# Patient Record
Sex: Female | Born: 1940 | Race: White | Hispanic: No | Smoking: Never smoker
Health system: Southern US, Community
[De-identification: ages and names within clinical notes are randomized; demographics above are authoritative.]

## PROBLEM LIST (undated history)

## (undated) DIAGNOSIS — J45991 Cough variant asthma: Secondary | ICD-10-CM

## (undated) DIAGNOSIS — IMO0002 Reserved for concepts with insufficient information to code with codable children: Secondary | ICD-10-CM

## (undated) DIAGNOSIS — I2510 Atherosclerotic heart disease of native coronary artery without angina pectoris: Secondary | ICD-10-CM

## (undated) DIAGNOSIS — L719 Rosacea, unspecified: Secondary | ICD-10-CM

## (undated) DIAGNOSIS — I10 Essential (primary) hypertension: Secondary | ICD-10-CM

## (undated) DIAGNOSIS — R011 Cardiac murmur, unspecified: Secondary | ICD-10-CM

## (undated) DIAGNOSIS — J309 Allergic rhinitis, unspecified: Secondary | ICD-10-CM

## (undated) DIAGNOSIS — D509 Iron deficiency anemia, unspecified: Secondary | ICD-10-CM

## (undated) DIAGNOSIS — E785 Hyperlipidemia, unspecified: Secondary | ICD-10-CM

## (undated) DIAGNOSIS — M1990 Unspecified osteoarthritis, unspecified site: Secondary | ICD-10-CM

## (undated) DIAGNOSIS — R51 Headache: Secondary | ICD-10-CM

## (undated) DIAGNOSIS — Z9889 Other specified postprocedural states: Secondary | ICD-10-CM

## (undated) DIAGNOSIS — M722 Plantar fascial fibromatosis: Secondary | ICD-10-CM

## (undated) DIAGNOSIS — I639 Cerebral infarction, unspecified: Secondary | ICD-10-CM

## (undated) DIAGNOSIS — J189 Pneumonia, unspecified organism: Secondary | ICD-10-CM

## (undated) DIAGNOSIS — N189 Chronic kidney disease, unspecified: Secondary | ICD-10-CM

## (undated) DIAGNOSIS — K219 Gastro-esophageal reflux disease without esophagitis: Secondary | ICD-10-CM

## (undated) DIAGNOSIS — R112 Nausea with vomiting, unspecified: Secondary | ICD-10-CM

## (undated) DIAGNOSIS — G4730 Sleep apnea, unspecified: Secondary | ICD-10-CM

## (undated) HISTORY — DX: Allergic rhinitis, unspecified: J30.9

## (undated) HISTORY — DX: Gastro-esophageal reflux disease without esophagitis: K21.9

## (undated) HISTORY — DX: Unspecified osteoarthritis, unspecified site: M19.90

## (undated) HISTORY — DX: Cough variant asthma: J45.991

## (undated) HISTORY — PX: MULTIPLE TOOTH EXTRACTIONS: SHX2053

## (undated) HISTORY — DX: Plantar fascial fibromatosis: M72.2

## (undated) HISTORY — PX: BLADDER REPAIR: SHX76

## (undated) HISTORY — PX: TONSILLECTOMY: SUR1361

## (undated) HISTORY — PX: BLADDER SUSPENSION: SHX72

## (undated) HISTORY — DX: Rosacea, unspecified: L71.9

---

## 1989-08-22 HISTORY — PX: ABDOMINAL HYSTERECTOMY: SHX81

## 2000-04-22 ENCOUNTER — Encounter: Admission: RE | Admit: 2000-04-22 | Discharge: 2000-04-22 | Payer: Self-pay | Admitting: Internal Medicine

## 2000-04-22 ENCOUNTER — Encounter: Payer: Self-pay | Admitting: Internal Medicine

## 2003-02-03 ENCOUNTER — Encounter: Payer: Self-pay | Admitting: Internal Medicine

## 2003-02-03 ENCOUNTER — Encounter: Admission: RE | Admit: 2003-02-03 | Discharge: 2003-02-03 | Payer: Self-pay | Admitting: Internal Medicine

## 2005-02-07 ENCOUNTER — Ambulatory Visit (HOSPITAL_COMMUNITY): Admission: RE | Admit: 2005-02-07 | Discharge: 2005-02-07 | Payer: Self-pay | Admitting: Gastroenterology

## 2008-09-28 ENCOUNTER — Other Ambulatory Visit: Admission: RE | Admit: 2008-09-28 | Discharge: 2008-09-28 | Payer: Self-pay | Admitting: Obstetrics and Gynecology

## 2012-11-05 ENCOUNTER — Emergency Department (HOSPITAL_COMMUNITY)
Admission: EM | Admit: 2012-11-05 | Discharge: 2012-11-06 | Disposition: A | Discharge status: Home / Self Care | Payer: Medicare Other | Attending: Emergency Medicine | Admitting: Emergency Medicine

## 2012-11-05 ENCOUNTER — Encounter (HOSPITAL_COMMUNITY): Payer: Self-pay | Admitting: Emergency Medicine

## 2012-11-05 DIAGNOSIS — M549 Dorsalgia, unspecified: Secondary | ICD-10-CM | POA: Insufficient documentation

## 2012-11-05 DIAGNOSIS — G4730 Sleep apnea, unspecified: Secondary | ICD-10-CM | POA: Insufficient documentation

## 2012-11-05 DIAGNOSIS — Z79899 Other long term (current) drug therapy: Secondary | ICD-10-CM | POA: Insufficient documentation

## 2012-11-05 DIAGNOSIS — I10 Essential (primary) hypertension: Secondary | ICD-10-CM | POA: Insufficient documentation

## 2012-11-05 DIAGNOSIS — E785 Hyperlipidemia, unspecified: Secondary | ICD-10-CM | POA: Insufficient documentation

## 2012-11-05 DIAGNOSIS — E860 Dehydration: Secondary | ICD-10-CM | POA: Insufficient documentation

## 2012-11-05 DIAGNOSIS — N23 Unspecified renal colic: Secondary | ICD-10-CM | POA: Insufficient documentation

## 2012-11-05 DIAGNOSIS — IMO0002 Reserved for concepts with insufficient information to code with codable children: Secondary | ICD-10-CM | POA: Insufficient documentation

## 2012-11-05 DIAGNOSIS — R112 Nausea with vomiting, unspecified: Secondary | ICD-10-CM | POA: Insufficient documentation

## 2012-11-05 DIAGNOSIS — N201 Calculus of ureter: Secondary | ICD-10-CM | POA: Insufficient documentation

## 2012-11-05 HISTORY — DX: Reserved for concepts with insufficient information to code with codable children: IMO0002

## 2012-11-05 HISTORY — DX: Hyperlipidemia, unspecified: E78.5

## 2012-11-05 HISTORY — DX: Sleep apnea, unspecified: G47.30

## 2012-11-05 HISTORY — DX: Essential (primary) hypertension: I10

## 2012-11-05 LAB — URINALYSIS, MICROSCOPIC ONLY
Bilirubin Urine: NEGATIVE
Ketones, ur: NEGATIVE mg/dL
Nitrite: NEGATIVE
pH: 6.5 (ref 5.0–8.0)

## 2012-11-05 LAB — COMPREHENSIVE METABOLIC PANEL
ALT: 25 U/L (ref 0–35)
Alkaline Phosphatase: 50 U/L (ref 39–117)
BUN: 30 mg/dL — ABNORMAL HIGH (ref 6–23)
Chloride: 106 mEq/L (ref 96–112)
GFR calc Af Amer: 52 mL/min — ABNORMAL LOW (ref >90)
Glucose, Bld: 151 mg/dL — ABNORMAL HIGH (ref 70–99)
Potassium: 4.4 mEq/L (ref 3.5–5.1)
Total Bilirubin: 0.3 mg/dL (ref 0.3–1.2)

## 2012-11-05 LAB — CBC WITH DIFFERENTIAL/PLATELET
Hemoglobin: 13.8 g/dL (ref 12.0–15.0)
Lymphocytes Relative: 10 % — ABNORMAL LOW (ref 12–46)
Lymphs Abs: 1.2 10*3/uL (ref 0.7–4.0)
Monocytes Relative: 3 % (ref 3–12)
Neutro Abs: 11.2 10*3/uL — ABNORMAL HIGH (ref 1.7–7.7)
Neutrophils Relative %: 87 % — ABNORMAL HIGH (ref 43–77)
RBC: 4.55 MIL/uL (ref 3.87–5.11)
WBC: 12.9 10*3/uL — ABNORMAL HIGH (ref 4.0–10.5)

## 2012-11-05 LAB — LIPASE, BLOOD: Lipase: 33 U/L (ref 11–59)

## 2012-11-05 MED ORDER — SODIUM CHLORIDE 0.9 % IV BOLUS (SEPSIS)
1000.0000 mL | Freq: Once | INTRAVENOUS | Status: AC
  Administered 2012-11-05: 1000 mL via INTRAVENOUS

## 2012-11-05 MED ORDER — IOHEXOL 300 MG/ML  SOLN
20.0000 mL | INTRAMUSCULAR | Status: AC
  Administered 2012-11-05: 20 mL via ORAL

## 2012-11-05 MED ORDER — FENTANYL CITRATE 0.05 MG/ML IJ SOLN
50.0000 ug | Freq: Once | INTRAMUSCULAR | Status: AC
  Administered 2012-11-06: 50 ug via INTRAVENOUS
  Filled 2012-11-05: qty 2

## 2012-11-05 MED ORDER — FENTANYL CITRATE 0.05 MG/ML IJ SOLN
50.0000 ug | Freq: Once | INTRAMUSCULAR | Status: AC
  Administered 2012-11-05: 50 ug via INTRAVENOUS
  Filled 2012-11-05: qty 2

## 2012-11-05 MED ORDER — SODIUM CHLORIDE 0.9 % IV SOLN
Freq: Once | INTRAVENOUS | Status: AC
  Administered 2012-11-06: 20 mL/h via INTRAVENOUS

## 2012-11-05 MED ORDER — ONDANSETRON 4 MG PO TBDP
8.0000 mg | ORAL_TABLET | Freq: Once | ORAL | Status: AC
  Administered 2012-11-05: 8 mg via ORAL
  Filled 2012-11-05: qty 1
  Filled 2012-11-05: qty 2

## 2012-11-05 NOTE — ED Provider Notes (Signed)
History     CSN: [id number]  Arrival date & time 11/05/12  2024   First MD Initiated Contact with Patient 11/05/12 2256      Chief Complaint  Patient presents with  . Abdominal Pain     Patient is a 71 y.o. female presenting with abdominal pain. The history is provided by the patient.  Abdominal Pain The primary symptoms of the illness include abdominal pain, nausea and vomiting. The primary symptoms of the illness do not include fever, shortness of breath, diarrhea or dysuria. The current episode started 6 to 12 hours ago. The onset of the illness was gradual. The problem has been gradually worsening.  Additional symptoms associated with the illness include back pain.  PT presents for diffuse abdominal pain that started earlier today.  She has not experienced this previously.  She reports nausea/vomiting.  No active CP.  She reports back pain.  No dysuria.  No focal weakness.  She reports she passed a small bowel movement earlier today   Past Medical History  Diagnosis Date  . Degenerative disc disease   . Sleep apnea   . Hypertension   . Hyperlipemia     Past Surgical History  Procedure Date  . Abdominal hysterectomy   . Bladder repair     History reviewed. No pertinent family history.  History  Substance Use Topics  . Smoking status: Never Smoker   . Smokeless tobacco: Not on file  . Alcohol Use: No    OB History    Grav Para Term Preterm Abortions TAB SAB Ect Mult Living                  Review of Systems  Constitutional: Negative for fever.  Respiratory: Negative for shortness of breath.   Gastrointestinal: Positive for nausea, vomiting and abdominal pain. Negative for diarrhea.  Genitourinary: Negative for dysuria.  Musculoskeletal: Positive for back pain.  Neurological: Negative for weakness.  All other systems reviewed and are negative.    Allergies  Sulfa antibiotics  Home Medications   Current Outpatient Rx  Name  Route  Sig  Dispense   Refill  . CALCIUM 600 PO   Oral   Take 1 tablet by mouth daily.         Marland Kitchen CETIRIZINE HCL 10 MG PO TABS   Oral   Take 10 mg by mouth daily.         Marland Kitchen VITAMIN D3 PO   Oral   Take 800 Units by mouth daily.         . FENOFIBRATE 48 MG PO TABS   Oral   Take 48 mg by mouth daily.         . ADULT MULTIVITAMIN W/MINERALS CH   Oral   Take 1 tablet by mouth daily.         Marland Kitchen NAPROXEN SODIUM 220 MG PO TABS   Oral   Take 440 mg by mouth 2 (two) times daily with a meal.         . GNP TRIPLE OMEGA COMPLEX PO CPDR   Oral   Take 1 tablet by mouth daily.         Marland Kitchen OVER THE COUNTER MEDICATION   Oral   Take 1 tablet by mouth daily. OTC Mega D-3         . OVER THE COUNTER MEDICATION   Oral   Take 1 tablet by mouth daily. Osteo Bi-Flex         .  OVER THE COUNTER MEDICATION   Oral   Take 1 tablet by mouth daily. [employer] Joint vita pack         . SIMVASTATIN 40 MG PO TABS   Oral   Take 40 mg by mouth daily.         Marland Kitchen VITAMIN A PALMITATE 10000 UNITS PO CAPS   Oral   Take 10,000 Units by mouth daily.           BP 191/82  Pulse 88  Temp 98.3 F (36.8 C) (Oral)  Resp 22  SpO2 98% BP 153/66  Pulse 73  Temp 98.3 F (36.8 C) (Oral)  Resp 16  SpO2 100%  Physical Exam CONSTITUTIONAL: Well developed/well nourished HEAD AND FACE: Normocephalic/atraumatic EYES: EOMI/PERRL ENMT: Mucous membranes moist NECK: supple no meningeal signs SPINE:entire spine nontender CV: S1/S2 noted, no murmurs/rubs/gallops noted LUNGS: Lungs are clear to auscultation bilaterally, no apparent distress ABDOMEN: soft, distended, diffuse tenderness, decreased BS throughout, tenderness is moderate GU:no cva tenderness NEURO: Pt is awake/alert, moves all extremitiesx4 EXTREMITIES: pulses normal, full ROM SKIN: warm, color normal PSYCH: no abnormalities of mood noted  ED Course  Procedures   Labs Reviewed  CBC WITH DIFFERENTIAL - Abnormal; Notable for the following:    WBC 12.9  (*)     Neutrophils Relative 87 (*)     Neutro Abs 11.2 (*)     Lymphocytes Relative 10 (*)     All other components within normal limits  COMPREHENSIVE METABOLIC PANEL - Abnormal; Notable for the following:    Glucose, Bld 151 (*)     BUN 30 (*)     Creatinine, Ser 1.20 (*)     GFR calc non Af Amer 45 (*)     GFR calc Af Amer 52 (*)     All other components within normal limits  URINALYSIS, MICROSCOPIC ONLY - Abnormal; Notable for the following:    Hgb urine dipstick LARGE (*)     Protein, ur 30 (*)     Leukocytes, UA TRACE (*)     All other components within normal limits  LIPASE, BLOOD   11:18 PM Pt presents for diffuse abdominal pain today, will obtain CT imaging 2:19 AM Pt improved.  Ct imaging notes distal 5mm stone. She feels improved and will be discharged home.  Discussed need for followup.  Pt agreeable with plan  MDM  Nursing notes including past medical history and social history reviewed and considered in documentation Labs/vital reviewed and considered        Date: 11/05/2012  Rate: 87  Rhythm: normal sinus rhythm  QRS Axis: normal  Intervals: normal  ST/T Wave abnormalities: nonspecific ST changes  Conduction Disutrbances:none     Joya Gaskins, MD 11/06/12 [phone]

## 2012-11-05 NOTE — ED Notes (Addendum)
Lisa [name] (brother) [phone] (home) and [phone] (mobile) Lisa [name] (mother) [phone] (home) Pt sent wallet home with her mother.

## 2012-11-05 NOTE — ED Notes (Addendum)
Pt states she has abdominal pain since noon today.  Pt. Has N/V but denies diarrhea.  Pain radiates back and sides bilaterally.  Pt. Went to [facility] in clinic on [address] and received a shot of what she thinks was phenergan and referred her to come here because of increased pain. No urinary changes/ denies pain or burning with urination.  Because of urinary sling, pt. Always has urinary frequency/no changes in color/odor of urine.

## 2012-11-06 ENCOUNTER — Emergency Department (HOSPITAL_COMMUNITY): Payer: Medicare Other

## 2012-11-06 ENCOUNTER — Encounter (HOSPITAL_COMMUNITY): Payer: Self-pay | Admitting: Radiology

## 2012-11-06 IMAGING — CT CT ABD-PELV W/ CM
2 of 5 series · 14 of 32 positions shown, 19 images · IV contrast (omnipaque)
Comparison: None.

CLINICAL DATA: Abdominal pain

CT ABDOMEN AND PELVIS WITH CONTRAST
TECHNIQUE: Multidetector CT imaging of the abdomen and pelvis was
performed following the standard protocol during bolus
administration of intravenous contrast.
Contrast: 80mL OMNIPAQUE IOHEXOL 300 MG/ML  SOLN

[Series 2: routine abdomen · axial · 0.81mm/px · z∈[-420,-90]mm · 6 of 94 slices shown, 11 images]
[im 14/94  soft-tissue]
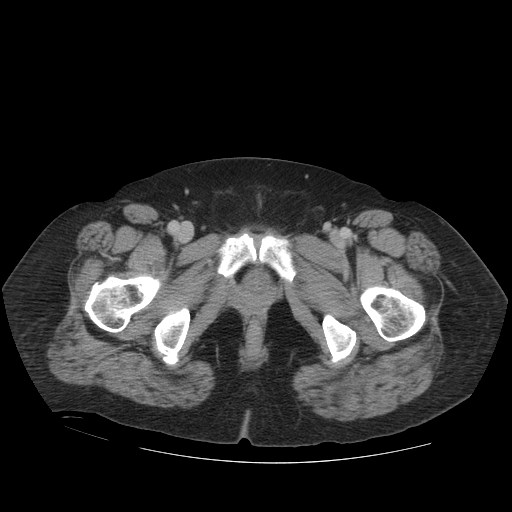
[im 14/94  bone]
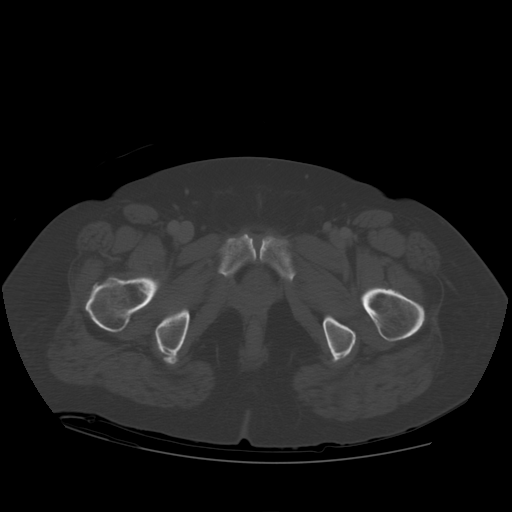
[im 27/94  soft-tissue]
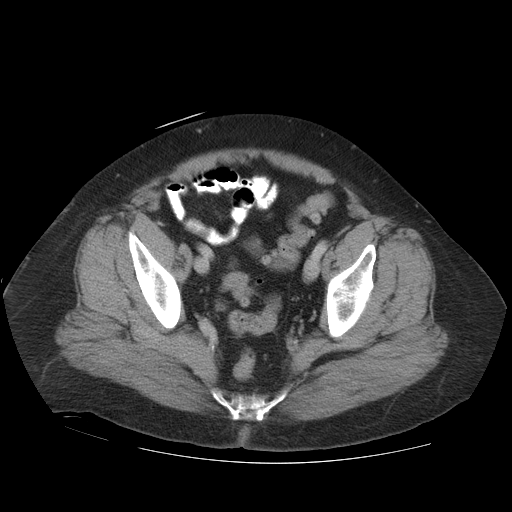
[im 40/94  soft-tissue]
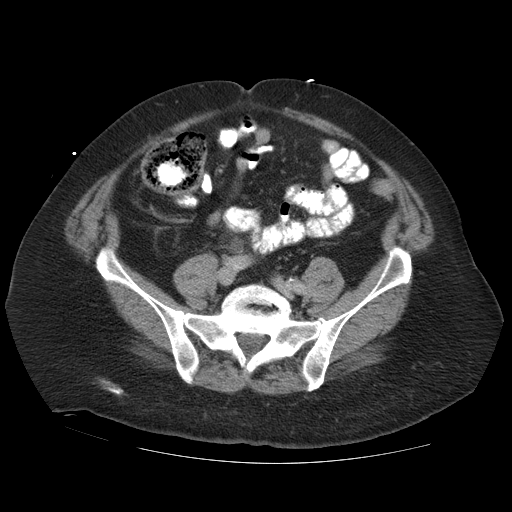
[im 40/94  lung]
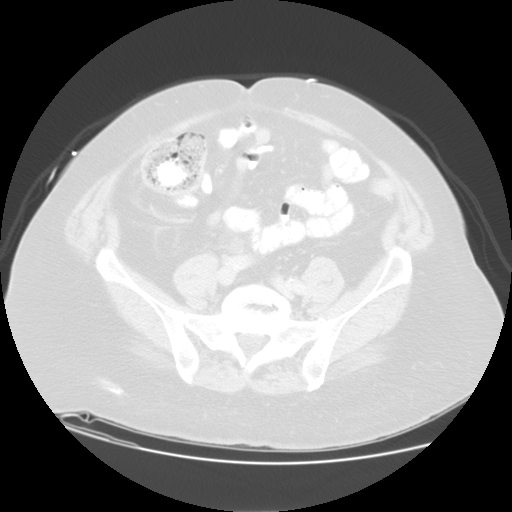
[im 54/94  soft-tissue]
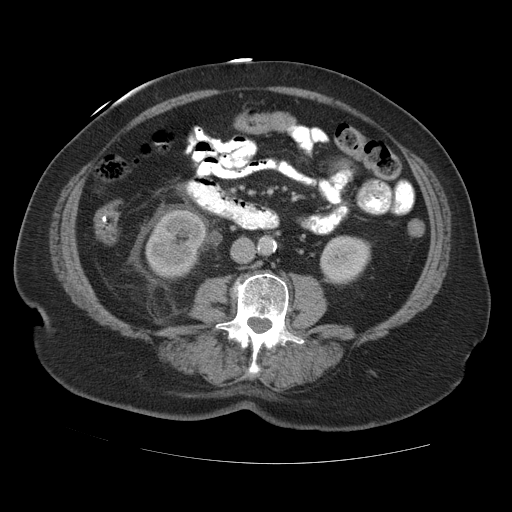
[im 54/94  lung]
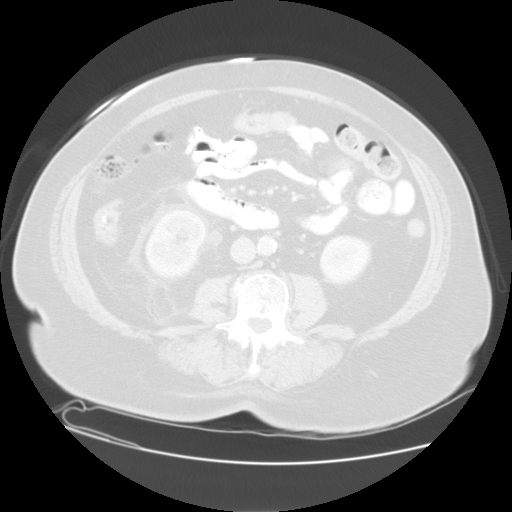
[im 67/94  soft-tissue]
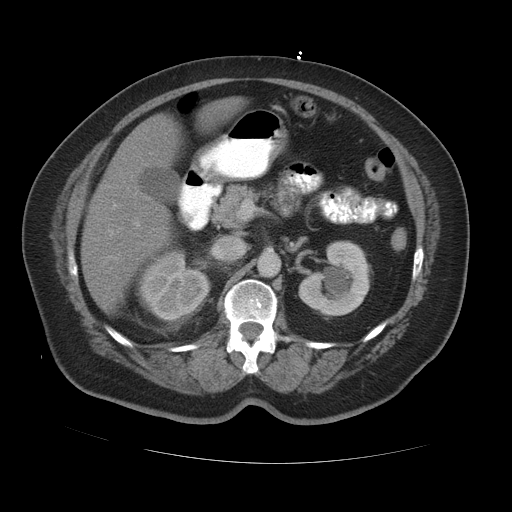
[im 67/94  lung]
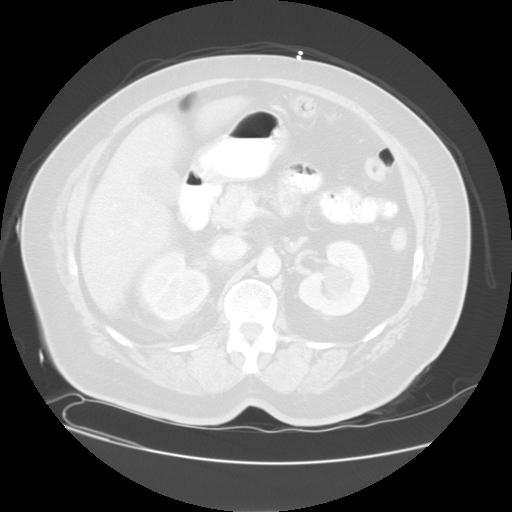
[im 80/94  soft-tissue]
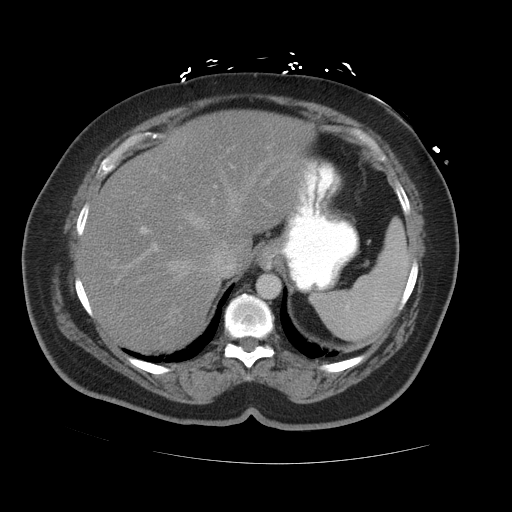
[im 80/94  lung]
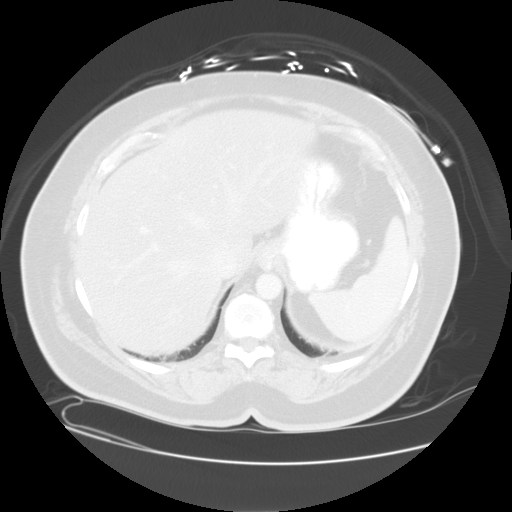

[Series 400: sag · sagittal · 0.93mm/px · 8 of 125 slices shown]
[im 14/125  soft-tissue]
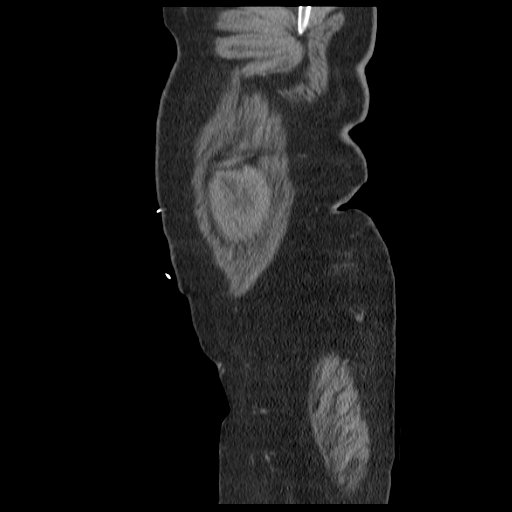
[im 28/125  soft-tissue]
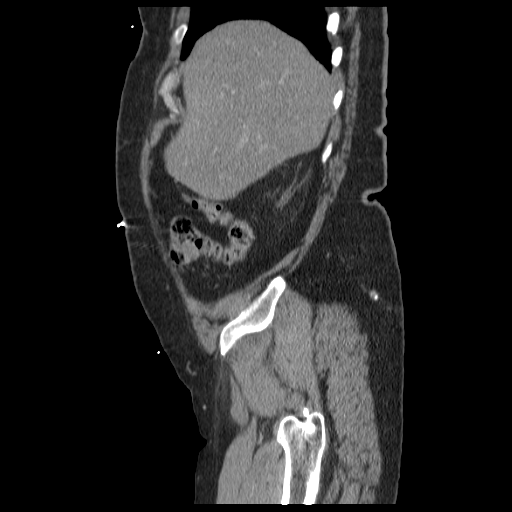
[im 42/125  soft-tissue]
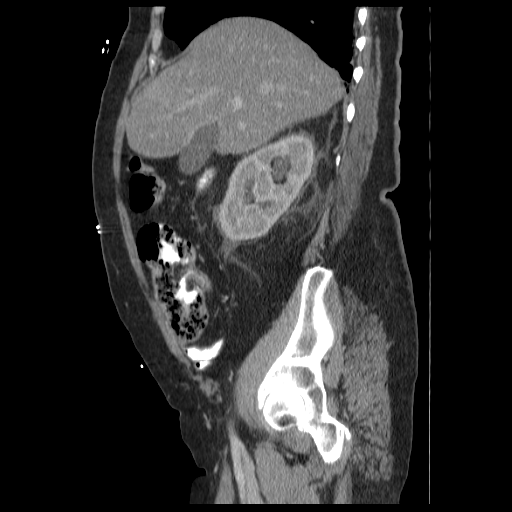
[im 56/125  soft-tissue]
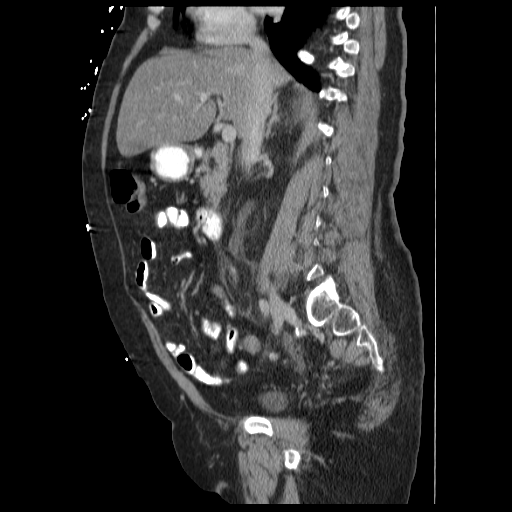
[im 69/125  soft-tissue]
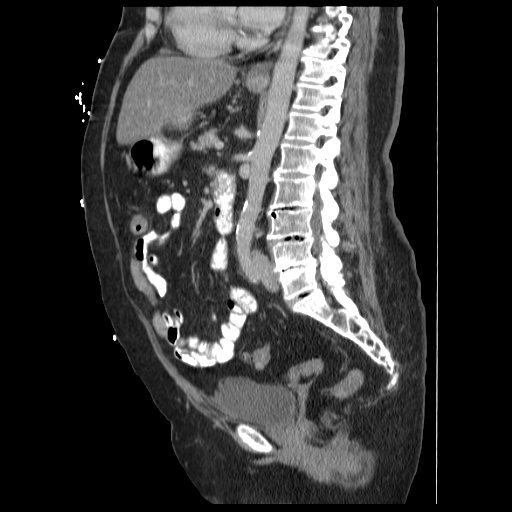
[im 83/125  soft-tissue]
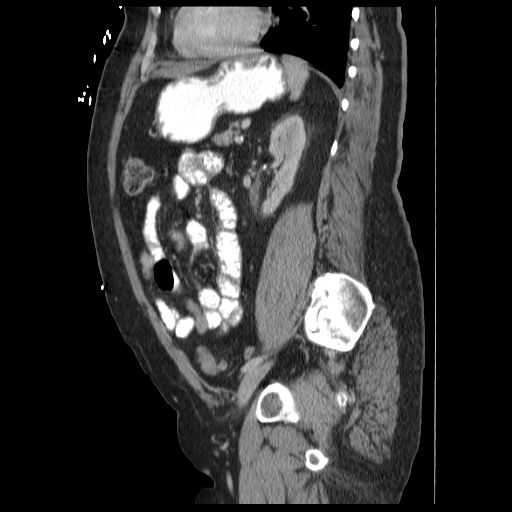
[im 97/125  soft-tissue]
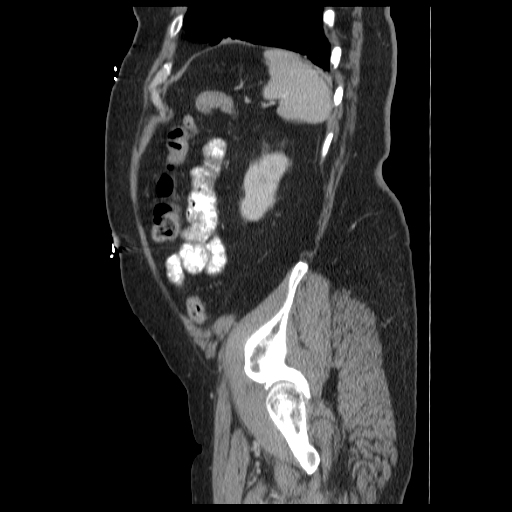
[im 111/125  soft-tissue]
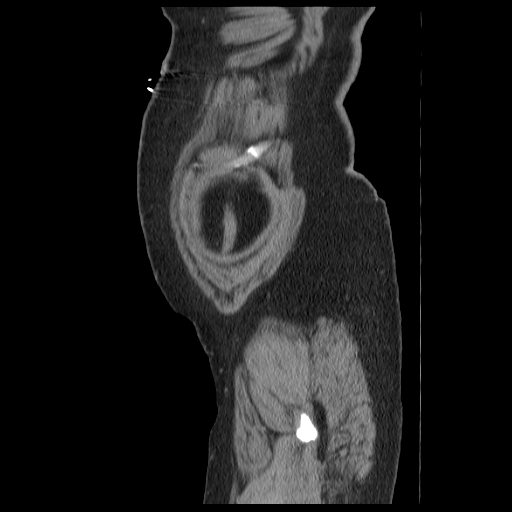

[14 of 32 positions shown; findings below may reference images not displayed]

FINDINGS: Mild bibasilar scarring versus atelectasis.  Heart size
within normal limits.  No pleural or pericardial effusion.

Diffuse low attenuation of the liver.  Unremarkable biliary system,
spleen, pancreas, adrenal glands.

There is right renal edema and perinephric/periureteral fat
stranding.  Moderate hydroureteronephrosis to the level of a 5 mm
stone at or just past the UVJ, within the bladder.

There is upper pole caliectasis on the left, to the level of a 12
mm stone.  Additional nonobstructing stone within the upper pole on
the left.

No bowel obstruction.  Colonic diverticulosis.  No CT evidence for
diverticulitis or colitis.  Appendix not identified.  No free
intraperitoneal air or fluid.

There is scattered atherosclerotic calcification of the aorta and
its branches. No aneurysmal dilatation.

Thin-walled bladder. Absent uterus.  No adnexal mass.  No pelvic
lymphadenopathy.

Multilevel degenerative changes of the imaged spine. No acute or
aggressive appearing osseous lesion.  Severe central canal
narrowing at L4-5.
IMPRESSION: Moderate right hydroureteronephrosis to the level of a 5 mm stone
at the UVJ or just past the UVJ, within the bladder.

Moderate upper pole caliectasis on the left to the level of a 12 mm
pelvic stone.

Additional nonobstructing upper pole stone on the left.

Colonic diverticulosis.

Hepatic steatosis.

## 2012-11-06 MED ORDER — ONDANSETRON 8 MG PO TBDP: 8.0000 mg | ORAL_TABLET | Freq: Three times a day (TID) | ORAL | Status: DC | PRN

## 2012-11-06 MED ORDER — OXYCODONE-ACETAMINOPHEN 5-325 MG PO TABS: 1.0000 | ORAL_TABLET | ORAL | Status: DC | PRN

## 2012-11-06 MED ORDER — IOHEXOL 300 MG/ML  SOLN
80.0000 mL | Freq: Once | INTRAMUSCULAR | Status: AC | PRN
  Administered 2012-11-06: 80 mL via INTRAVENOUS

## 2012-11-06 NOTE — ED Notes (Signed)
Called CDU to advised that patient has finished contrast

## 2012-11-06 NOTE — ED Notes (Signed)
Pt denies nausea and vomiting at this time / pt denies having diarrhea

## 2012-11-06 NOTE — ED Notes (Signed)
  Report to CDU Rn and going to Rm 5

## 2012-11-10 ENCOUNTER — Other Ambulatory Visit: Payer: Self-pay | Admitting: Urology

## 2012-11-12 ENCOUNTER — Encounter (HOSPITAL_COMMUNITY): Payer: Self-pay | Admitting: *Deleted

## 2012-11-12 NOTE — Pre-Procedure Instructions (Signed)
Asked to bring blue folder the day of the procedure,insurance card,I.D. driver's license,wear comfortable clothing and have a driver for the day. Asked not to take Advil,Motrin,Ibuprofen,Aleve or any NSAIDS, Aspirin, or Toradol for 72 hours prior to procedure,  No vitamins or herbal medications 7 days prior to procedure. Stopping Napaxen and all vitamins. Instructed to take laxative per doctor's office instructions and eat a light dinner the evening before procedure.   To arrive at 1415 for lithotripsy procedure.  Bringing cpap with mask and tubing.

## 2012-11-14 MED ORDER — CIPROFLOXACIN HCL 500 MG PO TABS
500.0000 mg | ORAL_TABLET | ORAL | Status: AC
  Administered 2012-11-15: 500 mg via ORAL
  Filled 2012-11-14: qty 1

## 2012-11-15 ENCOUNTER — Encounter (HOSPITAL_COMMUNITY)
Admission: RE | Disposition: A | Discharge status: Home / Self Care | Payer: Self-pay | Source: Ambulatory Visit | Attending: Urology

## 2012-11-15 ENCOUNTER — Ambulatory Visit (HOSPITAL_COMMUNITY)
Admission: RE | Admit: 2012-11-15 | Discharge: 2012-11-15 | Disposition: A | Discharge status: Home / Self Care | Payer: Medicare Other | Source: Ambulatory Visit | Attending: Urology | Admitting: Urology

## 2012-11-15 ENCOUNTER — Encounter (HOSPITAL_COMMUNITY): Payer: Self-pay | Admitting: *Deleted

## 2012-11-15 DIAGNOSIS — N200 Calculus of kidney: Secondary | ICD-10-CM | POA: Insufficient documentation

## 2012-11-15 DIAGNOSIS — N1330 Unspecified hydronephrosis: Secondary | ICD-10-CM | POA: Insufficient documentation

## 2012-11-15 DIAGNOSIS — Z7901 Long term (current) use of anticoagulants: Secondary | ICD-10-CM | POA: Insufficient documentation

## 2012-11-15 DIAGNOSIS — K219 Gastro-esophageal reflux disease without esophagitis: Secondary | ICD-10-CM | POA: Insufficient documentation

## 2012-11-15 DIAGNOSIS — I10 Essential (primary) hypertension: Secondary | ICD-10-CM | POA: Insufficient documentation

## 2012-11-15 DIAGNOSIS — J45909 Unspecified asthma, uncomplicated: Secondary | ICD-10-CM | POA: Insufficient documentation

## 2012-11-15 DIAGNOSIS — G4730 Sleep apnea, unspecified: Secondary | ICD-10-CM | POA: Insufficient documentation

## 2012-11-15 DIAGNOSIS — E785 Hyperlipidemia, unspecified: Secondary | ICD-10-CM | POA: Insufficient documentation

## 2012-11-15 HISTORY — DX: Other specified postprocedural states: Z98.890

## 2012-11-15 HISTORY — DX: Chronic kidney disease, unspecified: N18.9

## 2012-11-15 HISTORY — DX: Nausea with vomiting, unspecified: R11.2

## 2012-11-15 SURGERY — LITHOTRIPSY, ESWL
Anesthesia: LOCAL | Laterality: Left

## 2012-11-15 MED ORDER — DEXTROSE-NACL 5-0.45 % IV SOLN
INTRAVENOUS | Status: DC
  Administered 2012-11-15: 16:00:00 via INTRAVENOUS

## 2012-11-15 MED ORDER — DIAZEPAM 5 MG PO TABS
10.0000 mg | ORAL_TABLET | ORAL | Status: AC
  Administered 2012-11-15: 10 mg via ORAL
  Filled 2012-11-15: qty 2

## 2012-11-15 MED ORDER — ONDANSETRON HCL 4 MG/2ML IJ SOLN
4.0000 mg | Freq: Once | INTRAMUSCULAR | Status: AC
  Administered 2012-11-15: 4 mg via INTRAVENOUS
  Filled 2012-11-15: qty 2

## 2012-11-15 MED ORDER — DIPHENHYDRAMINE HCL 25 MG PO CAPS
25.0000 mg | ORAL_CAPSULE | ORAL | Status: AC
  Administered 2012-11-15: 25 mg via ORAL
  Filled 2012-11-15: qty 1

## 2012-11-15 MED ORDER — PROMETHAZINE HCL 25 MG/ML IJ SOLN
6.2500 mg | Freq: Four times a day (QID) | INTRAMUSCULAR | Status: DC | PRN
  Administered 2012-11-15: 6.25 mg via INTRAVENOUS
  Filled 2012-11-15: qty 1

## 2012-11-15 NOTE — Interval H&P Note (Signed)
History and Physical Interval Note:  11/15/2012 5:00 PM  [name]  has presented today for surgery, with the diagnosis of Left renal pelvic stone  The various methods of treatment have been discussed with the patient and family. After consideration of risks, benefits and other options for treatment, the patient has consented to  Procedure(s) (LRB) with comments: EXTRACORPOREAL SHOCK WAVE LITHOTRIPSY (ESWL) (Left) as a surgical intervention .  The patient's history has been reviewed, patient examined, no change in status, stable for surgery.  I have reviewed the patient's chart and labs.  Questions were answered to the patient's satisfaction.     Lisa [name] I

## 2012-11-15 NOTE — Progress Notes (Signed)
Patient denies using any advil, ibuprofen, toradol or any blood thinners for the last 72 hours.

## 2012-11-15 NOTE — H&P (Signed)
cc:  Lisa [name]   Reason For Visit  Kidney stone   Active Problems Problems  1. 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  Eventual R flank pain. CT scan showed a 5mm Rt UVJ stone that was possible already within the bladder.  She has not seen any stone pass in her urine.     She is now asymptomatic, except for slight nausea with fatigue. This is her 1st stone episode. She was drinking 1 soda/day, but is now drinking 1 qod. She is a retired Agricultural consultant.   Past Medical History Problems  1. History of  Adult Sleep Apnea 780.57 2. History of  Arthritis V13.4 3. History of  Asthma 493.90 4. History of  Depression 311 5. History of  Esophageal Reflux 530.81 6. History of  Hyperlipidemia 272.4 7. History of  Hypertension 401.9  Surgical History Problems  1. History of  Bladder Surgery 2. History of  Total Abdominal Hysterectomy V45.77  Current Meds 1. Calcium 600 TABS; Therapy: (Recorded:20Nov2013) to 2. Cetirizine HCl 10 MG Oral Tablet; Therapy: (Recorded:20Nov2013) to 3. Fenofibrate 48 MG Oral Tablet; Therapy: (Recorded:20Nov2013) to 4. Multi-Vitamin TABS; Therapy: (Recorded:20Nov2013) to 5. Naproxen Sodium 220 MG Oral Tablet; Therapy: (Recorded:20Nov2013) to 6. Omega-3 CAPS; Therapy: (Recorded:20Nov2013) to 7. Simvastatin 40 MG Oral Tablet; Therapy: (Recorded:20Nov2013) to 8. Vitamin A 04540 UNIT Oral Capsule; Therapy: (Recorded:20Nov2013) to 9. Vitamin D3 TABS; Therapy: (Recorded:20Nov2013) to  Allergies Medication  1. Sulfa Drugs  Family History Problems  1. Paternal history of  Acute Myocardial Infarction V17.3 2. Family history of  Family Health Status - Mother's Age 50. Family history of  Family Health Status Number Of Children 1 daughter 4. Family history of  Father Deceased At Age  3 heart attack  Social History Problems    Caffeine Use 1 per day   Marital History - Single   Never A Smoker   Occupation: Warden/ranger Denied    History of  Alcohol Use  Review of Systems Genitourinary, constitutional, skin, eye, otolaryngeal, hematologic/lymphatic, cardiovascular, pulmonary, endocrine, musculoskeletal, gastrointestinal, neurological and psychiatric system(s) were reviewed and pertinent findings if present are noted.  Genitourinary: urinary frequency, urinary urgency and nocturia.  Gastrointestinal: nausea, vomiting and abdominal pain.  Musculoskeletal: back pain.  Neurological: headache.    Vitals Vital Signs [Data Includes: Last 1 Day]  20Nov2013 08:55AM  BMI Calculated: 33.49 BSA Calculated: 1.83 Height: 5 ft 2 in Weight: 182 lb  Blood Pressure: 169 / 77 Heart Rate: 70  Physical Exam Constitutional: Well nourished and well developed . No acute distress. The patient appears well hydrated.  ENT:. The ears and nose are normal in appearance.  Neck: The appearance of the neck is normal and no neck mass is present.  Pulmonary: No respiratory distress and normal respiratory rhythm and effort.  Cardiovascular:. No peripheral edema.  Abdomen: The abdomen is obese, but not distended. The abdomen is soft and nontender. No masses are palpated. The abdomen is normal to percussion. The abdomen is not firm. Mild suprapubic tenderness is present and tenderness in the RLQ is present. Tenderness is present diffusely throughout the abdomen. mild right CVA tenderness. Bowel sounds are normal. No hepatosplenomegaly noted.  Lymphatics: The femoral and inguinal nodes are not enlarged or tender.  Skin: Normal skin turgor, no visible rash  and no visible skin lesions.  Neuro/Psych:. Mood and affect are appropriate.    Results/Data Urine [Data Includes: Last 1 Day]   20Nov2013  COLOR YELLOW   APPEARANCE CLEAR   SPECIFIC GRAVITY 1.025   pH 6.5   GLUCOSE NEG mg/dL    BILIRUBIN NEG   KETONE NEG mg/dL  BLOOD NEG   PROTEIN NEG mg/dL  UROBILINOGEN 0.2 mg/dL  NITRITE NEG   LEUKOCYTE ESTERASE NEG     1. Review of [facility] from [facility]: 6mm L u-v junction stone. Mild-mod proximal hydronephrosis. Also 12 mm L renal pelvic stone with obstruction of LUP calyx with hydronephrosis. This remains asymptomatic. Mod-severe DJD and multi-level lumbo-sacral disease.   Assessment Assessed  1. Distal Ureteral Stone On The Right 592.1 2. 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 No stone seen in R lower ureter. She has leftover Ba++ from contrasted CT.  CT stone protocol shows L upper pole calyx obstruction from 11mm stone in her L renal pelvis. Seen on KUB. Marland Kitchen She needs  scheduling of lithotripsy of L renal pelvic stone.   Plan  1. KUB today 2. Lithotripsy of L renal pelvic stone ( 12mm)   Signatures Electronically signed by : Jethro Bolus, M.D.; Nov 10 2012 12:04PM

## 2012-11-15 NOTE — Progress Notes (Addendum)
Paged Nurse Practitioner Marcell Barlow about nausea. Pt had 4 mg of zofran on mobile lithotripsy unit. Patient is very nauseated without vomiting.   1954  Orders received from Lujean Rave NP.

## 2013-01-27 IMAGING — CR DG ABDOMEN 1V
2 series · 2 of 2 positions shown · non-contrast
Comparison: CT [DATE]

CLINICAL DATA: Left kidney stones.  Lithotripsy.

ABDOMEN - 1 VIEW

[t abdomen supine (1 of 2)]
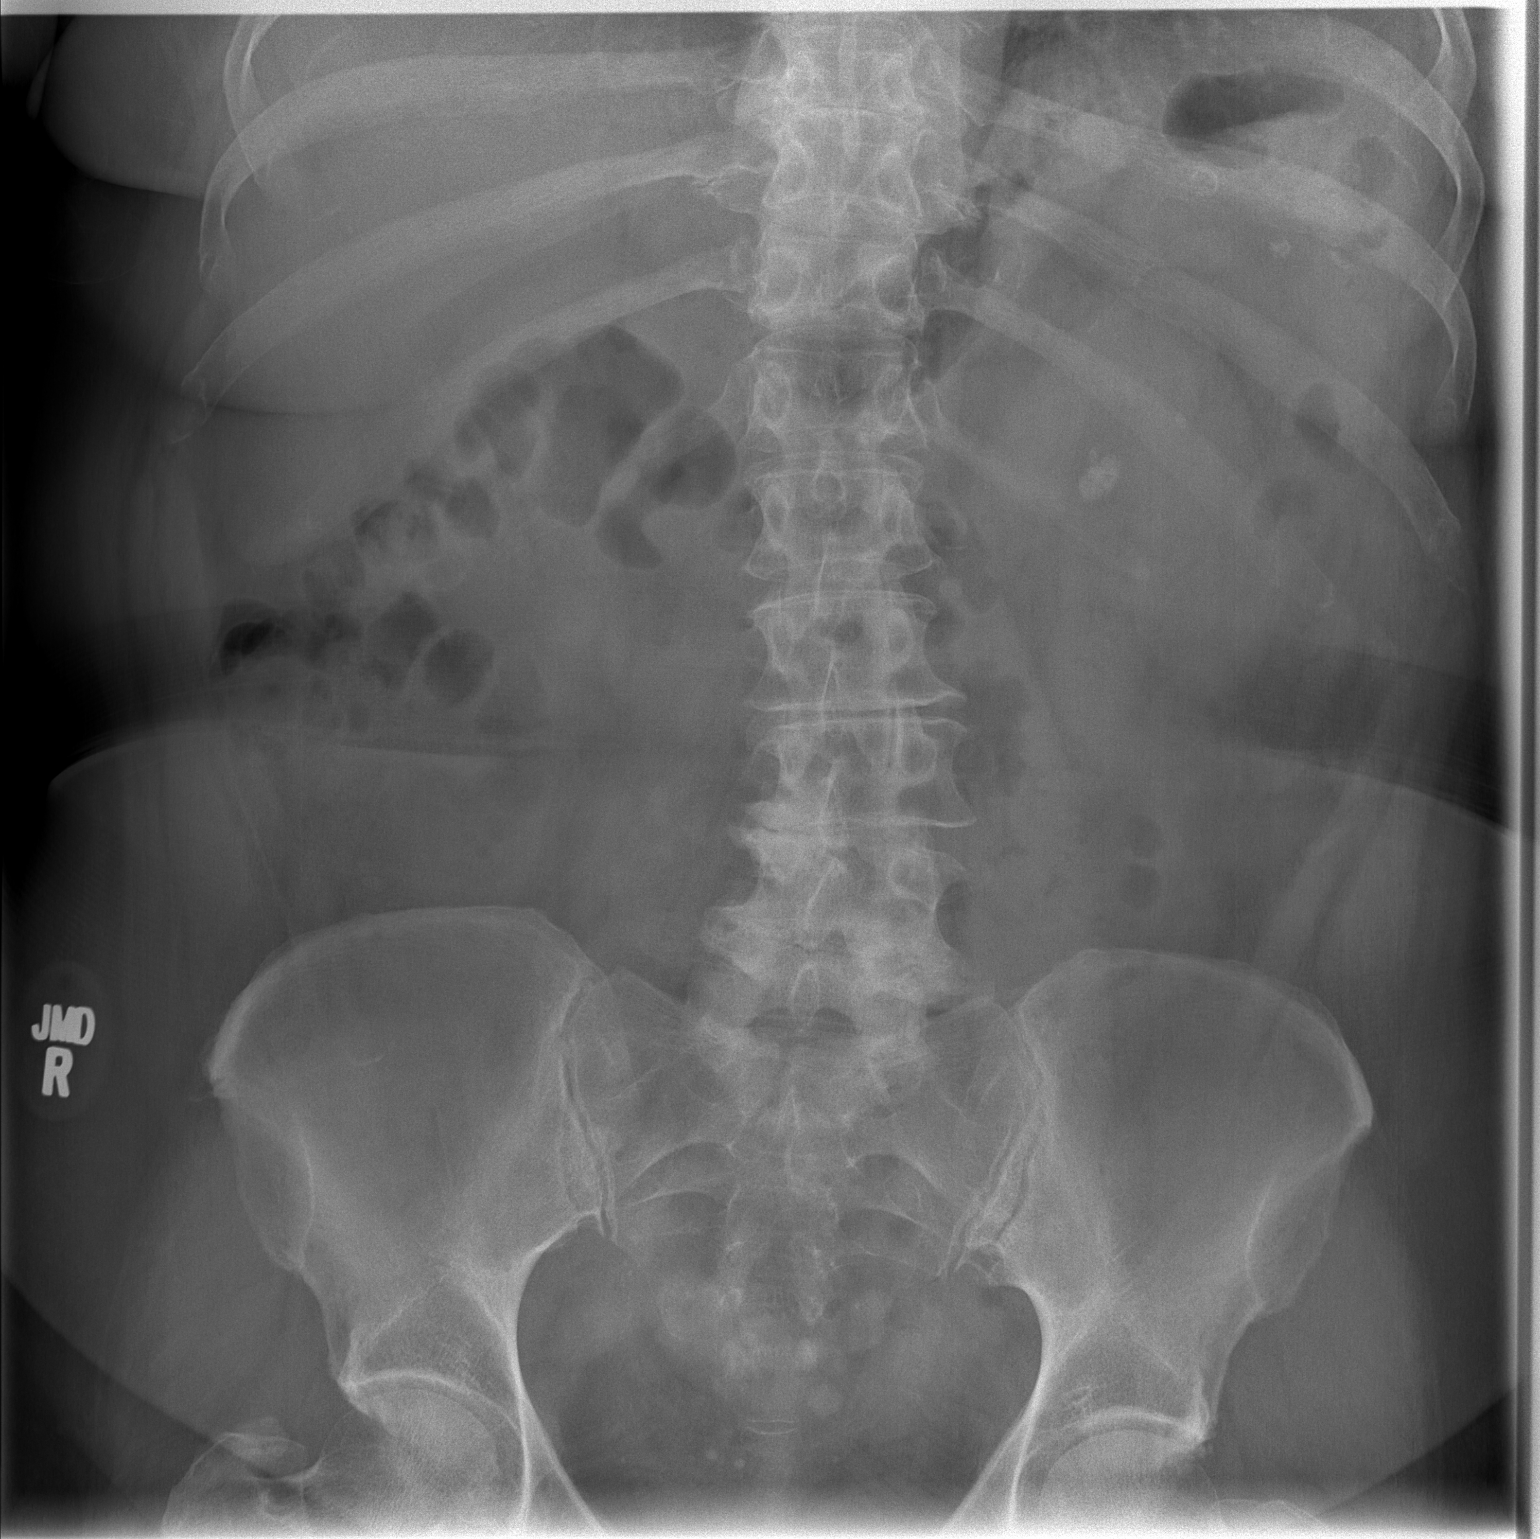

[t abdomen supine (2 of 2)]
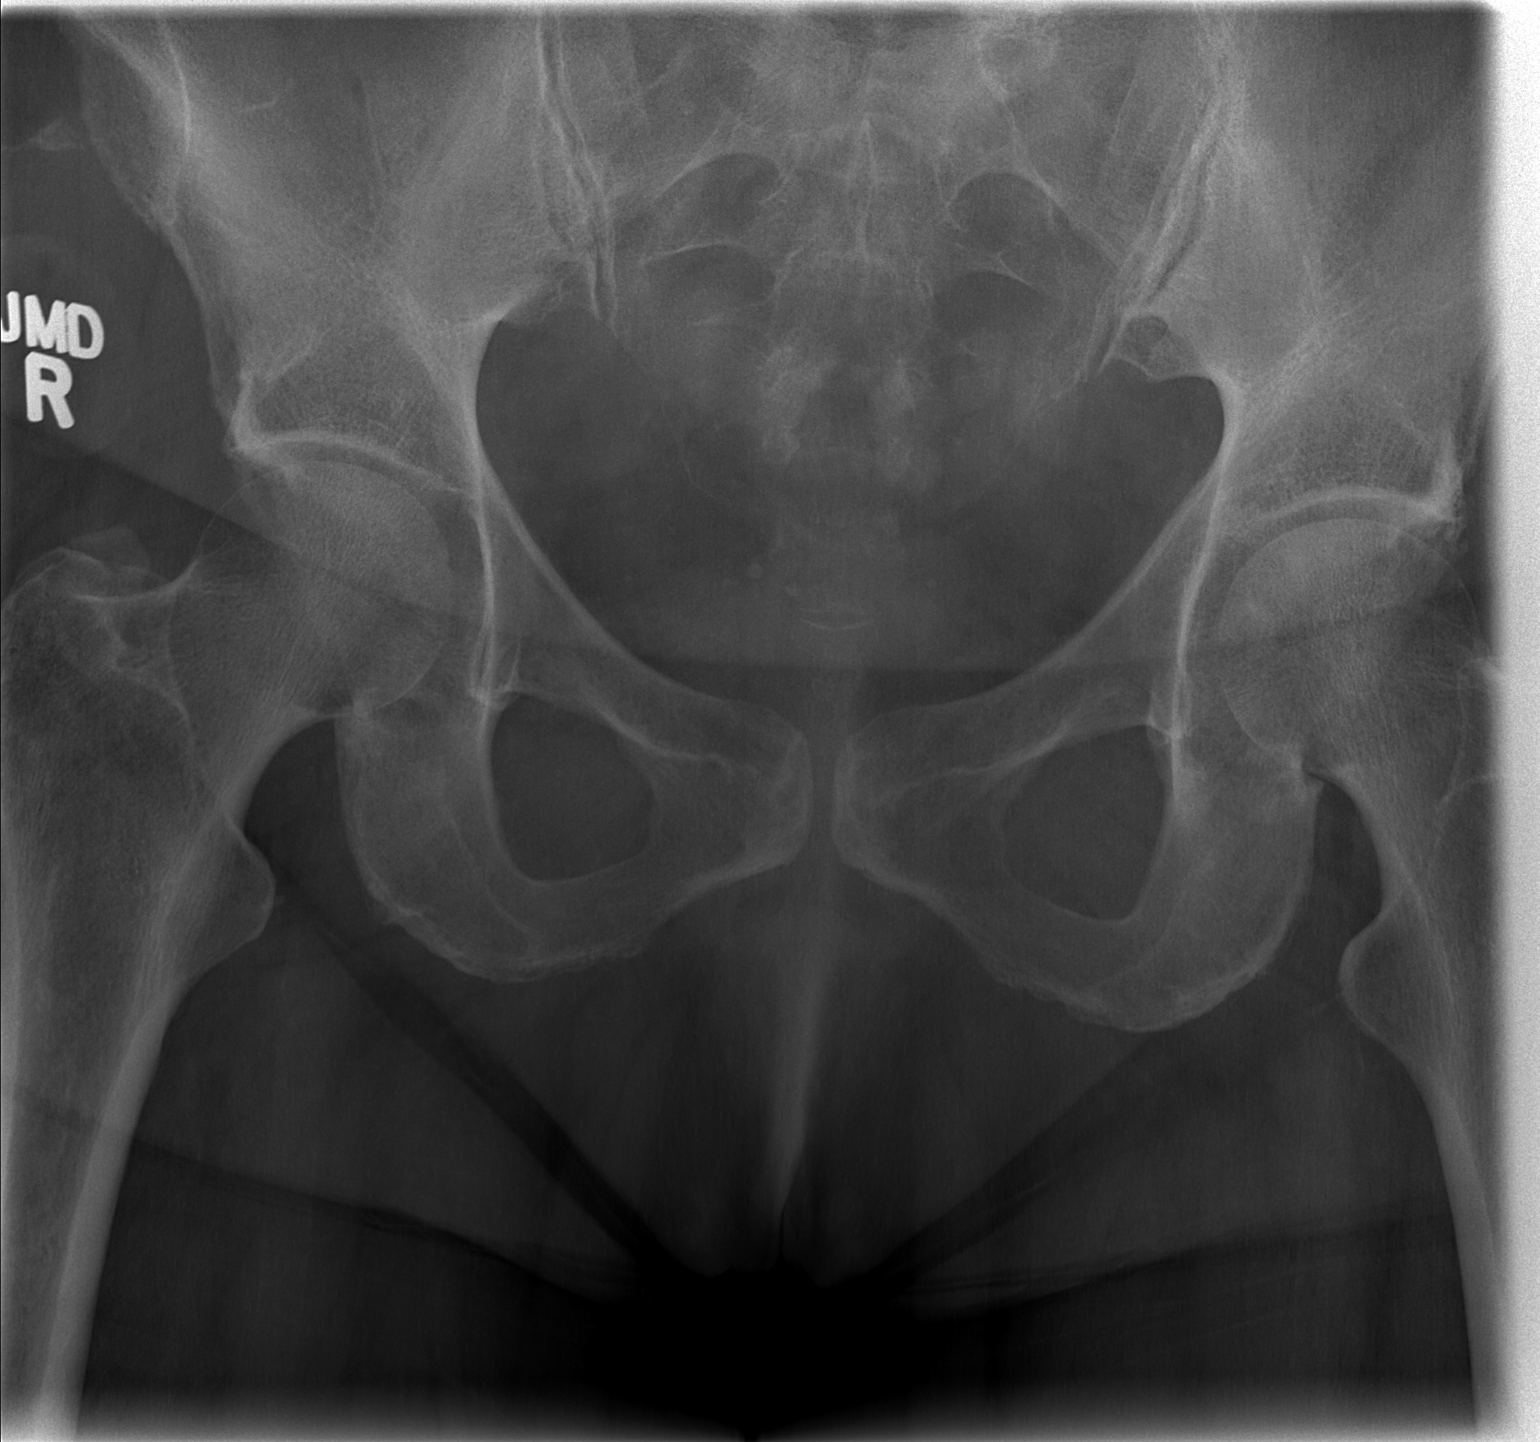

[2 of 2 positions shown; findings below may reference images not displayed]

FINDINGS: There are stones in the left kidney upper pole.  The
largest stone measures up to 1.5 cm.  There is an adjacent small
stone and a small peripheral stone.  Mild scoliosis and
degenerative changes in the lower lumbar spine.  Small phleboliths
in the pelvis.
IMPRESSION: Left kidney stones.  Largest measures up to 1.5 cm.

## 2013-05-10 ENCOUNTER — Other Ambulatory Visit: Payer: Self-pay | Admitting: Orthopedic Surgery

## 2013-06-02 ENCOUNTER — Other Ambulatory Visit (HOSPITAL_COMMUNITY): Payer: Medicare Other

## 2013-06-08 ENCOUNTER — Inpatient Hospital Stay (HOSPITAL_COMMUNITY): Admission: RE | Admit: 2013-06-08 | Payer: Medicare Other | Source: Ambulatory Visit | Admitting: Specialist

## 2013-06-08 ENCOUNTER — Encounter (HOSPITAL_COMMUNITY): Admission: RE | Payer: Self-pay | Source: Ambulatory Visit

## 2013-06-08 SURGERY — LUMBAR LAMINECTOMY/DECOMPRESSION MICRODISCECTOMY 3 LEVELS
Anesthesia: General | Site: Back

## 2013-07-18 ENCOUNTER — Other Ambulatory Visit: Payer: Self-pay | Admitting: Neurological Surgery

## 2013-07-29 ENCOUNTER — Encounter (HOSPITAL_COMMUNITY): Payer: Self-pay | Admitting: Pharmacy Technician

## 2013-08-05 ENCOUNTER — Encounter (HOSPITAL_COMMUNITY): Payer: Self-pay

## 2013-08-05 ENCOUNTER — Encounter (HOSPITAL_COMMUNITY)
Admission: RE | Admit: 2013-08-05 | Discharge: 2013-08-05 | Disposition: A | Discharge status: Home / Self Care | Payer: Medicare Other | Source: Ambulatory Visit | Attending: Neurological Surgery | Admitting: Neurological Surgery

## 2013-08-05 DIAGNOSIS — Z01818 Encounter for other preprocedural examination: Secondary | ICD-10-CM | POA: Insufficient documentation

## 2013-08-05 DIAGNOSIS — Z01812 Encounter for preprocedural laboratory examination: Secondary | ICD-10-CM | POA: Insufficient documentation

## 2013-08-05 HISTORY — DX: Headache: R51

## 2013-08-05 HISTORY — DX: Pneumonia, unspecified organism: J18.9

## 2013-08-05 LAB — CBC WITH DIFFERENTIAL/PLATELET
Basophils Absolute: 0.1 10*3/uL (ref 0.0–0.1)
Basophils Relative: 1 % (ref 0–1)
Eosinophils Relative: 3 % (ref 0–5)
HCT: 38.1 % (ref 36.0–46.0)
Lymphocytes Relative: 32 % (ref 12–46)
MCHC: 35.2 g/dL (ref 30.0–36.0)
Monocytes Absolute: 0.4 10*3/uL (ref 0.1–1.0)
Neutro Abs: 3.6 10*3/uL (ref 1.7–7.7)
Platelets: 223 10*3/uL (ref 150–400)
RDW: 12.9 % (ref 11.5–15.5)
WBC: 6.2 10*3/uL (ref 4.0–10.5)

## 2013-08-05 LAB — BASIC METABOLIC PANEL
BUN: 20 mg/dL (ref 6–23)
Calcium: 9.9 mg/dL (ref 8.4–10.5)
Chloride: 105 mEq/L (ref 96–112)
Creatinine, Ser: 0.83 mg/dL (ref 0.50–1.10)
GFR calc Af Amer: 80 mL/min — ABNORMAL LOW (ref >90)
GFR calc non Af Amer: 69 mL/min — ABNORMAL LOW (ref >90)

## 2013-08-05 LAB — PROTIME-INR: Prothrombin Time: 12.4 seconds (ref 11.6–15.2)

## 2013-08-05 LAB — SURGICAL PCR SCREEN
MRSA, PCR: POSITIVE — AB
Staphylococcus aureus: POSITIVE — AB

## 2013-08-05 LAB — TYPE AND SCREEN: ABO/RH(D): A POS

## 2013-08-05 IMAGING — CR DG CHEST 2V
2 series · 2 of 2 positions shown · non-contrast
Comparison: None.

CLINICAL DATA: Hypertension, preoperative evaluation for back
surgery

CHEST - 2 VIEW

[w chest pa]
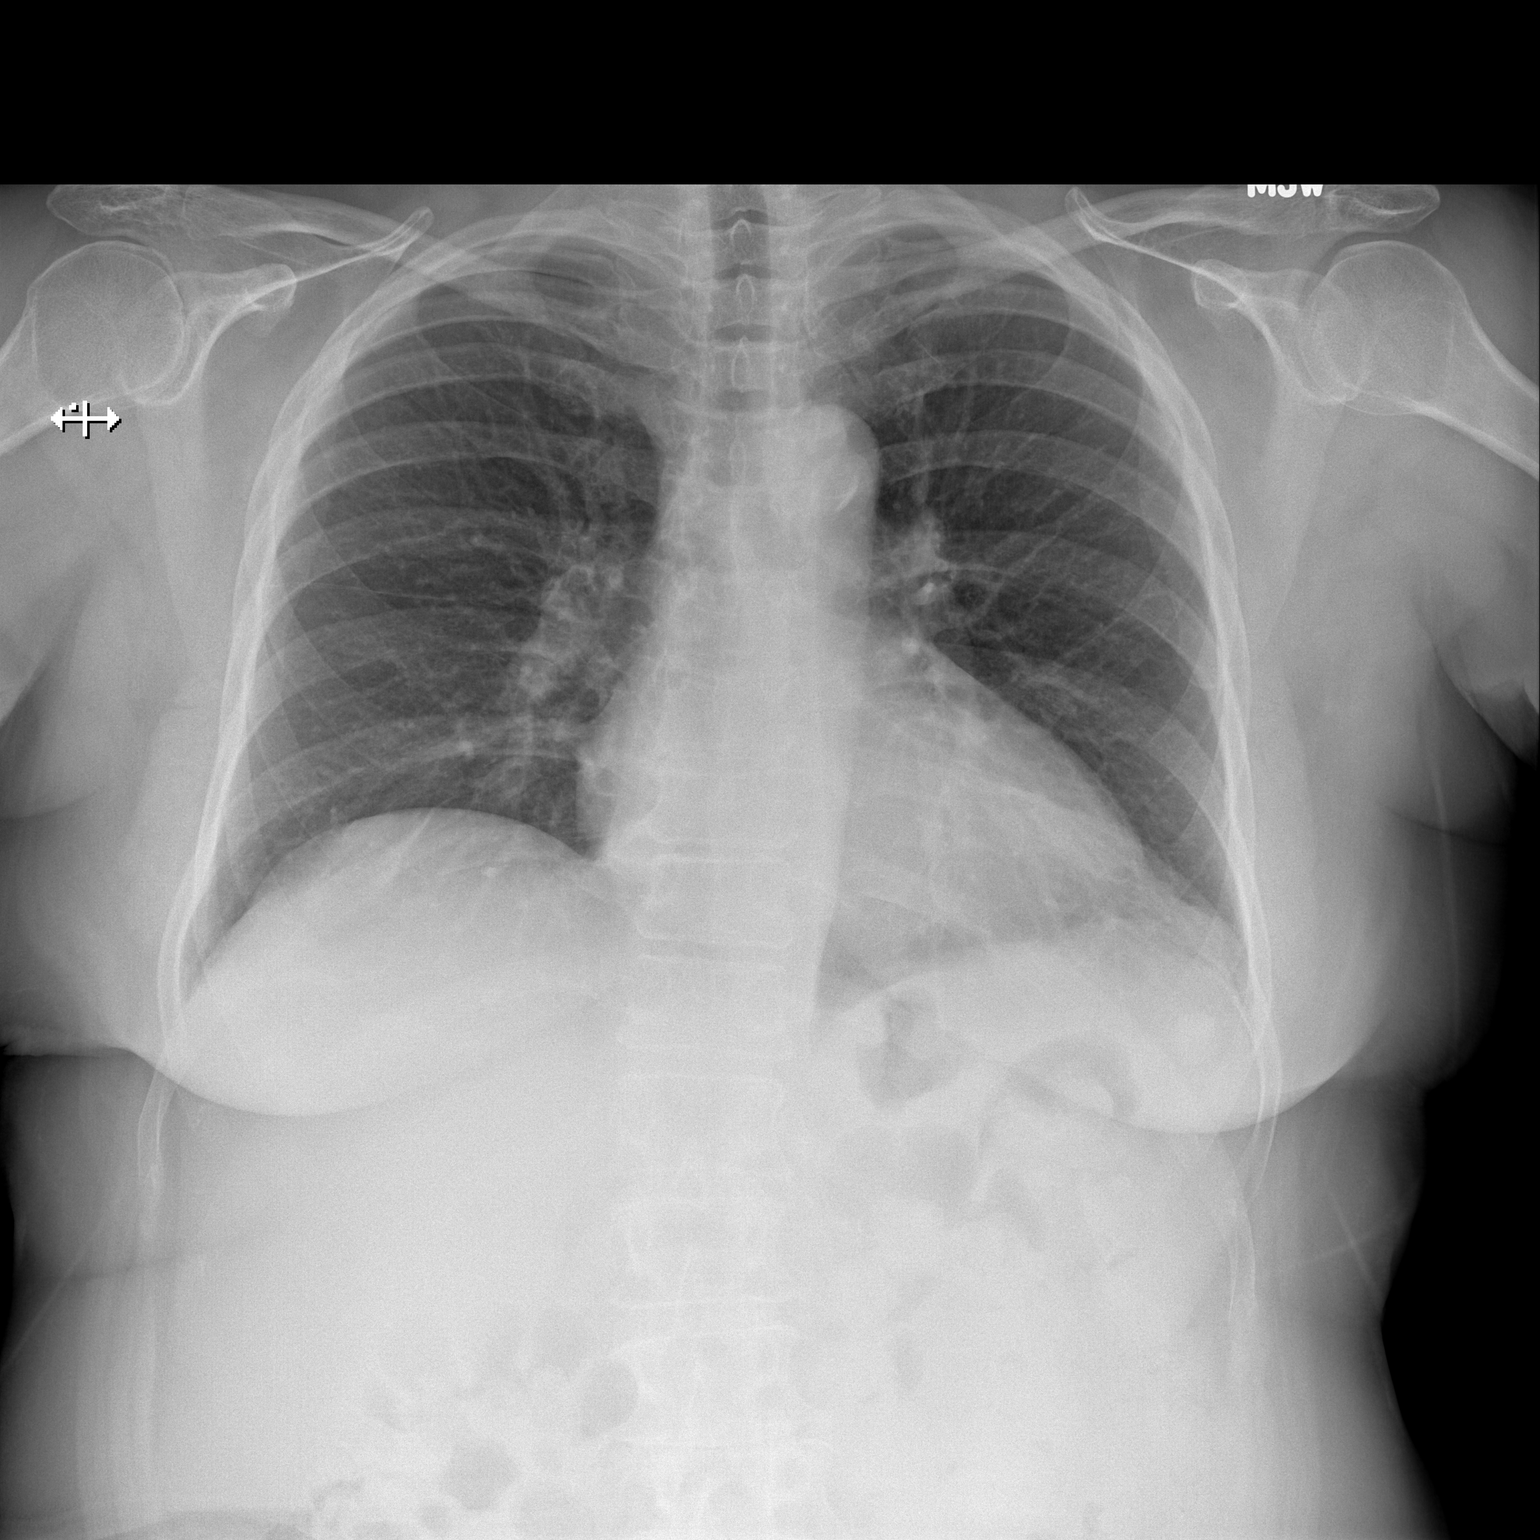

[w chest lat]
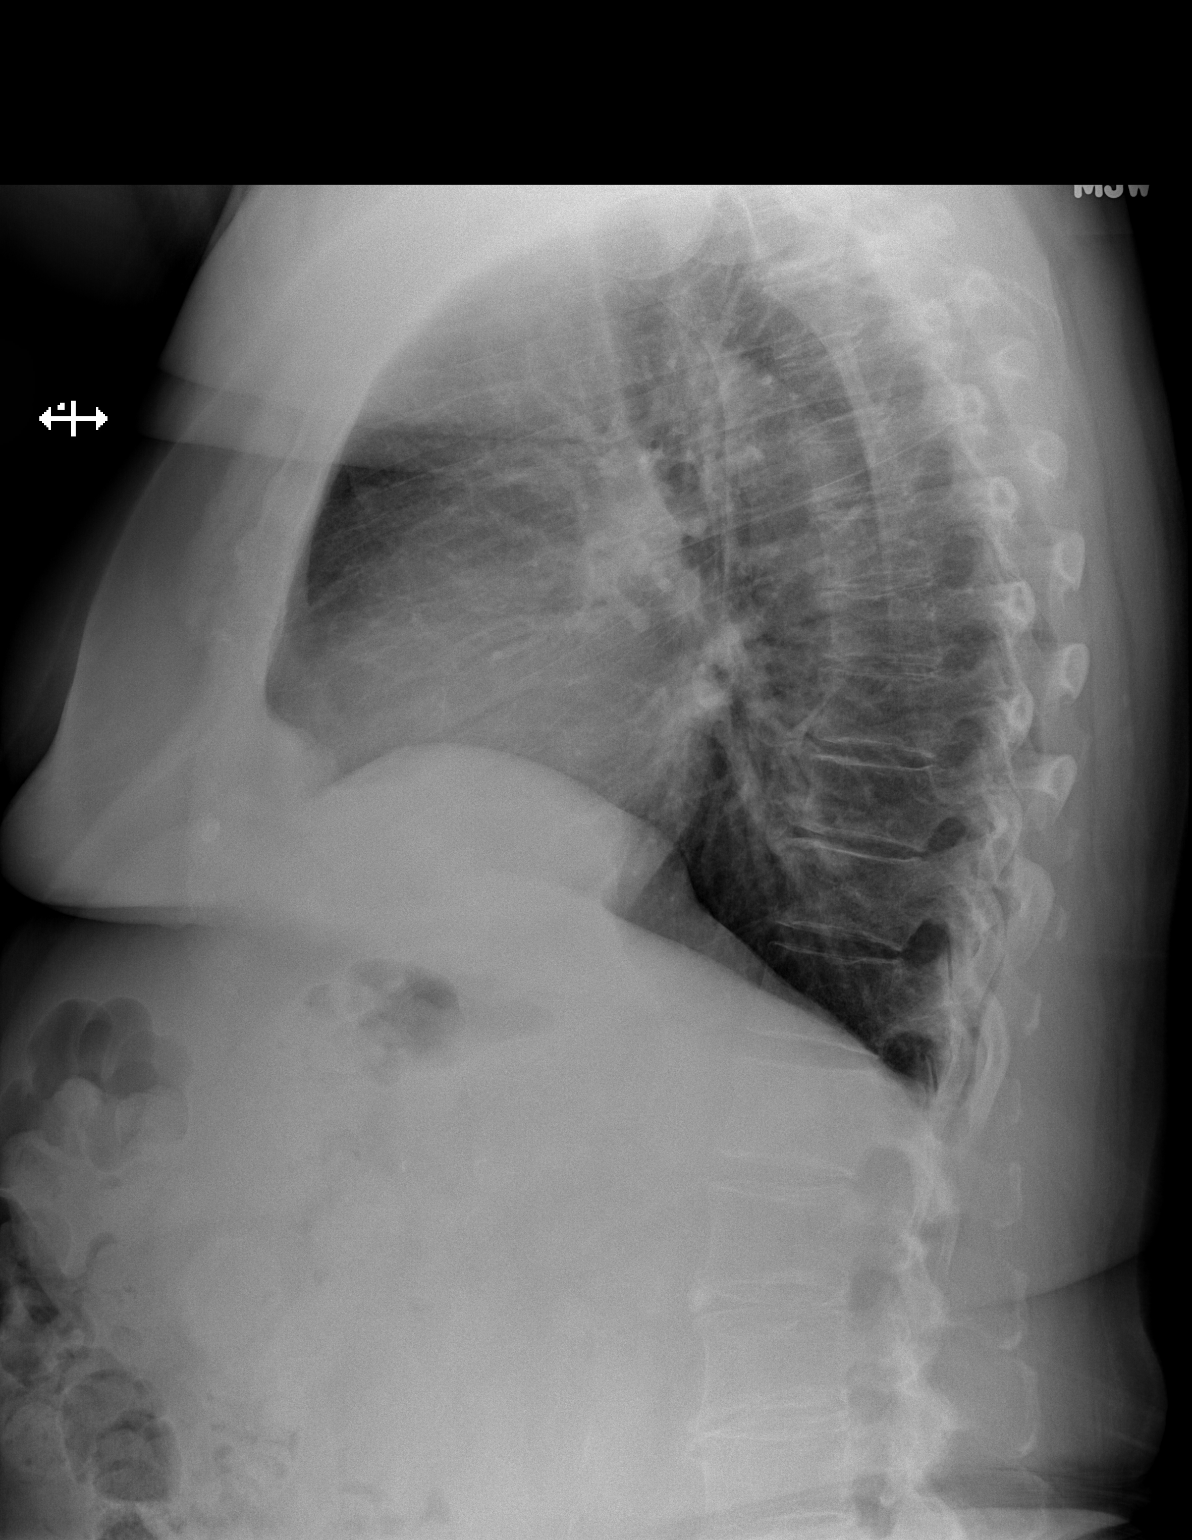

[2 of 2 positions shown; findings below may reference images not displayed]

FINDINGS: The heart and pulmonary vascularity are within normal
limits.  The lungs are clear bilaterally.  No acute bony
abnormality is seen.  A nipple shadow is noted overlying the left
lung base.
IMPRESSION: No acute abnormality noted.

## 2013-08-05 NOTE — Progress Notes (Signed)
TERRIBLE urgency post op even with foley catheter.

## 2013-08-05 NOTE — Anesthesia Preprocedure Evaluation (Addendum)
Anesthesia Evaluation  Patient identified by MRN, date of birth, ID band Patient awake    Reviewed: Allergy & Precautions, H&P , NPO status , Patient's Chart, lab work & pertinent test results  History of Anesthesia Complications (+) PONV  Airway Mallampati: II TM Distance: >3 FB Neck ROM: Full    Dental  (+) Dental Advisory Given and Teeth Intact   Pulmonary sleep apnea and Continuous Positive Airway Pressure Ventilation ,  breath sounds clear to auscultation        Cardiovascular hypertension, Rhythm:Regular Rate:Normal     Neuro/Psych  Headaches,    GI/Hepatic   Endo/Other    Renal/GU      Musculoskeletal   Abdominal   Peds  Hematology   Anesthesia Other Findings   Reproductive/Obstetrics                          Anesthesia Physical Anesthesia Plan  ASA: III  Anesthesia Plan: General   Post-op Pain Management:    Induction: Intravenous  Airway Management Planned: Oral ETT  Additional Equipment:   Intra-op Plan:   Post-operative Plan: Extubation in OR  Informed Consent: I have reviewed the patients History and Physical, chart, labs and discussed the procedure including the risks, benefits and alternatives for the proposed anesthesia with the patient or authorized representative who has indicated his/her understanding and acceptance.     Plan Discussed with: Anesthesiologist and Surgeon  Anesthesia Plan Comments: (See my anesthesia note.  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

## 2013-08-05 NOTE — Pre-Procedure Instructions (Signed)
Lisa [name]  08/05/2013   Your procedure is scheduled on:  08/12/2013  Report to [facility] at 5:30 AM.  Call this number if you have problems the morning of surgery: [phone]   Remember:   Do not eat food or drink liquids after midnight. On Thursday   Take these medicines the morning of surgery with A SIP OF WATER: Tylenol    Do not wear jewelry, make-up or nail polish.  Do not wear lotions, powders, or perfumes. You may wear deodorant.  Do not shave 48 hours prior to surgery.   Do not bring valuables to the hospital.  [facility] is not responsible                   for any belongings or valuables.  Contacts, dentures or bridgework may not be worn into surgery.  Leave suitcase in the car. After surgery it may be brought to your room.  For patients admitted to the hospital, checkout time is 11:00 AM the day of  Discharge      Patients discharged the day of surgery will not be allowed to drive  home.  Name and phone number of your driver: /w daughter   Special Instructions: Shower using CHG 2 nights before surgery and the night before surgery.  If you shower the day of surgery use CHG.  Use special wash - you have one bottle of CHG for all showers.  You should use approximately 1/3 of the bottle for each shower.   Please read over the following fact sheets that you were given: Pain Booklet, Coughing and Deep Breathing, Blood Transfusion Information, MRSA Information and Surgical Site Infection Prevention

## 2013-08-05 NOTE — Progress Notes (Signed)
Call to A. Zelenak, PAC, reviewed pt. Previous concerns for anesthesia, also reviewed that there is a order in the chart for for anesth. Consult.

## 2013-08-05 NOTE — Progress Notes (Signed)
Anesthesia PAT Evaluation:  Patient is a 72 year old female posted for a maximum access two level PLIF on 08/12/13 by Dr. Yetta Barre.  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  History includes obesity (BMI 34.5), non-smoker, OSA with CPAP use, HTN, HLD, osteoarthritis, headaches, degenerative disc disease, nephrolithiasis s/p lithotripsy 11/15/12 (MAC with Versed/Fentanyl), tonsillectomy, wisdom teeth extraction > 15 years ago, hysterectomy '90's, bladder suspension '90's (epidural anesthesia). PCP is listed as Dr. Theressa Millard.  EKG on 11/05/12 showed NSR with non-specific ST abnormality.  She denies known CAD/MI/CHF and any cardiac testing within the past three years.  CXR on 08/05/13 showed no acute abnormality.  Preoperative labs are WNL except for a low GFR and positive nasal PCR.  Anesthesia concerns:  1) She reports severe N/V even for days after anesthesia. 2) Problems with memory and processing for several months following her hysterectomy in the 90's. 3) Urinary urgency--at baseline she voids ~ Q 2 hours and surgery/foley catheter tends to exacerbate her symptoms.  Patient is a very pleasant Caucasian female.  She has had a difficult time with anesthesia in the past as listed above which has caused her more worry than the surgery itself.  I did discuss that she would be given an anti-emetic perioperatively and that I would alert her anesthesia team to be proactive in her anti-emetic therapy.  She also plans to ask Dr. Yetta Barre to avoid oxycodone which has caused her nausea in the past.  We discussed risks of post-operative cognitive disorder with age and length of surgery being her main risk factors.  Her anesthesia team will take her history into account in hopes to further minimize risk although she understands that it is not always predictable who will have a degree of post-operative cognitive dysfunction.  I encouraged her to talk further with Dr. Yetta Barre and her nursing staff  regarding post-operative urinary frequency and foley management. She will meet with her assigned anesthesiologist on the day of surgery to discuss the definitive anesthesia plan.  Velna Ochs [facility]/Anesthesiology Phone [phone] 08/05/2013 4:30 PM

## 2013-08-11 MED ORDER — CEFAZOLIN SODIUM-DEXTROSE 2-3 GM-% IV SOLR
2.0000 g | INTRAVENOUS | Status: DC
  Filled 2013-08-11: qty 50

## 2013-08-11 MED ORDER — DEXAMETHASONE SODIUM PHOSPHATE 10 MG/ML IJ SOLN
10.0000 mg | INTRAMUSCULAR | Status: AC
  Administered 2013-08-12: 10 mg via INTRAVENOUS
  Filled 2013-08-11: qty 1

## 2013-08-12 ENCOUNTER — Encounter (HOSPITAL_COMMUNITY): Payer: Self-pay | Admitting: *Deleted

## 2013-08-12 ENCOUNTER — Encounter (HOSPITAL_COMMUNITY)
Admission: RE | Disposition: A | Discharge status: Skilled Nursing Facility | Payer: Self-pay | Source: Ambulatory Visit | Attending: Neurological Surgery

## 2013-08-12 ENCOUNTER — Inpatient Hospital Stay (HOSPITAL_COMMUNITY): Payer: Medicare Other

## 2013-08-12 ENCOUNTER — Encounter (HOSPITAL_COMMUNITY): Payer: Self-pay | Admitting: Vascular Surgery

## 2013-08-12 ENCOUNTER — Inpatient Hospital Stay (HOSPITAL_COMMUNITY): Payer: Medicare Other | Admitting: Critical Care Medicine

## 2013-08-12 ENCOUNTER — Inpatient Hospital Stay (HOSPITAL_COMMUNITY)
Admission: RE | Admit: 2013-08-12 | Discharge: 2013-08-18 | DRG: 458 | Disposition: A | Discharge status: Skilled Nursing Facility | Payer: Medicare Other | Source: Ambulatory Visit | Attending: Neurological Surgery | Admitting: Neurological Surgery

## 2013-08-12 DIAGNOSIS — Z882 Allergy status to sulfonamides status: Secondary | ICD-10-CM

## 2013-08-12 DIAGNOSIS — M4180 Other forms of scoliosis, site unspecified: Principal | ICD-10-CM | POA: Diagnosis present

## 2013-08-12 DIAGNOSIS — M48062 Spinal stenosis, lumbar region with neurogenic claudication: Secondary | ICD-10-CM | POA: Diagnosis present

## 2013-08-12 DIAGNOSIS — M51379 Other intervertebral disc degeneration, lumbosacral region without mention of lumbar back pain or lower extremity pain: Secondary | ICD-10-CM | POA: Diagnosis present

## 2013-08-12 DIAGNOSIS — M5137 Other intervertebral disc degeneration, lumbosacral region: Secondary | ICD-10-CM | POA: Diagnosis present

## 2013-08-12 DIAGNOSIS — M48061 Spinal stenosis, lumbar region without neurogenic claudication: Secondary | ICD-10-CM

## 2013-08-12 DIAGNOSIS — Z79899 Other long term (current) drug therapy: Secondary | ICD-10-CM

## 2013-08-12 DIAGNOSIS — Z888 Allergy status to other drugs, medicaments and biological substances status: Secondary | ICD-10-CM

## 2013-08-12 HISTORY — PX: MAXIMUM ACCESS (MAS)POSTERIOR LUMBAR INTERBODY FUSION (PLIF) 2 LEVEL: SHX6369

## 2013-08-12 HISTORY — PX: POSTERIOR FUSION LUMBAR SPINE: SUR632

## 2013-08-12 IMAGING — RF DG C-ARM 61-120 MIN
1 series · 2 of 2 positions shown · non-contrast
Comparison: Outside MRI [DATE]

CLINICAL DATA: Spinal fusion.

DG C-ARM 61-120 MIN,LUMBAR SPINE - 2-3 VIEW
TECHNIQUE: C-arm fluoroscopic images were obtained
intraoperatively and submitted for postoperative interpretation.
Please see the performing provider's procedural report for the
fluoroscopy time utilized.

[Series 1: run · 2 of 2 slices shown]
[im 1/2]
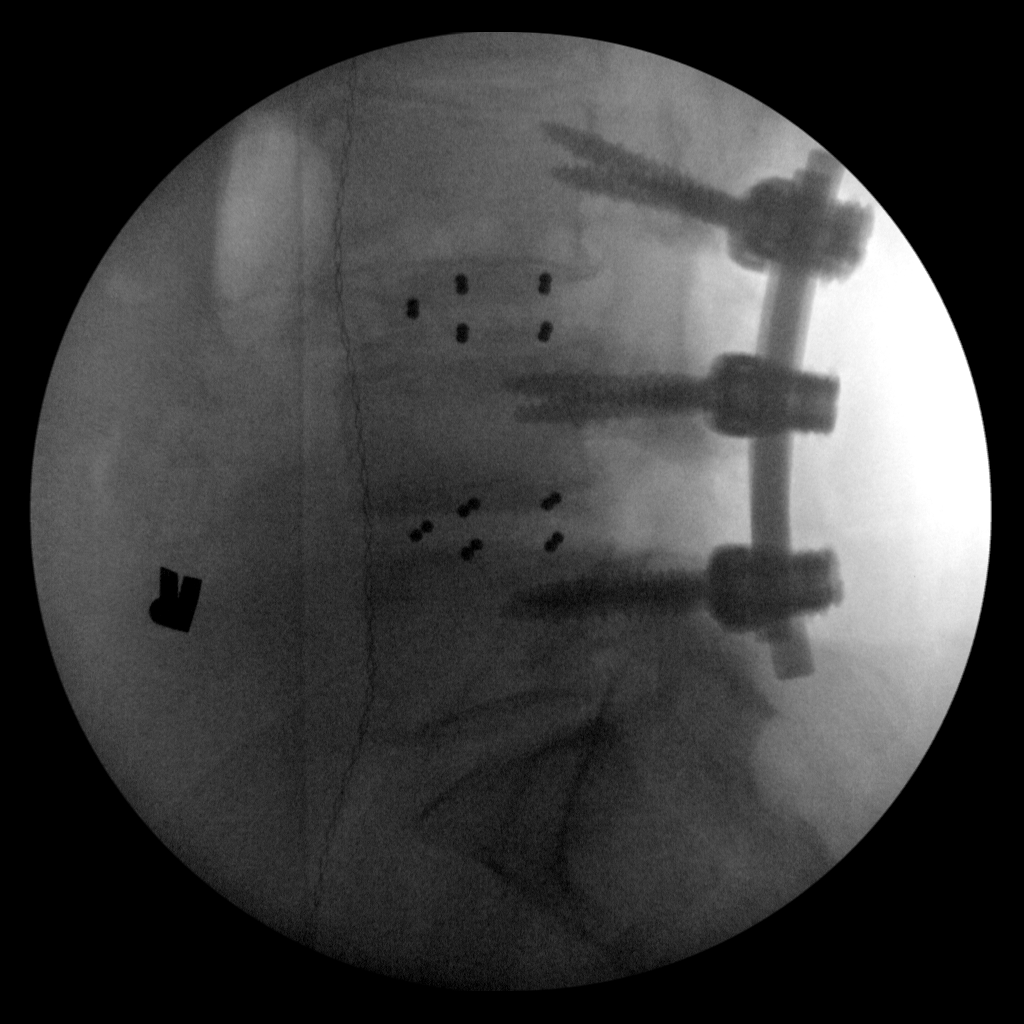
[im 2/2]
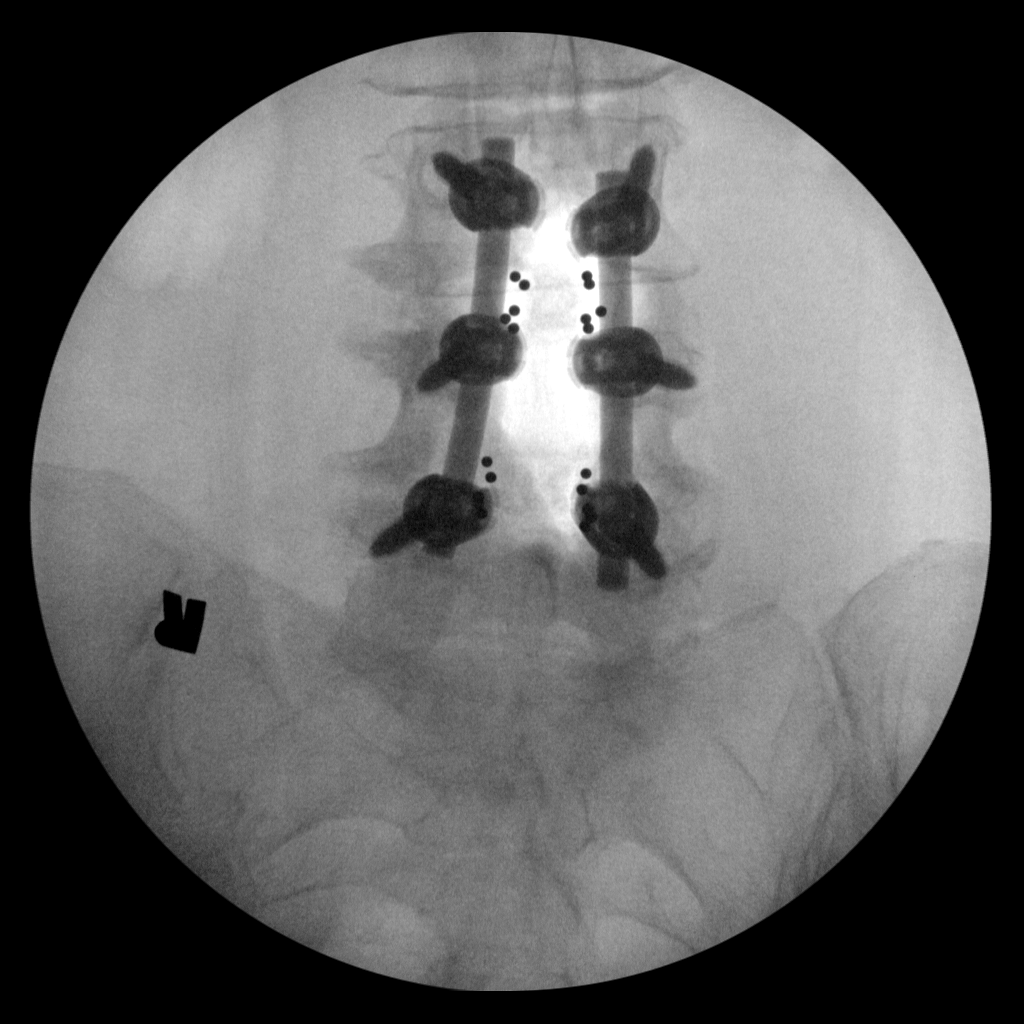

[2 of 2 positions shown; findings below may reference images not displayed]

FINDINGS: Two intraoperative images demonstrate bilateral pedicle
screw and rod fixation from L3-L5.  There are laminectomy defects
at these levels.  Interbody devices at L3-L4 and L4-L5.
IMPRESSION: Spinal fusion from L3-L5.

## 2013-08-12 IMAGING — CR DG CHEST 1V PORT
1 series · 1 of 1 positions shown · non-contrast
Comparison: [DATE]

CLINICAL DATA: Status post central line placement

PORTABLE CHEST - 1 VIEW

[AP]
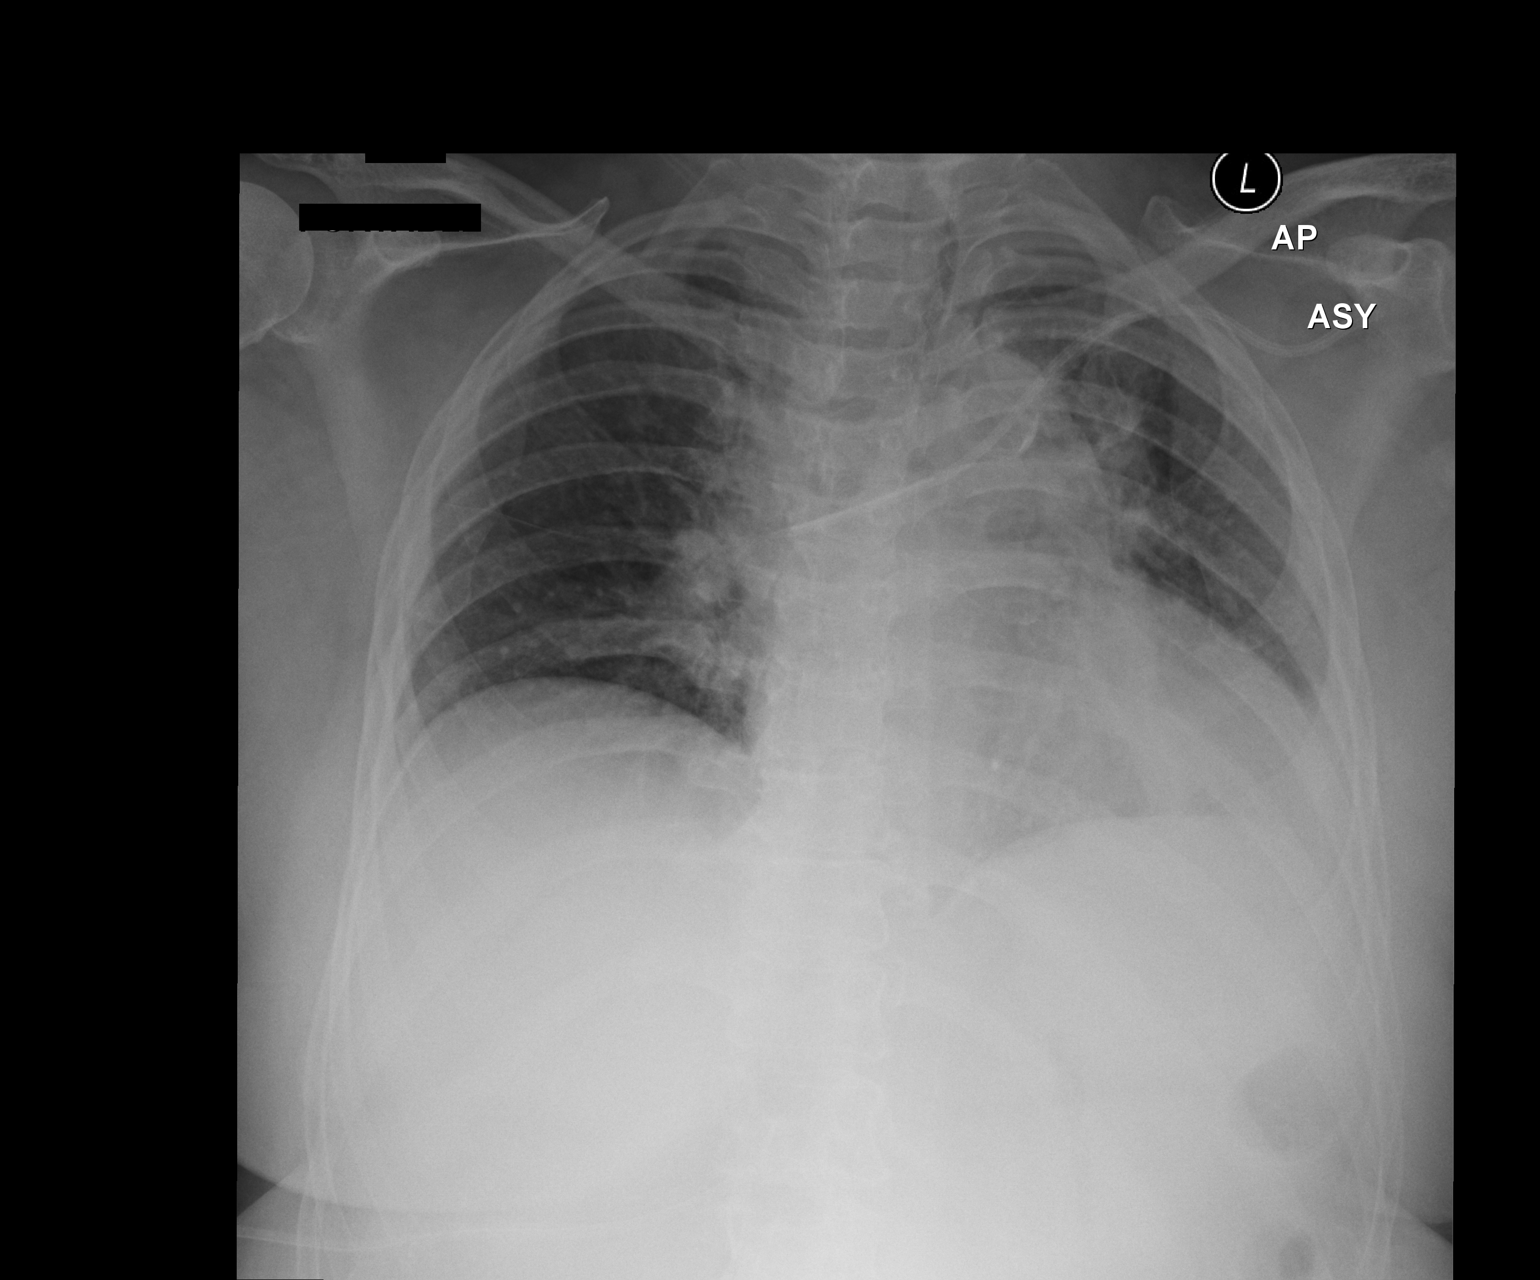

[1 of 1 positions shown; findings below may reference images not displayed]

FINDINGS: The overall inspiratory effort is poor although no focal
infiltrate is seen.  The cardiac shadow is within normal limits.  A
left-sided central venous line is seen with its tip projected over
the left innominate vein/SVC junction.  No pneumothorax is noted.
No bony abnormality is seen.
IMPRESSION: Status post central line placement without pneumothorax.  Catheter
tip is noted overlying the expected area of the proximal SVC.

These results were called by telephone on [DATE] at [6J] hours
to SHRUTHI, the patient's nurse, who verbally acknowledged these
results.

## 2013-08-12 SURGERY — FOR MAXIMUM ACCESS (MAS) POSTERIOR LUMBAR INTERBODY FUSION (PLIF) 2 LEVEL
Anesthesia: General | Site: Back

## 2013-08-12 MED ORDER — LIDOCAINE HCL (CARDIAC) 20 MG/ML IV SOLN
INTRAVENOUS | Status: DC | PRN
  Administered 2013-08-12: 20 mg via INTRAVENOUS

## 2013-08-12 MED ORDER — FENOFIBRATE 54 MG PO TABS
54.0000 mg | ORAL_TABLET | Freq: Every day | ORAL | Status: DC
  Administered 2013-08-12 – 2013-08-18 (×7): 54 mg via ORAL
  Filled 2013-08-12 (×7): qty 1

## 2013-08-12 MED ORDER — SODIUM CHLORIDE 0.9 % IR SOLN
Status: DC | PRN
  Administered 2013-08-12: 08:00:00

## 2013-08-12 MED ORDER — THROMBIN 20000 UNITS EX SOLR
CUTANEOUS | Status: DC | PRN
  Administered 2013-08-12: 08:00:00 via TOPICAL

## 2013-08-12 MED ORDER — HYDROMORPHONE HCL PF 1 MG/ML IJ SOLN
INTRAMUSCULAR | Status: AC
  Administered 2013-08-12: 0.5 mg via INTRAVENOUS
  Filled 2013-08-12: qty 1

## 2013-08-12 MED ORDER — DEXMEDETOMIDINE HCL IN NACL 200 MCG/50ML IV SOLN
INTRAVENOUS | Status: DC | PRN
  Administered 2013-08-12: 0.4 ug/kg/h via INTRAVENOUS

## 2013-08-12 MED ORDER — METOCLOPRAMIDE HCL 5 MG/ML IJ SOLN
INTRAMUSCULAR | Status: DC | PRN
  Administered 2013-08-12: 10 mg via INTRAVENOUS

## 2013-08-12 MED ORDER — SIMVASTATIN 40 MG PO TABS
40.0000 mg | ORAL_TABLET | Freq: Every day | ORAL | Status: DC
  Administered 2013-08-12 – 2013-08-17 (×6): 40 mg via ORAL
  Filled 2013-08-12 (×7): qty 1

## 2013-08-12 MED ORDER — ARTIFICIAL TEARS OP OINT
TOPICAL_OINTMENT | OPHTHALMIC | Status: DC | PRN
  Administered 2013-08-12: 1 via OPHTHALMIC

## 2013-08-12 MED ORDER — EPHEDRINE SULFATE 50 MG/ML IJ SOLN
INTRAMUSCULAR | Status: DC | PRN
  Administered 2013-08-12: 5 mg via INTRAVENOUS
  Administered 2013-08-12: 10 mg via INTRAVENOUS

## 2013-08-12 MED ORDER — ACETAMINOPHEN 325 MG PO TABS: 650.0000 mg | ORAL_TABLET | ORAL | Status: DC | PRN

## 2013-08-12 MED ORDER — ACETAMINOPHEN 650 MG RE SUPP
650.0000 mg | RECTAL | Status: DC | PRN
Start: 2013-08-12 — End: 2013-08-18

## 2013-08-12 MED ORDER — PHENOL 1.4 % MT LIQD: 1.0000 | OROMUCOSAL | Status: DC | PRN

## 2013-08-12 MED ORDER — MORPHINE SULFATE 2 MG/ML IJ SOLN
1.0000 mg | INTRAMUSCULAR | Status: DC | PRN
  Administered 2013-08-12 – 2013-08-13 (×4): 2 mg via INTRAVENOUS
  Filled 2013-08-12 (×4): qty 1

## 2013-08-12 MED ORDER — BUPIVACAINE HCL (PF) 0.25 % IJ SOLN
INTRAMUSCULAR | Status: DC | PRN
  Administered 2013-08-12: 5 mL

## 2013-08-12 MED ORDER — VANCOMYCIN HCL IN DEXTROSE 1-5 GM/200ML-% IV SOLN
1000.0000 mg | Freq: Two times a day (BID) | INTRAVENOUS | Status: AC
  Administered 2013-08-12: 1000 mg via INTRAVENOUS
  Filled 2013-08-12: qty 200

## 2013-08-12 MED ORDER — DEXAMETHASONE SODIUM PHOSPHATE 4 MG/ML IJ SOLN
4.0000 mg | Freq: Four times a day (QID) | INTRAMUSCULAR | Status: DC
  Filled 2013-08-12 (×25): qty 1

## 2013-08-12 MED ORDER — MENTHOL 3 MG MT LOZG: 1.0000 | LOZENGE | OROMUCOSAL | Status: DC | PRN

## 2013-08-12 MED ORDER — POTASSIUM CHLORIDE IN NACL 20-0.9 MEQ/L-% IV SOLN
INTRAVENOUS | Status: DC
  Administered 2013-08-12 – 2013-08-13 (×2): via INTRAVENOUS
  Filled 2013-08-12 (×13): qty 1000

## 2013-08-12 MED ORDER — LORATADINE 10 MG PO TABS
10.0000 mg | ORAL_TABLET | Freq: Every day | ORAL | Status: DC
  Administered 2013-08-13 – 2013-08-18 (×6): 10 mg via ORAL
  Filled 2013-08-12 (×6): qty 1

## 2013-08-12 MED ORDER — HYDROCODONE-ACETAMINOPHEN 5-325 MG PO TABS
1.0000 | ORAL_TABLET | ORAL | Status: DC | PRN
  Administered 2013-08-12 – 2013-08-18 (×23): 2 via ORAL
  Filled 2013-08-12 (×23): qty 2

## 2013-08-12 MED ORDER — MIDAZOLAM HCL 2 MG/2ML IJ SOLN
INTRAMUSCULAR | Status: AC
  Filled 2013-08-12: qty 2

## 2013-08-12 MED ORDER — ONDANSETRON HCL 4 MG/2ML IJ SOLN: 4.0000 mg | INTRAMUSCULAR | Status: DC | PRN

## 2013-08-12 MED ORDER — ONDANSETRON HCL 4 MG/2ML IJ SOLN
INTRAMUSCULAR | Status: DC | PRN
  Administered 2013-08-12 (×2): 4 mg via INTRAVENOUS

## 2013-08-12 MED ORDER — METHOCARBAMOL 100 MG/ML IJ SOLN
500.0000 mg | Freq: Four times a day (QID) | INTRAMUSCULAR | Status: DC | PRN
  Filled 2013-08-12: qty 5

## 2013-08-12 MED ORDER — 0.9 % SODIUM CHLORIDE (POUR BTL) OPTIME
TOPICAL | Status: DC | PRN
  Administered 2013-08-12: 1000 mL

## 2013-08-12 MED ORDER — SODIUM CHLORIDE 0.9 % IV SOLN: 250.0000 mL | INTRAVENOUS | Status: DC

## 2013-08-12 MED ORDER — FENTANYL CITRATE 0.05 MG/ML IJ SOLN
INTRAMUSCULAR | Status: DC | PRN
  Administered 2013-08-12 (×3): 50 ug via INTRAVENOUS
  Administered 2013-08-12: 100 ug via INTRAVENOUS

## 2013-08-12 MED ORDER — DEXMEDETOMIDINE HCL IN NACL 400 MCG/100ML IV SOLN
0.4000 ug/kg/h | INTRAVENOUS | Status: DC
  Filled 2013-08-12: qty 100

## 2013-08-12 MED ORDER — LACTATED RINGERS IV SOLN
INTRAVENOUS | Status: DC | PRN
  Administered 2013-08-12: 08:00:00 via INTRAVENOUS

## 2013-08-12 MED ORDER — CEFAZOLIN SODIUM 1-5 GM-% IV SOLN: 1.0000 g | Freq: Three times a day (TID) | INTRAVENOUS | Status: DC

## 2013-08-12 MED ORDER — MIDAZOLAM HCL 2 MG/2ML IJ SOLN
1.0000 mg | Freq: Once | INTRAMUSCULAR | Status: AC
  Administered 2013-08-12: 1 mg via INTRAVENOUS

## 2013-08-12 MED ORDER — ONDANSETRON HCL 4 MG/2ML IJ SOLN: 4.0000 mg | Freq: Once | INTRAMUSCULAR | Status: DC | PRN

## 2013-08-12 MED ORDER — SODIUM CHLORIDE 0.9 % IJ SOLN: 3.0000 mL | INTRAMUSCULAR | Status: DC | PRN

## 2013-08-12 MED ORDER — VANCOMYCIN HCL IN DEXTROSE 1-5 GM/200ML-% IV SOLN
INTRAVENOUS | Status: AC
  Administered 2013-08-12: 1000 mg via INTRAVENOUS
  Filled 2013-08-12: qty 200

## 2013-08-12 MED ORDER — SUCCINYLCHOLINE CHLORIDE 20 MG/ML IJ SOLN
INTRAMUSCULAR | Status: DC | PRN
  Administered 2013-08-12: 120 mg via INTRAVENOUS

## 2013-08-12 MED ORDER — SCOPOLAMINE 1 MG/3DAYS TD PT72
MEDICATED_PATCH | TRANSDERMAL | Status: AC
  Filled 2013-08-12: qty 1

## 2013-08-12 MED ORDER — PROPOFOL 10 MG/ML IV BOLUS
INTRAVENOUS | Status: DC | PRN
  Administered 2013-08-12: 50 mg via INTRAVENOUS
  Administered 2013-08-12: 150 mg via INTRAVENOUS

## 2013-08-12 MED ORDER — ACETAMINOPHEN 10 MG/ML IV SOLN
INTRAVENOUS | Status: AC
  Administered 2013-08-12: 1000 mg via INTRAVENOUS
  Filled 2013-08-12: qty 100

## 2013-08-12 MED ORDER — DEXMEDETOMIDINE HCL IN NACL 200 MCG/50ML IV SOLN
0.4000 ug/kg/h | INTRAVENOUS | Status: DC
  Filled 2013-08-12: qty 50

## 2013-08-12 MED ORDER — FENOFIBRATE 54 MG PO TABS: 54.0000 mg | ORAL_TABLET | Freq: Every day | ORAL | Status: DC

## 2013-08-12 MED ORDER — MIDAZOLAM HCL 5 MG/5ML IJ SOLN
INTRAMUSCULAR | Status: DC | PRN
  Administered 2013-08-12: 2 mg via INTRAVENOUS

## 2013-08-12 MED ORDER — THROMBIN 5000 UNITS EX SOLR
OROMUCOSAL | Status: DC | PRN
  Administered 2013-08-12: 09:00:00 via TOPICAL

## 2013-08-12 MED ORDER — SODIUM CHLORIDE 0.9 % IJ SOLN
3.0000 mL | Freq: Two times a day (BID) | INTRAMUSCULAR | Status: DC
  Administered 2013-08-13 – 2013-08-17 (×2): 3 mL via INTRAVENOUS

## 2013-08-12 MED ORDER — DEXAMETHASONE 4 MG PO TABS
4.0000 mg | ORAL_TABLET | Freq: Four times a day (QID) | ORAL | Status: DC
  Administered 2013-08-12 – 2013-08-18 (×24): 4 mg via ORAL
  Filled 2013-08-12 (×27): qty 1

## 2013-08-12 MED ORDER — METHOCARBAMOL 500 MG PO TABS
500.0000 mg | ORAL_TABLET | Freq: Four times a day (QID) | ORAL | Status: DC | PRN
  Administered 2013-08-12 – 2013-08-16 (×3): 500 mg via ORAL
  Filled 2013-08-12 (×3): qty 1

## 2013-08-12 MED ORDER — HYDROMORPHONE HCL PF 1 MG/ML IJ SOLN
0.2500 mg | INTRAMUSCULAR | Status: DC | PRN
  Administered 2013-08-12 (×2): 0.5 mg via INTRAVENOUS

## 2013-08-12 MED ORDER — LACTATED RINGERS IV SOLN
INTRAVENOUS | Status: DC | PRN
  Administered 2013-08-12 (×2): via INTRAVENOUS

## 2013-08-12 MED ORDER — DEXMEDETOMIDINE HCL IN NACL 200 MCG/50ML IV SOLN: 0.4000 ug/kg/h | INTRAVENOUS | Status: DC

## 2013-08-12 SURGICAL SUPPLY — 72 items
APL SKNCLS STERI-STRIP NONHPOA (GAUZE/BANDAGES/DRESSINGS) ×1
BAG DECANTER FOR FLEXI CONT (MISCELLANEOUS) ×2 IMPLANT
BENZOIN TINCTURE PRP APPL 2/3 (GAUZE/BANDAGES/DRESSINGS) ×2 IMPLANT
BLADE SURG ROTATE 9660 (MISCELLANEOUS) IMPLANT
BONE MATRIX OSTEOCEL PRO LRG (Bone Implant) ×1 IMPLANT
BUR MATCHSTICK NEURO 3.0 LAGG (BURR) ×2 IMPLANT
CAGE COROENT 9X9X23-4 (Cage) ×2 IMPLANT
CAGE COROENT MP 8X23 (Cage) ×2 IMPLANT
CANISTER SUCTION 2500CC (MISCELLANEOUS) ×2 IMPLANT
CLIP NEUROVISION LG (CLIP) ×1 IMPLANT
CLOTH BEACON ORANGE TIMEOUT ST (SAFETY) ×2 IMPLANT
CONT SPEC 4OZ CLIKSEAL STRL BL (MISCELLANEOUS) ×4 IMPLANT
COVER BACK TABLE 24X17X13 BIG (DRAPES) IMPLANT
COVER TABLE BACK 60X90 (DRAPES) ×2 IMPLANT
DRAPE C-ARM 42X72 X-RAY (DRAPES) ×2 IMPLANT
DRAPE C-ARMOR (DRAPES) ×2 IMPLANT
DRAPE LAPAROTOMY 100X72X124 (DRAPES) ×2 IMPLANT
DRAPE POUCH INSTRU U-SHP 10X18 (DRAPES) ×2 IMPLANT
DRAPE SURG 17X23 STRL (DRAPES) ×2 IMPLANT
DRESSING TELFA 8X3 (GAUZE/BANDAGES/DRESSINGS) ×2 IMPLANT
DRSG OPSITE 4X5.5 SM (GAUZE/BANDAGES/DRESSINGS) ×4 IMPLANT
DRSG OPSITE POSTOP 4X6 (GAUZE/BANDAGES/DRESSINGS) ×1 IMPLANT
DURAPREP 26ML APPLICATOR (WOUND CARE) ×2 IMPLANT
ELECT REM PT RETURN 9FT ADLT (ELECTROSURGICAL) ×2
ELECTRODE REM PT RTRN 9FT ADLT (ELECTROSURGICAL) ×1 IMPLANT
EVACUATOR 1/8 PVC DRAIN (DRAIN) ×2 IMPLANT
GAUZE SPONGE 4X4 16PLY XRAY LF (GAUZE/BANDAGES/DRESSINGS) IMPLANT
GLOVE BIO SURGEON STRL SZ8 (GLOVE) ×4 IMPLANT
GLOVE BIOGEL PI IND STRL 7.0 (GLOVE) IMPLANT
GLOVE BIOGEL PI IND STRL 7.5 (GLOVE) IMPLANT
GLOVE BIOGEL PI INDICATOR 7.0 (GLOVE) ×3
GLOVE BIOGEL PI INDICATOR 7.5 (GLOVE) ×1
GLOVE ECLIPSE 6.5 STRL STRAW (GLOVE) ×1 IMPLANT
GLOVE ECLIPSE 7.5 STRL STRAW (GLOVE) ×1 IMPLANT
GLOVE OPTIFIT SS 6.5 STRL BRWN (GLOVE) ×3 IMPLANT
GOWN BRE IMP SLV AUR LG STRL (GOWN DISPOSABLE) ×3 IMPLANT
GOWN BRE IMP SLV AUR XL STRL (GOWN DISPOSABLE) ×4 IMPLANT
GOWN STRL REIN 2XL LVL4 (GOWN DISPOSABLE) IMPLANT
HEMOSTAT POWDER KIT SURGIFOAM (HEMOSTASIS) IMPLANT
KIT BASIN OR (CUSTOM PROCEDURE TRAY) ×2 IMPLANT
KIT NDL NVM5 EMG ELECT (KITS) IMPLANT
KIT NEEDLE NVM5 EMG ELECT (KITS) ×1 IMPLANT
KIT NEEDLE NVM5 EMG ELECTRODE (KITS) ×1
KIT ROOM TURNOVER OR (KITS) ×2 IMPLANT
MILL MEDIUM DISP (BLADE) ×1 IMPLANT
NDL HYPO 25X1 1.5 SAFETY (NEEDLE) ×1 IMPLANT
NEEDLE HYPO 25X1 1.5 SAFETY (NEEDLE) ×2 IMPLANT
NS IRRIG 1000ML POUR BTL (IV SOLUTION) ×2 IMPLANT
PACK LAMINECTOMY NEURO (CUSTOM PROCEDURE TRAY) ×2 IMPLANT
PAD ARMBOARD 7.5X6 YLW CONV (MISCELLANEOUS) ×6 IMPLANT
ROD 70MM (Rod) ×4 IMPLANT
ROD SPNL 70XPREBNT NS MAS (Rod) IMPLANT
SCREW 5.0X30 (Screw) ×2 IMPLANT
SCREW LOCK (Screw) ×12 IMPLANT
SCREW LOCK FXNS SPNE MAS PL (Screw) IMPLANT
SCREW MAS PLIF 5.5X30 (Screw) ×2 IMPLANT
SCREW PAS PLIF 5X30 (Screw) IMPLANT
SCREW SHANK 5.0X30MM (Screw) ×2 IMPLANT
SCREW TULIP 5.5 (Screw) ×2 IMPLANT
SPONGE LAP 4X18 X RAY DECT (DISPOSABLE) IMPLANT
SPONGE SURGIFOAM ABS GEL 100 (HEMOSTASIS) ×2 IMPLANT
STRIP CLOSURE SKIN 1/2X4 (GAUZE/BANDAGES/DRESSINGS) ×4 IMPLANT
SUT VIC AB 0 CT1 18XCR BRD8 (SUTURE) ×1 IMPLANT
SUT VIC AB 0 CT1 8-18 (SUTURE) ×2
SUT VIC AB 2-0 CP2 18 (SUTURE) ×2 IMPLANT
SUT VIC AB 3-0 SH 8-18 (SUTURE) ×4 IMPLANT
SYR 20ML ECCENTRIC (SYRINGE) ×2 IMPLANT
TAPE STRIPS DRAPE STRL (GAUZE/BANDAGES/DRESSINGS) ×1 IMPLANT
TOWEL OR 17X24 6PK STRL BLUE (TOWEL DISPOSABLE) ×2 IMPLANT
TOWEL OR 17X26 10 PK STRL BLUE (TOWEL DISPOSABLE) ×2 IMPLANT
TRAY FOLEY CATH 14FRSI W/METER (CATHETERS) ×2 IMPLANT
WATER STERILE IRR 1000ML POUR (IV SOLUTION) ×2 IMPLANT

## 2013-08-12 NOTE — Anesthesia Procedure Notes (Signed)
Procedure Name: Intubation Date/Time: 08/12/2013 7:39 AM Performed by: Elon Alas Pre-anesthesia Checklist: Patient identified, Timeout performed, Emergency Drugs available, Suction available and Patient being monitored Patient Re-evaluated:Patient Re-evaluated prior to inductionOxygen Delivery Method: Circle system utilized Preoxygenation: Pre-oxygenation with 100% oxygen Intubation Type: IV induction Ventilation: Mask ventilation with difficulty Laryngoscope Size: Mac and 3 Grade View: Grade II Tube type: Oral Tube size: 7.0 mm Number of attempts: 1 Airway Equipment and Method: Stylet Placement Confirmation: positive ETCO2,  ETT inserted through vocal cords under direct vision and breath sounds checked- equal and bilateral Secured at: 21 cm Tube secured with: Tape Dental Injury: Teeth and Oropharynx as per pre-operative assessment

## 2013-08-12 NOTE — Op Note (Signed)
08/12/2013  11:27 AM  PATIENT:  Lisa [name]  72 y.o. 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 Decompressive lumbar laminectomy L3-4 L4-5 requiring more work than would be required for a simple exposure of the disk for PLIF in order to adequately decompress the neural elements and address the spinal stenosis 2. Posterior lumbar interbody fusion L3-4 L4-5 using PEEK interbody cages packed with morcellized allograft and autograft 3. Posterior fixation L3-4 L4-5 using cortical pedicle screws.    SURGEON:  Marikay Alar, MD  ASSISTANTS: Dr. Franky Macho  ANESTHESIA:  General  EBL: 300 ml  Total I/O In: 1900 [I.V.:1900] Out: 535 [Urine:235; Blood:300]  BLOOD ADMINISTERED:none  DRAINS: Hemovac   INDICATION FOR PROCEDURE: This patient presented with a long history of back and bilateral leg pain. 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 She tried medical management, physical therapy, and injection therapy without relief. Recommended a decompression and instrumented fusion. Patient understood the risks, benefits, and alternatives and potential outcomes and wished to proceed.  PROCEDURE DETAILS:  The patient was brought to the operating room. After induction of generalized endotracheal anesthesia the patient was rolled into the prone position on chest rolls and all pressure points were padded. The patient's lumbar region was cleaned and then prepped with DuraPrep and draped in the usual sterile fashion. Anesthesia was injected and then a dorsal midline incision was made and carried down to the lumbosacral fascia. The fascia was opened and the paraspinous musculature was taken down in a subperiosteal fashion to expose L3-4 and  L4-5. A self-retaining retractor was placed. Intraoperative fluoroscopy confirmed my level, and I started with placement of the L3 cortical pedicle screws. The pedicle screw entry zones were identified utilizing surface landmarks and  AP and lateral fluoroscopy. I scored the cortex with the high-speed drill and then used the hand drill and EMG monitoring to drill an upward and outward direction into the pedicle. I then tapped line to line, and the tap was also monitored. I then placed a 5-0 by 30 mm cortical pedicle screw into the pedicles of L3 bilaterally. I then turned my attention to the decompression and the spinous process was removed and complete lumbar laminectomies, hemi- facetectomies, and foraminotomies were performed at L34 L4-5. The patient had significant spinal stenosis and this required more work than would be required for a simple exposure of the disc for posterior lumbar interbody fusion. Much more generous decompression was undertaken in order to adequately decompress the neural elements and address the patient's leg pain. The yellow ligament was removed to expose the underlying dura and nerve roots, and generous foraminotomies were performed to adequately decompress the neural elements. Both the exiting and traversing nerve roots were decompressed on both sides until a coronary dilator passed easily along the nerve roots. Once the decompression was complete, I turned my attention to the posterior lower lumbar interbody fusion. The epidural venous vasculature was coagulated and cut sharply. Disc space was incised and the initial discectomy was performed with pituitary rongeurs. The disc space was distracted with sequential distractors to a height of 8 mm at L4-5 and 9 mm at L3-4. We then used a series of scrapers and shavers to prepare the endplates for fusion. The midline was prepared with Epstein curettes. Once  the complete discectomy was finished, we packed an appropriate sized peek interbody  cage with local autograft and morcellized allograft, gently retracted the nerve root, and tapped the cage into position at L3-4 and L4-5 bilaterally.  The midline between the cages was packed with morselized autograft and allograft. We then turned our attention to the placement of the lower pedicle screws. The pedicle screw entry zones were identified utilizing surface landmarks and fluoroscopy. I drilled into each pedicle utilizing the hand drill and EMG monitoring, and tapped each pedicle with the appropriate tap. We palpated with a ball probe to assure no break in the cortex. We then placed 5.5 x 30 mm pedicle screws into the pedicles bilaterally at L4 and L5. We then placed lordotic rods into the multiaxial screw heads of the pedicle screws and locked these in position with the locking caps and anti-torque device. We then checked our construct with AP and lateral fluoroscopy. Irrigated with copious amounts of bacitracin-containing saline solution. Placed a medium Hemovac drain through separate stab incision. Inspected the nerve roots once again to assure adequate decompression, lined to the dura with Gelfoam, and closed the muscle and the fascia with 0 Vicryl. Closed the subcutaneous tissues with 2-0 Vicryl and subcuticular tissues with 3-0 Vicryl. The skin was closed with benzoin and Steri-Strips. Dressing was then applied, the patient was awakened from general anesthesia and transported to the recovery room in stable condition. At the end of the procedure all sponge, needle and instrument counts were correct.   PLAN OF CARE: Admit to inpatient   PATIENT DISPOSITION:  PACU - hemodynamically stable.   Delay start of Pharmacological VTE agent (>24hrs) due to surgical blood loss or risk of bleeding:  yes

## 2013-08-12 NOTE — Evaluation (Signed)
Physical Therapy Evaluation Patient Details Name: Lisa [name] MRN: [id number] DOB: Dec 24, 1940 Today's Date: 08/12/2013 Time: 5621-3086 PT Time Calculation (min): 33 min  PT Assessment / Plan / Recommendation History of Present Illness  Patient is a 72 y.o. female admitted for PLIF L3-4, L4-5. Onset of symptoms was years  ago, gradually worsening since that time.  The pain is rated severe, and is located at the ow back and radiates to legs. The pain is described as aching and occurs all day. The symptoms have been progressive. Symptoms are exacerbated by exercise. 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  S/P L34/L45  PLIF  Clinical Impression  Patient is s/p lumbar fusion surgery resulting in functional limitations due to the deficits listed below (see PT Problem List). Initiated all back care and precautions.   Patient will benefit from skilled PT to increase their independence and safety with mobility to allow discharge to the venue listed below.       PT Assessment       Follow Up Recommendations  SNF    Does the patient have the potential to tolerate intense rehabilitation      Barriers to Discharge        Equipment Recommendations  None recommended by PT    Recommendations for Other Services     Frequency      Precautions / Restrictions Precautions Precautions: Back Required Braces or Orthoses: Spinal Brace Spinal Brace: Applied in sitting position Restrictions Weight Bearing Restrictions: Yes   Pertinent Vitals/Pain 6/10 back pain      Mobility  Bed Mobility Bed Mobility: Rolling Left;Left Sidelying to Sit;Sitting - Scoot to [place] of Bed Rolling Left: 4: Min assist;With rail Left Sidelying to Sit: 4: Min assist;HOB flat Sitting - Scoot to [place] of Bed: 4: Min guard Details for Bed Mobility Assistance: vc's for technique, min truncal assist Transfers Transfers: Sit to Stand;Stand to Sit Sit to Stand: 4: Min assist;From bed Stand to Sit: 4: Min assist;To  chair/3-in-1 Details for Transfer Assistance: vc's for hand placement Ambulation/Gait Ambulation/Gait Assistance: 4: Min guard Ambulation Distance (Feet): 100 Feet Assistive device: Rolling walker Ambulation/Gait Assistance Details: steady but guarded Gait Pattern: Step-through pattern Stairs: No    Exercises     PT Diagnosis:    PT Problem List:   PT Treatment Interventions:       PT Goals(Current goals can be found in the care plan section) Acute Rehab PT Goals Patient Stated Goal: I need to get well and able before I have to help my Mother PT Goal Formulation: With patient Time For Goal Achievement: 08/19/13 Potential to Achieve Goals: Good  Visit Information  Last PT Received On: 08/12/13 Assistance Needed: +1 History of Present Illness: Patient is a 72 y.o. female admitted for PLIF L3-4, L4-5. Onset of symptoms was years  ago, gradually worsening since that time.  The pain is rated severe, and is located at the ow back and radiates to legs. The pain is described as aching and occurs all day. The symptoms have been progressive. Symptoms are exacerbated by exercise. 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 Prior Function  Level of Independence: Independent Communication Communication: No difficulties    Cognition  Cognition Arousal/Alertness: Awake/alert Behavior During Therapy: WFL for tasks assessed/performed Overall Cognitive Status: Within Functional Limits for tasks assessed    Extremity/Trunk Assessment Lower Extremity Assessment Lower Extremity Assessment: Generalized  weakness;Overall WFL for tasks assessed   Balance    End of Session PT - End of Session Equipment Utilized During Treatment: Back brace Activity Tolerance: Patient tolerated treatment well Patient left: in chair;with call bell/phone within reach;with family/visitor present Nurse Communication: Mobility status  GP     Angelic Schnelle, Eliseo Gum 08/12/2013, 5:58 PM 08/12/2013  [place], PT [phone]  (pager)

## 2013-08-12 NOTE — H&P (Signed)
Subjective: Patient is a 72 y.o. female admitted for PLIF L3-4, L4-5. Onset of symptoms was years  ago, gradually worsening since that time.  The pain is rated severe, and is located at the ow back and radiates to legs. The pain is described as aching and occurs all day. The symptoms have been progressive. Symptoms are exacerbated by exercise. 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    Past Surgical History  Procedure Laterality Date  . Bladder repair    . Tonsillectomy    . Bladder suspension  90's  . Abdominal hysterectomy  1990's  . Multiple tooth extractions      wisdom teeth extractions     Prior to Admission medications   Medication Sig Start Date End Date Taking? Authorizing Provider  acetaminophen (TYLENOL) 500 MG tablet Take 500 mg by mouth every 6 (six) hours as needed for pain.   Yes Historical Provider, MD  Calcium Citrate (CITRACAL PO) Take 1 packet by mouth daily. Mixes with 8 ounces of liquid   Yes Historical Provider, MD  Cholecalciferol (VITAMIN D) 2000 UNITS tablet Take 2,000 Units by mouth daily.   Yes Historical Provider, MD  fenofibrate (TRICOR) 48 MG tablet Take 48 mg by mouth daily.   Yes Historical Provider, MD  fluticasone (FLONASE) 50 MCG/ACT nasal spray Place 2 sprays into the nose daily as needed. Allergic rhinitis.   Yes Historical Provider, MD  Glucosamine-Chondroitin (OSTEO BI-FLEX REGULAR STRENGTH PO) Take 1 tablet by mouth 2 (two) times daily.   Yes Historical Provider, MD  Multiple Vitamin (MULTIVITAMIN WITH MINERALS) TABS Take 1 tablet by mouth daily.   Yes Historical Provider, MD  Omega 3-6-9 Fatty Acids (GNP TRIPLE OMEGA COMPLEX) CPDR Take 1 tablet by mouth daily.   Yes Historical Provider, MD  simvastatin (ZOCOR) 40 MG tablet Take 40 mg by mouth daily.   Yes Historical Provider, MD  tretinoin (RETIN-A) 0.05 % cream Apply 1 application topically 2 (two) times daily.   Yes Historical Provider, MD  vitamin A palmitate 10000 UNIT capsule Take 10,000  Units by mouth daily.   Yes Historical Provider, MD  vitamin B-12 (CYANOCOBALAMIN) 1000 MCG tablet Take 1,000 mcg by mouth daily.   Yes Historical Provider, MD  cetirizine (ZYRTEC) 10 MG tablet Take 10 mg by mouth daily as needed for allergies.     Historical Provider, MD  naproxen sodium (ANAPROX) 220 MG tablet Take 440 mg by mouth 2 (two) times daily with a meal.    Historical Provider, MD   Allergies  Allergen Reactions  . Other     Anesthesia causes nausea and vomiting, memory difficulty and problems processing.  . Oxycodone Nausea Only  . Sulfa Antibiotics Rash    History  Substance Use Topics  . Smoking status: Never Smoker   . Smokeless tobacco: Never Used  . Alcohol Use: Yes     Comment: on occassion    History reviewed. No pertinent family history.   Review of Systems  Positive ROS: neg  All other systems have been reviewed and were otherwise negative with the exception of those mentioned in the HPI and as above.  Objective: Vital signs in last 24 hours: Temp:  [97.9 F (36.6 C)] 97.9 F (36.6 C) (08/22 0605) Pulse Rate:  [70] 70 (08/22 0605) Resp:  [16] 16 (08/22 0605) BP: (157)/(72) 157/72 mmHg (08/22 0605) SpO2:  [96 %] 96 % (08/22 0605)  General Appearance: Alert, cooperative, no distress, appears stated age Head: Normocephalic, without obvious abnormality, atraumatic  Eyes: PERRL, conjunctiva/corneas clear, EOM's intact    Neck: Supple, symmetrical, trachea midline Back: Symmetric, no curvature, ROM normal, no CVA tenderness Lungs:  respirations unlabored Heart: Regular rate and rhythm Abdomen: Soft, non-tender Extremities: Extremities normal, atraumatic, no cyanosis or edema Pulses: 2+ and symmetric all extremities Skin: Skin color, texture, turgor normal, no rashes or lesions  NEUROLOGIC:   Mental status: Alert and oriented x4,  no aphasia, good attention span, fund of knowledge, and memory Motor Exam - grossly normal Sensory Exam - grossly  normal Reflexes: 1+ Coordination - grossly normal Gait - grossly normal Balance - grossly normal Cranial Nerves: I: smell Not tested  II: visual acuity  OS: nl    OD: nl  II: visual fields Full to confrontation  II: pupils Equal, round, reactive to light  III,VII: ptosis None  III,IV,VI: extraocular muscles  Full ROM  V: mastication Normal  V: facial light touch sensation  Normal  V,VII: corneal reflex  Present  VII: facial muscle function - upper  Normal  VII: facial muscle function - lower Normal  VIII: hearing Not tested  IX: soft palate elevation  Normal  IX,X: gag reflex Present  XI: trapezius strength  5/5  XI: sternocleidomastoid strength 5/5  XI: neck flexion strength  5/5  XII: tongue strength  Normal    Data Review Lab Results  Component Value Date   WBC 6.2 08/05/2013   HGB 13.4 08/05/2013   HCT 38.1 08/05/2013   MCV 85.0 08/05/2013   PLT 223 08/05/2013   Lab Results  Component Value Date   NA 141 08/05/2013   K 3.9 08/05/2013   CL 105 08/05/2013   CO2 24 08/05/2013   BUN 20 08/05/2013   CREATININE 0.83 08/05/2013   GLUCOSE 91 08/05/2013   Lab Results  Component Value Date   INR 0.94 08/05/2013    Assessment/Plan: Patient admitted for PLIF L3-4, L4-5. Patient has failed a reasonable attempt at conservative therapy.  I explained the condition and procedure to the patient and answered any questions.  Patient wishes to proceed with procedure as planned. Understands risks/ benefits and typical outcomes of procedure.   Deuntae Kocsis S 08/12/2013 6:35 AM

## 2013-08-12 NOTE — Transfer of Care (Signed)
Immediate Anesthesia Transfer of Care Note  Patient: Erma Heritage Robarge  Procedure(s) Performed: Procedure(s) with comments: FOR MAXIMUM ACCESS (MAS) POSTERIOR LUMBAR INTERBODY FUSION (PLIF) 2 LEVEL (N/A) - FOR MAXIMUM ACCESS (MAS) POSTERIOR LUMBAR INTERBODY FUSION (PLIF) 2 LEVEL  Patient Location: PACU  Anesthesia Type:General  Level of Consciousness: awake and alert   Airway & Oxygen Therapy: Patient Spontanous Breathing and Patient connected to nasal cannula oxygen  Post-op Assessment: Report given to PACU RN and Post -op Vital signs reviewed and stable  Post vital signs: Reviewed and stable  Complications: No apparent anesthesia complications

## 2013-08-12 NOTE — Anesthesia Postprocedure Evaluation (Signed)
  Anesthesia Post-op Note  Patient: Lisa [name]  Procedure(s) Performed: Procedure(s) with comments: FOR MAXIMUM ACCESS (MAS) POSTERIOR LUMBAR INTERBODY FUSION (PLIF) 2 LEVEL (N/A) - FOR MAXIMUM ACCESS (MAS) POSTERIOR LUMBAR INTERBODY FUSION (PLIF) 2 LEVEL  Patient Location: PACU  Anesthesia Type:General  Level of Consciousness: awake, alert  and oriented  Airway and Oxygen Therapy: Patient Spontanous Breathing and Patient connected to nasal cannula oxygen  Post-op Pain: mild  Post-op Assessment: Post-op Vital signs reviewed, Patient's Cardiovascular Status Stable, Respiratory Function Stable, Patent Airway and Pain level controlled  Post-op Vital Signs: stable  Complications: No apparent anesthesia complications

## 2013-08-12 NOTE — Progress Notes (Signed)
UR COMPLETED  

## 2013-08-12 NOTE — Preoperative (Signed)
Beta Blockers   Reason not to administer Beta Blockers:Not Applicable 

## 2013-08-13 NOTE — Progress Notes (Signed)
Patient ID: Lisa [name], female   DOB: 01-27-1941, 72 y.o.   MRN: [id number] Subjective:  The patient is alert and pleasant. She looks well. She is in no apparent distress. She wants to go to [facility].  Objective: Vital signs in last 24 hours: Temp:  [97 F (36.1 C)-98.7 F (37.1 C)] 98.7 F (37.1 C) (08/23 0557) Pulse Rate:  [82-97] 82 (08/23 0557) Resp:  [10-20] 18 (08/23 0557) BP: (123-168)/(49-71) 139/71 mmHg (08/23 0557) SpO2:  [96 %-99 %] 98 % (08/23 0557) Weight:  [82.555 kg (182 lb)] 82.555 kg (182 lb) (08/22 1417)  Intake/Output from previous day: 08/22 0701 - 08/23 0700 In: 4570 [P.O.:240; I.V.:3055] Out: 1285 [Urine:985; Blood:300] Intake/Output this shift:    Physical exam patient is alert and oriented. She is moving her lower extremities well. Her dressing is clean and dry.  Lab Results: No results found for this basename: WBC, HGB, HCT, PLT,  in the last 72 hours BMET No results found for this basename: NA, K, CL, CO2, GLUCOSE, BUN, CREATININE, CALCIUM,  in the last 72 hours  Studies/Results: Dg Lumbar Spine 2-3 Views  08/12/2013   *RADIOLOGY REPORT*  Clinical Data: Spinal fusion.  DG C-ARM 61-120 MIN,LUMBAR SPINE - 2-3 VIEW  Technique:  C-arm fluoroscopic images were obtained intraoperatively and submitted for postoperative interpretation. Please see the performing provider's procedural report for the fluoroscopy time utilized.  Comparison:  Outside MRI 02/17/2013  Findings: Two intraoperative images demonstrate bilateral pedicle screw and rod fixation from L3-L5.  There are laminectomy defects at these levels.  Interbody devices at L3-L4 and L4-L5.  IMPRESSION: Spinal fusion from L3-L5.   Original Report Authenticated By: Richarda Overlie, M.D.   Dg Chest Port 1 View  08/12/2013   *RADIOLOGY REPORT*  Clinical Data: Status post central line placement  PORTABLE CHEST - 1 VIEW  Comparison: 08/05/2013  Findings: The overall inspiratory effort is poor  although no focal infiltrate is seen.  The cardiac shadow is within normal limits.  A left-sided central venous line is seen with its tip projected over the left innominate vein/SVC junction.  No pneumothorax is noted. No bony abnormality is seen.  IMPRESSION: Status post central line placement without pneumothorax.  Catheter tip is noted overlying the expected area of the proximal SVC.  These results were called by telephone on 08/12/2013 at 1204 hours to [facility], the patient's nurse, who verbally acknowledged these results.   Original Report Authenticated By: Alcide Clever, M.D.   Dg C-arm [phone] Min  08/12/2013   *RADIOLOGY REPORT*  Clinical Data: Spinal fusion.  DG C-ARM 61-120 MIN,LUMBAR SPINE - 2-3 VIEW  Technique:  C-arm fluoroscopic images were obtained intraoperatively and submitted for postoperative interpretation. Please see the performing provider's procedural report for the fluoroscopy time utilized.  Comparison:  Outside MRI 02/17/2013  Findings: Two intraoperative images demonstrate bilateral pedicle screw and rod fixation from L3-L5.  There are laminectomy defects at these levels.  Interbody devices at L3-L4 and L4-L5.  IMPRESSION: Spinal fusion from L3-L5.   Original Report Authenticated By: Richarda Overlie, M.D.    Assessment/Plan: Postop day 1: The patient is doing well. We'll will await placement. We will mobilize her.  LOS: 1 day     Lester Crickenberger D 08/13/2013, 9:45 AM

## 2013-08-13 NOTE — Progress Notes (Signed)
Placed pt. On cpap as per order. Pt. Is tolerating well at this time. 

## 2013-08-13 NOTE — Evaluation (Signed)
Occupational Therapy Evaluation Patient Details Name: Lisa [name] MRN: [id number] DOB: 1941-09-24 Today's Date: 08/13/2013 Time: 4098-1191 OT Time Calculation (min): 28 min  OT Assessment / Plan / Recommendation History of present illness Patient is a 72 y.o. female admitted for PLIF L3-4, L4-5. Onset of symptoms was years  ago, gradually worsening since that time.  The pain is rated severe, and is located at the ow back and radiates to legs. The pain is described as aching and occurs all day. The symptoms have been progressive. Symptoms are exacerbated by exercise. 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  S/P L34/L45  PLIF   Clinical Impression   Will continue to follow pt acutely in order to address below problem list.  Recommending SNF for d/c planning due to pt will need to be mod I before returning home to care for her mother.    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    Equipment Recommendations  3 in 1 bedside comode    Recommendations for Other Services    Frequency  Min 2X/week    Precautions / Restrictions Precautions Precautions: Back Required Braces or Orthoses: Spinal Brace Spinal Brace: Applied in sitting position   Pertinent Vitals/Pain See vitals    ADL       OT Diagnosis: Generalized weakness;Acute pain  OT Problem List: Decreased strength;Decreased activity tolerance;Decreased knowledge of use of DME or AE;Decreased knowledge of precautions;Pain OT Treatment Interventions: Self-care/ADL training;DME and/or AE instruction;Therapeutic activities;Patient/family education   OT Goals(Current goals can be found in the care plan section) Acute Rehab OT Goals Patient Stated Goal: I need to get well and able before I have to help my Mother OT Goal Formulation: With patient Time For Goal Achievement: 08/20/13 Potential to  Achieve Goals: Good  Visit Information  Assistance Needed: +1 History of Present Illness: Patient is a 72 y.o. female admitted for PLIF L3-4, L4-5. Onset of symptoms was years  ago, gradually worsening since that time.  The pain is rated severe, and is located at the ow back and radiates to legs. The pain is described as aching and occurs all day. The symptoms have been progressive. Symptoms are exacerbated by exercise. 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  S/P L34/L45  PLIF       Prior Functioning               Vision/Perception     Cognition  Cognition Arousal/Alertness: Awake/alert Behavior During Therapy: WFL for tasks assessed/performed Overall Cognitive Status: Within Functional Limits for tasks assessed    Extremity/Trunk Assessment       Mobility Bed Mobility Bed Mobility: Not assessed (but demo'd safe technique) Transfers Sit to Stand: 5: Supervision;From chair/3-in-1;With armrests;With upper extremity assist Stand to Sit: 5: Supervision;To chair/3-in-1;With armrests;With upper extremity assist Details for Transfer Assistance: vc's for hand placement     Exercise     Balance Balance Balance Assessed: No   End of Session OT - End of Session Equipment Utilized During Treatment: Gait belt;Rolling walker Activity Tolerance: Patient tolerated treatment well Patient left: in chair;with call bell/phone within reach Nurse Communication: Mobility status  GO    08/13/2013 Cipriano Mile OTR/L Pager [phone] Office [phone]  Cipriano Mile 08/13/2013, 12:26 PM

## 2013-08-13 NOTE — Progress Notes (Signed)
Physical Therapy Treatment Patient Details Name: Lisa [name] MRN: [id number] DOB: Apr 30, 1941 Today's Date: 08/13/2013 Time: 4098-1191 PT Time Calculation (min): 23 min  PT Assessment / Plan / Recommendation  History of Present Illness Patient is a 72 y.o. female admitted for PLIF L3-4, L4-5. Onset of symptoms was years  ago, gradually worsening since that time.  The pain is rated severe, and is located at the ow back and radiates to legs. The pain is described as aching and occurs all day. The symptoms have been progressive. Symptoms are exacerbated by exercise. 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  S/P L34/L45  PLIF   PT Comments   Improving daily,  Reinforced back care and precautions   Follow Up Recommendations  SNF     Does the patient have the potential to tolerate intense rehabilitation     Barriers to Discharge        Equipment Recommendations  None recommended by PT    Recommendations for Other Services    Frequency Min 5X/week   Progress towards PT Goals Progress towards PT goals: Progressing toward goals  Plan      Precautions / Restrictions Precautions Precautions: Back Required Braces or Orthoses: Spinal Brace Spinal Brace: Applied in sitting position   Pertinent Vitals/Pain     Mobility  Bed Mobility Bed Mobility: Not assessed (but demo'd safe technique) Transfers Transfers: Sit to Stand;Stand to Sit Sit to Stand: 5: Supervision;With upper extremity assist;From chair/3-in-1 Stand to Sit: 5: Supervision;With upper extremity assist;To chair/3-in-1 Details for Transfer Assistance: vc's for hand placement Ambulation/Gait Ambulation/Gait Assistance: 4: Min guard Ambulation Distance (Feet): 200 Feet Assistive device: Rolling walker Ambulation/Gait Assistance Details: generally steady Gait Pattern: Step-through pattern Stairs: Yes Stairs Assistance: 5: Supervision Stair Management Technique: One rail Right;Step to pattern;Alternating  pattern;Forwards Number of Stairs: 4    Exercises     PT Diagnosis:    PT Problem List:   PT Treatment Interventions:     PT Goals (current goals can now be found in the care plan section) Acute Rehab PT Goals Time For Goal Achievement: 08/19/13 Potential to Achieve Goals: Good  Visit Information  Last PT Received On: 08/13/13 Assistance Needed: +1 History of Present Illness: Patient is a 72 y.o. female admitted for PLIF L3-4, L4-5. Onset of symptoms was years  ago, gradually worsening since that time.  The pain is rated severe, and is located at the ow back and radiates to legs. The pain is described as aching and occurs all day. The symptoms have been progressive. Symptoms are exacerbated by exercise. 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  S/P L34/L45  PLIF    Subjective Data  Subjective: I know I need to go to [place]  for a short time, I can not keep my mom right now.   Cognition  Cognition Arousal/Alertness: Awake/alert Behavior During Therapy: WFL for tasks assessed/performed Overall Cognitive Status: Within Functional Limits for tasks assessed    Balance  Balance Balance Assessed: No  End of Session PT - End of Session Equipment Utilized During Treatment: Back brace Activity Tolerance: Patient tolerated treatment well Patient left: in chair;with call bell/phone within reach;with family/visitor present Nurse Communication: Mobility status   GP     Pranav Lince, Eliseo Gum 08/13/2013, 11:33 AM  08/13/2013  [place] Bing, PT [phone]  (pager)

## 2013-08-13 NOTE — Progress Notes (Signed)
Ambulated two full laps around the unit with this RN today (not including her lap with therapy). Tolerated very well

## 2013-08-13 NOTE — Progress Notes (Signed)
Occupational Therapy Treatment Patient Details Name: Lisa [name] MRN: [id number] DOB: 04-12-1941 Today's Date: 08/13/2013 Time: 4098-1191 OT Time Calculation (min): 24 min  OT Assessment / Plan / Recommendation  History of present illness     OT comments  Focus of session on AE education. Pt very appreciative and demo'd good understanding of AE use.  Follow Up Recommendations  SNF    Barriers to Discharge       Equipment Recommendations  3 in 1 bedside comode    Recommendations for Other Services    Frequency Min 2X/week   Progress towards OT Goals Progress towards OT goals: Progressing toward goals  Plan Discharge plan remains appropriate    Precautions / Restrictions Precautions Precautions: Back   Pertinent Vitals/Pain See vitals    ADL  Equipment Used: Long-handled sponge;Reacher (toilet aid) ADL Comments: Pt in bed but eager to receive AE education. Pt with questions about toileting hygiene due to concern that she is twisting when attempting back peri care. Demo'd use of toilet aid (green tongs), reacher, and long handled sponge to use in order to increased independence with self care.  Pt verbalized understanding and very appreciative of information.  Educated pt on acquisition of AE, and she states she will have her family members purchase them so she can use them while in hospital and at [facility].      OT Diagnosis:    OT Problem List:   OT Treatment Interventions:     OT Goals(current goals can now be found in the care plan section) ADL Goals Pt Will Perform Lower Body Bathing: with modified independence;sit to/from stand;with adaptive equipment Pt Will Perform Lower Body Dressing: with modified independence;with adaptive equipment;sit to/from stand Pt Will Transfer to Toilet: with modified independence;ambulating Pt Will Perform Toileting - Clothing Manipulation and hygiene: with modified independence;sit to/from stand Additional ADL Goal #1: Pt will perform bed  mobility at mod I level as precursor for EOB ADLs. Additional ADL Goal #2: Pt will independently verbalize and maintain 3/3 back precautions during all ADLs.  Visit Information  Last OT Received On: 08/13/13 Assistance Needed: +1    Subjective Data      Prior Functioning       Cognition  Cognition Arousal/Alertness: Awake/alert Behavior During Therapy: WFL for tasks assessed/performed Overall Cognitive Status: Within Functional Limits for tasks assessed    Mobility  Bed Mobility Bed Mobility: Rolling Left;Left Sidelying to Sit;Sit to Sidelying Left Rolling Left: 4: Min guard Left Sidelying to Sit: 4: Min guard Sitting - Scoot to [place] of Bed: 4: Min guard Sit to Sidelying Left: 4: Min assist Details for Bed Mobility Assistance: VCs for log roll technique.    Exercises      Balance     End of Session OT - End of Session Activity Tolerance: Patient tolerated treatment well Patient left: in bed;with call bell/phone within reach;with family/visitor present  GO    08/13/2013 Cipriano Mile OTR/L Pager [phone] Office [phone]  Cipriano Mile 08/13/2013, 4:55 PM

## 2013-08-14 MED ORDER — SODIUM CHLORIDE 0.9 % IJ SOLN
10.0000 mL | INTRAMUSCULAR | Status: DC | PRN
  Administered 2013-08-14 – 2013-08-15 (×2): 20 mL
  Administered 2013-08-16 (×2): 10 mL
  Administered 2013-08-17: 20 mL

## 2013-08-14 NOTE — Progress Notes (Addendum)
RT. Responded to assist pt. With her cpap. RN had already placed cpap on for pt. Pt tolerating well at this time.

## 2013-08-14 NOTE — Progress Notes (Signed)
Physical Therapy Treatment Patient Details Name: Lisa [name] MRN: [id number] DOB: [birth year] Today's Date: 08/14/2013 Time: 0821-0839 PT Time Calculation (min): 18 min  PT Assessment / Plan / Recommendation  History of Present Illness Patient is a 72 y.o. female admitted for PLIF L3-4, L4-5. Onset of symptoms was years  ago, gradually worsening since that time.  The pain is rated severe, and is located at the ow back and radiates to legs. The pain is described as aching and occurs all day. The symptoms have been progressive. Symptoms are exacerbated by exercise. 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  S/P L34/L45  PLIF   PT Comments   Pt making good progress.    Follow Up Recommendations  SNF     Does the patient have the potential to tolerate intense rehabilitation     Barriers to Discharge        Equipment Recommendations  None recommended by PT    Recommendations for Other Services    Frequency Min 5X/week   Progress towards PT Goals Progress towards PT goals: Progressing toward goals  Plan Current plan remains appropriate    Precautions / Restrictions Precautions Precautions: Back Required Braces or Orthoses: Spinal Brace Spinal Brace: Applied in sitting position Restrictions Weight Bearing Restrictions: No   Pertinent Vitals/Pain 6/10 low back. Did not want pain meds at this time.      Mobility  Bed Mobility Bed Mobility: Not assessed (pt able to verbally state steps,did not want to get into bed) Transfers Transfers: Sit to Stand;Stand to Sit Sit to Stand: 5: Supervision;With upper extremity assist;From chair/3-in-1 Stand to Sit: 5: Supervision;With upper extremity assist;To chair/3-in-1 Details for Transfer Assistance: movements slow Ambulation/Gait Ambulation/Gait Assistance: 5: Supervision Ambulation Distance (Feet): 200 Feet Assistive device: Rolling walker Ambulation/Gait Assistance Details: no balance losses with RW Gait Pattern: Step-through  pattern Gait velocity: slower than WNL Stairs: No    Exercises General Exercises - Lower Extremity Ankle Circles/Pumps: AROM;Both;5 reps;Other (comment) (Instructed to do throughout the day)   PT Diagnosis:    PT Problem List:   PT Treatment Interventions:     PT Goals (current goals can now be found in the care plan section) Acute Rehab PT Goals Time For Goal Achievement: 08/19/13 Potential to Achieve Goals: Good  Visit Information  Last PT Received On: 08/14/13 Assistance Needed: +1 History of Present Illness: Patient is a 72 y.o. female admitted for PLIF L3-4, L4-5. Onset of symptoms was years  ago, gradually worsening since that time.  The pain is rated severe, and is located at the ow back and radiates to legs. The pain is described as aching and occurs all day. The symptoms have been progressive. Symptoms are exacerbated by exercise. 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  S/P L34/L45  PLIF    Subjective Data  Subjective: Pt very concerned about returning home alone and then also caring for elderly mother   Cognition  Cognition Arousal/Alertness: Awake/alert Behavior During Therapy: WFL for tasks assessed/performed Overall Cognitive Status: Within Functional Limits for tasks assessed    Balance  Balance Balance Assessed: No  End of Session PT - End of Session Equipment Utilized During Treatment: Back brace;Gait belt Activity Tolerance: Patient tolerated treatment well Patient left: in chair;with call bell/phone within reach Nurse Communication: Mobility status (Can ambulate with RW and daughter)   GP     Donnella Sham 08/14/2013, 9:00 AM [place], PT  [phone] 08/14/2013

## 2013-08-15 ENCOUNTER — Encounter (HOSPITAL_COMMUNITY): Payer: Self-pay | Admitting: Neurological Surgery

## 2013-08-15 MED ORDER — SENNA 8.6 MG PO TABS
1.0000 | ORAL_TABLET | Freq: Every day | ORAL | Status: DC | PRN
  Administered 2013-08-16: 8.6 mg via ORAL
  Filled 2013-08-15: qty 1

## 2013-08-15 NOTE — Progress Notes (Signed)
Physical Therapy Treatment Patient Details Name: Lisa [name] MRN: [id number] DOB: 05/04/1941 Today's Date: 08/15/2013 Time: 4098-1191 PT Time Calculation (min): 19 min  PT Assessment / Plan / Recommendation  History of Present Illness Patient is a 72 y.o. female admitted for PLIF L3-4, L4-5. Onset of symptoms was years  ago, gradually worsening since that time.  The pain is rated severe, and is located at the ow back and radiates to legs. The pain is described as aching and occurs all day. The symptoms have been progressive. Symptoms are exacerbated by exercise. 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  S/P L34/L45  PLIF   PT Comments   Improving daily.  Reinforced all back care/precaution.  Made pt aware of how often she is breaking her precautions.   Follow Up Recommendations  SNF     Does the patient have the potential to tolerate intense rehabilitation     Barriers to Discharge        Equipment Recommendations  None recommended by PT    Recommendations for Other Services    Frequency Min 5X/week   Progress towards PT Goals Progress towards PT goals: Progressing toward goals  Plan Current plan remains appropriate    Precautions / Restrictions Precautions Precautions: Back Required Braces or Orthoses: Spinal Brace Spinal Brace: Applied in sitting position Restrictions Weight Bearing Restrictions: No   Pertinent Vitals/Pain     Mobility  Bed Mobility Bed Mobility: Not assessed Transfers Transfers: Sit to Stand;Stand to Sit Sit to Stand: 5: Supervision;With upper extremity assist;From chair/3-in-1 Stand to Sit: 5: Supervision;With upper extremity assist;To chair/3-in-1 Details for Transfer Assistance: slow, but safe Ambulation/Gait Ambulation/Gait Assistance: 5: Supervision Ambulation Distance (Feet): 400 Feet Assistive device: Rolling walker Ambulation/Gait Assistance Details: steady with RW and able to vary speed appreciably Gait Pattern: Within Functional  Limits Gait velocity: slower than WNL Stairs: No    Exercises     PT Diagnosis:    PT Problem List:   PT Treatment Interventions:     PT Goals (current goals can now be found in the care plan section) Acute Rehab PT Goals Patient Stated Goal: I need to get well and able before I have to help my Mother PT Goal Formulation: With patient Time For Goal Achievement: 08/19/13 Potential to Achieve Goals: Good  Visit Information  Last PT Received On: 08/15/13 Assistance Needed: +1 History of Present Illness: Patient is a 72 y.o. female admitted for PLIF L3-4, L4-5. Onset of symptoms was years  ago, gradually worsening since that time.  The pain is rated severe, and is located at the ow back and radiates to legs. The pain is described as aching and occurs all day. The symptoms have been progressive. Symptoms are exacerbated by exercise. 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  S/P L34/L45  PLIF    Subjective Data  Patient Stated Goal: I need to get well and able before I have to help my Mother   Cognition  Cognition Arousal/Alertness: Awake/alert Behavior During Therapy: WFL for tasks assessed/performed Overall Cognitive Status: Within Functional Limits for tasks assessed    Balance     End of Session PT - End of Session Equipment Utilized During Treatment: Back brace Activity Tolerance: Patient tolerated treatment well Patient left: in chair;with call bell/phone within reach;with family/visitor present Nurse Communication: Mobility status   GP     Nyrah Demos, Eliseo Gum 08/15/2013, 4:37 PM 08/15/2013  [place] Bing, PT [phone]  (pager)

## 2013-08-15 NOTE — Progress Notes (Signed)
Pt can place on/off CPAP herself. Pt encouraged to call RT if any assistance is needed. No distress noted.

## 2013-08-15 NOTE — Progress Notes (Signed)
Occupational Therapy Treatment Patient Details Name: Lisa [name] MRN: [id number] DOB: October 10, 1941 Today's Date: 08/15/2013 Time: 1191-4782 OT Time Calculation (min): 18 min  OT Assessment / Plan / Recommendation  History of present illness Patient is a 72 y.o. female admitted for PLIF L3-4, L4-5. Onset of symptoms was years  ago, gradually worsening since that time.  The pain is rated severe, and is located at the ow back and radiates to legs. The pain is described as aching and occurs all day. The symptoms have been progressive. Symptoms are exacerbated by exercise. 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  S/P L34/L45  PLIF   OT comments  Pt educated with family present on toilet aid options. Pt pending d/c to SNF possibly tomorrow. Pt progressing well. Pt with AE to incr independence with adls. Pt with min v/c to avoid twisting.  Follow Up Recommendations  SNF    Barriers to Discharge       Equipment Recommendations  3 in 1 bedside comode    Recommendations for Other Services    Frequency Min 2X/week   Progress towards OT Goals Progress towards OT goals: Progressing toward goals  Plan Discharge plan remains appropriate    Precautions / Restrictions Precautions Precautions: Back Required Braces or Orthoses: Spinal Brace Spinal Brace: Applied in sitting position   Pertinent Vitals/Pain No pain reported    ADL  Upper Body Dressing: Modified independent Where Assessed - Upper Body Dressing: Unsupported sitting (educated on donning brace) Toileting - Clothing Manipulation and Hygiene:  (see below) Equipment Used: Rolling walker;Back brace Transfers/Ambulation Related to ADLs: pt observed ambulating back into room with pt and complete stand <> sit ADL Comments: pt educated and family present to see toilet aide options x 3. familynto purchase grill tongs. OT to follow up with gift shop regarding toilet tongs being abscent. Pt very appreciative. Pt educated on don brace over  head to avoid twisting. pt states "OTs are so creative" No further questions at this time. Pt with AE purchased in room    OT Diagnosis:    OT Problem List:   OT Treatment Interventions:     OT Goals(current goals can now be found in the care plan section) Acute Rehab OT Goals Patient Stated Goal: I need to get well and able before I have to help my Mother OT Goal Formulation: With patient Time For Goal Achievement: 08/20/13 Potential to Achieve Goals: Good ADL Goals Pt Will Perform Lower Body Bathing: with modified independence;sit to/from stand;with adaptive equipment Pt Will Perform Lower Body Dressing: with modified independence;with adaptive equipment;sit to/from stand Pt Will Transfer to Toilet: with modified independence;ambulating Pt Will Perform Toileting - Clothing Manipulation and hygiene: with modified independence;sit to/from stand Additional ADL Goal #1: Pt will perform bed mobility at mod I level as precursor for EOB ADLs. Additional ADL Goal #2: Pt will independently verbalize and maintain 3/3 back precautions during all ADLs.  Visit Information  Last OT Received On: 08/15/13 Assistance Needed: +1 History of Present Illness: Patient is a 72 y.o. female admitted for PLIF L3-4, L4-5. Onset of symptoms was years  ago, gradually worsening since that time.  The pain is rated severe, and is located at the ow back and radiates to legs. The pain is described as aching and occurs all day. The symptoms have been progressive. Symptoms are exacerbated by exercise. 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  S/P L34/L45  PLIF    Subjective Data      Prior Functioning  Cognition  Cognition Arousal/Alertness: Awake/alert Behavior During Therapy: WFL for tasks assessed/performed Overall Cognitive Status: Within Functional Limits for tasks assessed    Mobility  Transfers Sit to Stand: 5: Supervision;With upper extremity assist;From chair/3-in-1 Stand to Sit: 5:  Supervision;With upper extremity assist;To chair/3-in-1    Exercises      Balance     End of Session OT - End of Session Activity Tolerance: Patient tolerated treatment well Patient left: in chair;with call bell/phone within reach Nurse Communication: Mobility status;Precautions  GO     Lucile Shutters 08/15/2013, 4:26 PM Pager: [phone]

## 2013-08-15 NOTE — Progress Notes (Signed)
Patient ID: Lisa [name], female   DOB: 1941-01-07, 72 y.o.   MRN: [id number] Alert and oriented x 4 Moving all extremities well Wound is clean, dry, and without signs of infection Drain removed. Await placement.

## 2013-08-16 MED ORDER — BISACODYL 10 MG RE SUPP
10.0000 mg | Freq: Every day | RECTAL | Status: DC | PRN
  Administered 2013-08-16: 10 mg via RECTAL
  Filled 2013-08-16: qty 1

## 2013-08-16 MED ORDER — METHOCARBAMOL 500 MG PO TABS: 500.0000 mg | ORAL_TABLET | Freq: Four times a day (QID) | ORAL | Status: DC | PRN

## 2013-08-16 MED ORDER — FLEET ENEMA 7-19 GM/118ML RE ENEM: 1.0000 | ENEMA | Freq: Every day | RECTAL | Status: DC | PRN

## 2013-08-16 MED ORDER — HYDROCODONE-ACETAMINOPHEN 5-325 MG PO TABS: 1.0000 | ORAL_TABLET | ORAL | Status: DC | PRN

## 2013-08-16 MED FILL — Sodium Chloride IV Soln 0.9%: INTRAVENOUS | Qty: 1000 | Status: AC

## 2013-08-16 MED FILL — Heparin Sodium (Porcine) Inj 1000 Unit/ML: INTRAMUSCULAR | Qty: 30 | Status: AC

## 2013-08-16 NOTE — Progress Notes (Signed)
Physical Therapy Treatment Patient Details Name: Lisa [name] MRN: [id number] DOB: September 25, 1941 Today's Date: 08/16/2013 Time: 1478-2956 PT Time Calculation (min): 23 min  PT Assessment / Plan / Recommendation  History of Present Illness Patient is a 72 y.o. female admitted for PLIF L3-4, L4-5. Onset of symptoms was years  ago, gradually worsening since that time.  The pain is rated severe, and is located at the ow back and radiates to legs. The pain is described as aching and occurs all day. The symptoms have been progressive. Symptoms are exacerbated by exercise. 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  S/P L34/L45  PLIF   PT Comments   Pt educated in general body mechanics with mobility and ADL's to help her to better adhere to her back precautions (such as with feeding her dog, cooking, dish/everyday item placement at waist height, ect). Pt able to return demo with min cues. Handouts provided as well. Pt making steady progress toward her goals.   Follow Up Recommendations  SNF     Equipment Recommendations  None recommended by PT    Frequency Min 5X/week   Progress towards PT Goals Progress towards PT goals: Progressing toward goals  Plan Current plan remains appropriate    Precautions / Restrictions Precautions Precautions: Back Precaution Comments: Pt able to verbally recall 3/3 back precautions, needs cues to demo with mobility Required Braces or Orthoses: Spinal Brace Spinal Brace: Applied in sitting position    Mobility  Bed Mobility Bed Mobility: Not assessed Transfers Sit to Stand: 5: Supervision;With upper extremity assist;From chair/3-in-1 Stand to Sit: 5: Supervision;With upper extremity assist;To chair/3-in-1 Details for Transfer Assistance: incr time needed with transfers,demo'd safe technique Ambulation/Gait Ambulation/Gait Assistance: 5: Supervision Ambulation Distance (Feet): 400 Feet Assistive device: Rolling walker Gait Pattern: Step-through  pattern;Decreased stride length;Narrow base of support Gait velocity: Decreased Stairs Assistance: 5: Supervision Stair Management Technique: Two rails;Step to pattern;Forwards Number of Stairs: 5      PT Goals (current goals can now be found in the care plan section) Acute Rehab PT Goals Patient Stated Goal: I need to get well and able before I have to help my Lisa [name] PT Goal Formulation: With patient Time For Goal Achievement: 08/19/13 Potential to Achieve Goals: Good  Visit Information  Last PT Received On: 08/16/13 Assistance Needed: +1 History of Present Illness: Patient is a 72 y.o. female admitted for PLIF L3-4, L4-5. Onset of symptoms was years  ago, gradually worsening since that time.  The pain is rated severe, and is located at the ow back and radiates to legs. The pain is described as aching and occurs all day. The symptoms have been progressive. Symptoms are exacerbated by exercise. 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  S/P L34/L45  PLIF    Subjective Data  Patient Stated Goal: I need to get well and able before I have to help my Lisa [name]   Cognition  Cognition Arousal/Alertness: Awake/alert Behavior During Therapy: WFL for tasks assessed/performed Overall Cognitive Status: Within Functional Limits for tasks assessed       End of Session PT - End of Session Equipment Utilized During Treatment: Gait belt;Back brace Activity Tolerance: Patient tolerated treatment well Patient left: in chair;with call bell/phone within reach;with family/visitor present Nurse Communication: Mobility status   GP     Sallyanne Kuster 08/16/2013, 1:07 PM  Sallyanne Kuster, PTA Office- [phone]

## 2013-08-16 NOTE — Clinical Social Work Psychosocial (Signed)
Clinical Social Work Department BRIEF PSYCHOSOCIAL ASSESSMENT 08/16/2013  Patient:  Lisa [name],Lisa [name]     Account Number:  [id number]     Admit date:  08/12/2013  Clinical Social Worker:  Sherre Lain  Date/Time:  08/16/2013 06:07 PM  Referred by:  Physician  Date Referred:  08/16/2013 Referred for  SNF Placement   Other Referral:   none.   Interview type:  Patient Other interview type:   none.    PSYCHOSOCIAL DATA Living Status:  WITH DISABLED ADULT Admitted from facility:   Level of care:   Primary support name:  none. Primary support relationship to patient:   Degree of support available:   Pt stated that she is the primary support for herself and her mother.    CURRENT CONCERNS Current Concerns  Post-Acute Placement   Other Concerns:   none.    SOCIAL WORK ASSESSMENT / PLAN CSW met with pt at bedside. Pt stated that prior to being admitted to [facility], she visited SNF in [place]. Pt stated that she lives at home with her mother, whom she cares for entirely. Pt noted that she knew she would need SNF placement upon being medically discharged for her mother's safety and her own. Pt expressed an interest in receiving rehabilitation with [facility]. CSW to continue to follow and assist with pt's discharge planning needs.   Assessment/plan status:  Psychosocial Support/Ongoing Assessment of Needs Other assessment/ plan:   none.   Information/referral to community resources:   [facility] placement.    PATIENTS/FAMILYS RESPONSE TO PLAN OF CARE: Pt was agreeable and understanding of CSW plan of care.       Darlyn Chamber, MSW, LCSWA Clinical Social Work [phone]

## 2013-08-16 NOTE — Clinical Social Work Placement (Addendum)
Clinical Social Work Department CLINICAL SOCIAL WORK PLACEMENT NOTE 08/18/2013  Patient:  [name],Lisa A  Account Number:  [id number] Admit date:  08/12/2013  Clinical Social Worker:  Irving Burton [place], LCSWA  Date/time:  08/16/2013 06:17 PM  Clinical Social Work is seeking post-discharge placement for this patient at the following level of care:   SKILLED NURSING   (*CSW will update this form in Epic as items are completed)   08/16/2013  Patient/family provided with [facility] Department of Clinical Social Works list of facilities offering this level of care within the geographic area requested by the patient (or if unable, by the patients family).  08/16/2013  Patient/family informed of their freedom to choose among providers that offer the needed level of care, that participate in Medicare, Medicaid or managed care program needed by the patient, have an available bed and are willing to accept the patient.  08/16/2013  Patient/family informed of [facility] ownership interest in [facility], as well as of the fact that they are under no obligation to receive care at this facility.  PASARR submitted to EDS on 08/16/2013 PASARR number received from EDS on 08/16/2013  FL2 transmitted to all facilities in geographic area requested by pt/family on  08/16/2013 FL2 transmitted to all facilities within larger geographic area on   Patient informed that his/her managed care company has contracts with or will negotiate with  certain facilities, including the following:     Patient/family informed of bed offers received:  08/17/2013 [name]  [name] on  08/18/2013 [name]  The following physician request were entered in Epic:   Additional Comments:  Darlyn Chamber, MSW, LCSWA Clinical Social  Work [phone]

## 2013-08-16 NOTE — Discharge Summary (Signed)
Physician Discharge Summary  Patient ID: Lisa [name] MRN: [id number] DOB/AGE: June 24, 1941 72 y.o.  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 *   Discharged Condition: good  Hospital Course: She was admitted to undergo surgical decompression arthrodesis at L3-4 L4-5. She tolerated the procedure well however because of independent living needs and the need for further convalescence before she returns home she is being transferred to skilled nursing facility.  Consults: None  Significant Diagnostic Studies: None  Treatments: surgery: Laminectomy L3-4 L4-5 decompression of L3-L4 and L5 nerve roots posterior interbody arthrodesis with peek spacers local autograft and allograft segmental fixation L3-L5  Discharge Exam: Blood pressure 148/64, pulse 68, temperature 97.9 F (36.6 C), temperature source Oral, resp. rate 20, height 5\' 1"  (1.549 m), weight 82.555 kg (182 lb), SpO2 94.00%. Incision is clean and dry motor function is intact in iliopsoas quadriceps tibialis anterior and gastrocs station and gait is intact  Disposition: Skilled nursing facility, [facility]  Discharge Orders   Future Orders Complete By Expires   Diet - low sodium heart healthy  As directed    Increase activity slowly  As directed        Medication List         acetaminophen 500 MG tablet  Commonly known as:  TYLENOL  Take 500 mg by mouth every 6 (six) hours as needed for pain.     cetirizine 10 MG tablet  Commonly known as:  ZYRTEC  Take 10 mg by mouth daily as needed for allergies.     CITRACAL PO  Take 1 packet by mouth daily. Mixes with 8 ounces of liquid     fenofibrate 48 MG tablet  Commonly known as:  TRICOR  Take 48 mg by mouth  daily.     fluticasone 50 MCG/ACT nasal spray  Commonly known as:  FLONASE  Place 2 sprays into the nose daily as needed. Allergic rhinitis.     GNP TRIPLE OMEGA COMPLEX Cpdr  Take 1 tablet by mouth daily.     HYDROcodone-acetaminophen 5-325 MG per tablet  Commonly known as:  NORCO/VICODIN  Take 1-2 tablets by mouth every 4 (four) hours as needed.     methocarbamol 500 MG tablet  Commonly known as:  ROBAXIN  Take 1 tablet (500 mg total) by mouth every 6 (six) hours as needed.     multivitamin with minerals Tabs tablet  Take 1 tablet by mouth daily.     naproxen sodium 220 MG tablet  Commonly known as:  ANAPROX  Take 440 mg by mouth 2 (two) times daily with a meal.     OSTEO BI-FLEX REGULAR STRENGTH PO  Take 1 tablet by mouth 2 (two) times daily.     simvastatin 40 MG tablet  Commonly known as:  ZOCOR  Take 40 mg by mouth daily.     tretinoin 0.05 % cream  Commonly known as:  RETIN-A  Apply 1 application topically 2 (two) times daily.     vitamin A palmitate 10000 UNIT capsule  Take 10,000 Units by mouth daily.     vitamin B-12 1000 MCG tablet  Commonly known as:  CYANOCOBALAMIN  Take 1,000 mcg by mouth daily.     Vitamin D 2000 UNITS tablet  Take 2,000 Units by mouth daily.  SignedStefani Dama 08/16/2013, 6:40 PM

## 2013-08-16 NOTE — Progress Notes (Signed)
Pt will place on mask when ready. Pt encouraged to call RT if needing any assistance. No distress noted.

## 2013-08-17 NOTE — Clinical Social Work Note (Signed)
CSW received a call from pt's insurance this morning. Insurance company stating that they are having to pass pt's case to their Medical Director due to pt ambulating well. CSW informed insurance of pt's home situation and the concern for safety once pt returns home. Pt will be continuing to care for her elderly mother once pt is medically discharged home. Case Management has been informed of information above.  CSW spoke with pt at bedside. Pt is concerned about discharging home with fear that she won't be able to care for herself. CSW will contact MD to inform them of information above.  CSW to continue to follow and assist with pt discharge planning needs.  Darlyn Chamber, MSW, LCSWA Clinical Social Work [phone]

## 2013-08-18 NOTE — Clinical Social Work Note (Signed)
Disposition for pt's discharge showing home with home health. Pt to be discharged to SNF [facility]). CSW has notified MD. MD unable to access eipc due to surgical schedule on 08/18/2013. MD to make appropriate changes to discharge summary via hardcopy, per MD request. CSW to follow up with [facility] for approval.   Darlyn Chamber, MSW, LCSWA Clinical Social Work [phone]

## 2013-08-18 NOTE — Progress Notes (Signed)
Per MD order, central line removed. IV cathter intact. Vaseline pressure gauze to site, pressure held x 5 min, no bleeding to site. Pt instructed not to get out of bed for 30 min after the removal of the central line. Instucted to keep dressing CDI x 24hours, if bleeding occurs hold pressure, if bleeding does not stop contact MD or go to the ED. Pt verbalized understanding all questions answered. Consuello Masse

## 2013-08-18 NOTE — Progress Notes (Signed)
PT Cancellation Note  Patient Details Name: Lisa [name] MRN: [id number] DOB: [birth year]   Cancelled Treatment:    Reason Eval/Treat Not Completed: Patient declined, no reason specified.  Pt stated her family was coming to pick her up to d/c to [place]. 08/18/2013  [place] Bing, PT [phone]  (pager)   [name], Eliseo Gum 08/18/2013, 2:41 PM

## 2013-08-18 NOTE — Progress Notes (Signed)
Discharge summary and prescriptions sent with patient. Patient appears in no distress at this time. All belongings sent with patient and family.   Sim Boast, RN 08/18/13 1530

## 2013-08-18 NOTE — Discharge Summary (Signed)
Physician Discharge Summary  Patient ID: Lisa [name] MRN: [id number] DOB/AGE: 71-24-1942 71 y.o.  Admit date: 08/12/2013 Discharge date: 08/18/2013  Admission Diagnoses: lumbar stenosis with instability    Discharge Diagnoses: same   Discharged Condition: good  Hospital Course: The patient was admitted on 08/12/2013 and taken to the operating room where the patient underwent PLIF L3-4, L4-5. The patient tolerated the procedure well and was taken to the recovery room and then to the floor in stable condition. The hospital course was routine. There were no complications. The wound remained clean dry and intact. Pt had appropriate back soreness. No complaints of leg pain or new N/T/W. The patient remained afebrile with stable vital signs, and tolerated a regular diet. The patient continued to increase activities, and pain was well controlled with oral pain medications.   Consults: None  Significant Diagnostic Studies:  Results for orders placed during the hospital encounter of 08/05/13  SURGICAL PCR SCREEN      Result Value Range   MRSA, PCR POSITIVE (*) NEGATIVE   Staphylococcus aureus POSITIVE (*) NEGATIVE  BASIC METABOLIC PANEL      Result Value Range   Sodium 141  135 - 145 mEq/L   Potassium 3.9  3.5 - 5.1 mEq/L   Chloride 105  96 - 112 mEq/L   CO2 24  19 - 32 mEq/L   Glucose, Bld 91  70 - 99 mg/dL   BUN 20  6 - 23 mg/dL   Creatinine, Ser 3.66  0.50 - 1.10 mg/dL   Calcium 9.9  8.4 - 44.0 mg/dL   GFR calc non Af Amer 69 (*) >90 mL/min   GFR calc Af Amer 80 (*) >90 mL/min  CBC WITH DIFFERENTIAL      Result Value Range   WBC 6.2  4.0 - 10.5 K/uL   RBC 4.48  3.87 - 5.11 MIL/uL   Hemoglobin 13.4  12.0 - 15.0 g/dL   HCT 34.7  42.5 - 95.6 %   MCV 85.0  78.0 - 100.0 fL   MCH 29.9  26.0 - 34.0 pg   MCHC 35.2  30.0 - 36.0 g/dL   RDW 38.7  56.4 - 33.2 %   Platelets 223  150 - 400 K/uL   Neutrophils Relative % 58  43 - 77 %   Neutro Abs 3.6  1.7 - 7.7 K/uL   Lymphocytes  Relative 32  12 - 46 %   Lymphs Abs 2.0  0.7 - 4.0 K/uL   Monocytes Relative 6  3 - 12 %   Monocytes Absolute 0.4  0.1 - 1.0 K/uL   Eosinophils Relative 3  0 - 5 %   Eosinophils Absolute 0.2  0.0 - 0.7 K/uL   Basophils Relative 1  0 - 1 %   Basophils Absolute 0.1  0.0 - 0.1 K/uL  PROTIME-INR      Result Value Range   Prothrombin Time 12.4  11.6 - 15.2 seconds   INR 0.94  0.00 - 1.49  TYPE AND SCREEN      Result Value Range   ABO/RH(D) A POS     Antibody Screen NEG     Sample Expiration 08/19/2013    ABO/RH      Result Value Range   ABO/RH(D) A POS      Chest 2 View  08/05/2013   *RADIOLOGY REPORT*  Clinical Data: Hypertension, preoperative evaluation for back surgery  CHEST - 2 VIEW  Comparison: None.  Findings: The heart and pulmonary  vascularity are within normal limits.  The lungs are clear bilaterally.  No acute bony abnormality is seen.  A nipple shadow is noted overlying the left lung base.  IMPRESSION: No acute abnormality noted.   Original Report Authenticated By: Alcide Clever, M.D.   Dg Lumbar Spine 2-3 Views  08/12/2013   *RADIOLOGY REPORT*  Clinical Data: Spinal fusion.  DG C-ARM 61-120 MIN,LUMBAR SPINE - 2-3 VIEW  Technique:  C-arm fluoroscopic images were obtained intraoperatively and submitted for postoperative interpretation. Please see the performing provider's procedural report for the fluoroscopy time utilized.  Comparison:  Outside MRI 02/17/2013  Findings: Two intraoperative images demonstrate bilateral pedicle screw and rod fixation from L3-L5.  There are laminectomy defects at these levels.  Interbody devices at L3-L4 and L4-L5.  IMPRESSION: Spinal fusion from L3-L5.   Original Report Authenticated By: Richarda Overlie, M.D.   Dg Chest Port 1 View  08/12/2013   *RADIOLOGY REPORT*  Clinical Data: Status post central line placement  PORTABLE CHEST - 1 VIEW  Comparison: 08/05/2013  Findings: The overall inspiratory effort is poor although no focal infiltrate is seen.  The  cardiac shadow is within normal limits.  A left-sided central venous line is seen with its tip projected over the left innominate vein/SVC junction.  No pneumothorax is noted. No bony abnormality is seen.  IMPRESSION: Status post central line placement without pneumothorax.  Catheter tip is noted overlying the expected area of the proximal SVC.  These results were called by telephone on 08/12/2013 at 1204 hours to [facility], the patient's nurse, who verbally acknowledged these results.   Original Report Authenticated By: Alcide Clever, M.D.   Dg C-arm [phone] Min  08/12/2013   *RADIOLOGY REPORT*  Clinical Data: Spinal fusion.  DG C-ARM 61-120 MIN,LUMBAR SPINE - 2-3 VIEW  Technique:  C-arm fluoroscopic images were obtained intraoperatively and submitted for postoperative interpretation. Please see the performing provider's procedural report for the fluoroscopy time utilized.  Comparison:  Outside MRI 02/17/2013  Findings: Two intraoperative images demonstrate bilateral pedicle screw and rod fixation from L3-L5.  There are laminectomy defects at these levels.  Interbody devices at L3-L4 and L4-L5.  IMPRESSION: Spinal fusion from L3-L5.   Original Report Authenticated By: Richarda Overlie, M.D.    Antibiotics:  Anti-infectives   Start     Dose/Rate Route Frequency Ordered Stop   08/12/13 2000  vancomycin (VANCOCIN) IVPB 1000 mg/200 mL premix     1,000 mg 200 mL/hr over 60 Minutes Intravenous Every 12 hours 08/12/13 1352 08/12/13 2101   08/12/13 1330  ceFAZolin (ANCEF) IVPB 1 g/50 mL premix  Status:  Discontinued     1 g 100 mL/hr over 30 Minutes Intravenous Every 8 hours 08/12/13 1325 08/12/13 1351   08/12/13 0828  bacitracin 50,000 Units in sodium chloride irrigation 0.9 % 500 mL irrigation  Status:  Discontinued       As needed 08/12/13 0828 08/12/13 1120   08/12/13 0729  vancomycin (VANCOCIN) 1 GM/200ML IVPB    Comments:  Glo Herring   : cabinet override      08/12/13 0729 08/12/13 0744   08/12/13 0600   ceFAZolin (ANCEF) IVPB 2 g/50 mL premix  Status:  Discontinued     2 g 100 mL/hr over 30 Minutes Intravenous On call to O.R. 08/11/13 1357 08/12/13 1325      Discharge Exam: Blood pressure 173/74, pulse 52, temperature 97.9 F (36.6 C), temperature source Oral, resp. rate 20, height 5\' 1"  (1.549 m), weight 82.555 kg (182  lb), SpO2 98.00%. Neurologic: Grossly normal Incision CDI  Discharge Medications:     Medication List         acetaminophen 500 MG tablet  Commonly known as:  TYLENOL  Take 500 mg by mouth every 6 (six) hours as needed for pain.     cetirizine 10 MG tablet  Commonly known as:  ZYRTEC  Take 10 mg by mouth daily as needed for allergies.     CITRACAL PO  Take 1 packet by mouth daily. Mixes with 8 ounces of liquid     fenofibrate 48 MG tablet  Commonly known as:  TRICOR  Take 48 mg by mouth daily.     fluticasone 50 MCG/ACT nasal spray  Commonly known as:  FLONASE  Place 2 sprays into the nose daily as needed. Allergic rhinitis.     GNP TRIPLE OMEGA COMPLEX Cpdr  Take 1 tablet by mouth daily.     HYDROcodone-acetaminophen 5-325 MG per tablet  Commonly known as:  NORCO/VICODIN  Take 1-2 tablets by mouth every 4 (four) hours as needed.     methocarbamol 500 MG tablet  Commonly known as:  ROBAXIN  Take 1 tablet (500 mg total) by mouth every 6 (six) hours as needed.     multivitamin with minerals Tabs tablet  Take 1 tablet by mouth daily.     naproxen sodium 220 MG tablet  Commonly known as:  ANAPROX  Take 440 mg by mouth 2 (two) times daily with a meal.     OSTEO BI-FLEX REGULAR STRENGTH PO  Take 1 tablet by mouth 2 (two) times daily.     simvastatin 40 MG tablet  Commonly known as:  ZOCOR  Take 40 mg by mouth daily.     tretinoin 0.05 % cream  Commonly known as:  RETIN-A  Apply 1 application topically 2 (two) times daily.     vitamin A palmitate 10000 UNIT capsule  Take 10,000 Units by mouth daily.     vitamin B-12 1000 MCG tablet   Commonly known as:  CYANOCOBALAMIN  Take 1,000 mcg by mouth daily.     Vitamin D 2000 UNITS tablet  Take 2,000 Units by mouth daily.        Disposition: home with HHPT   Final Dx: PLIF L3-4, L4-5      Discharge Orders   Future Orders Complete By Expires   Care order/instruction  As directed    Scheduling Instructions:     Discharge home today   Comments:     Home today   Diet - low sodium heart healthy  As directed    Face-to-face encounter (required for Medicare/Medicaid patients)  As directed    Comments:     I Leith Szafranski S certify that this patient is under my care and that I, or a nurse practitioner or physician's assistant working with me, had a face-to-face encounter that meets the physician face-to-face encounter requirements with this patient on 08/18/2013. The encounter with the patient was in whole, or in part for the following medical condition(s) which is the primary reason for home health care (List medical condition): pain/ decreased function after back surgery   Questions:     The encounter with the patient was in whole, or in part, for the following medical condition, which is the primary reason for home health care:  back surgery   I certify that, based on my findings, the following services are medically necessary home health services:  Nursing   Physical therapy  My clinical findings support the need for the above services:  Pain interferes with ambulation/mobility   Further, I certify that my clinical findings support that this patient is homebound due to:  Pain interferes with ambulation/mobility   Reason for Medically Necessary Home Health Services:  Therapy- Therapeutic Exercises to Increase Strength and Endurance   Home Health  As directed    Questions:     To provide the following care/treatments:  PT   OT   Home Health Aide   Increase activity slowly  As directed       Follow-up Information   Follow up with Alok Minshall S, MD. Schedule an  appointment as soon as possible for a visit in 2 weeks.   Specialty:  Neurosurgery   Contact information:   [address] [place] [address] [phone]        Signed: Tia Alert 08/18/2013, 7:55 AM

## 2013-08-19 ENCOUNTER — Encounter: Payer: Self-pay | Admitting: Adult Health

## 2013-08-23 ENCOUNTER — Other Ambulatory Visit: Payer: Self-pay | Admitting: *Deleted

## 2013-08-23 MED ORDER — HYDROCODONE-ACETAMINOPHEN 5-325 MG PO TABS: ORAL_TABLET | ORAL | Status: DC

## 2013-08-25 ENCOUNTER — Non-Acute Institutional Stay (SKILLED_NURSING_FACILITY): Payer: Medicare Other | Admitting: Internal Medicine

## 2013-08-25 DIAGNOSIS — E7800 Pure hypercholesterolemia, unspecified: Secondary | ICD-10-CM

## 2013-08-25 DIAGNOSIS — M48061 Spinal stenosis, lumbar region without neurogenic claudication: Secondary | ICD-10-CM

## 2013-08-25 DIAGNOSIS — Z79899 Other long term (current) drug therapy: Secondary | ICD-10-CM

## 2013-08-25 DIAGNOSIS — J309 Allergic rhinitis, unspecified: Secondary | ICD-10-CM

## 2013-08-26 ENCOUNTER — Encounter: Payer: Self-pay | Admitting: Adult Health

## 2013-08-28 DIAGNOSIS — Z4789 Encounter for other orthopedic aftercare: Secondary | ICD-10-CM

## 2013-08-28 DIAGNOSIS — M4120 Other idiopathic scoliosis, site unspecified: Secondary | ICD-10-CM

## 2013-08-28 DIAGNOSIS — M4800 Spinal stenosis, site unspecified: Secondary | ICD-10-CM

## 2013-08-28 DIAGNOSIS — R269 Unspecified abnormalities of gait and mobility: Secondary | ICD-10-CM

## 2013-09-17 ENCOUNTER — Other Ambulatory Visit: Payer: Self-pay | Admitting: Cardiology

## 2013-09-17 DIAGNOSIS — Z79899 Other long term (current) drug therapy: Secondary | ICD-10-CM

## 2013-09-17 DIAGNOSIS — E782 Mixed hyperlipidemia: Secondary | ICD-10-CM

## 2013-09-20 DIAGNOSIS — E7800 Pure hypercholesterolemia, unspecified: Secondary | ICD-10-CM | POA: Insufficient documentation

## 2013-09-20 DIAGNOSIS — M48061 Spinal stenosis, lumbar region without neurogenic claudication: Secondary | ICD-10-CM | POA: Insufficient documentation

## 2013-09-20 DIAGNOSIS — Z79899 Other long term (current) drug therapy: Secondary | ICD-10-CM | POA: Insufficient documentation

## 2013-09-20 DIAGNOSIS — J309 Allergic rhinitis, unspecified: Secondary | ICD-10-CM | POA: Insufficient documentation

## 2013-09-20 NOTE — Progress Notes (Signed)
Patient ID: Lisa [name], female   DOB: [birth year], 72 y.o.   MRN: [id number]        HISTORY & PHYSICAL  DATE: 08/25/2013   FACILITY: [facility]  LEVEL OF CARE: SNF (31)  ALLERGIES:  Allergies  Allergen Reactions  . Other     Anesthesia causes nausea and vomiting, memory difficulty and problems processing.  . Oxycodone Nausea Only  . Sulfa Antibiotics Rash    CHIEF COMPLAINT:  Manage lumbar stenosis, hyperlipidemia, and allergic rhinitis.    HISTORY OF PRESENT ILLNESS:  The patient is a 72 year-old, Caucasian female.    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  She underwent surgical decompression, arthrodesis at L3-4, L4-5, and tolerated the procedure well.  Patient's spinal stenosis remains stable. Patient denies ongoing low back pain, numbness, tingling or weakness. No complications reported from the medications currently being used.    HYPERLIPIDEMIA: No complications from the medications presently being used. Last fasting lipid panel showed :  Not available.    ALLERGIC RHINITIS: Allergic rhinitis remains stable. Patient denies ongoing symptoms such as runny nose, sneezing or tearing. No complications reported from the current medication(s) being used.    PAST MEDICAL HISTORY :  Past Medical History  Diagnosis Date  . Hyperlipemia   . Hypertension     [facility] cardiac pharmacist follows pt. Riki Rusk Smart ,has never taken anti-hypertensive  . Pneumonia     double pneumonia - relative to sinus infection , had chemical cauterization   . Sleep apnea     Bringing cpap mask and tubing, study last done- 2006  . Chronic kidney disease     kidney stones, Nov. 2013- tx with Lithotripsy, still has some present   . Headache(784.0)     since stopping aleve- 07/30/2013  . Degenerative disc disease     knees, ankles, back- OA  . PONV (postoperative nausea and vomiting)     urgency, N&V  for days, memory & processing    PAST SURGICAL HISTORY: Past Surgical History  Procedure Laterality Date  . Bladder repair    . Tonsillectomy    . Bladder suspension  90's  . Abdominal hysterectomy  1990's  . Multiple tooth extractions      wisdom teeth extractions   . Posterior fusion lumbar spine  08/12/2013    Dr Yetta Barre  . Maximum access (mas)posterior lumbar interbody fusion (plif) 2 level N/A 08/12/2013    Procedure: FOR MAXIMUM ACCESS (MAS) POSTERIOR LUMBAR INTERBODY FUSION (PLIF) 2 LEVEL;  Surgeon: Tia Alert, MD;  Location: [facility];  Service: Neurosurgery;  Laterality: N/A;  FOR MAXIMUM ACCESS (MAS) POSTERIOR LUMBAR INTERBODY FUSION (PLIF) 2 LEVEL    SOCIAL HISTORY:  reports that she has never smoked. She has never used smokeless tobacco. She reports that  drinks alcohol. She reports that she does not use illicit drugs.  FAMILY HISTORY: None  CURRENT MEDICATIONS: Reviewed per MAR  REVIEW OF SYSTEMS:  See HPI otherwise 14 point ROS is negative.  PHYSICAL EXAMINATION  VS:  T 97.2       P 60      RR 20      BP 146/72      POX%        WT (Lb) 178.2  GENERAL: no acute distress, moderately obese body habitus EYES: conjunctivae normal, sclerae normal, normal eye lids MOUTH/THROAT: lips without lesions,no lesions in the mouth,tongue is without  lesions,uvula elevates in midline NECK: supple, trachea midline, no neck masses, no thyroid tenderness, no thyromegaly LYMPHATICS: no LAN in the neck, no supraclavicular LAN RESPIRATORY: breathing is even & unlabored, BS CTAB CARDIAC: RRR, no murmur,no extra heart sounds, no edema GI:  ABDOMEN: unable to assess, patient has a brace    LIVER/SPLEEN: no hepatomegaly, no splenomegaly MUSCULOSKELETAL: HEAD: normal to inspection & palpation BACK: no kyphosis, scoliosis or spinal processes tenderness EXTREMITIES: LEFT UPPER EXTREMITY: full range of motion, normal strength & tone RIGHT UPPER EXTREMITY:  full range of motion, normal  strength & tone LEFT LOWER EXTREMITY: strength intact, range of motion moderate    RIGHT LOWER EXTREMITY: strength intact, range of motion moderate   PSYCHIATRIC: the patient is alert & oriented to person, affect & behavior appropriate  LABS/RADIOLOGY:  None received from hospitalization.     ASSESSMENT/PLAN:  Lumbar stenosis.  Status post decompression.  Continue rehabilitation.    Hyperlipidemia.  Continue current medications.    Allergic rhinitis.  Well controlled.    Check CBC and BMP.    I have reviewed patient's medical records received at admission/from hospitalization.  CPT CODE: 16109

## 2013-10-26 ENCOUNTER — Other Ambulatory Visit (INDEPENDENT_AMBULATORY_CARE_PROVIDER_SITE_OTHER): Payer: Medicare Other

## 2013-10-26 DIAGNOSIS — E782 Mixed hyperlipidemia: Secondary | ICD-10-CM

## 2013-10-26 DIAGNOSIS — Z79899 Other long term (current) drug therapy: Secondary | ICD-10-CM

## 2013-10-26 LAB — LIPID PANEL
Cholesterol: 217 mg/dL — ABNORMAL HIGH (ref 0–200)
Total CHOL/HDL Ratio: 4
VLDL: 41.8 mg/dL — ABNORMAL HIGH (ref 0.0–40.0)

## 2013-10-26 LAB — LDL CHOLESTEROL, DIRECT: Direct LDL: 133.9 mg/dL

## 2013-10-27 ENCOUNTER — Other Ambulatory Visit: Payer: Self-pay

## 2013-10-27 ENCOUNTER — Ambulatory Visit (INDEPENDENT_AMBULATORY_CARE_PROVIDER_SITE_OTHER): Payer: Medicare Other | Admitting: Pharmacist

## 2013-10-27 VITALS — Wt 175.8 lb

## 2013-10-27 DIAGNOSIS — Z79899 Other long term (current) drug therapy: Secondary | ICD-10-CM

## 2013-10-27 DIAGNOSIS — E7800 Pure hypercholesterolemia, unspecified: Secondary | ICD-10-CM

## 2013-10-27 MED ORDER — FENOFIBRATE 48 MG PO TABS: ORAL_TABLET | ORAL | Status: DC

## 2013-10-27 MED ORDER — GNP TRIPLE OMEGA COMPLEX PO CPDR: 2.0000 | DELAYED_RELEASE_CAPSULE | Freq: Every day | ORAL | Status: DC

## 2013-10-27 NOTE — Progress Notes (Signed)
Patient here for follow-up for in lipid clinic due to h/o elevated LDL and TG.  Patient recently had back surgery 07/2013 for spinal stenosis in L 3-4 and L 4-5, and then spent time in nursing home 08/2013 for rehab.  Since the surgery she is much more active, off hydrocodone and muscle relaxers, and happier in general.  She is also no longer taking NSAIDs.  She has been losing weight over past few months as well, however she admits to gaining a few pounds back lately as her appetite is much better now.  She is now walking 2-3 miles per day, where before surgery she wasn't able to exercise at all  She is still the primary care giver for her 49 y.o. Mother, and her food options commonly revolve on what her mother will eat.  Tolerating simvastatin 40 mg and fenofibrate 48 mg well.  She is taking fenofibrate in the morning prior to breakfast currently.  She stopped her tylenol, fish oil, and glucosamine at time of surgery, and has noticed her arthritis getting worse.  She would like to get back on these medications, and wants to know if it is okay.   Cardiac risk factors family h/o CAD, HTN, age.  Target LDL < 130 at least (prefer < 100) Target non-HDL < 160 at least (prefer < 130)  Medications currently taking: simvastatin 40 mg, Tricor 48 mg qd Intolerance Tricor 145 mg (nauseating headaches).  Social history:  No tobacco use.  Alcohol use is rare (social only). Family history:  Both mother and father had CAD - multiple MIs.  Brother with h/o CAD/PCI.  Diet Lunch and Dinner - eats out most days and nights but tries to get vegetables or chicken/fish. Patient taking care of her mother, who will only eat dinner if they go out. Since her surgery 2 months ago she has been eating more red meat- multiple times per week. Sodas previously at 2 per day, then quit, now ~ 1 per day.   Saw a nutritionist in the past and lost 40 pounds. She weighed 150 lbs 3 years ago when she was walking 6 miles per day. Weight is 176  today, and 183 in 01/2013.  She   Exercise:  Now walking 2-3 miles per day since her back surgery 2 months ago.  She wasn't able to exercise at all prior to surgery.  This is likely the reason for her weight loss, as her appetite and food intake seems to have increased.  Labs:   10/2013  TC 217, TG 209, HDL 53, LDL 134, ALT normal (on simvastatin 40 mg and fenofibrate 48 mg qd - without food).  Eating more in general, and eating lots of red meat.  Walking 2-3 miles per day. 01/2013  TC 177, TG 171, HDL 52, LDL 99, LFTs WNL (on simvastatin 40 mg, fenofibrate 48 mg qd, fish oil 1 g/d).  Was eating less due to back pain.  Not exercising. ~ 2006- h/o TG 1400 mg/dL  Current Outpatient Prescriptions  Medication Sig Dispense Refill  . acetaminophen (TYLENOL) 500 MG tablet Take 500 mg by mouth every 6 (six) hours as needed for pain.      . Calcium Citrate (CITRACAL PO) Take 1 packet by mouth daily. Mixes with 8 ounces of liquid      . cetirizine (ZYRTEC) 10 MG tablet Take 10 mg by mouth daily as needed for allergies.       . Cholecalciferol (VITAMIN D) 2000 UNITS tablet Take 2,000  Units by mouth daily.      . fenofibrate (TRICOR) 48 MG tablet Take 48 mg by mouth daily.      . fluticasone (FLONASE) 50 MCG/ACT nasal spray Place 2 sprays into the nose daily as needed. Allergic rhinitis.      . Glucosamine-Chondroitin (OSTEO BI-FLEX REGULAR STRENGTH PO) Take 1 tablet by mouth 2 (two) times daily.      Marland Kitchen HYDROcodone-acetaminophen (NORCO/VICODIN) 5-325 MG per tablet Take one tablet by mouth every four hours. Take one tablet by mouth every 4 hours as needed for pain  360 tablet  0  . methocarbamol (ROBAXIN) 500 MG tablet Take 1 tablet (500 mg total) by mouth every 6 (six) hours as needed.  60 tablet  3  . Multiple Vitamin (MULTIVITAMIN WITH MINERALS) TABS Take 1 tablet by mouth daily.      . naproxen sodium (ANAPROX) 220 MG tablet Take 440 mg by mouth 2 (two) times daily with a meal.      . Omega 3-6-9 Fatty  Acids (GNP TRIPLE OMEGA COMPLEX) CPDR Take 1 tablet by mouth daily.      . simvastatin (ZOCOR) 40 MG tablet Take 40 mg by mouth daily.      Marland Kitchen tretinoin (RETIN-A) 0.05 % cream Apply 1 application topically 2 (two) times daily.      . vitamin A palmitate 10000 UNIT capsule Take 10,000 Units by mouth daily.      . vitamin B-12 (CYANOCOBALAMIN) 1000 MCG tablet Take 1,000 mcg by mouth daily.       No current facility-administered medications for this visit.   Allergies  Allergen Reactions  . Other     Anesthesia causes nausea and vomiting, memory difficulty and problems processing.  . Oxycodone Nausea Only  . Sulfa Antibiotics Rash

## 2013-10-27 NOTE — Patient Instructions (Addendum)
1.  Continue simvastatin 40 mg daily, and fenofibrate 48 mg daily.  Make sure you take your fenofibrate with your largest meal of the day. 2.  Get back on glucosamine w/ chondroitin (1500 mg daily).  Liquid [employer] if trouble swallowing. 3.  Use tylenol as needed for arthritis pain. 4.  Restart fish oil 2 capsules daily. 5.  Continue to walk 2-3 miles per day. 6.  Reduce red meat to no more than once weekly if possible.  7.  Recheck lipid / liver in 6 months (05/04/13), then see me the next day (05/04/14 at 9 am).

## 2013-10-27 NOTE — Assessment & Plan Note (Addendum)
Patient's cholesterol has trended up over past 8 months despite weight loss and increase in exercise.  She is now eating more since she is no longer in pain since her surgery, but she is losing weight likely to now walking 2-3 miles per day.  She is back to drinking soda and eating a lot more red meat.  Her arthritis is likely worse due to stopping NSAIDs/ tylenol / fish oil / glucosamine following surgery.  Will hopefully be able to maintain this level of exercise and continue to increase if he arthritis pain can be controlled.  Patient taking fenofibrate on empty stomach is also limiting the medications ability to lower TG and LDL further.  Given patient's LDL and non-HDL was well controlled earlier this year on same regimen, will not make any major changes, but will have her work harder on diet.  Diet has been difficult for her given her 72 y.o. Mother will only eat certain foods, and patient is the sole health care provider.  Plan: 1.  Continue simvastatin 40 mg daily, and fenofibrate 48 mg daily. Change time to take fenofibrate with your largest meal of the day to increase bioavailability. 2.  Get back on glucosamine w/ chondroitin (1500 mg daily).  Liquid [employer] if trouble swallowing. 3.  Use tylenol as needed for arthritis pain. 4.  Restart fish oil 2 capsules daily. 5.  Continue to walk 2-3 miles per day. 6.  Reduce red meat to no more than once weekly if possible.  7.  Recheck lipid / liver in 6 months (05/04/13), then see me the next day (05/04/14 at 9 am) Lab and note also faxed to Dr. Earl Gala

## 2013-11-29 DIAGNOSIS — M549 Dorsalgia, unspecified: Secondary | ICD-10-CM | POA: Insufficient documentation

## 2014-02-17 NOTE — Progress Notes (Signed)
This encounter was created in error - please disregard.

## 2014-05-03 ENCOUNTER — Other Ambulatory Visit (INDEPENDENT_AMBULATORY_CARE_PROVIDER_SITE_OTHER): Payer: Medicare Other

## 2014-05-03 DIAGNOSIS — Z79899 Other long term (current) drug therapy: Secondary | ICD-10-CM

## 2014-05-03 DIAGNOSIS — E7800 Pure hypercholesterolemia, unspecified: Secondary | ICD-10-CM

## 2014-05-03 LAB — HEPATIC FUNCTION PANEL
ALT: 21 U/L (ref 0–35)
AST: 20 U/L (ref 0–37)
Albumin: 3.9 g/dL (ref 3.5–5.2)
Alkaline Phosphatase: 42 U/L (ref 39–117)
BILIRUBIN DIRECT: 0 mg/dL (ref 0.0–0.3)
BILIRUBIN TOTAL: 0.5 mg/dL (ref 0.2–1.2)
TOTAL PROTEIN: 6.9 g/dL (ref 6.0–8.3)

## 2014-05-03 LAB — LIPID PANEL
Cholesterol: 192 mg/dL (ref 0–200)
HDL: 56.4 mg/dL (ref >39.00)
LDL CALC: 104 mg/dL — AB (ref 0–99)
Total CHOL/HDL Ratio: 3
Triglycerides: 160 mg/dL — ABNORMAL HIGH (ref 0.0–149.0)
VLDL: 32 mg/dL (ref 0.0–40.0)

## 2014-05-04 ENCOUNTER — Ambulatory Visit: Payer: Medicare Other | Admitting: Pharmacist

## 2014-05-09 ENCOUNTER — Ambulatory Visit (INDEPENDENT_AMBULATORY_CARE_PROVIDER_SITE_OTHER): Payer: Medicare Other | Admitting: Pharmacist

## 2014-05-09 VITALS — Wt 180.0 lb

## 2014-05-09 DIAGNOSIS — Z79899 Other long term (current) drug therapy: Secondary | ICD-10-CM

## 2014-05-09 DIAGNOSIS — E7800 Pure hypercholesterolemia, unspecified: Secondary | ICD-10-CM

## 2014-05-09 NOTE — Patient Instructions (Signed)
1.  Continue simvastatin and fenofibrate (Tricor) daily. 2. Restart fish oil 2 per day, and glucosamine in hopes of helping with arthritis.  Can also help lower triglycerides further. 3.  Reduce portion size of meals, especially when eating out with mom. 4.  Recheck NMR LipoProfile and hepatic panel in 6 months (10/31/14 - fasting labs, show up anytime after 7:30 am), and see me 2 days later (11/02/14 - 9 am)

## 2014-05-09 NOTE — Progress Notes (Signed)
Lisa [name] here for follow-up for in lipid clinic due to h/o elevated LDL and TG.  Lisa [name] has been stable on simvastatin 40 mg qd and fenofibrate 48 mg qd, but recently started taking fenofibrate with food as she was instructed to do.  She was off fish oil for past few months, but restarted it a few days ago.  Lisa [name] had back surgery 07/2013 for spinal stenosis in L 3-4 and L 4-5, and then spent time in nursing home 08/2013 for rehab.  Since the surgery she is much more active, off hydrocodone and muscle relaxers, and happier in general.  She is also no longer taking NSAIDs.  She has been losing weight over past few months as well, however she admits to gaining a few pounds back lately as her appetite is much better now.  She was  walking 2-3 miles per day, however hasn't been doing this lately as she is caring for her 72 y.o. Mother who recently was discharged from hospital. Her mother has lived with Lisa [name] for past 15 years, and she is her primary care provider.  Her food options commonly revolve on what her mother will eat.  Tolerating simvastatin 40 mg and fenofibrate 48 mg well.  She has been off glucosamine for past few months as she ran out, but plans on getting back on this as her arthritis has been getting worse.   Cardiac risk factors family h/o CAD, HTN, age.  Target LDL < 130 at least (prefer < 100) Target non-HDL < 160 at least (prefer < 130)  Medications currently taking: simvastatin 40 mg, Tricor 48 mg qd, fish oil 2 g/d Intolerance Tricor 145 mg (nauseating headaches).  Social history:  No tobacco use.  Alcohol use is rare (social only). Family history:  Both mother and father had CAD - multiple MIs.  Brother with h/o CAD/PCI.  Diet Lunch and Dinner - eats out most days and nights but tries to get vegetables or chicken/fish. Lisa [name] taking care of her mother, who will only eat dinner if they go out. Since her surgery 2 months ago she has been eating more red meat- multiple times per week.  Sodas previously at 2 per day, then quit, now ~ 1 per day.   Saw a nutritionist in the past and lost 40 pounds. She weighed 150 lbs 3 years ago when she was walking 6 miles per day. Weight is 180  today, and was 176 six months ago. Exercise:  Stopped doing this regularly due to her mother's healthcare needs.  This is likely reason for gaining 4-5 lbs over past few months.  Labs: 04/2014:  TC 192, TG 160, HDL 56, LDL 104, LFTs normal (Simvastatin 40 mg qd, fenofibrate 48 mg with food, fish oil 2 g/d).  Not walking as much.   10/2013  TC 217, TG 209, HDL 53, LDL 134, ALT normal (on simvastatin 40 mg and fenofibrate 48 mg qd - without food).  Eating more in general, and eating lots of red meat.  Walking 2-3 miles per day. 01/2013  TC 177, TG 171, HDL 52, LDL 99, LFTs WNL (on simvastatin 40 mg, fenofibrate 48 mg qd, fish oil 1 g/d).  Was eating less due to back pain.  Not exercising. ~ 2006- h/o TG 1400 mg/dL  Current Outpatient Prescriptions  Medication Sig Dispense Refill  . acetaminophen (TYLENOL) 500 MG tablet Take 500 mg by mouth every 6 (six) hours as needed for pain.      . Calcium Citrate (CITRACAL  PO) Take 1 packet by mouth daily. Mixes with 8 ounces of liquid      . cetirizine (ZYRTEC) 10 MG tablet Take 10 mg by mouth daily as needed for allergies.       . Cholecalciferol (VITAMIN D) 2000 UNITS tablet Take 2,000 Units by mouth daily.      . fenofibrate (TRICOR) 48 MG tablet Take 1 tablet daily with largest meal of the day      . fluticasone (FLONASE) 50 MCG/ACT nasal spray Place 2 sprays into the nose daily as needed. Allergic rhinitis.      . Glucosamine-Chondroitin (OSTEO BI-FLEX REGULAR STRENGTH PO) Take 1 tablet by mouth 2 (two) times daily.      . Multiple Vitamin (MULTIVITAMIN WITH MINERALS) TABS Take 1 tablet by mouth daily.      Ailene Ards. Omega 3-6-9 Fatty Acids (GNP TRIPLE OMEGA COMPLEX) CPDR Take 2 tablets by mouth daily.      . simvastatin (ZOCOR) 40 MG tablet Take 40 mg by mouth daily.       Marland Kitchen. tretinoin (RETIN-A) 0.05 % cream Apply 1 application topically 2 (two) times daily.      . vitamin A palmitate 10000 UNIT capsule Take 10,000 Units by mouth daily.      . vitamin B-12 (CYANOCOBALAMIN) 1000 MCG tablet Take 1,000 mcg by mouth daily.       No current facility-administered medications for this visit.   Allergies  Allergen Reactions  . Other     Anesthesia causes nausea and vomiting, memory difficulty and problems processing.  . Oxycodone Nausea Only  . Sulfa Antibiotics Rash

## 2014-05-09 NOTE — Assessment & Plan Note (Signed)
Excellent improvement in lipid panel, and LDL is now well below 130 mg/dL and closer to 161100 mg/dL.  Patient taking fenofibrate with food and increasing bioavailability likely helped further lower LDL.  Will continue same regimen, and have her try and improve her dietary habits.  She will try to increase walking again when her mom gets better.  Recheck lipid/liver in 6 months.  Patient wants to continue to see me after labs to keep her accountable.

## 2014-06-15 ENCOUNTER — Encounter: Payer: Self-pay | Admitting: *Deleted

## 2014-10-06 ENCOUNTER — Other Ambulatory Visit: Payer: Self-pay

## 2014-10-31 ENCOUNTER — Other Ambulatory Visit (INDEPENDENT_AMBULATORY_CARE_PROVIDER_SITE_OTHER): Payer: Medicare Other

## 2014-10-31 DIAGNOSIS — Z79899 Other long term (current) drug therapy: Secondary | ICD-10-CM

## 2014-10-31 DIAGNOSIS — E7800 Pure hypercholesterolemia, unspecified: Secondary | ICD-10-CM

## 2014-10-31 LAB — LIPID PANEL
CHOL/HDL RATIO: 4
Cholesterol: 196 mg/dL (ref 0–200)
HDL: 45.2 mg/dL (ref >39.00)
NONHDL: 150.8
TRIGLYCERIDES: 232 mg/dL — AB (ref 0.0–149.0)
VLDL: 46.4 mg/dL — ABNORMAL HIGH (ref 0.0–40.0)

## 2014-10-31 LAB — LDL CHOLESTEROL, DIRECT: Direct LDL: 112.5 mg/dL

## 2014-10-31 LAB — HEPATIC FUNCTION PANEL
ALK PHOS: 44 U/L (ref 39–117)
ALT: 19 U/L (ref 0–35)
AST: 20 U/L (ref 0–37)
Albumin: 3.4 g/dL — ABNORMAL LOW (ref 3.5–5.2)
BILIRUBIN DIRECT: 0.1 mg/dL (ref 0.0–0.3)
Total Bilirubin: 0.5 mg/dL (ref 0.2–1.2)
Total Protein: 7.2 g/dL (ref 6.0–8.3)

## 2014-11-02 ENCOUNTER — Encounter: Payer: Self-pay | Admitting: Pharmacist Clinician (PhC)/ Clinical Pharmacy Specialist

## 2014-11-02 ENCOUNTER — Ambulatory Visit (INDEPENDENT_AMBULATORY_CARE_PROVIDER_SITE_OTHER): Payer: Medicare Other | Admitting: Pharmacist Clinician (PhC)/ Clinical Pharmacy Specialist

## 2014-11-02 VITALS — Ht 61.0 in | Wt 179.5 lb

## 2014-11-02 DIAGNOSIS — E7800 Pure hypercholesterolemia, unspecified: Secondary | ICD-10-CM

## 2014-11-02 NOTE — Progress Notes (Signed)
Patient here for follow-up for in lipid clinic due to h/o elevated LDL and TG.  Patient has been stable on simvastatin 40 mg qd and fenofibrate 48 mg qd and fish oil 2gm/day.  She has not been able to exercise as much as she would like, since having back surgery in August 2014 for spinal stenosis in L 3-4 and L 4-5.  She is walking some, but previous to back surgery was walking 4-6 miles per day, now doing about 1/2-1 mile.    She has not been eating as well as she would like, admitting to more hamburgers and beef in general recently.   Her 72 year old mother lives with her and is becoming more dependent on her daughter.  She is unable to leave home for long periods now.  Her food options commonly revolve on what her mother will eat.  Tolerating simvastatin 40 mg and fenofibrate 48 mg well.    Cardiac risk factors family h/o CAD, HTN, age.  Target LDL < 130 at least (prefer < 100) Target non-HDL < 160 at least (prefer < 130)  Medications currently taking: simvastatin 40 mg, Tricor 48 mg qd, fish oil 2 g/d Intolerance Tricor 145 mg (nauseating headaches).  Social history:  No tobacco use.  Alcohol use is rare (social only). Family history:  Both mother and father had CAD - multiple MIs.  Brother with h/o CAD/PCI.  Diet Lunch and Dinner - eats out most days and nights but tries to get vegetables or chicken/fish. Patient taking care of her mother, who will only eat dinner if they go out. Since her surgery 2 months ago she has been eating more red meat- multiple times per week. Sodas previously at 2 per day, then quit, now ~ 1 per day.   Saw a nutritionist in the past and lost 40 pounds. She weighed 150 lbs 3 years ago when she was walking 6 miles per day. Weight is 180  today, and was 176 six months ago. Exercise:  Stopped doing this regularly due to back problems and her mother's healthcare needs.    Labs: 10/2014:  TC 196, TG 232, HDL 45.2, LDL 112.5, nonHDL 150.8 (Simvastatin 40 mg qd, fenofibrate 48  mg with food, fish oil 2 g/d) 04/2014:  TC 192, TG 160, HDL 56, LDL 104, LFTs normal (Simvastatin 40 mg qd, fenofibrate 48 mg with food, fish oil 2 g/d).  Not walking as much.  10/2013  TC 217, TG 209, HDL 53, LDL 134, ALT normal (on simvastatin 40 mg and fenofibrate 48 mg qd - without food).  Eating more in general, and eating lots of red meat.  Walking 2-3 miles per day. 01/2013  TC 177, TG 171, HDL 52, LDL 99, LFTs WNL (on simvastatin 40 mg, fenofibrate 48 mg qd, fish oil 1 g/d).  Was eating less due to back pain.  Not exercising. ~ 2006- h/o TG 1400 mg/dL  Current Outpatient Prescriptions  Medication Sig Dispense Refill  . acetaminophen (TYLENOL) 500 MG tablet Take 500 mg by mouth every 6 (six) hours as needed for pain.    . Cholecalciferol (VITAMIN D) 2000 UNITS tablet Take 2,000 Units by mouth daily.    . fenofibrate (TRICOR) 48 MG tablet Take 1 tablet daily with largest meal of the day    . fluticasone (FLONASE) 50 MCG/ACT nasal spray Place 2 sprays into the nose daily as needed. Allergic rhinitis.    . Glucosamine-Chondroitin (OSTEO BI-FLEX REGULAR STRENGTH PO) Take 1 tablet by  mouth 2 (two) times daily.    . Multiple Vitamin (MULTIVITAMIN WITH MINERALS) TABS Take 1 tablet by mouth daily.    Ailene Ards. Omega 3-6-9 Fatty Acids (GNP TRIPLE OMEGA COMPLEX) CPDR Take 2 tablets by mouth daily.    . simvastatin (ZOCOR) 40 MG tablet Take 40 mg by mouth daily.    Marland Kitchen. tretinoin (RETIN-A) 0.05 % cream Apply 1 application topically 2 (two) times daily.    . vitamin A palmitate 10000 UNIT capsule Take 10,000 Units by mouth daily.    . vitamin B-12 (CYANOCOBALAMIN) 1000 MCG tablet Take 1,000 mcg by mouth daily.     No current facility-administered medications for this visit.   Allergies  Allergen Reactions  . Other     Anesthesia causes nausea and vomiting, memory difficulty and problems processing.  . Oxycodone Nausea Only  . Sulfa Antibiotics Rash

## 2014-11-02 NOTE — Patient Instructions (Signed)
Continue with current medication regimens.  Work on dietary issues - eat more lean meats (fish, chicken) and less beef or pork.  Eat plenty of fiber and fresh fruits and vegetables.  Return for labs in 6 months and office visit later that week.

## 2015-05-07 ENCOUNTER — Other Ambulatory Visit (INDEPENDENT_AMBULATORY_CARE_PROVIDER_SITE_OTHER): Payer: Medicare Other | Admitting: *Deleted

## 2015-05-07 DIAGNOSIS — E7800 Pure hypercholesterolemia, unspecified: Secondary | ICD-10-CM

## 2015-05-07 LAB — LIPID PANEL
Cholesterol: 191 mg/dL (ref 0–200)
HDL: 54.1 mg/dL (ref >39.00)
NonHDL: 136.9
TRIGLYCERIDES: 243 mg/dL — AB (ref 0.0–149.0)
Total CHOL/HDL Ratio: 4
VLDL: 48.6 mg/dL — ABNORMAL HIGH (ref 0.0–40.0)

## 2015-05-07 LAB — HEPATIC FUNCTION PANEL
ALBUMIN: 4 g/dL (ref 3.5–5.2)
ALT: 21 U/L (ref 0–35)
AST: 17 U/L (ref 0–37)
Alkaline Phosphatase: 44 U/L (ref 39–117)
Bilirubin, Direct: 0 mg/dL (ref 0.0–0.3)
TOTAL PROTEIN: 6.9 g/dL (ref 6.0–8.3)
Total Bilirubin: 0.4 mg/dL (ref 0.2–1.2)

## 2015-05-07 LAB — LDL CHOLESTEROL, DIRECT: Direct LDL: 105 mg/dL

## 2015-05-08 ENCOUNTER — Telehealth: Payer: Self-pay | Admitting: Pharmacist Clinician (PhC)/ Clinical Pharmacy Specialist

## 2015-05-08 DIAGNOSIS — E7800 Pure hypercholesterolemia, unspecified: Secondary | ICD-10-CM

## 2015-05-08 MED ORDER — FENOFIBRATE 145 MG PO TABS: 145.0000 mg | ORAL_TABLET | Freq: Every day | ORAL | Status: DC

## 2015-05-08 NOTE — Telephone Encounter (Signed)
Spoke with patient about cholesterol labs.  Will have her increase Tricor from 48 to 145 mg daily with food.  She will also work on losing a few pounds and increasing her exercise.  Would suggest repeating labs in 6 months.

## 2015-05-10 ENCOUNTER — Ambulatory Visit: Payer: Medicare Other | Admitting: Pharmacist

## 2015-06-18 ENCOUNTER — Other Ambulatory Visit: Payer: Self-pay

## 2016-01-23 DIAGNOSIS — M545 Low back pain: Secondary | ICD-10-CM | POA: Diagnosis not present

## 2016-01-23 DIAGNOSIS — M5416 Radiculopathy, lumbar region: Secondary | ICD-10-CM | POA: Diagnosis not present

## 2016-01-23 DIAGNOSIS — M5432 Sciatica, left side: Secondary | ICD-10-CM | POA: Diagnosis not present

## 2016-01-29 DIAGNOSIS — M5440 Lumbago with sciatica, unspecified side: Secondary | ICD-10-CM | POA: Diagnosis not present

## 2016-01-30 DIAGNOSIS — M545 Low back pain: Secondary | ICD-10-CM | POA: Diagnosis not present

## 2016-01-30 DIAGNOSIS — M5416 Radiculopathy, lumbar region: Secondary | ICD-10-CM | POA: Diagnosis not present

## 2016-01-30 DIAGNOSIS — M5432 Sciatica, left side: Secondary | ICD-10-CM | POA: Diagnosis not present

## 2016-02-04 DIAGNOSIS — M545 Low back pain: Secondary | ICD-10-CM | POA: Diagnosis not present

## 2016-02-06 DIAGNOSIS — M5432 Sciatica, left side: Secondary | ICD-10-CM | POA: Diagnosis not present

## 2016-02-06 DIAGNOSIS — M545 Low back pain: Secondary | ICD-10-CM | POA: Diagnosis not present

## 2016-02-06 DIAGNOSIS — M5416 Radiculopathy, lumbar region: Secondary | ICD-10-CM | POA: Diagnosis not present

## 2016-02-13 DIAGNOSIS — M5416 Radiculopathy, lumbar region: Secondary | ICD-10-CM | POA: Diagnosis not present

## 2016-02-20 DIAGNOSIS — G4733 Obstructive sleep apnea (adult) (pediatric): Secondary | ICD-10-CM | POA: Diagnosis not present

## 2016-04-11 ENCOUNTER — Other Ambulatory Visit: Payer: Self-pay | Admitting: Internal Medicine

## 2016-04-30 DIAGNOSIS — D485 Neoplasm of uncertain behavior of skin: Secondary | ICD-10-CM | POA: Diagnosis not present

## 2016-04-30 DIAGNOSIS — L821 Other seborrheic keratosis: Secondary | ICD-10-CM | POA: Diagnosis not present

## 2016-04-30 DIAGNOSIS — L570 Actinic keratosis: Secondary | ICD-10-CM | POA: Diagnosis not present

## 2016-05-27 DIAGNOSIS — G4733 Obstructive sleep apnea (adult) (pediatric): Secondary | ICD-10-CM | POA: Diagnosis not present

## 2016-07-07 DIAGNOSIS — M5432 Sciatica, left side: Secondary | ICD-10-CM | POA: Diagnosis not present

## 2016-07-11 DIAGNOSIS — F5101 Primary insomnia: Secondary | ICD-10-CM | POA: Diagnosis not present

## 2016-07-11 DIAGNOSIS — M79641 Pain in right hand: Secondary | ICD-10-CM | POA: Diagnosis not present

## 2016-07-11 DIAGNOSIS — N183 Chronic kidney disease, stage 3 (moderate): Secondary | ICD-10-CM | POA: Diagnosis not present

## 2016-07-11 DIAGNOSIS — M79642 Pain in left hand: Secondary | ICD-10-CM | POA: Diagnosis not present

## 2016-07-11 DIAGNOSIS — M19042 Primary osteoarthritis, left hand: Secondary | ICD-10-CM | POA: Diagnosis not present

## 2016-07-11 DIAGNOSIS — M19041 Primary osteoarthritis, right hand: Secondary | ICD-10-CM | POA: Diagnosis not present

## 2016-08-28 DIAGNOSIS — G4733 Obstructive sleep apnea (adult) (pediatric): Secondary | ICD-10-CM | POA: Diagnosis not present

## 2016-09-24 DIAGNOSIS — N183 Chronic kidney disease, stage 3 (moderate): Secondary | ICD-10-CM | POA: Diagnosis not present

## 2016-09-24 DIAGNOSIS — E785 Hyperlipidemia, unspecified: Secondary | ICD-10-CM | POA: Diagnosis not present

## 2016-09-24 DIAGNOSIS — R7301 Impaired fasting glucose: Secondary | ICD-10-CM | POA: Diagnosis not present

## 2016-09-24 DIAGNOSIS — M19041 Primary osteoarthritis, right hand: Secondary | ICD-10-CM | POA: Diagnosis not present

## 2016-09-24 DIAGNOSIS — R200 Anesthesia of skin: Secondary | ICD-10-CM | POA: Diagnosis not present

## 2016-09-24 DIAGNOSIS — R202 Paresthesia of skin: Secondary | ICD-10-CM | POA: Diagnosis not present

## 2016-09-24 DIAGNOSIS — Z1389 Encounter for screening for other disorder: Secondary | ICD-10-CM | POA: Diagnosis not present

## 2016-09-24 DIAGNOSIS — G4733 Obstructive sleep apnea (adult) (pediatric): Secondary | ICD-10-CM | POA: Diagnosis not present

## 2016-09-24 DIAGNOSIS — Z79899 Other long term (current) drug therapy: Secondary | ICD-10-CM | POA: Diagnosis not present

## 2016-09-24 DIAGNOSIS — Z0000 Encounter for general adult medical examination without abnormal findings: Secondary | ICD-10-CM | POA: Diagnosis not present

## 2016-09-24 DIAGNOSIS — R413 Other amnesia: Secondary | ICD-10-CM | POA: Diagnosis not present

## 2016-11-03 DIAGNOSIS — M5441 Lumbago with sciatica, right side: Secondary | ICD-10-CM | POA: Diagnosis not present

## 2016-11-03 DIAGNOSIS — L609 Nail disorder, unspecified: Secondary | ICD-10-CM | POA: Diagnosis not present

## 2016-11-03 DIAGNOSIS — G3184 Mild cognitive impairment, so stated: Secondary | ICD-10-CM | POA: Diagnosis not present

## 2016-11-19 DIAGNOSIS — M5441 Lumbago with sciatica, right side: Secondary | ICD-10-CM | POA: Diagnosis not present

## 2016-11-28 DIAGNOSIS — G4733 Obstructive sleep apnea (adult) (pediatric): Secondary | ICD-10-CM | POA: Diagnosis not present

## 2016-12-04 DIAGNOSIS — M7061 Trochanteric bursitis, right hip: Secondary | ICD-10-CM | POA: Diagnosis not present

## 2016-12-04 DIAGNOSIS — M5416 Radiculopathy, lumbar region: Secondary | ICD-10-CM | POA: Diagnosis not present

## 2016-12-04 DIAGNOSIS — M545 Low back pain: Secondary | ICD-10-CM | POA: Diagnosis not present

## 2016-12-17 DIAGNOSIS — M5416 Radiculopathy, lumbar region: Secondary | ICD-10-CM | POA: Diagnosis not present

## 2016-12-17 DIAGNOSIS — M545 Low back pain: Secondary | ICD-10-CM | POA: Diagnosis not present

## 2017-01-06 DIAGNOSIS — M545 Low back pain: Secondary | ICD-10-CM | POA: Diagnosis not present

## 2017-01-06 DIAGNOSIS — M5416 Radiculopathy, lumbar region: Secondary | ICD-10-CM | POA: Diagnosis not present

## 2017-01-15 DIAGNOSIS — M545 Low back pain: Secondary | ICD-10-CM | POA: Diagnosis not present

## 2017-01-15 DIAGNOSIS — M5416 Radiculopathy, lumbar region: Secondary | ICD-10-CM | POA: Diagnosis not present

## 2017-01-26 DIAGNOSIS — M5416 Radiculopathy, lumbar region: Secondary | ICD-10-CM | POA: Diagnosis not present

## 2017-01-27 ENCOUNTER — Other Ambulatory Visit: Payer: Self-pay | Admitting: Neurological Surgery

## 2017-01-27 DIAGNOSIS — M5416 Radiculopathy, lumbar region: Secondary | ICD-10-CM

## 2017-02-02 ENCOUNTER — Ambulatory Visit
Admission: RE | Admit: 2017-02-02 | Discharge: 2017-02-02 | Disposition: A | Discharge status: Home / Self Care | Payer: Medicare Other | Source: Ambulatory Visit | Attending: Neurological Surgery | Admitting: Neurological Surgery

## 2017-02-02 DIAGNOSIS — M5416 Radiculopathy, lumbar region: Secondary | ICD-10-CM

## 2017-02-02 DIAGNOSIS — M48061 Spinal stenosis, lumbar region without neurogenic claudication: Secondary | ICD-10-CM | POA: Diagnosis not present

## 2017-02-02 IMAGING — CT CT L SPINE W/ CM
1 of 6 series · 6 of 14 positions shown, 8 images · non-contrast
Comparison: [DATE]

CLINICAL DATA: Back pain

EXAM:
CT MYELOGRAPHY LUMBAR SPINE
TECHNIQUE: CT imaging of the lumbar spine was performed after intrathecal
contrast administration. Multiplanar CT image reconstructions were
also generated.

[Series 2: l spine soft · axial · 0.28mm/px · z∈[-257,-92]mm · 6 of 78 slices shown, 8 images]
[im 12/78  soft-tissue]
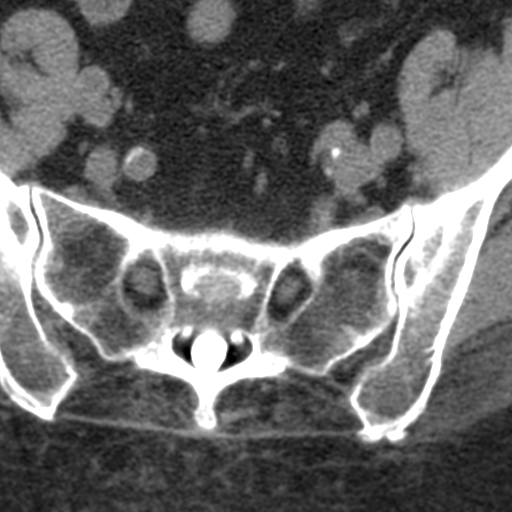
[im 12/78  bone]
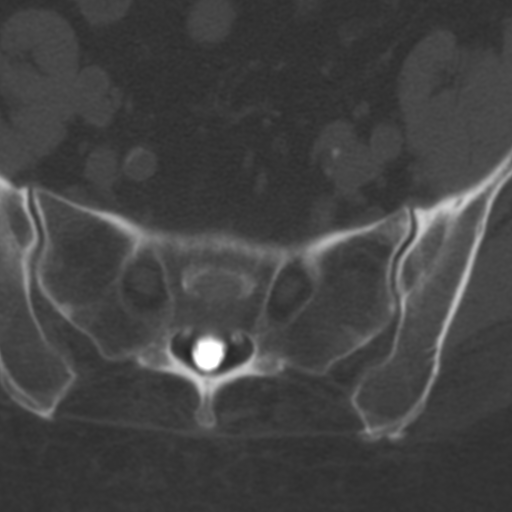
[im 23/78  bone]
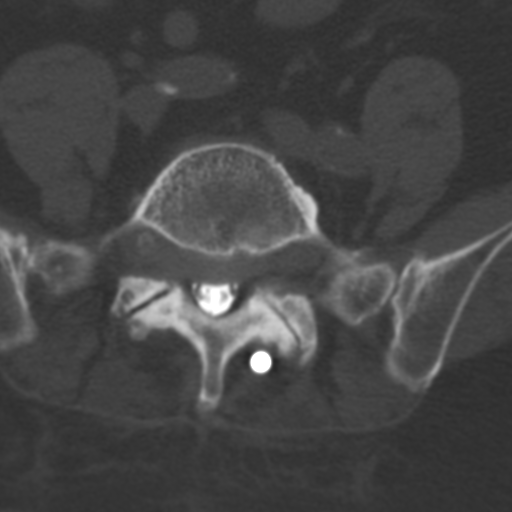
[im 34/78  bone]
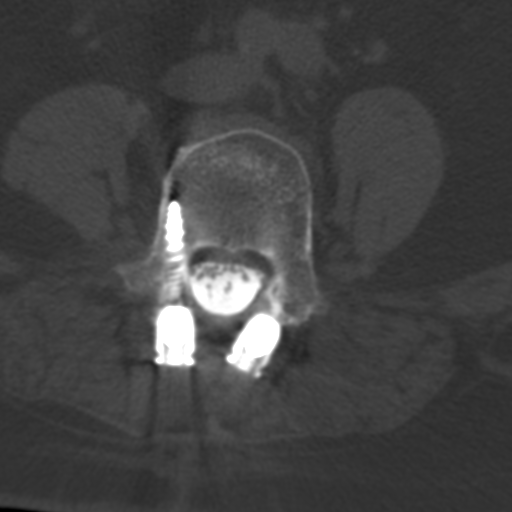
[im 45/78  bone]
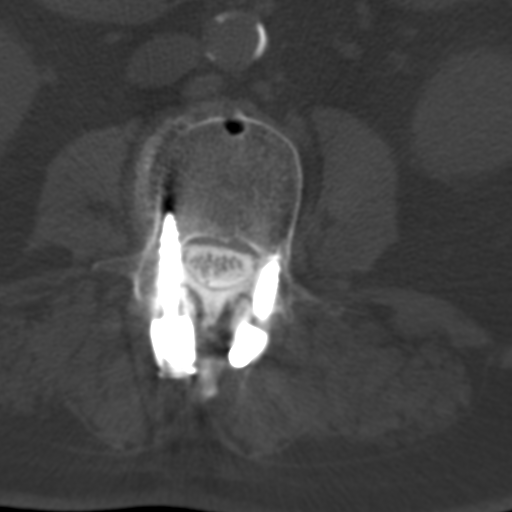
[im 56/78  soft-tissue]
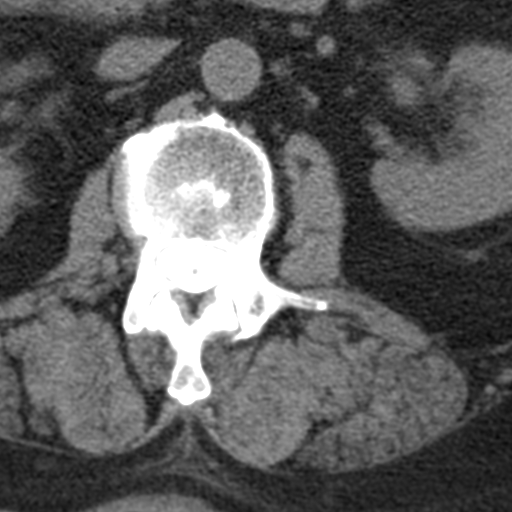
[im 56/78  bone]
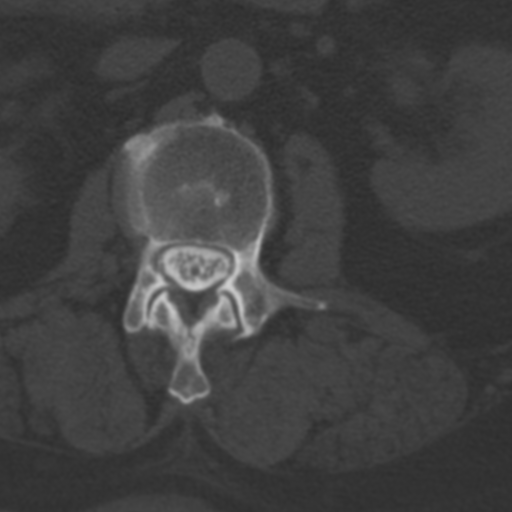
[im 67/78  bone]
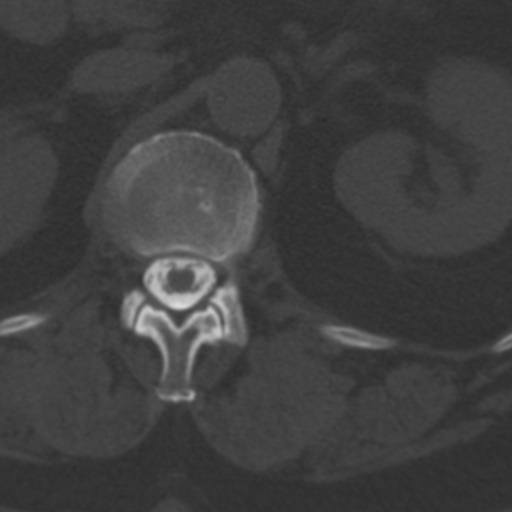

[6 of 14 positions shown; findings below may reference images not displayed]

FINDINGS: Segmentation:  5 lumbar type vertebral bodies.

Alignment: Mild levoscoliosis at L3-4. 1 mm anterolisthesis L5 upon
S1 in the supine position.

Vertebrae: No vertebral compression deformity. There are bilateral
pedicle screws in L3, L4, and L5 with posterior rods. There is no
breakage or loosening of the hardware. There are interposed disc
spacers.

Conus medullaris: Extends to the mid L2 level and appears normal.

Paraspinal and other soft tissues: Tiny calculi are present in the
lower pole of the left kidney. Aortic calcifications compatible with
atherosclerosis. No evidence of aortic aneurysm. Sigmoid
diverticulosis.

Disc levels:

L1-2: Left foraminal disc protrusion. No central stenosis. Minimal
left foraminal stenosis.

L2-3: There is severe disc space narrowing and a moderate concentric
disc bulge asymmetric to the right lateral recess and foraminal
location. This results in mild central stenosis. Mild bilateral
facet arthropathy. Mild left foraminal stenosis.

L3-4: Fusion through the disc space is solid. Posterior
decompression has been performed. Foramina are patent. No central
stenosis.

L4-5: Fusion through the disc space is solid. Central canal is
patent. Posterior decompression has been performed. No central
stenosis.

L5-S1: There is severe disc space narrowing and vacuum. There is
soft tissue extending from the location of the disc behind the L5
vertebral body extending cephalad. This may represent epidural
fibrosis or L5-S1 disc herniation with cephalad migration. This
results in no significant central stenosis but does encroach upon
the lateral recesses and foramina bilaterally right greater than
left.
IMPRESSION: L3 through L5 fusion as described. It is solid without neural
impingement or complication.

Degenerative disc disease at L2-3 results in mild central stenosis
and mild left foraminal stenosis.

There is soft tissue material extending cephalad from L5-S1 behind
the L5 vertebral body an into the bilateral foramina right greater
than left. Differential diagnosis includes epidural fibrosis or disc
material.

## 2017-02-02 IMAGING — XA DG MYELOGRAPHY LUMBAR INJ LUMBOSACRAL
7 of 16 series · 7 of 16 positions shown · non-contrast
Comparison: [DATE]

ADDENDUM:
Patient and friend called through answering service to notify me of
vomiting this evening of the procedure. She was nauseated while in
the car on the way home and has been vomited a few times once home.
The patient has been forcing fluids in attempt to follow discharge
instructions and this is clearly exacerbating her nausea. She
already has zofran and tylenol at home and wanted to be sure these
are ok after a myelogram. Headache is a secondary complaint,
seemingly typical post procedure discomfort. No weakness or other
worriesome features. She will call us if vomiting is not resolved in
the next 12 hours, sooner if worsening.
CLINICAL DATA: Back pain
TECHNIQUE: Contiguous axial images were obtained through the Lumbar spine after
the intrathecal infusion of infusion. Coronal and sagittal
reconstructions were obtained of the axial image sets.

[Series 1: ortho standard · 1 of 1 slices shown (1 of 4)]
[im 1/1]
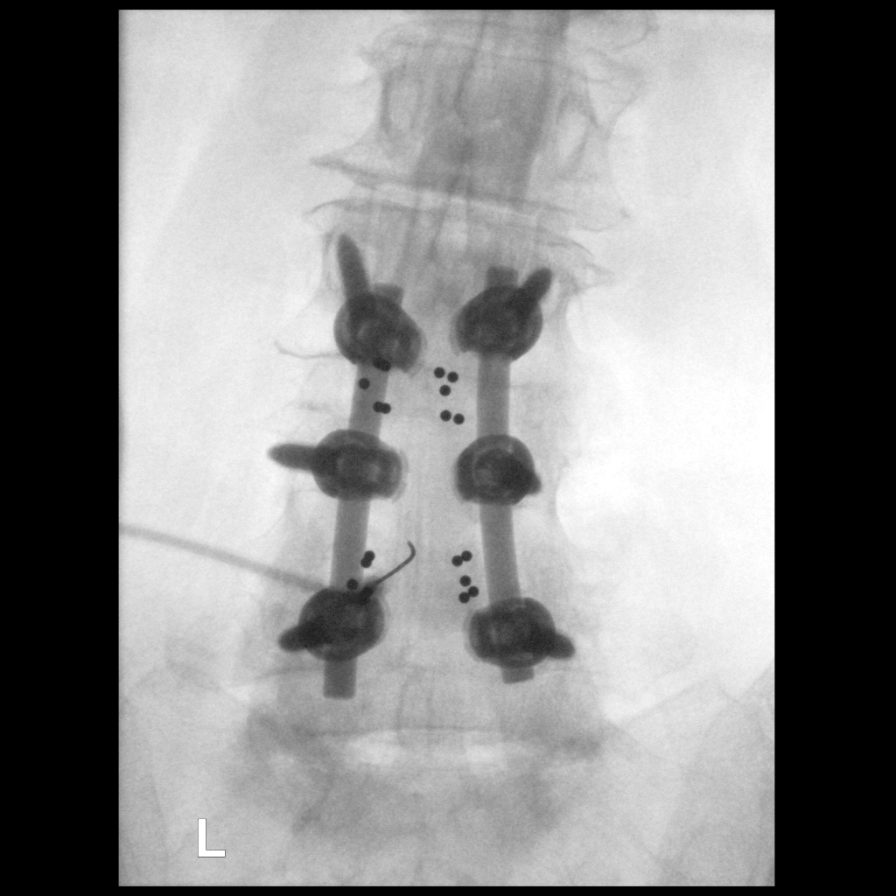

[Series 1: w lumbar spine lat · 0.15mm/px · 1 of 1 slices shown]
[im 1/1]
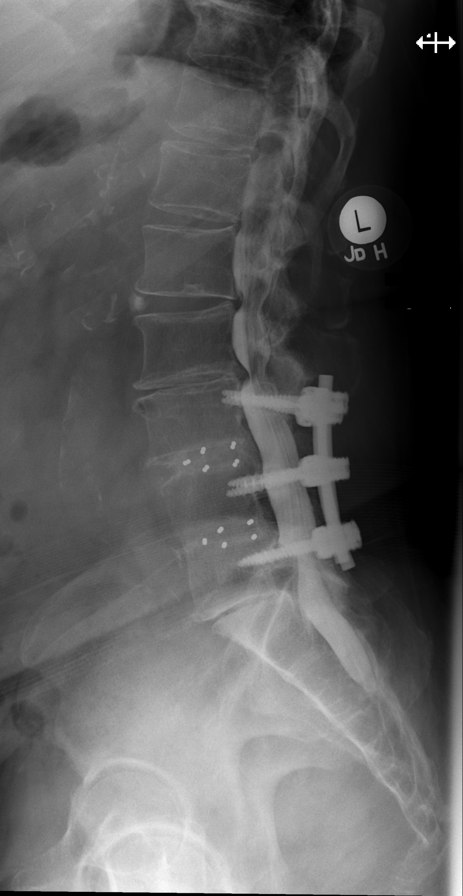

[Series 2: ortho standard · 1 of 1 slices shown (2 of 4)]
[im 1/1]
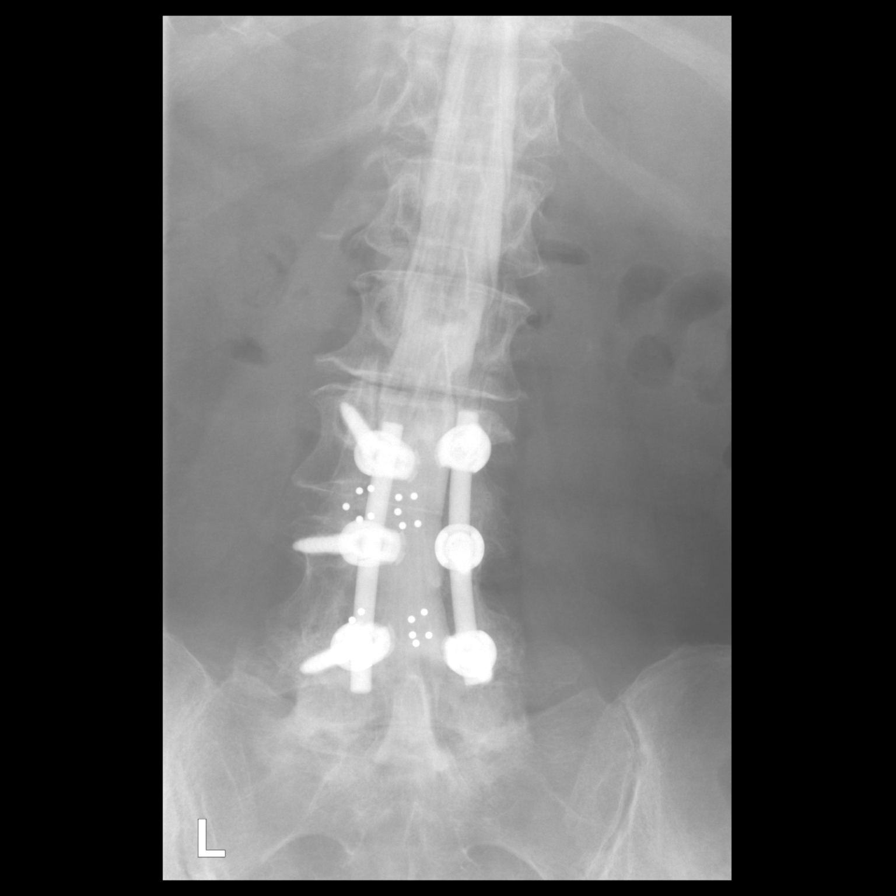

[Series 2: w lumbar spine flexion · 0.15mm/px · 1 of 1 slices shown]
[im 1/1]
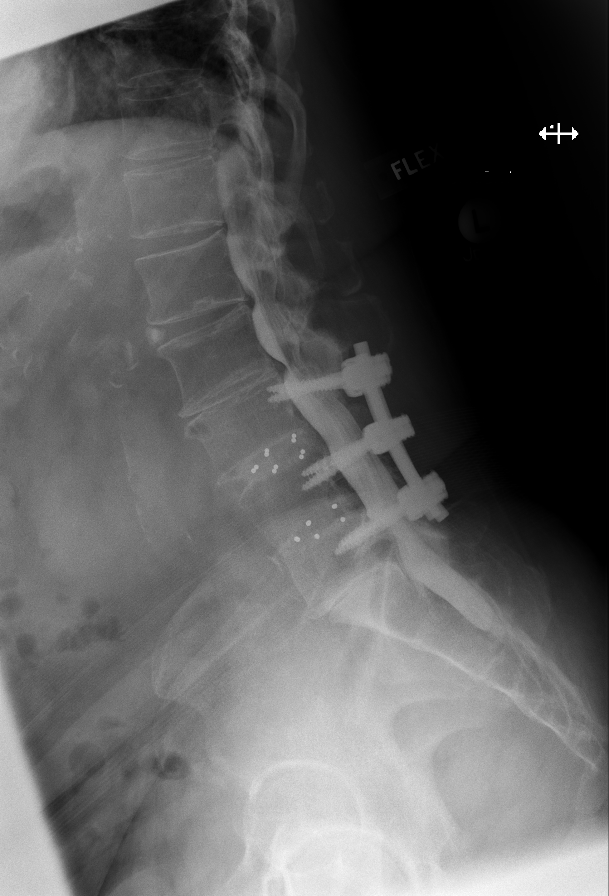

[Series 3: ortho standard · 1 of 1 slices shown (3 of 4)]
[im 1/1]
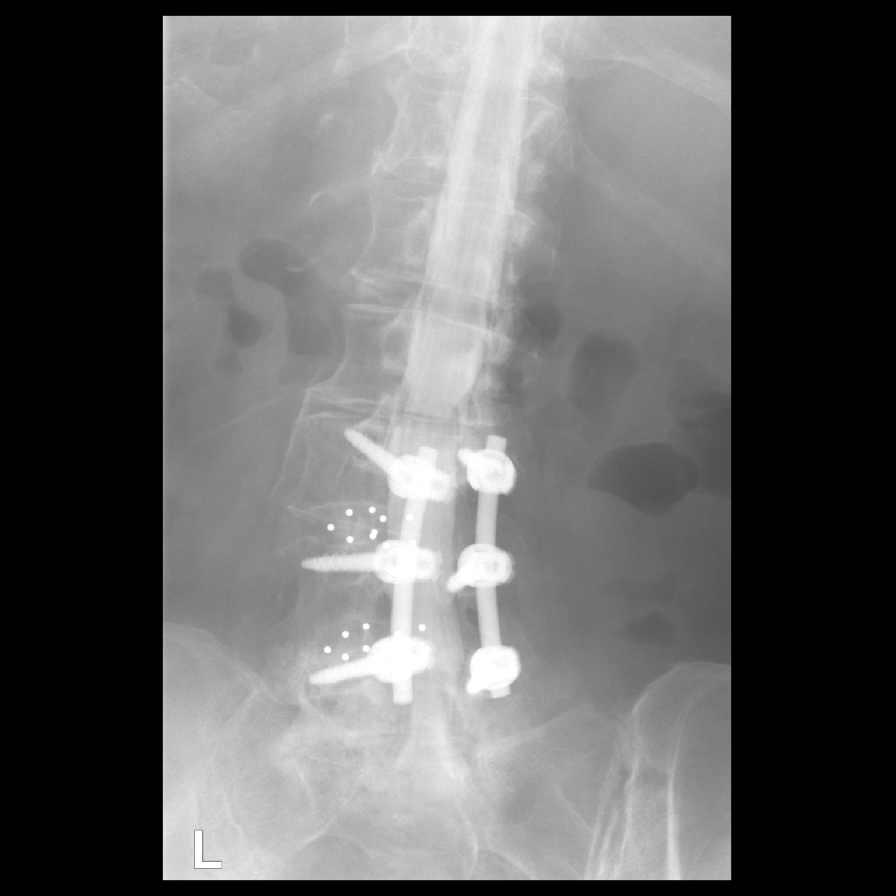

[Series 3: w lumbar spine extension · 0.15mm/px · 1 of 1 slices shown]
[im 1/1]
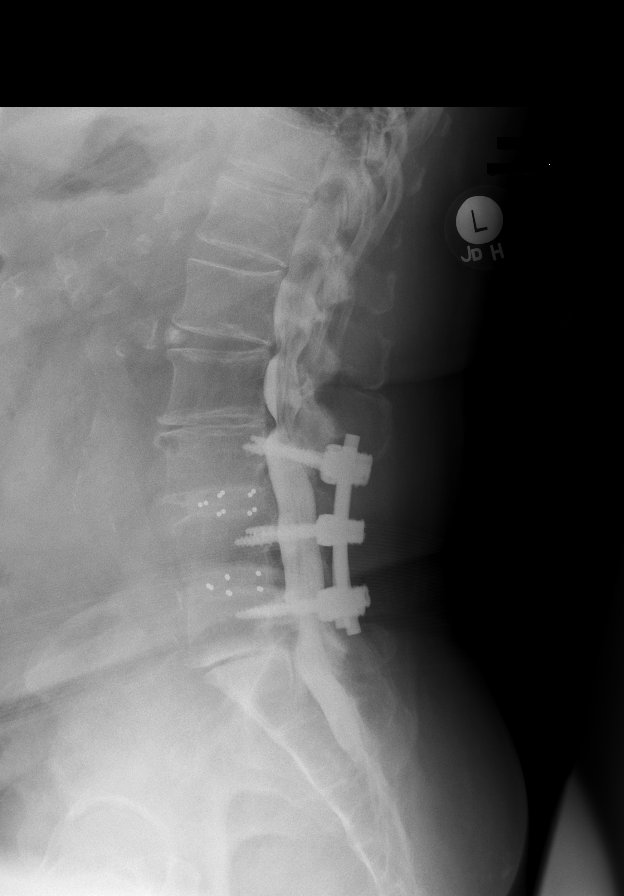

[Series 4: ortho standard · 1 of 1 slices shown (4 of 4)]
[im 1/1]
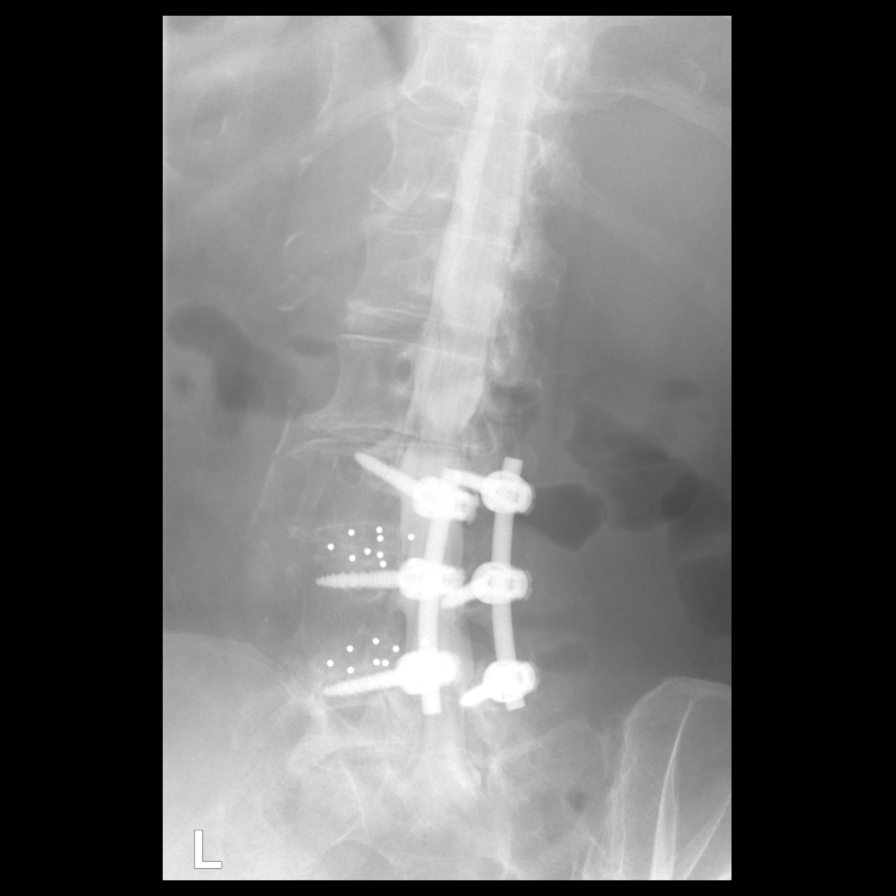

[7 of 16 positions shown; findings below may reference images not displayed]

EXAM:
LUMBAR MYELOGRAM

FLUOROSCOPY TIME:  37 seconds.  15.5 mGy.

PROCEDURE:
After thorough discussion of risks and benefits of the procedure
including bleeding, infection, injury to nerves, blood vessels,
adjacent structures as well as headache and CSF leak, written and
oral informed consent was obtained. Consent was obtained by Dr.
NEGRIN. Time out form was completed.

Patient was positioned prone on the fluoroscopy table. Local
anesthesia was provided with 1% lidocaine without epinephrine after
prepped and draped in the usual sterile fashion. Puncture was
performed at L4-5 using a 3 1/2 inch 22-gauge spinal needle via left
paramedian approach. Using a single pass through the dura, the
needle was placed within the thecal sac, with return of clear CSF.
15 mL of [Z0] was injected into the thecal sac, with normal
opacification of the nerve roots and cauda equina consistent with
free flow within the subarachnoid space.

I personally performed the lumbar puncture and administered the
intrathecal contrast. I also personally supervised acquisition of
the myelogram images.
FINDINGS: There are bilateral pedicle screws with posterior rods at L3, L4,
and L5. There are interposed disc spacers. No breakage or loosening
of the hardware. There is slight levoscoliosis at L3-4. There is
also 3 mm anterolisthesis L5 upon S1. No vertebral compression
deformity. Atherosclerotic calcifications are noted.

L1-2:  Mild disc space narrowing.  Mild disc bulge.

L2-3: Severe disc space narrowing with moderate disc bulge. This
results in some degree of spinal stenosis.

L3-4:  Fused level.  Central canal is patent.

L4-5:  Fused level.  Central canal is patent.

L5-S1: There is severe disc space narrowing. There is no spinal
stenosis evident by plain radiographs.

There is no evidence of instability on the flexion or extension
views.
IMPRESSION: L3 through L5 fusion without evidence of complication.

Degenerative disc disease at L1-2

Degenerative disc disease at L2-3 with some degree of spinal
stenosis

Severe disc space narrowing at L5-S1.

## 2017-02-02 MED ORDER — IOPAMIDOL (ISOVUE-M 200) INJECTION 41%
20.0000 mL | Freq: Once | INTRAMUSCULAR | Status: AC
  Administered 2017-02-02: 20 mL via INTRATHECAL

## 2017-02-02 MED ORDER — ONDANSETRON HCL 4 MG/2ML IJ SOLN: 4.0000 mg | Freq: Four times a day (QID) | INTRAMUSCULAR | Status: DC | PRN

## 2017-02-02 NOTE — Progress Notes (Signed)
Patient states she has been off Tramadol for at least the past two days.  jkl 

## 2017-02-02 NOTE — Discharge Instructions (Signed)
Myelogram Discharge Instructions  1. Go home and rest quietly for the next 24 hours.  It is important to lie flat for the next 24 hours.  Get up only to go to the restroom.  You may lie in the bed or on a couch on your back, your stomach, your left side or your right side.  You may have one pillow under your head.  You may have pillows between your knees while you are on your side or under your knees while you are on your back.  2. DO NOT drive today.  Recline the seat as far back as it will go, while still wearing your seat belt, on the way home.  3. You may get up to go to the bathroom as needed.  You may sit up for 10 minutes to eat.  You may resume your normal diet and medications unless otherwise indicated.  Drink plenty of extra fluids today and tomorrow.  4. The incidence of a spinal headache with nausea and/or vomiting is about 5% (one in 20 patients).  If you develop a headache, lie flat and drink plenty of fluids until the headache goes away.  Caffeinated beverages may be helpful.  If you develop severe nausea and vomiting or a headache that does not go away with flat bed rest, call [phone].  5. You may resume normal activities after your 24 hours of bed rest is over; however, do not exert yourself strongly or do any heavy lifting tomorrow.  6. Call your physician for a follow-up appointment.    You may resume Tramadol on Tuesday, February 03, 2017 after 10:30a.m.

## 2017-02-03 DIAGNOSIS — M5126 Other intervertebral disc displacement, lumbar region: Secondary | ICD-10-CM | POA: Diagnosis not present

## 2017-02-03 DIAGNOSIS — M5416 Radiculopathy, lumbar region: Secondary | ICD-10-CM | POA: Diagnosis not present

## 2017-02-04 ENCOUNTER — Telehealth: Payer: Self-pay

## 2017-02-04 NOTE — Telephone Encounter (Signed)
Spoke with patient after she had a myelogram here 02/02/17.  She denies any headache but reported developing motion sickness on her 30-minute ride home.  She was plagued with nausea (and a little vomiting) for about the entire 24 hours of bedrest but is fine now.  Lisa SievertJeanne Dahmir Epperly, RN

## 2017-02-12 DIAGNOSIS — M545 Low back pain: Secondary | ICD-10-CM | POA: Diagnosis not present

## 2017-02-12 DIAGNOSIS — M5416 Radiculopathy, lumbar region: Secondary | ICD-10-CM | POA: Diagnosis not present

## 2017-03-03 DIAGNOSIS — G4733 Obstructive sleep apnea (adult) (pediatric): Secondary | ICD-10-CM | POA: Diagnosis not present

## 2017-05-25 DIAGNOSIS — M5416 Radiculopathy, lumbar region: Secondary | ICD-10-CM | POA: Diagnosis not present

## 2017-05-25 DIAGNOSIS — Z6833 Body mass index (BMI) 33.0-33.9, adult: Secondary | ICD-10-CM | POA: Diagnosis not present

## 2017-05-25 DIAGNOSIS — I10 Essential (primary) hypertension: Secondary | ICD-10-CM | POA: Diagnosis not present

## 2017-06-04 DIAGNOSIS — G4733 Obstructive sleep apnea (adult) (pediatric): Secondary | ICD-10-CM | POA: Diagnosis not present

## 2017-06-22 DIAGNOSIS — E669 Obesity, unspecified: Secondary | ICD-10-CM | POA: Diagnosis not present

## 2017-06-22 DIAGNOSIS — N183 Chronic kidney disease, stage 3 (moderate): Secondary | ICD-10-CM | POA: Diagnosis not present

## 2017-06-22 DIAGNOSIS — I129 Hypertensive chronic kidney disease with stage 1 through stage 4 chronic kidney disease, or unspecified chronic kidney disease: Secondary | ICD-10-CM | POA: Diagnosis not present

## 2017-06-22 DIAGNOSIS — R51 Headache: Secondary | ICD-10-CM | POA: Diagnosis not present

## 2017-07-27 DIAGNOSIS — I129 Hypertensive chronic kidney disease with stage 1 through stage 4 chronic kidney disease, or unspecified chronic kidney disease: Secondary | ICD-10-CM | POA: Diagnosis not present

## 2017-07-27 DIAGNOSIS — N183 Chronic kidney disease, stage 3 (moderate): Secondary | ICD-10-CM | POA: Diagnosis not present

## 2017-07-27 DIAGNOSIS — M25561 Pain in right knee: Secondary | ICD-10-CM | POA: Diagnosis not present

## 2017-07-27 DIAGNOSIS — M549 Dorsalgia, unspecified: Secondary | ICD-10-CM | POA: Diagnosis not present

## 2017-07-29 DIAGNOSIS — Z1231 Encounter for screening mammogram for malignant neoplasm of breast: Secondary | ICD-10-CM | POA: Diagnosis not present

## 2017-08-03 DIAGNOSIS — M25562 Pain in left knee: Secondary | ICD-10-CM | POA: Diagnosis not present

## 2017-08-03 DIAGNOSIS — M545 Low back pain: Secondary | ICD-10-CM | POA: Diagnosis not present

## 2017-08-03 DIAGNOSIS — M1711 Unilateral primary osteoarthritis, right knee: Secondary | ICD-10-CM | POA: Diagnosis not present

## 2017-08-03 DIAGNOSIS — M5416 Radiculopathy, lumbar region: Secondary | ICD-10-CM | POA: Diagnosis not present

## 2017-08-10 DIAGNOSIS — D225 Melanocytic nevi of trunk: Secondary | ICD-10-CM | POA: Diagnosis not present

## 2017-08-10 DIAGNOSIS — B351 Tinea unguium: Secondary | ICD-10-CM | POA: Diagnosis not present

## 2017-08-10 DIAGNOSIS — L814 Other melanin hyperpigmentation: Secondary | ICD-10-CM | POA: Diagnosis not present

## 2017-08-10 DIAGNOSIS — L821 Other seborrheic keratosis: Secondary | ICD-10-CM | POA: Diagnosis not present

## 2017-08-28 DIAGNOSIS — R05 Cough: Secondary | ICD-10-CM | POA: Diagnosis not present

## 2017-08-28 DIAGNOSIS — K219 Gastro-esophageal reflux disease without esophagitis: Secondary | ICD-10-CM | POA: Diagnosis not present

## 2017-08-28 DIAGNOSIS — E669 Obesity, unspecified: Secondary | ICD-10-CM | POA: Diagnosis not present

## 2017-08-28 DIAGNOSIS — I129 Hypertensive chronic kidney disease with stage 1 through stage 4 chronic kidney disease, or unspecified chronic kidney disease: Secondary | ICD-10-CM | POA: Diagnosis not present

## 2017-09-04 DIAGNOSIS — G4733 Obstructive sleep apnea (adult) (pediatric): Secondary | ICD-10-CM | POA: Diagnosis not present

## 2017-09-08 DIAGNOSIS — H04123 Dry eye syndrome of bilateral lacrimal glands: Secondary | ICD-10-CM | POA: Diagnosis not present

## 2017-09-08 DIAGNOSIS — Z961 Presence of intraocular lens: Secondary | ICD-10-CM | POA: Diagnosis not present

## 2017-09-14 DIAGNOSIS — M48061 Spinal stenosis, lumbar region without neurogenic claudication: Secondary | ICD-10-CM | POA: Diagnosis not present

## 2017-09-14 DIAGNOSIS — M545 Low back pain: Secondary | ICD-10-CM | POA: Diagnosis not present

## 2017-10-01 DIAGNOSIS — N183 Chronic kidney disease, stage 3 (moderate): Secondary | ICD-10-CM | POA: Diagnosis not present

## 2017-10-01 DIAGNOSIS — Z0000 Encounter for general adult medical examination without abnormal findings: Secondary | ICD-10-CM | POA: Diagnosis not present

## 2017-10-01 DIAGNOSIS — G4733 Obstructive sleep apnea (adult) (pediatric): Secondary | ICD-10-CM | POA: Diagnosis not present

## 2017-10-01 DIAGNOSIS — I129 Hypertensive chronic kidney disease with stage 1 through stage 4 chronic kidney disease, or unspecified chronic kidney disease: Secondary | ICD-10-CM | POA: Diagnosis not present

## 2017-10-07 DIAGNOSIS — I129 Hypertensive chronic kidney disease with stage 1 through stage 4 chronic kidney disease, or unspecified chronic kidney disease: Secondary | ICD-10-CM | POA: Diagnosis not present

## 2017-10-07 DIAGNOSIS — E785 Hyperlipidemia, unspecified: Secondary | ICD-10-CM | POA: Diagnosis not present

## 2017-10-07 DIAGNOSIS — Z79899 Other long term (current) drug therapy: Secondary | ICD-10-CM | POA: Diagnosis not present

## 2017-12-07 DIAGNOSIS — G4733 Obstructive sleep apnea (adult) (pediatric): Secondary | ICD-10-CM | POA: Diagnosis not present

## 2017-12-21 ENCOUNTER — Ambulatory Visit
Admission: RE | Admit: 2017-12-21 | Discharge: 2017-12-21 | Disposition: A | Discharge status: Home / Self Care | Payer: Medicare Other | Source: Ambulatory Visit | Attending: Internal Medicine | Admitting: Internal Medicine

## 2017-12-21 ENCOUNTER — Other Ambulatory Visit: Payer: Self-pay | Admitting: Internal Medicine

## 2017-12-21 DIAGNOSIS — R197 Diarrhea, unspecified: Secondary | ICD-10-CM | POA: Diagnosis not present

## 2017-12-21 DIAGNOSIS — J329 Chronic sinusitis, unspecified: Secondary | ICD-10-CM | POA: Diagnosis not present

## 2017-12-21 DIAGNOSIS — Z20828 Contact with and (suspected) exposure to other viral communicable diseases: Secondary | ICD-10-CM | POA: Diagnosis not present

## 2017-12-21 DIAGNOSIS — J328 Other chronic sinusitis: Secondary | ICD-10-CM | POA: Diagnosis not present

## 2017-12-21 IMAGING — CT CT MAXILLOFACIAL W/O CM
2 of 3 series · 15 of 37 positions shown, 18 images · non-contrast
Comparison: None.

ADDENDUM:
Study discussed by telephone with Dr. GERGRLY on
[DATE] at [96] hours. We agreed on ENT follow-up.
CLINICAL DATA: 76-year-old female with persistent sinusitis
symptoms, congestion and drainage since [DATE] rounds of
antibiotics.

EXAM:
CT MAXILLOFACIAL WITHOUT CONTRAST
TECHNIQUE: Multidetector CT images of the paranasal sinuses were obtained using
the standard protocol without intravenous contrast.

[Series 601: coronal facial · coronal · 0.36mm/px · 3 of 93 slices shown]
[im 31/93  bone]
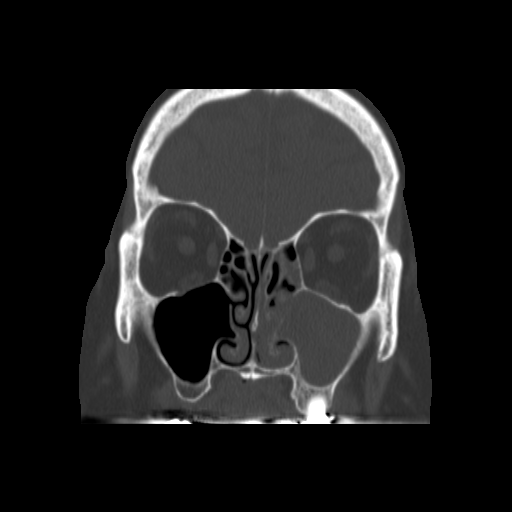
[im 47/93  bone]
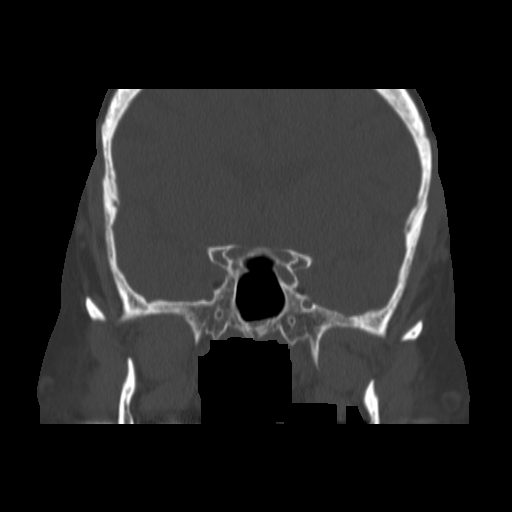
[im 62/93  bone]
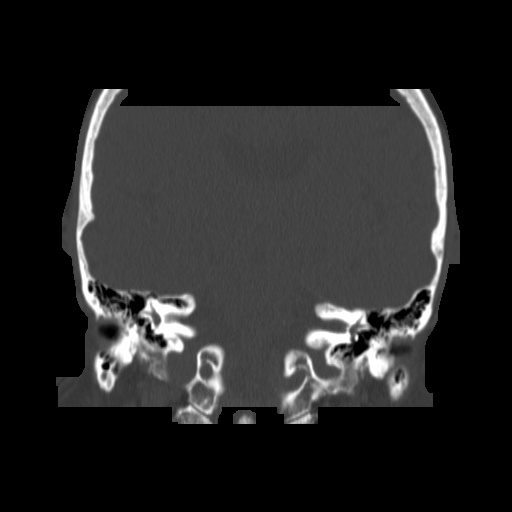

[Series 602: sagittal facial · sagittal · 0.36mm/px · 12 of 91 slices shown, 15 images]
[im 5/91  brain]
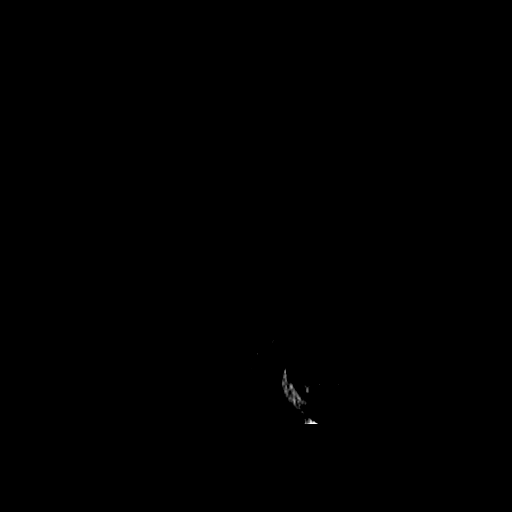
[im 5/91  bone]
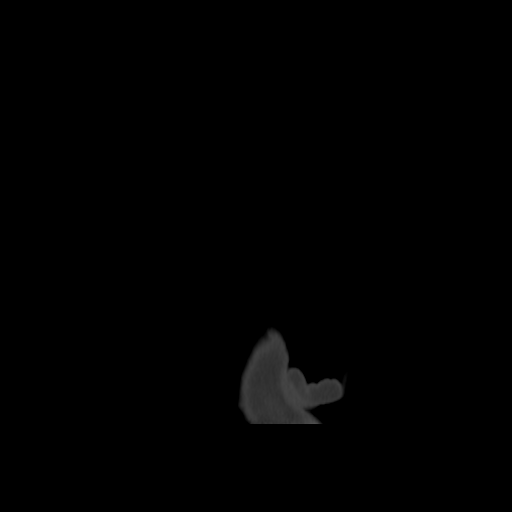
[im 15/91  bone]
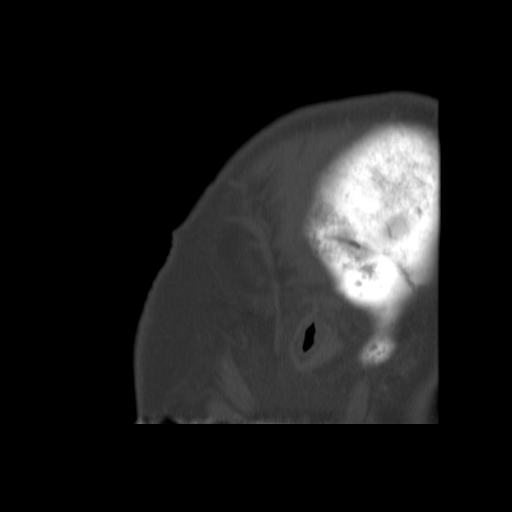
[im 19/91  bone]
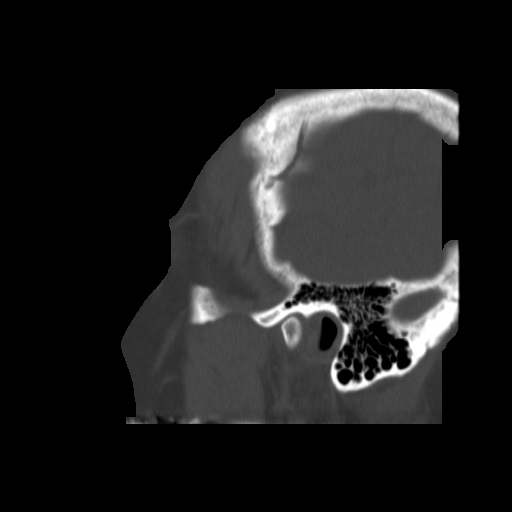
[im 29/91  bone]
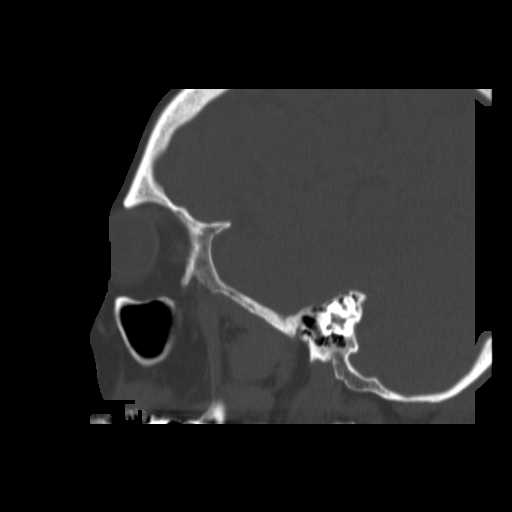
[im 34/91  brain]
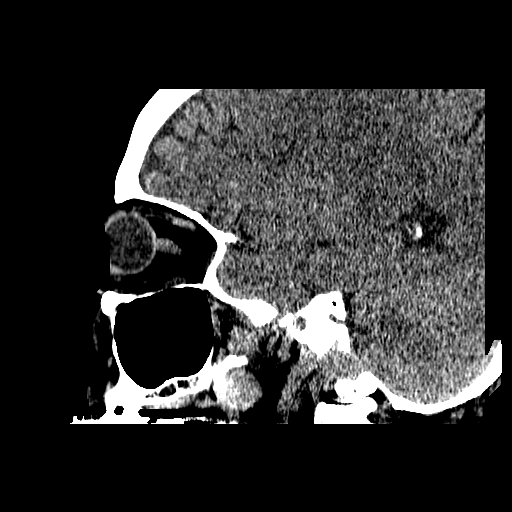
[im 34/91  bone]
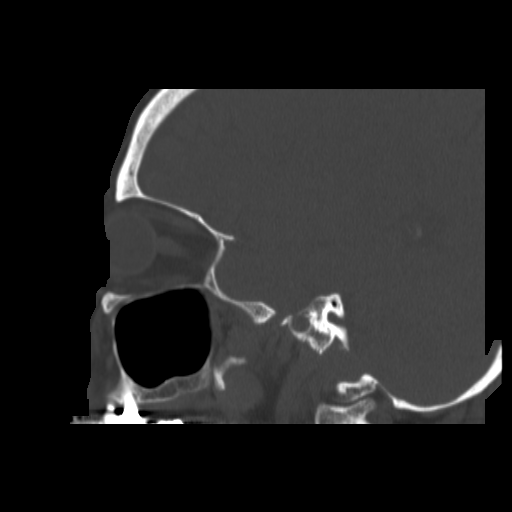
[im 43/91  bone]
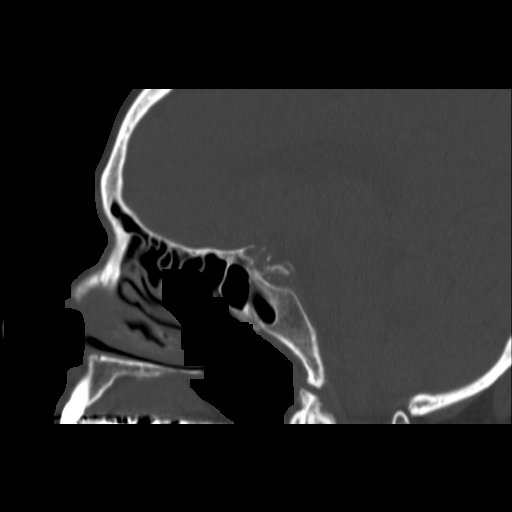
[im 48/91  bone]
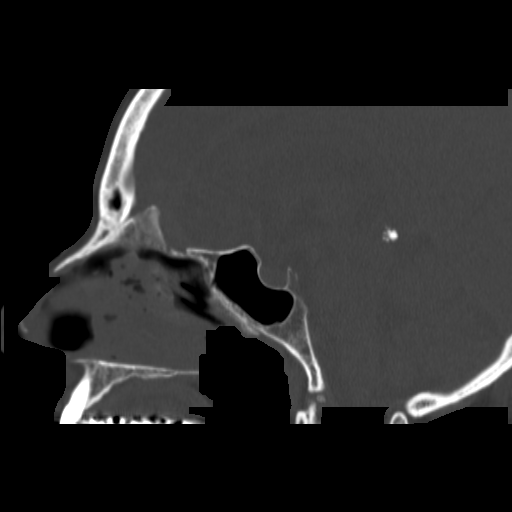
[im 57/91  bone]
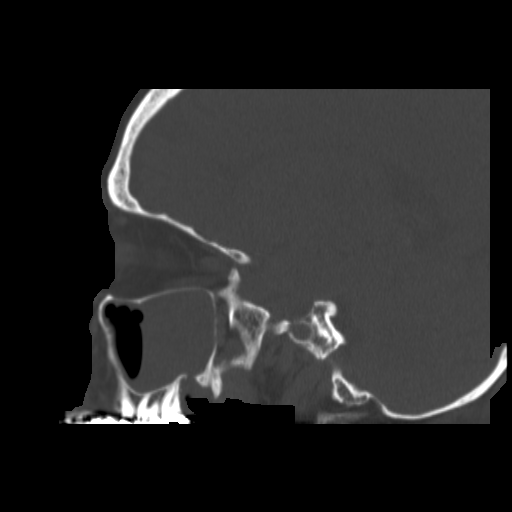
[im 62/91  brain]
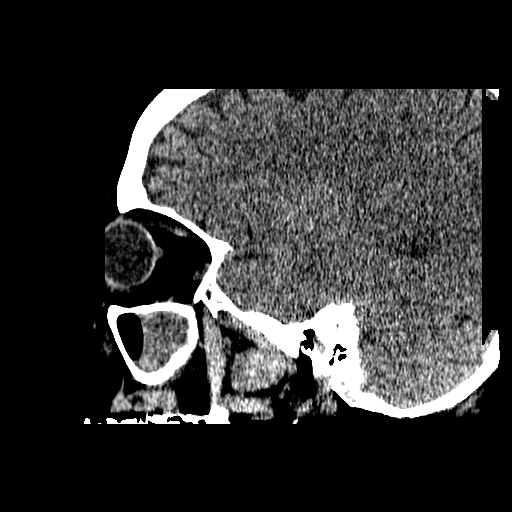
[im 62/91  bone]
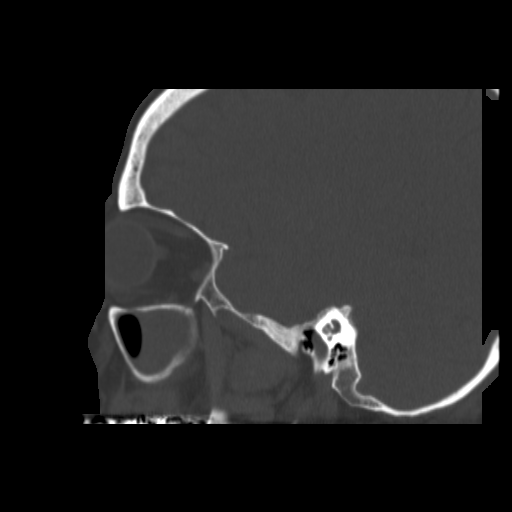
[im 72/91  bone]
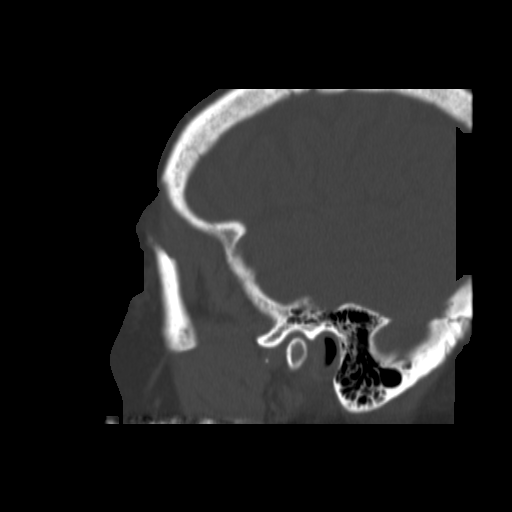
[im 76/91  bone]
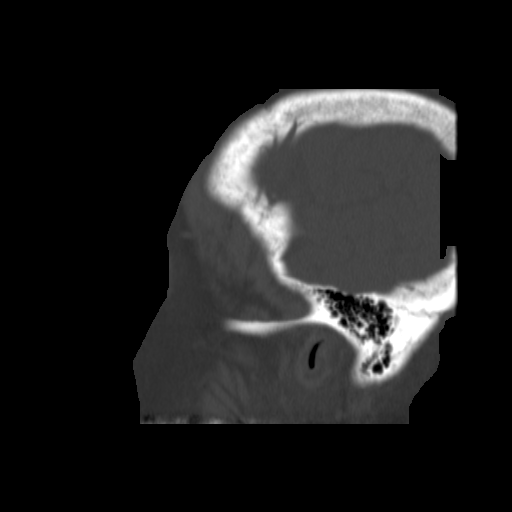
[im 86/91  bone]
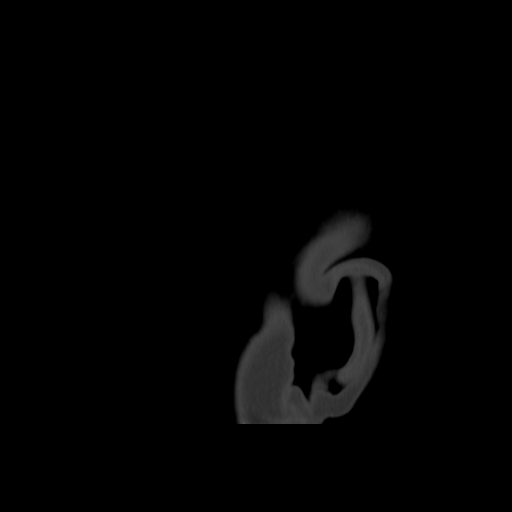

[15 of 37 positions shown; findings below may reference images not displayed]

FINDINGS: Paranasal sinuses:

Frontal: Somewhat hypoplastic. Clear aside from mild left
frontoethmoidal recess mucosal thickening. Patent right frontal
sinus drainage pathway.

Ethmoid: Mild to moderate left ethmoid sinus mucosal thickening,
primarily the low anterior left ethmoids.

Minimal right ethmoid sinus mucosal thickening.

Maxillary: The right maxillary sinus is clear.

Subtotal opacification of the left maxillary sinus with a sinus
fluid level (series 4, image 35). Furthermore, there is a
discontinuity of the posterolateral wall of the left maxillary sinus
(series 3, image 41) in proximity to what appears to be changes from
recent dental extraction at the level of the left maxillary wisdom
tooth. There is continuity from the wisdom tooth socket into the
posterior alveolar recess of the left maxillary sinus as seen on
sagittal image 57. Associated mild left retro maxillary soft tissue
thickening and stranding (series 4, image 42

Sphenoid: Normally aerated. Patent sphenoethmoidal recesses.

Right ostiomeatal unit: Patent.

Left ostiomeatal unit: Subtotally opacified.

Nasal passages: The nasal septum is intact and midline. Retained
secretions in the left nasal cavity which is partially opacified,
especially the middle meatus and posterior nasal cavity. Underlying
mild symmetric bilateral nasal cavity mucosal thickening.

Anatomy:

Anterior ethmoidal artery position suspected bilaterally on coronal
image 33.

Keros Type 1 olfactory fossa.

Other: Scattered cerebral white matter hypodensity in the visible
brain appears to be mild for age. Otherwise negative visualized
brain parenchyma. Calcified atherosclerosis at the skull base.

Postoperative changes to both globes, otherwise negative orbits soft
tissues. Visualized scalp soft tissues are within normal limits.
Negative visualized noncontrast deep soft tissue spaces of the face
aside from the left retro maxillary space stated above.

Bilateral tympanic cavities and mastoids are clear.

Left maxillary wisdom tooth extraction site as stated above. Other
visible maxillary dentition is within normal limits; remote right
side maxillary molar an wisdom tooth extraction.
IMPRESSION: 1. Fluid level opacifying much of the left maxillary sinus in
association with mild fracture and/or a communication from the
alveolar recess of the sinus into what appears to be a recent tooth
extraction site of the left maxillary wisdom tooth. Therefore,
suspect odontogenic infection of the sinus, with mild associated
retro maxillary soft tissue stranding.
2. Lesser inflammation superimposed in the left ethmoid sinuses and
left nasal cavity.
3. The other paranasal sinuses are normally aerated.

## 2017-12-23 DIAGNOSIS — J320 Chronic maxillary sinusitis: Secondary | ICD-10-CM | POA: Diagnosis not present

## 2017-12-23 DIAGNOSIS — R197 Diarrhea, unspecified: Secondary | ICD-10-CM | POA: Diagnosis not present

## 2018-01-05 DIAGNOSIS — J0100 Acute maxillary sinusitis, unspecified: Secondary | ICD-10-CM | POA: Diagnosis not present

## 2018-01-06 ENCOUNTER — Telehealth (HOSPITAL_COMMUNITY): Payer: Self-pay | Admitting: *Deleted

## 2018-01-06 ENCOUNTER — Other Ambulatory Visit: Payer: Self-pay | Admitting: Geriatric Medicine

## 2018-01-06 DIAGNOSIS — Z6833 Body mass index (BMI) 33.0-33.9, adult: Secondary | ICD-10-CM | POA: Diagnosis not present

## 2018-01-06 DIAGNOSIS — R072 Precordial pain: Secondary | ICD-10-CM

## 2018-01-06 DIAGNOSIS — E669 Obesity, unspecified: Secondary | ICD-10-CM | POA: Diagnosis not present

## 2018-01-06 DIAGNOSIS — J0100 Acute maxillary sinusitis, unspecified: Secondary | ICD-10-CM | POA: Diagnosis not present

## 2018-01-06 NOTE — Telephone Encounter (Signed)
Patient given detailed instructions per Myocardial Perfusion Study Information Sheet for the test on 01/08/18 at 11:00. Patient notified to arrive 15 minutes early and that it is imperative to arrive on time for appointment to keep from having the test rescheduled.  If you need to cancel or reschedule your appointment, please call the office within 24 hours of your appointment. . Patient verbalized understanding.Daneil DolinSharon S Brooks

## 2018-01-08 ENCOUNTER — Ambulatory Visit (HOSPITAL_COMMUNITY): Payer: Medicare Other | Attending: Cardiology

## 2018-01-08 DIAGNOSIS — R072 Precordial pain: Secondary | ICD-10-CM | POA: Diagnosis not present

## 2018-01-08 DIAGNOSIS — R079 Chest pain, unspecified: Secondary | ICD-10-CM | POA: Insufficient documentation

## 2018-01-08 LAB — MYOCARDIAL PERFUSION IMAGING
CHL CUP NUCLEAR SRS: 0
CSEPPHR: 97 {beats}/min
LV sys vol: 40 mL
LVDIAVOL: 95 mL (ref 46–106)
NUC STRESS TID: 0.86
RATE: 0.31
Rest HR: 63 {beats}/min
SDS: 0
SSS: 0

## 2018-01-08 IMAGING — NM NM MISC PROCEDURE
9 series · 54 of 54 positions shown · non-contrast
Comparison: none

[Series 1: wbr_s-proj_st stress-gsp · 6.40mm/px · 6 of 512 frames shown]
[frame 43/512]
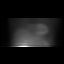
[frame 128/512]
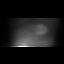
[frame 214/512]
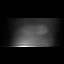
[frame 299/512]
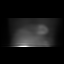
[frame 384/512]
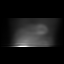
[frame 470/512]
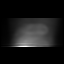

[Series 1: stress-sum-em · 6.40mm/px · 6 of 64 frames shown]
[frame 6/64]
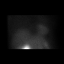
[frame 16/64]
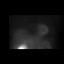
[frame 27/64]
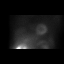
[frame 38/64]
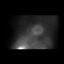
[frame 48/64]
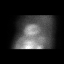
[frame 59/64]
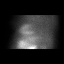

[Series 1: rest_(id)_sa · 6.4mm · 6.40mm/px · 6 of 64 frames shown]
[frame 6/64]
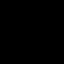
[frame 16/64]
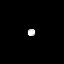
[frame 27/64]
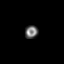
[frame 38/64]
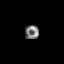
[frame 48/64]
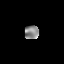
[frame 59/64]
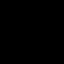

[Series 1: stress-gsp_(id)_sa · 6.4mm · 6.40mm/px · 6 of 512 frames shown]
[frame 43/512]
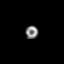
[frame 128/512]
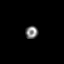
[frame 214/512]
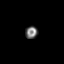
[frame 299/512]
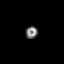
[frame 384/512]
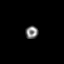
[frame 470/512]
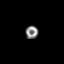

[Series 1: wbr_s-proj_st stress-sum-em · 6.40mm/px · 6 of 64 frames shown]
[frame 6/64]
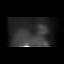
[frame 16/64]
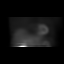
[frame 27/64]
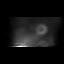
[frame 38/64]
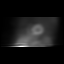
[frame 48/64]
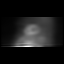
[frame 59/64]
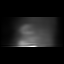

[Series 1: stress-sum-em_(id)_sa · 6.4mm · 6.40mm/px · 6 of 64 frames shown]
[frame 6/64]
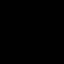
[frame 16/64]
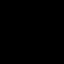
[frame 27/64]
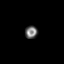
[frame 38/64]
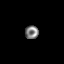
[frame 48/64]
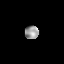
[frame 59/64]
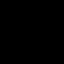

[Series 1: stress-gsp · 6.40mm/px · 6 of 512 frames shown]
[frame 43/512]
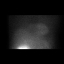
[frame 128/512]
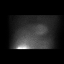
[frame 214/512]
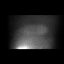
[frame 299/512]
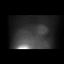
[frame 384/512]
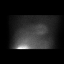
[frame 470/512]
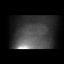

[Series 1: wbr_r-proj_st rest · 6.40mm/px · 6 of 64 frames shown]
[frame 6/64]
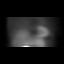
[frame 16/64]
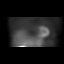
[frame 27/64]
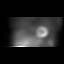
[frame 38/64]
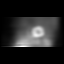
[frame 48/64]
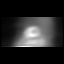
[frame 59/64]
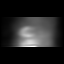

[Series 1: rest · 6.40mm/px · 6 of 64 frames shown]
[frame 6/64]
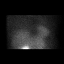
[frame 16/64]
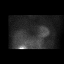
[frame 27/64]
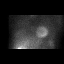
[frame 38/64]
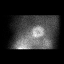
[frame 48/64]
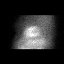
[frame 59/64]
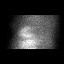

[54 of 54 positions shown; findings below may reference images not displayed]

Canned report from images found in remote index.

Refer to host system for actual result text.

## 2018-01-08 MED ORDER — TECHNETIUM TC 99M TETROFOSMIN IV KIT
31.7000 | PACK | Freq: Once | INTRAVENOUS | Status: AC | PRN
  Administered 2018-01-08: 31.7 via INTRAVENOUS
  Filled 2018-01-08: qty 32

## 2018-01-08 MED ORDER — REGADENOSON 0.4 MG/5ML IV SOLN
0.4000 mg | Freq: Once | INTRAVENOUS | Status: AC
  Administered 2018-01-08: 0.4 mg via INTRAVENOUS

## 2018-01-08 MED ORDER — TECHNETIUM TC 99M TETROFOSMIN IV KIT
10.5000 | PACK | Freq: Once | INTRAVENOUS | Status: AC | PRN
  Administered 2018-01-08: 10.5 via INTRAVENOUS
  Filled 2018-01-08: qty 11

## 2018-01-30 DIAGNOSIS — H8110 Benign paroxysmal vertigo, unspecified ear: Secondary | ICD-10-CM | POA: Diagnosis not present

## 2018-02-25 DIAGNOSIS — D229 Melanocytic nevi, unspecified: Secondary | ICD-10-CM | POA: Diagnosis not present

## 2018-02-25 DIAGNOSIS — D1801 Hemangioma of skin and subcutaneous tissue: Secondary | ICD-10-CM | POA: Diagnosis not present

## 2018-02-25 DIAGNOSIS — L821 Other seborrheic keratosis: Secondary | ICD-10-CM | POA: Diagnosis not present

## 2018-02-25 DIAGNOSIS — L814 Other melanin hyperpigmentation: Secondary | ICD-10-CM | POA: Diagnosis not present

## 2018-03-10 DIAGNOSIS — G4733 Obstructive sleep apnea (adult) (pediatric): Secondary | ICD-10-CM | POA: Diagnosis not present

## 2018-03-12 DIAGNOSIS — Z79899 Other long term (current) drug therapy: Secondary | ICD-10-CM | POA: Diagnosis not present

## 2018-03-12 DIAGNOSIS — R05 Cough: Secondary | ICD-10-CM | POA: Diagnosis not present

## 2018-03-12 DIAGNOSIS — H8309 Labyrinthitis, unspecified ear: Secondary | ICD-10-CM | POA: Diagnosis not present

## 2018-03-12 DIAGNOSIS — I129 Hypertensive chronic kidney disease with stage 1 through stage 4 chronic kidney disease, or unspecified chronic kidney disease: Secondary | ICD-10-CM | POA: Diagnosis not present

## 2018-06-10 DIAGNOSIS — G4733 Obstructive sleep apnea (adult) (pediatric): Secondary | ICD-10-CM | POA: Diagnosis not present

## 2018-06-16 DIAGNOSIS — I129 Hypertensive chronic kidney disease with stage 1 through stage 4 chronic kidney disease, or unspecified chronic kidney disease: Secondary | ICD-10-CM | POA: Diagnosis not present

## 2018-06-16 DIAGNOSIS — E669 Obesity, unspecified: Secondary | ICD-10-CM | POA: Diagnosis not present

## 2018-06-16 DIAGNOSIS — N183 Chronic kidney disease, stage 3 (moderate): Secondary | ICD-10-CM | POA: Diagnosis not present

## 2018-06-16 DIAGNOSIS — M549 Dorsalgia, unspecified: Secondary | ICD-10-CM | POA: Diagnosis not present

## 2018-06-18 DIAGNOSIS — I129 Hypertensive chronic kidney disease with stage 1 through stage 4 chronic kidney disease, or unspecified chronic kidney disease: Secondary | ICD-10-CM | POA: Diagnosis not present

## 2018-06-18 DIAGNOSIS — M179 Osteoarthritis of knee, unspecified: Secondary | ICD-10-CM | POA: Diagnosis not present

## 2018-06-18 DIAGNOSIS — E785 Hyperlipidemia, unspecified: Secondary | ICD-10-CM | POA: Diagnosis not present

## 2018-06-18 DIAGNOSIS — N183 Chronic kidney disease, stage 3 (moderate): Secondary | ICD-10-CM | POA: Diagnosis not present

## 2018-07-12 DIAGNOSIS — M48061 Spinal stenosis, lumbar region without neurogenic claudication: Secondary | ICD-10-CM | POA: Diagnosis not present

## 2018-07-12 DIAGNOSIS — M545 Low back pain: Secondary | ICD-10-CM | POA: Diagnosis not present

## 2018-07-13 DIAGNOSIS — N183 Chronic kidney disease, stage 3 (moderate): Secondary | ICD-10-CM | POA: Diagnosis not present

## 2018-07-13 DIAGNOSIS — E785 Hyperlipidemia, unspecified: Secondary | ICD-10-CM | POA: Diagnosis not present

## 2018-07-13 DIAGNOSIS — I129 Hypertensive chronic kidney disease with stage 1 through stage 4 chronic kidney disease, or unspecified chronic kidney disease: Secondary | ICD-10-CM | POA: Diagnosis not present

## 2018-07-13 DIAGNOSIS — M179 Osteoarthritis of knee, unspecified: Secondary | ICD-10-CM | POA: Diagnosis not present

## 2018-07-19 DIAGNOSIS — G4733 Obstructive sleep apnea (adult) (pediatric): Secondary | ICD-10-CM | POA: Diagnosis not present

## 2018-08-03 DIAGNOSIS — I129 Hypertensive chronic kidney disease with stage 1 through stage 4 chronic kidney disease, or unspecified chronic kidney disease: Secondary | ICD-10-CM | POA: Diagnosis not present

## 2018-08-03 DIAGNOSIS — M179 Osteoarthritis of knee, unspecified: Secondary | ICD-10-CM | POA: Diagnosis not present

## 2018-08-03 DIAGNOSIS — M5137 Other intervertebral disc degeneration, lumbosacral region: Secondary | ICD-10-CM | POA: Diagnosis not present

## 2018-08-03 DIAGNOSIS — M545 Low back pain: Secondary | ICD-10-CM | POA: Diagnosis not present

## 2018-08-03 DIAGNOSIS — N183 Chronic kidney disease, stage 3 (moderate): Secondary | ICD-10-CM | POA: Diagnosis not present

## 2018-08-03 DIAGNOSIS — E785 Hyperlipidemia, unspecified: Secondary | ICD-10-CM | POA: Diagnosis not present

## 2018-08-03 DIAGNOSIS — M48061 Spinal stenosis, lumbar region without neurogenic claudication: Secondary | ICD-10-CM | POA: Diagnosis not present

## 2018-08-11 DIAGNOSIS — G4733 Obstructive sleep apnea (adult) (pediatric): Secondary | ICD-10-CM | POA: Diagnosis not present

## 2018-08-19 DIAGNOSIS — G4733 Obstructive sleep apnea (adult) (pediatric): Secondary | ICD-10-CM | POA: Diagnosis not present

## 2018-08-24 DIAGNOSIS — G4733 Obstructive sleep apnea (adult) (pediatric): Secondary | ICD-10-CM | POA: Diagnosis not present

## 2018-08-24 DIAGNOSIS — M545 Low back pain: Secondary | ICD-10-CM | POA: Diagnosis not present

## 2018-08-24 DIAGNOSIS — M48061 Spinal stenosis, lumbar region without neurogenic claudication: Secondary | ICD-10-CM | POA: Diagnosis not present

## 2018-08-24 DIAGNOSIS — M5137 Other intervertebral disc degeneration, lumbosacral region: Secondary | ICD-10-CM | POA: Diagnosis not present

## 2018-08-25 DIAGNOSIS — Z1231 Encounter for screening mammogram for malignant neoplasm of breast: Secondary | ICD-10-CM | POA: Diagnosis not present

## 2018-09-03 DIAGNOSIS — M545 Low back pain: Secondary | ICD-10-CM | POA: Diagnosis not present

## 2018-09-03 DIAGNOSIS — M48061 Spinal stenosis, lumbar region without neurogenic claudication: Secondary | ICD-10-CM | POA: Diagnosis not present

## 2018-09-03 DIAGNOSIS — M5137 Other intervertebral disc degeneration, lumbosacral region: Secondary | ICD-10-CM | POA: Diagnosis not present

## 2018-09-13 DIAGNOSIS — L600 Ingrowing nail: Secondary | ICD-10-CM | POA: Diagnosis not present

## 2018-09-13 DIAGNOSIS — Z23 Encounter for immunization: Secondary | ICD-10-CM | POA: Diagnosis not present

## 2018-09-14 ENCOUNTER — Ambulatory Visit: Payer: Medicare Other | Admitting: Podiatry

## 2018-09-14 VITALS — BP 164/86 | HR 69

## 2018-09-14 DIAGNOSIS — L539 Erythematous condition, unspecified: Secondary | ICD-10-CM | POA: Diagnosis not present

## 2018-09-14 DIAGNOSIS — L600 Ingrowing nail: Secondary | ICD-10-CM | POA: Diagnosis not present

## 2018-09-14 MED ORDER — CEPHALEXIN 500 MG PO CAPS: 500.0000 mg | ORAL_CAPSULE | Freq: Three times a day (TID) | ORAL | 2 refills | Status: DC

## 2018-09-14 NOTE — Patient Instructions (Signed)

## 2018-09-15 NOTE — Progress Notes (Signed)
Subjective:   Patient ID: Lisa [name], female   DOB: 77 y.o.   MRN: [id number]   HPI 77 year old female presents the office today for concerns of a left big toe pain, swelling, redness and possible ingrown toenail.  She states that this is been ongoing for last couple weeks and is actually gotten better.  She denies any drainage or pus coming from the toenail site.  She also is concerned with her second toenail starting to grow when she will point after an injury that she had previously.  She has no pain but this toenail has been a chronic issue and she denies any redness or drainage or any swelling.   Review of Systems  All other systems reviewed and are negative.  Past Medical History:  Diagnosis Date  . Acne rosacea   . Allergic rhinitis   . Chronic kidney disease    kidney stones, Nov. 2013- tx with Lithotripsy, still has some present   . Cough variant asthma   . Degenerative disc disease    knees, ankles, back- OA  . GERD (gastroesophageal reflux disease)   . Headache(784.0)    since stopping aleve- 07/30/2013  . Hyperlipemia    dyslipidemia  . Hypertension    [facility] cardiac pharmacist follows pt. Riki Rusk Smart ,has never taken anti-hypertensive  . Osteoarthritis   . Plantar fasciitis    (using orthotics-they are wearing out)- Dr Luther Bradley  . Pneumonia    double pneumonia - relative to sinus infection , had chemical cauterization   . PONV (postoperative nausea and vomiting)    urgency, N&V for days, memory & processing  . Sleep apnea    Bringing cpap mask and tubing, study last done- 2006    Past Surgical History:  Procedure Laterality Date  . ABDOMINAL HYSTERECTOMY  1990's  . BLADDER REPAIR    . BLADDER SUSPENSION  90's  . MAXIMUM ACCESS (MAS)POSTERIOR LUMBAR INTERBODY FUSION (PLIF) 2 LEVEL N/A 08/12/2013   Procedure: FOR MAXIMUM ACCESS (MAS) POSTERIOR LUMBAR INTERBODY FUSION (PLIF) 2 LEVEL;  Surgeon: Tia Alert, MD;  Location: [facility];  Service:  Neurosurgery;  Laterality: N/A;  FOR MAXIMUM ACCESS (MAS) POSTERIOR LUMBAR INTERBODY FUSION (PLIF) 2 LEVEL  . MULTIPLE TOOTH EXTRACTIONS     wisdom teeth extractions   . POSTERIOR FUSION LUMBAR SPINE  08/12/2013   Dr Yetta Barre  . TONSILLECTOMY       Current Outpatient Medications:  .  acetaminophen (TYLENOL) 500 MG tablet, Take 500 mg by mouth every 6 (six) hours as needed for pain., Disp: , Rfl:  .  cephALEXin (KEFLEX) 500 MG capsule, Take 1 capsule (500 mg total) by mouth 3 (three) times daily., Disp: 30 capsule, Rfl: 2 .  Cholecalciferol (VITAMIN D) 2000 UNITS tablet, Take 2,000 Units by mouth daily., Disp: , Rfl:  .  fenofibrate (TRICOR) 145 MG tablet, Take 1 tablet by mouth daily, need MD visit for further refills, Disp: 90 tablet, Rfl: 0 .  fluticasone (FLONASE) 50 MCG/ACT nasal spray, Place 2 sprays into the nose daily as needed. Allergic rhinitis., Disp: , Rfl:  .  Glucosamine-Chondroitin (OSTEO BI-FLEX REGULAR STRENGTH PO), Take 1 tablet by mouth 2 (two) times daily., Disp: , Rfl:  .  meloxicam (MOBIC) 15 MG tablet, Take 15 mg by mouth daily., Disp: , Rfl:  .  Multiple Vitamin (MULTIVITAMIN WITH MINERALS) TABS, Take 1 tablet by mouth daily., Disp: , Rfl:  .  Omega 3-6-9 Fatty Acids (GNP TRIPLE OMEGA COMPLEX) CPDR, Take  2 tablets by mouth daily., Disp: , Rfl:  .  simvastatin (ZOCOR) 40 MG tablet, Take 40 mg by mouth daily., Disp: , Rfl:  .  tiZANidine (ZANAFLEX) 2 MG tablet, Take by mouth every 6 (six) hours as needed for muscle spasms., Disp: , Rfl:  .  traMADol (ULTRAM) 50 MG tablet, Take 50 mg by mouth every 12 (twelve) hours as needed., Disp: , Rfl:  .  tretinoin (RETIN-A) 0.05 % cream, Apply 1 application topically 2 (two) times daily., Disp: , Rfl:  .  vitamin A palmitate 10000 UNIT capsule, Take 10,000 Units by mouth daily., Disp: , Rfl:  .  vitamin B-12 (CYANOCOBALAMIN) 1000 MCG tablet, Take 1,000 mcg by mouth daily., Disp: , Rfl:   Allergies  Allergen Reactions  . Other Other  (See Comments)    Anesthesia causes nausea and vomiting, memory difficulty and problems processing.  . Oxycodone Nausea Only  . Sulfa Antibiotics Rash  . Sulfasalazine Rash         Objective:  Physical Exam  General: AAO x3, NAD  Dermatological: Left hallux toenail is ingrown on both medial lateral corners.  There is localized edema and erythema to the distal portion of the toe but there is no drainage or pus coming from the toenail there is no ascending sialitis.  There is no fluctuation or crepitation or any malodor.  The left second digit toenails dystrophic and mildly hypertrophic as well.  No pain in the nail is no surrounding redness or drainage.  This is been a chronic issue for her.  Vascular: Dorsalis Pedis artery and Posterior Tibial artery pedal pulses are 2/4 bilateral with immedate capillary fill time.  There is no pain with calf compression, swelling, warmth, erythema.   Neruologic: Grossly intact via light touch bilateral. Protective threshold with Semmes Wienstein monofilament intact to all pedal sites bilateral.   Musculoskeletal: No gross boney pedal deformities bilateral. No pain, crepitus, or limitation noted with foot and ankle range of motion bilateral. Muscular strength 5/5 in all groups tested bilateral.  Gait: Unassisted, Nonantalgic.       Assessment:   Left hallux ingrown toenail with localized erythema; left second digit onychodystrophy    Plan:  -Treatment options discussed including all alternatives, risks, and complications -Etiology of symptoms were discussed -I discussed the partial nail avulsion with chemical matricectomy in the left foot.  She had a lot of plans this week and she wished to hold off on having this done.  I did go and prescribed Keflex for her and recommend Epson salt soaks as well as antibiotic ointment dressing changes daily.  We will see her back in about a week to check on her and likely do a partial nail avulsion at that time.   She agrees with this plan.  In regard to the left second toe he also damaged toenail and not much we can do for it.  She is okay with this we discussed keep the nail trimmed.  If becomes symptomatic we can remove it permanently but she does not want to have that done.  Vivi Barrack DPM

## 2018-09-17 DIAGNOSIS — M179 Osteoarthritis of knee, unspecified: Secondary | ICD-10-CM | POA: Diagnosis not present

## 2018-09-17 DIAGNOSIS — I129 Hypertensive chronic kidney disease with stage 1 through stage 4 chronic kidney disease, or unspecified chronic kidney disease: Secondary | ICD-10-CM | POA: Diagnosis not present

## 2018-09-17 DIAGNOSIS — N183 Chronic kidney disease, stage 3 (moderate): Secondary | ICD-10-CM | POA: Diagnosis not present

## 2018-09-17 DIAGNOSIS — E785 Hyperlipidemia, unspecified: Secondary | ICD-10-CM | POA: Diagnosis not present

## 2018-09-19 DIAGNOSIS — G4733 Obstructive sleep apnea (adult) (pediatric): Secondary | ICD-10-CM | POA: Diagnosis not present

## 2018-09-21 ENCOUNTER — Ambulatory Visit (INDEPENDENT_AMBULATORY_CARE_PROVIDER_SITE_OTHER): Payer: Medicare Other | Admitting: Podiatry

## 2018-09-21 ENCOUNTER — Encounter: Payer: Self-pay | Admitting: Podiatry

## 2018-09-21 DIAGNOSIS — L600 Ingrowing nail: Secondary | ICD-10-CM

## 2018-09-21 NOTE — Patient Instructions (Signed)

## 2018-09-26 DIAGNOSIS — L600 Ingrowing nail: Secondary | ICD-10-CM | POA: Insufficient documentation

## 2018-09-26 NOTE — Progress Notes (Signed)
Subjective: 77 year old female presents the office today for concerns and follow-up evaluation of continued proton of the left big toe, medial aspect.  She has been soaking in Epsom salts and keep antibiotic ointment and a bandage on the area however has gotten worse and is still painful.  This point she wished to proceed with partial nail avulsion.  She denies any drainage or pus or any red streaks.  Denies any systemic complaints such as fevers, chills, nausea, vomiting. No acute changes since last appointment, and no other complaints at this time.   Objective: AAO x3, NAD DP/PT pulses palpable bilaterally, CRT less than 3 seconds On the medial aspect left hallux toenail there is some granulation tissue present in the nail bed which is new today.  There is no drainage or pus identified there is localized edema but there is no significant erythema, ascending cellulitis.  There is no fluctuation or crepitation or any malodor.  No open lesions or pre-ulcerative lesions.  No pain with calf compression, swelling, warmth, erythema  Assessment: Left medial hallux symptomatic ingrown toenail  Plan: -All treatment options discussed with the patient including all alternatives, risks, complications.  -At this time, the patient is requesting partial nail removal with chemical matricectomy to the symptomatic portion of the nail. Risks and complications were discussed with the patient for which they understand and written consent was obtained. Under sterile conditions a total of 3 mL of a mixture of 2% lidocaine plain and 0.5% Marcaine plain was infiltrated in a hallux block fashion. Once anesthetized, the skin was prepped in sterile fashion. A tourniquet was then applied. Next the medial aspect of hallux nail border was then sharply excised making sure to remove the entire offending nail border. Once the nails were ensured to be removed area was debrided and the underlying skin was intact. There is no purulence  identified in the procedure. Next phenol was then applied under standard conditions and copiously irrigated. Silvadene was applied. A dry sterile dressing was applied. After application of the dressing the tourniquet was removed and there is found to be an immediate capillary refill time to the digit. The patient tolerated the procedure well any complications. Post procedure instructions were discussed the patient for which he verbally understood. Follow-up in one week for nail check or sooner if any problems are to arise. Discussed signs/symptoms of infection and directed to call the office immediately should any occur or go directly to the emergency room. In the meantime, encouraged to call the office with any questions, concerns, changes symptoms. -Patient encouraged to call the office with any questions, concerns, change in symptoms.   Vivi Barrack DPM

## 2018-09-28 ENCOUNTER — Ambulatory Visit (INDEPENDENT_AMBULATORY_CARE_PROVIDER_SITE_OTHER): Payer: Self-pay

## 2018-09-28 DIAGNOSIS — G4733 Obstructive sleep apnea (adult) (pediatric): Secondary | ICD-10-CM | POA: Diagnosis not present

## 2018-09-28 DIAGNOSIS — L600 Ingrowing nail: Secondary | ICD-10-CM

## 2018-09-28 NOTE — Patient Instructions (Signed)

## 2018-10-01 NOTE — Progress Notes (Signed)
Patient is here today for follow-up appointment, procedure performed 09/21/2018, removal of left hallux nail border.  She states that she is not having any issues and denies pain at this time.  Noted well-healing surgical site.  No erythema, no redness, no drainage, no swelling, no other signs and symptoms of infection.  Area is beginning to scab over and it looks like it is healing well.  Advised on signs and symptoms of infection.  Patient was given written and verbal instructions.  She is to follow-up with any acute symptom changes.

## 2018-10-04 ENCOUNTER — Other Ambulatory Visit: Payer: Self-pay | Admitting: Physical Medicine and Rehabilitation

## 2018-10-04 ENCOUNTER — Telehealth: Payer: Self-pay | Admitting: Nurse Practitioner

## 2018-10-04 DIAGNOSIS — M545 Low back pain, unspecified: Secondary | ICD-10-CM

## 2018-10-04 DIAGNOSIS — G8929 Other chronic pain: Secondary | ICD-10-CM

## 2018-10-04 DIAGNOSIS — M5137 Other intervertebral disc degeneration, lumbosacral region: Secondary | ICD-10-CM | POA: Diagnosis not present

## 2018-10-04 DIAGNOSIS — M48061 Spinal stenosis, lumbar region without neurogenic claudication: Secondary | ICD-10-CM | POA: Diagnosis not present

## 2018-10-04 DIAGNOSIS — M7061 Trochanteric bursitis, right hip: Secondary | ICD-10-CM | POA: Diagnosis not present

## 2018-10-04 NOTE — Telephone Encounter (Signed)
Phone call to patient to verify medication list and allergies for myelogram procedure. Pt instructed to hold tramadol 48hrs prior to myelogram appointment time. Pt verbalized understanding. 

## 2018-10-07 DIAGNOSIS — M7061 Trochanteric bursitis, right hip: Secondary | ICD-10-CM | POA: Diagnosis not present

## 2018-10-11 ENCOUNTER — Other Ambulatory Visit: Payer: Self-pay | Admitting: Geriatric Medicine

## 2018-10-11 DIAGNOSIS — G4733 Obstructive sleep apnea (adult) (pediatric): Secondary | ICD-10-CM | POA: Diagnosis not present

## 2018-10-11 DIAGNOSIS — M8580 Other specified disorders of bone density and structure, unspecified site: Secondary | ICD-10-CM

## 2018-10-11 DIAGNOSIS — N183 Chronic kidney disease, stage 3 (moderate): Secondary | ICD-10-CM | POA: Diagnosis not present

## 2018-10-11 DIAGNOSIS — E785 Hyperlipidemia, unspecified: Secondary | ICD-10-CM | POA: Diagnosis not present

## 2018-10-11 DIAGNOSIS — Z0000 Encounter for general adult medical examination without abnormal findings: Secondary | ICD-10-CM | POA: Diagnosis not present

## 2018-10-19 DIAGNOSIS — G4733 Obstructive sleep apnea (adult) (pediatric): Secondary | ICD-10-CM | POA: Diagnosis not present

## 2018-10-20 DIAGNOSIS — G4733 Obstructive sleep apnea (adult) (pediatric): Secondary | ICD-10-CM | POA: Diagnosis not present

## 2018-10-25 NOTE — Discharge Instructions (Signed)

## 2018-10-26 ENCOUNTER — Ambulatory Visit
Admission: RE | Admit: 2018-10-26 | Discharge: 2018-10-26 | Disposition: A | Discharge status: Home / Self Care | Payer: Medicare Other | Source: Ambulatory Visit | Attending: Physical Medicine and Rehabilitation | Admitting: Physical Medicine and Rehabilitation

## 2018-10-26 DIAGNOSIS — M545 Low back pain, unspecified: Secondary | ICD-10-CM

## 2018-10-26 DIAGNOSIS — M48061 Spinal stenosis, lumbar region without neurogenic claudication: Secondary | ICD-10-CM | POA: Diagnosis not present

## 2018-10-26 DIAGNOSIS — G8929 Other chronic pain: Secondary | ICD-10-CM

## 2018-10-26 IMAGING — XA DG MYELOGRAPHY LUMBAR INJ LUMBOSACRAL
6 of 19 series · 6 of 19 positions shown · non-contrast
Comparison: CT myelogram [DATE]. MRI lumbar spine [DATE].

CLINICAL DATA: Low back pain.  Increased buttock and thigh pain.
TECHNIQUE: Contiguous axial images were obtained through the Lumbar spine after
the intrathecal infusion of infusion. Coronal and sagittal
reconstructions were obtained of the axial image sets.

[Series 1: vasc adipose · 1 of 1 slices shown (1 of 3)]
[im 1/1]
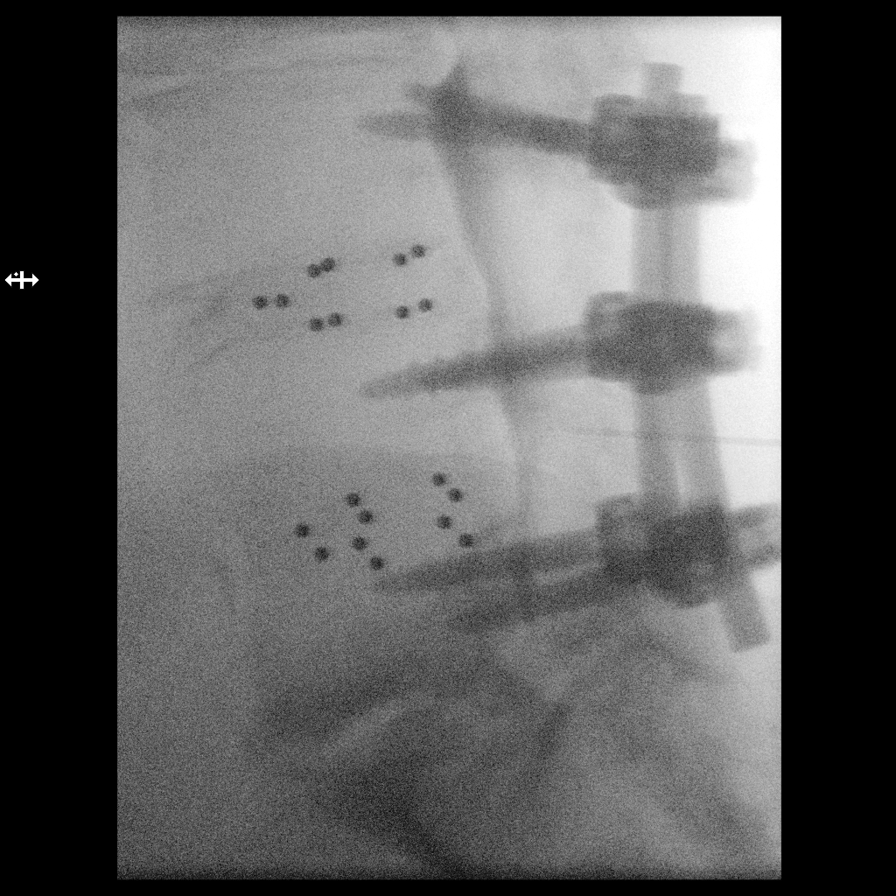

[Series 1: w lumbar spine ap · 0.15mm/px · 1 of 1 slices shown]
[im 1/1]
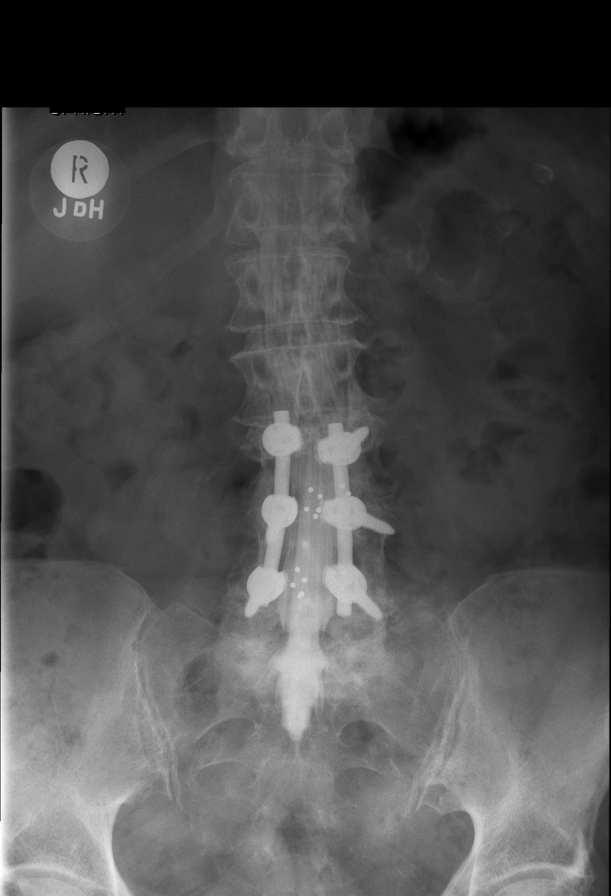

[Series 2: vasc adipose · 1 of 1 slices shown (2 of 3)]
[im 1/1]
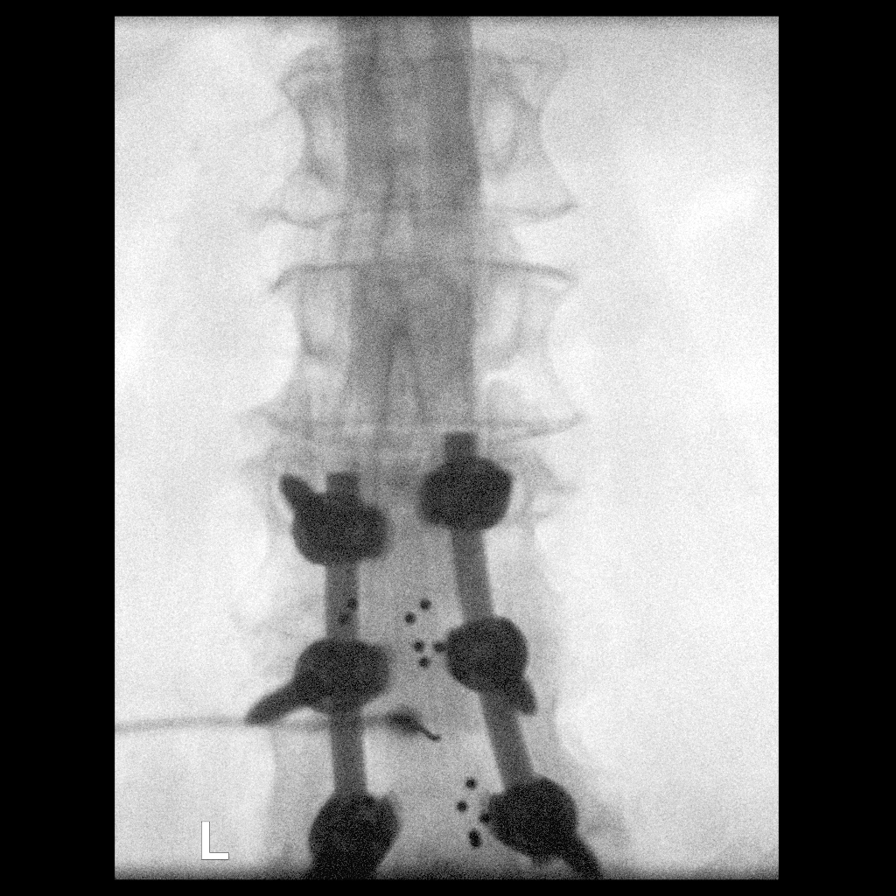

[Series 2: w lumbar spine lat · 0.15mm/px · 1 of 1 slices shown]
[im 1/1]
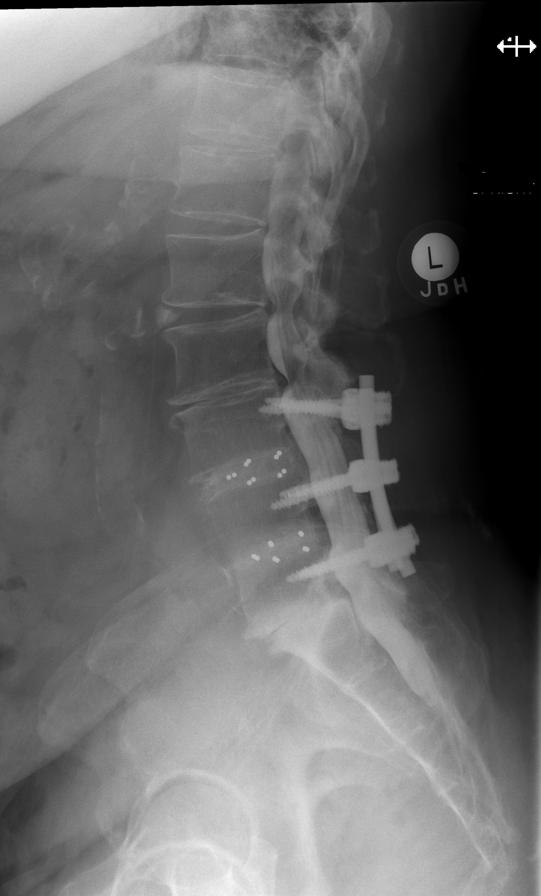

[Series 3: w lumbar spine flexion · 0.15mm/px · 1 of 1 slices shown]
[im 1/1]
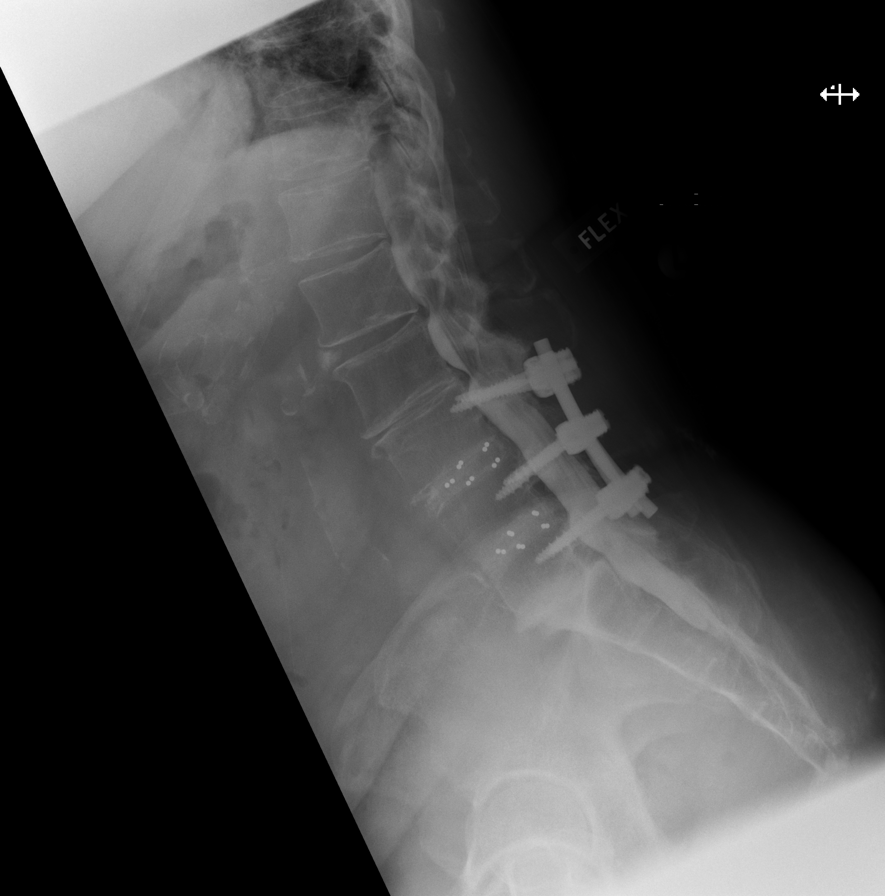

[Series 3: vasc adipose · 1 of 1 slices shown (3 of 3)]
[im 1/1]
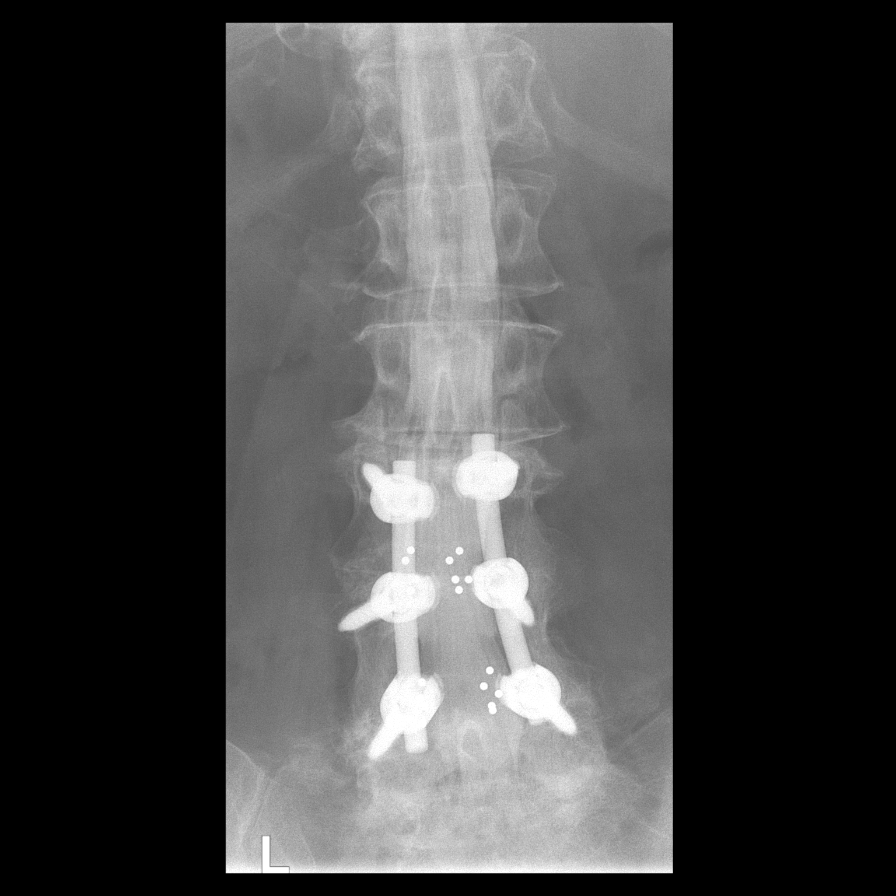

[6 of 19 positions shown; findings below may reference images not displayed]

EXAM:
LUMBAR MYELOGRAM

FLUOROSCOPY TIME:  38 seconds corresponding to a Dose Area Product
of 292.14 Gy*m2

PROCEDURE:
After thorough discussion of risks and benefits of the procedure
including bleeding, infection, injury to nerves, blood vessels,
adjacent structures as well as headache and CSF leak, written and
oral informed consent was obtained. Consent was obtained by Dr. GREFG
GREFG. Time out form was completed.

Patient was positioned prone on the fluoroscopy table. Local
anesthesia was provided with 1% lidocaine without epinephrine after
prepped and draped in the usual sterile fashion. Puncture was
performed at L4-5 using a 3 1/2 inch 20 gauge spinal needle via
midline approach. Using a single pass through the dura, the needle
was placed within the thecal sac, with return of clear CSF. 15 mL of
Isovue [C4] was injected into the thecal sac, with normal
opacification of the nerve roots and cauda equina consistent with
free flow within the subarachnoid space.

I personally performed the lumbar puncture and administered the
intrathecal contrast. I also personally supervised acquisition of
the myelogram images.
FINDINGS: LUMBAR MYELOGRAM FINDINGS:

Good opacification lumbar subarachnoid space. Prior L3-L5 PLIF.
Apparent solid interbody arthrodesis. Hardware intact. No
impingement across the fusion site.

There is stenosis at L5-S1 related to degenerative anterolisthesis
of 4 mm with patient prone. BILATERAL S1 nerve root impingement is
seen, LEFT greater than RIGHT.

Mild stenosis is present at L2-L3, effacement anterior subarachnoid
space, advanced disc space narrowing with large ventral defect.

With patient standing, moderate stenosis is worsened at L2-3 and
mild-to-moderate stenosis develops at L1-2. Ligamentum flavum
infolding is prominent along with shallow ventral defects. Advanced
disc space narrowing at L2-3 is observed.

With patient standing, dynamic instability is documented at L5-S1
along with near complete loss of interspace height. In upright
neutral, anterolisthesis at L5-S1 measures 8 mm. In flexion, this
increases to 9 mm. In extension, the anterolisthesis decreases to 7
mm.

CT LUMBAR MYELOGRAM FINDINGS:

Segmentation: Standard.

Alignment:  4 mm anterolisthesis L5-S1, otherwise anatomic.

Vertebrae: No worrisome osseous lesion.Solid arthrodesis L3-4 and
L4-5.

Conus medullaris: Normal in size and location, terminating L1-L2.

Paraspinal tissues: Unremarkable.  Aortic atherosclerosis.

Disc levels:

L1-L2: Annular bulge. Facet arthropathy. Ligamentum flavum
infolding. Mild stenosis. Mild LEFT L1 and LEFT L2 neural
impingement are possible.

L2-L3: Complete loss of interspace height. Central protrusion with
osseous spurring. Posterior element hypertrophy with facet
arthropathy and ligamentum flavum infolding. Moderate to severe
stenosis. RIGHT greater than LEFT L2 and L3 neural impingement.

L3-L4:  Solid arthrodesis.  No impingement.

L4-L5:  Solid arthrodesis.  No impingement.

L5-S1: 4 mm anterolisthesis. Advanced facet arthropathy. Uncovering
of the disc with shallow protrusion. Vacuum phenomenon within the
interspace. BILATERAL LEFT greater than RIGHT L5 and S1 neural
impingement.

Compared with priors, stenosis at L2-3 is worse. Disc extrusion at
L5-S1 is improved/regressed. Dynamic instability at L5-S1 is
worsened, particularly in standing flexion. Further loss of
interspace height at L2-L3.
IMPRESSION: LUMBAR MYELOGRAM IMPRESSION:

L3-L5 arthrodesis.

Findings of adjacent segment disease at L5-S1 and L2-3. Dynamic
instability is documented at L5-S1, 8 mm slip with patient standing
in flexion.

CT LUMBAR MYELOGRAM IMPRESSION:

Adjacent segment disease at L2-3 has worsened since prior lumbar
myelogram and CT. RIGHT greater than LEFT L2 and L3 neural
impingement are noted along with moderate to severe stenosis,
related to disc space narrowing, bony overgrowth, central
protrusion, and posterior element hypertrophy.

Previously noted in [C4] disc extrusion with cephalad migration at
L5-S1 has regressed, but potentially symptomatic LEFT greater than
RIGHT L5 and S1 neural impingement at the L5-S1 level remains.
Neural impingement at this level would be expected to be worse with
patient standing.

## 2018-10-26 IMAGING — CT CT L SPINE W/ CM
1 of 7 series · 5 of 14 positions shown, 7 images · non-contrast
Comparison: CT myelogram [DATE]. MRI lumbar spine [DATE].

CLINICAL DATA: Low back pain.  Increased buttock and thigh pain.
TECHNIQUE: Contiguous axial images were obtained through the Lumbar spine after
the intrathecal infusion of infusion. Coronal and sagittal
reconstructions were obtained of the axial image sets.

[Series 3: l spine soft · axial · 0.28mm/px · z∈[-312,-180]mm · 5 of 66 slices shown, 7 images]
[im 11/66  soft-tissue]
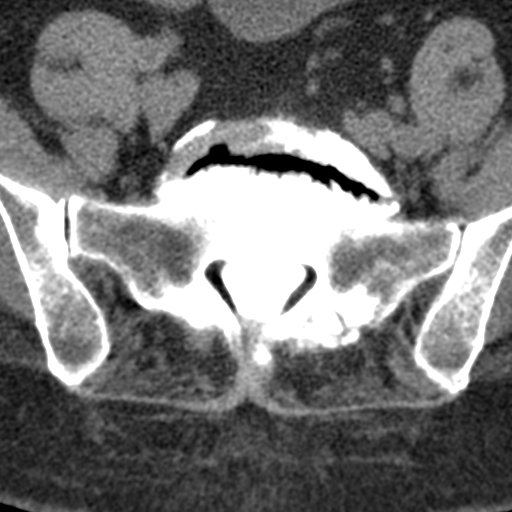
[im 11/66  bone]
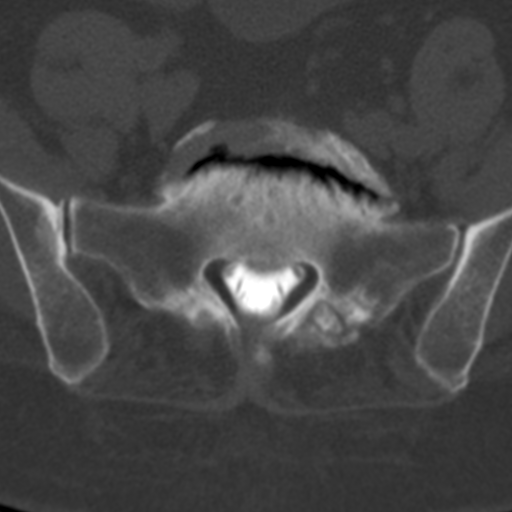
[im 22/66  bone]
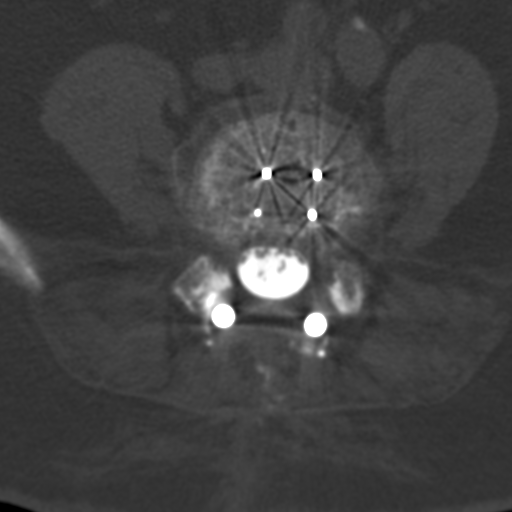
[im 33/66  bone]
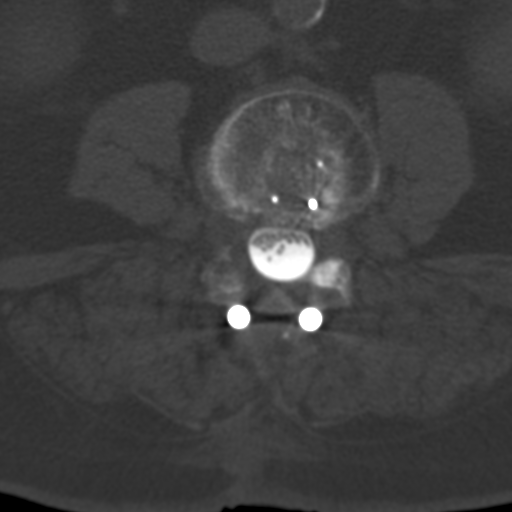
[im 44/66  bone]
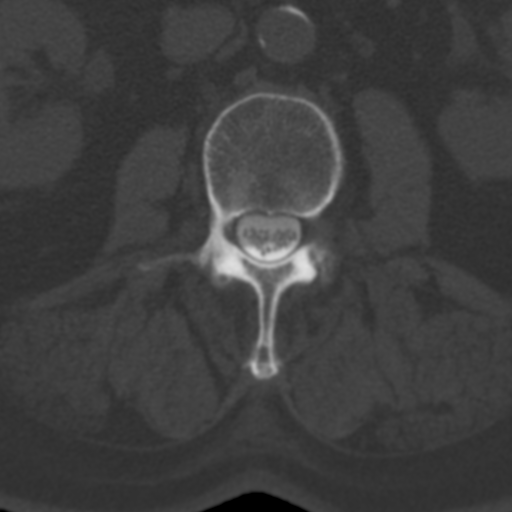
[im 55/66  soft-tissue]
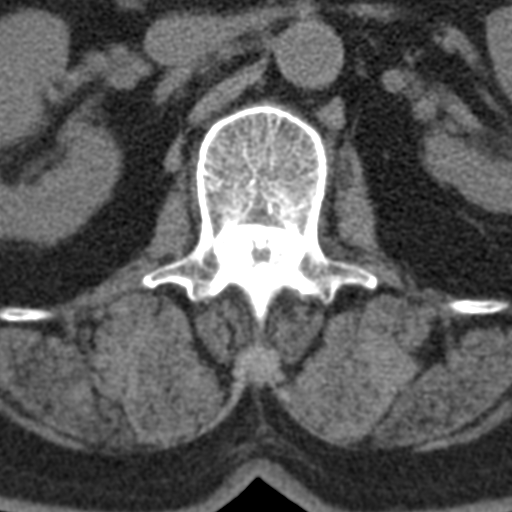
[im 55/66  bone]
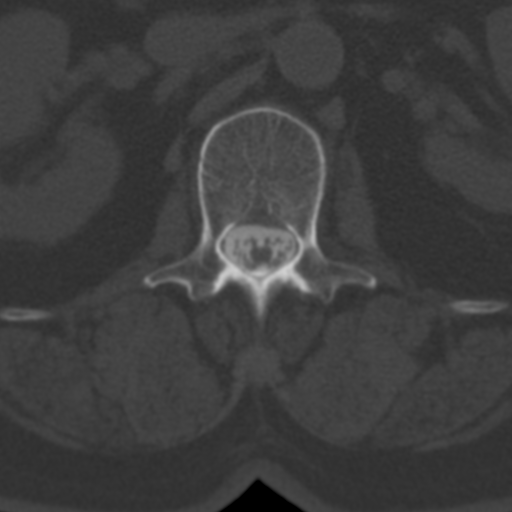

[5 of 14 positions shown; findings below may reference images not displayed]

EXAM:
LUMBAR MYELOGRAM

FLUOROSCOPY TIME:  38 seconds corresponding to a Dose Area Product
of 292.14 Gy*m2

PROCEDURE:
After thorough discussion of risks and benefits of the procedure
including bleeding, infection, injury to nerves, blood vessels,
adjacent structures as well as headache and CSF leak, written and
oral informed consent was obtained. Consent was obtained by Dr. GREFG
GREFG. Time out form was completed.

Patient was positioned prone on the fluoroscopy table. Local
anesthesia was provided with 1% lidocaine without epinephrine after
prepped and draped in the usual sterile fashion. Puncture was
performed at L4-5 using a 3 1/2 inch 20 gauge spinal needle via
midline approach. Using a single pass through the dura, the needle
was placed within the thecal sac, with return of clear CSF. 15 mL of
Isovue [C4] was injected into the thecal sac, with normal
opacification of the nerve roots and cauda equina consistent with
free flow within the subarachnoid space.

I personally performed the lumbar puncture and administered the
intrathecal contrast. I also personally supervised acquisition of
the myelogram images.
FINDINGS: LUMBAR MYELOGRAM FINDINGS:

Good opacification lumbar subarachnoid space. Prior L3-L5 PLIF.
Apparent solid interbody arthrodesis. Hardware intact. No
impingement across the fusion site.

There is stenosis at L5-S1 related to degenerative anterolisthesis
of 4 mm with patient prone. BILATERAL S1 nerve root impingement is
seen, LEFT greater than RIGHT.

Mild stenosis is present at L2-L3, effacement anterior subarachnoid
space, advanced disc space narrowing with large ventral defect.

With patient standing, moderate stenosis is worsened at L2-3 and
mild-to-moderate stenosis develops at L1-2. Ligamentum flavum
infolding is prominent along with shallow ventral defects. Advanced
disc space narrowing at L2-3 is observed.

With patient standing, dynamic instability is documented at L5-S1
along with near complete loss of interspace height. In upright
neutral, anterolisthesis at L5-S1 measures 8 mm. In flexion, this
increases to 9 mm. In extension, the anterolisthesis decreases to 7
mm.

CT LUMBAR MYELOGRAM FINDINGS:

Segmentation: Standard.

Alignment:  4 mm anterolisthesis L5-S1, otherwise anatomic.

Vertebrae: No worrisome osseous lesion.Solid arthrodesis L3-4 and
L4-5.

Conus medullaris: Normal in size and location, terminating L1-L2.

Paraspinal tissues: Unremarkable.  Aortic atherosclerosis.

Disc levels:

L1-L2: Annular bulge. Facet arthropathy. Ligamentum flavum
infolding. Mild stenosis. Mild LEFT L1 and LEFT L2 neural
impingement are possible.

L2-L3: Complete loss of interspace height. Central protrusion with
osseous spurring. Posterior element hypertrophy with facet
arthropathy and ligamentum flavum infolding. Moderate to severe
stenosis. RIGHT greater than LEFT L2 and L3 neural impingement.

L3-L4:  Solid arthrodesis.  No impingement.

L4-L5:  Solid arthrodesis.  No impingement.

L5-S1: 4 mm anterolisthesis. Advanced facet arthropathy. Uncovering
of the disc with shallow protrusion. Vacuum phenomenon within the
interspace. BILATERAL LEFT greater than RIGHT L5 and S1 neural
impingement.

Compared with priors, stenosis at L2-3 is worse. Disc extrusion at
L5-S1 is improved/regressed. Dynamic instability at L5-S1 is
worsened, particularly in standing flexion. Further loss of
interspace height at L2-L3.
IMPRESSION: LUMBAR MYELOGRAM IMPRESSION:

L3-L5 arthrodesis.

Findings of adjacent segment disease at L5-S1 and L2-3. Dynamic
instability is documented at L5-S1, 8 mm slip with patient standing
in flexion.

CT LUMBAR MYELOGRAM IMPRESSION:

Adjacent segment disease at L2-3 has worsened since prior lumbar
myelogram and CT. RIGHT greater than LEFT L2 and L3 neural
impingement are noted along with moderate to severe stenosis,
related to disc space narrowing, bony overgrowth, central
protrusion, and posterior element hypertrophy.

Previously noted in [C4] disc extrusion with cephalad migration at
L5-S1 has regressed, but potentially symptomatic LEFT greater than
RIGHT L5 and S1 neural impingement at the L5-S1 level remains.
Neural impingement at this level would be expected to be worse with
patient standing.

## 2018-10-26 MED ORDER — DIAZEPAM 5 MG PO TABS
5.0000 mg | ORAL_TABLET | Freq: Once | ORAL | Status: AC
  Administered 2018-10-26: 5 mg via ORAL

## 2018-10-26 MED ORDER — IOPAMIDOL (ISOVUE-M 200) INJECTION 41%
20.0000 mL | Freq: Once | INTRAMUSCULAR | Status: AC
  Administered 2018-10-26: 20 mL via INTRATHECAL

## 2018-11-08 DIAGNOSIS — M545 Low back pain: Secondary | ICD-10-CM | POA: Diagnosis not present

## 2018-11-08 DIAGNOSIS — M7061 Trochanteric bursitis, right hip: Secondary | ICD-10-CM | POA: Diagnosis not present

## 2018-11-08 DIAGNOSIS — M5137 Other intervertebral disc degeneration, lumbosacral region: Secondary | ICD-10-CM | POA: Diagnosis not present

## 2018-11-08 DIAGNOSIS — M48061 Spinal stenosis, lumbar region without neurogenic claudication: Secondary | ICD-10-CM | POA: Diagnosis not present

## 2018-11-19 DIAGNOSIS — G4733 Obstructive sleep apnea (adult) (pediatric): Secondary | ICD-10-CM | POA: Diagnosis not present

## 2018-12-20 DIAGNOSIS — H6240 Otitis externa in other diseases classified elsewhere, unspecified ear: Secondary | ICD-10-CM | POA: Diagnosis not present

## 2018-12-20 DIAGNOSIS — B369 Superficial mycosis, unspecified: Secondary | ICD-10-CM | POA: Diagnosis not present

## 2018-12-20 DIAGNOSIS — H1032 Unspecified acute conjunctivitis, left eye: Secondary | ICD-10-CM | POA: Insufficient documentation

## 2018-12-21 ENCOUNTER — Ambulatory Visit
Admission: RE | Admit: 2018-12-21 | Discharge: 2018-12-21 | Disposition: A | Discharge status: Home / Self Care | Payer: Medicare Other | Source: Ambulatory Visit | Attending: Geriatric Medicine | Admitting: Geriatric Medicine

## 2018-12-21 DIAGNOSIS — Z780 Asymptomatic menopausal state: Secondary | ICD-10-CM | POA: Diagnosis not present

## 2018-12-21 DIAGNOSIS — M8580 Other specified disorders of bone density and structure, unspecified site: Secondary | ICD-10-CM

## 2018-12-21 DIAGNOSIS — M85852 Other specified disorders of bone density and structure, left thigh: Secondary | ICD-10-CM | POA: Diagnosis not present

## 2019-01-10 DIAGNOSIS — G4733 Obstructive sleep apnea (adult) (pediatric): Secondary | ICD-10-CM | POA: Diagnosis not present

## 2019-02-07 ENCOUNTER — Ambulatory Visit
Admission: RE | Admit: 2019-02-07 | Discharge: 2019-02-07 | Disposition: A | Discharge status: Home / Self Care | Payer: Medicare Other | Source: Ambulatory Visit | Attending: Geriatric Medicine | Admitting: Geriatric Medicine

## 2019-02-07 ENCOUNTER — Other Ambulatory Visit: Payer: Self-pay | Admitting: Geriatric Medicine

## 2019-02-07 DIAGNOSIS — M5441 Lumbago with sciatica, right side: Secondary | ICD-10-CM | POA: Diagnosis not present

## 2019-02-07 DIAGNOSIS — G8929 Other chronic pain: Secondary | ICD-10-CM | POA: Diagnosis not present

## 2019-02-07 DIAGNOSIS — I129 Hypertensive chronic kidney disease with stage 1 through stage 4 chronic kidney disease, or unspecified chronic kidney disease: Secondary | ICD-10-CM | POA: Diagnosis not present

## 2019-02-07 DIAGNOSIS — R05 Cough: Secondary | ICD-10-CM

## 2019-02-07 DIAGNOSIS — R059 Cough, unspecified: Secondary | ICD-10-CM

## 2019-02-07 DIAGNOSIS — N183 Chronic kidney disease, stage 3 (moderate): Secondary | ICD-10-CM | POA: Diagnosis not present

## 2019-02-07 IMAGING — DX DG CHEST 2V
2 series · 2 of 2 positions shown · non-contrast
Comparison: [DATE]

CLINICAL DATA: Chronic dry cough for a year.

EXAM:
CHEST - 2 VIEW

[dg chest 2 view (1 of 2)]
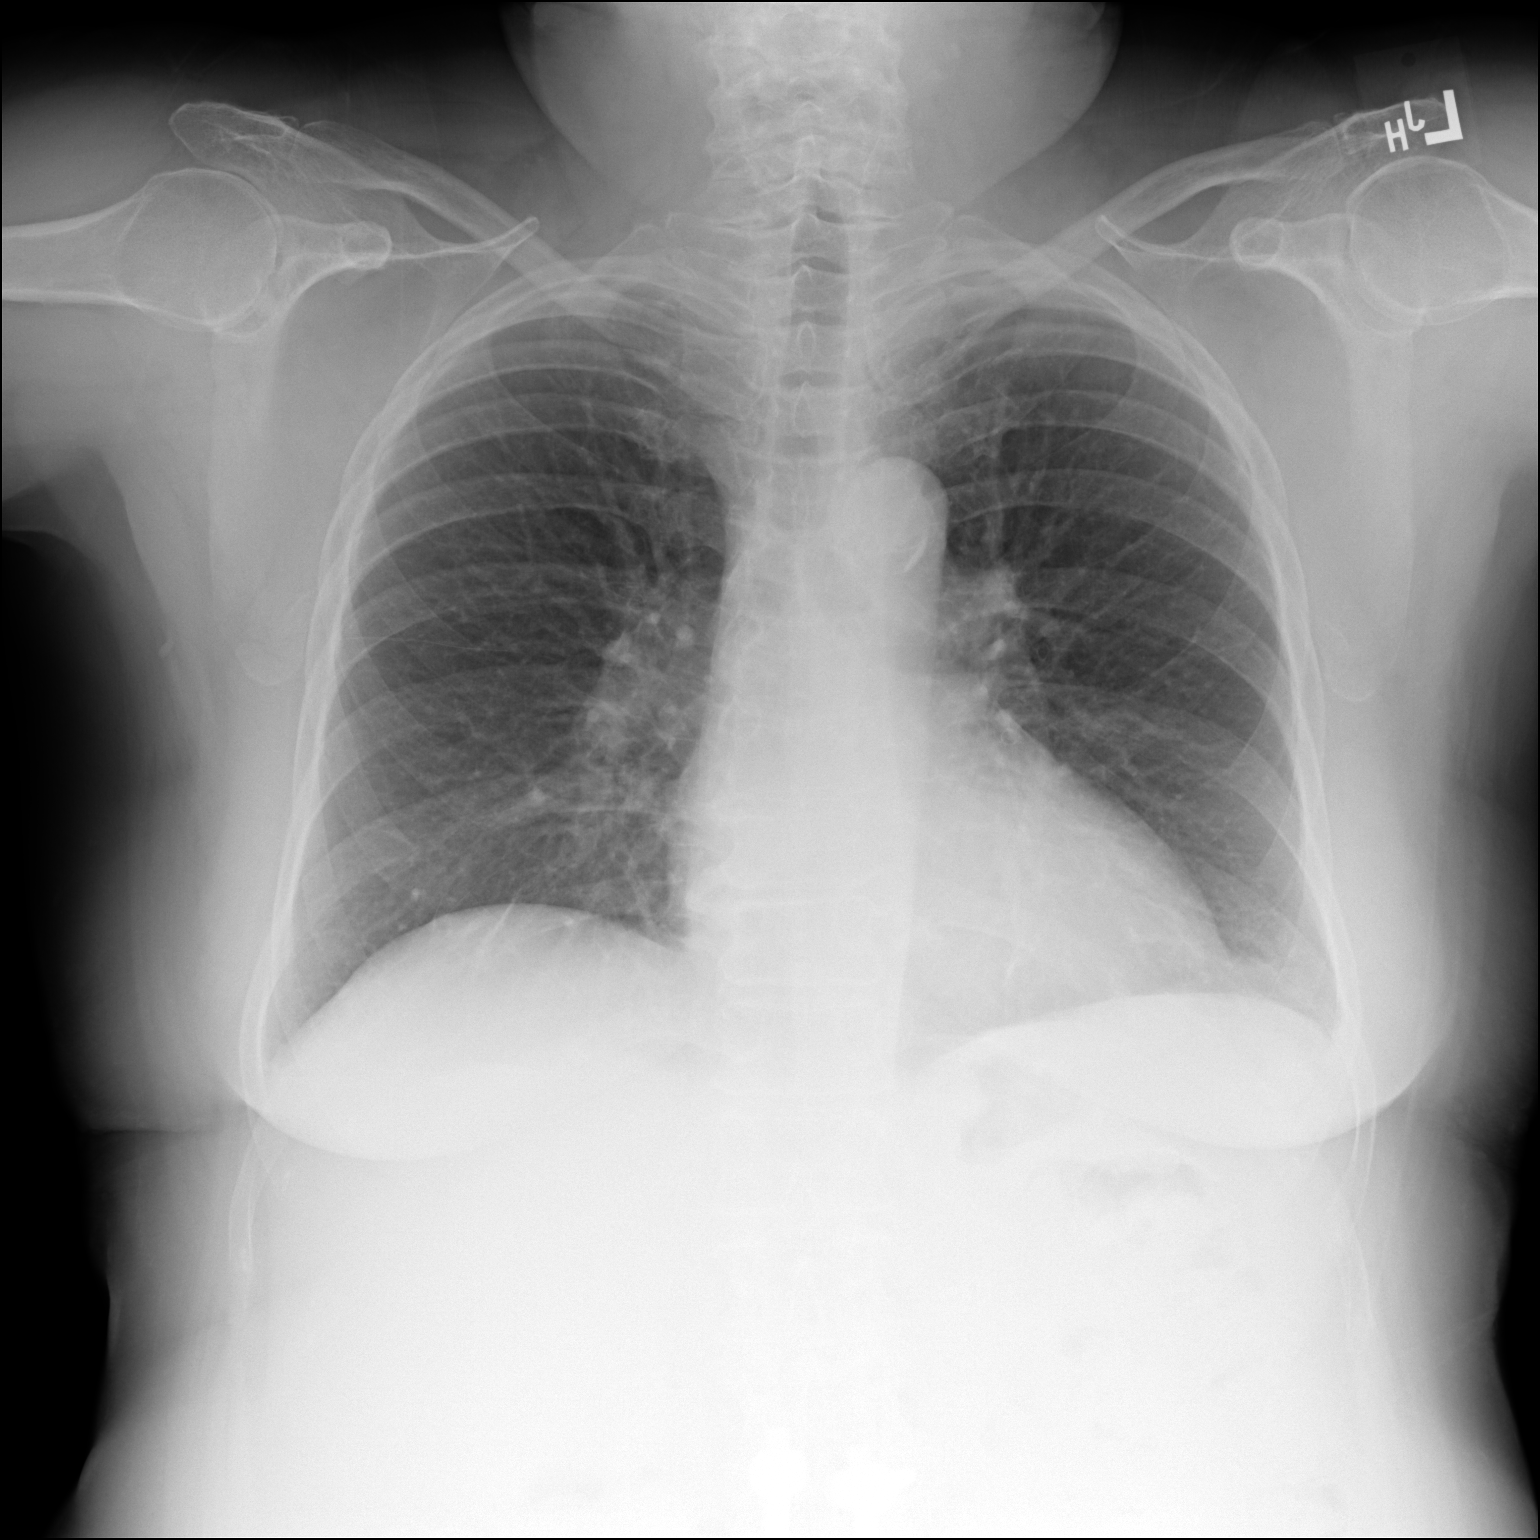

[dg chest 2 view (2 of 2)]
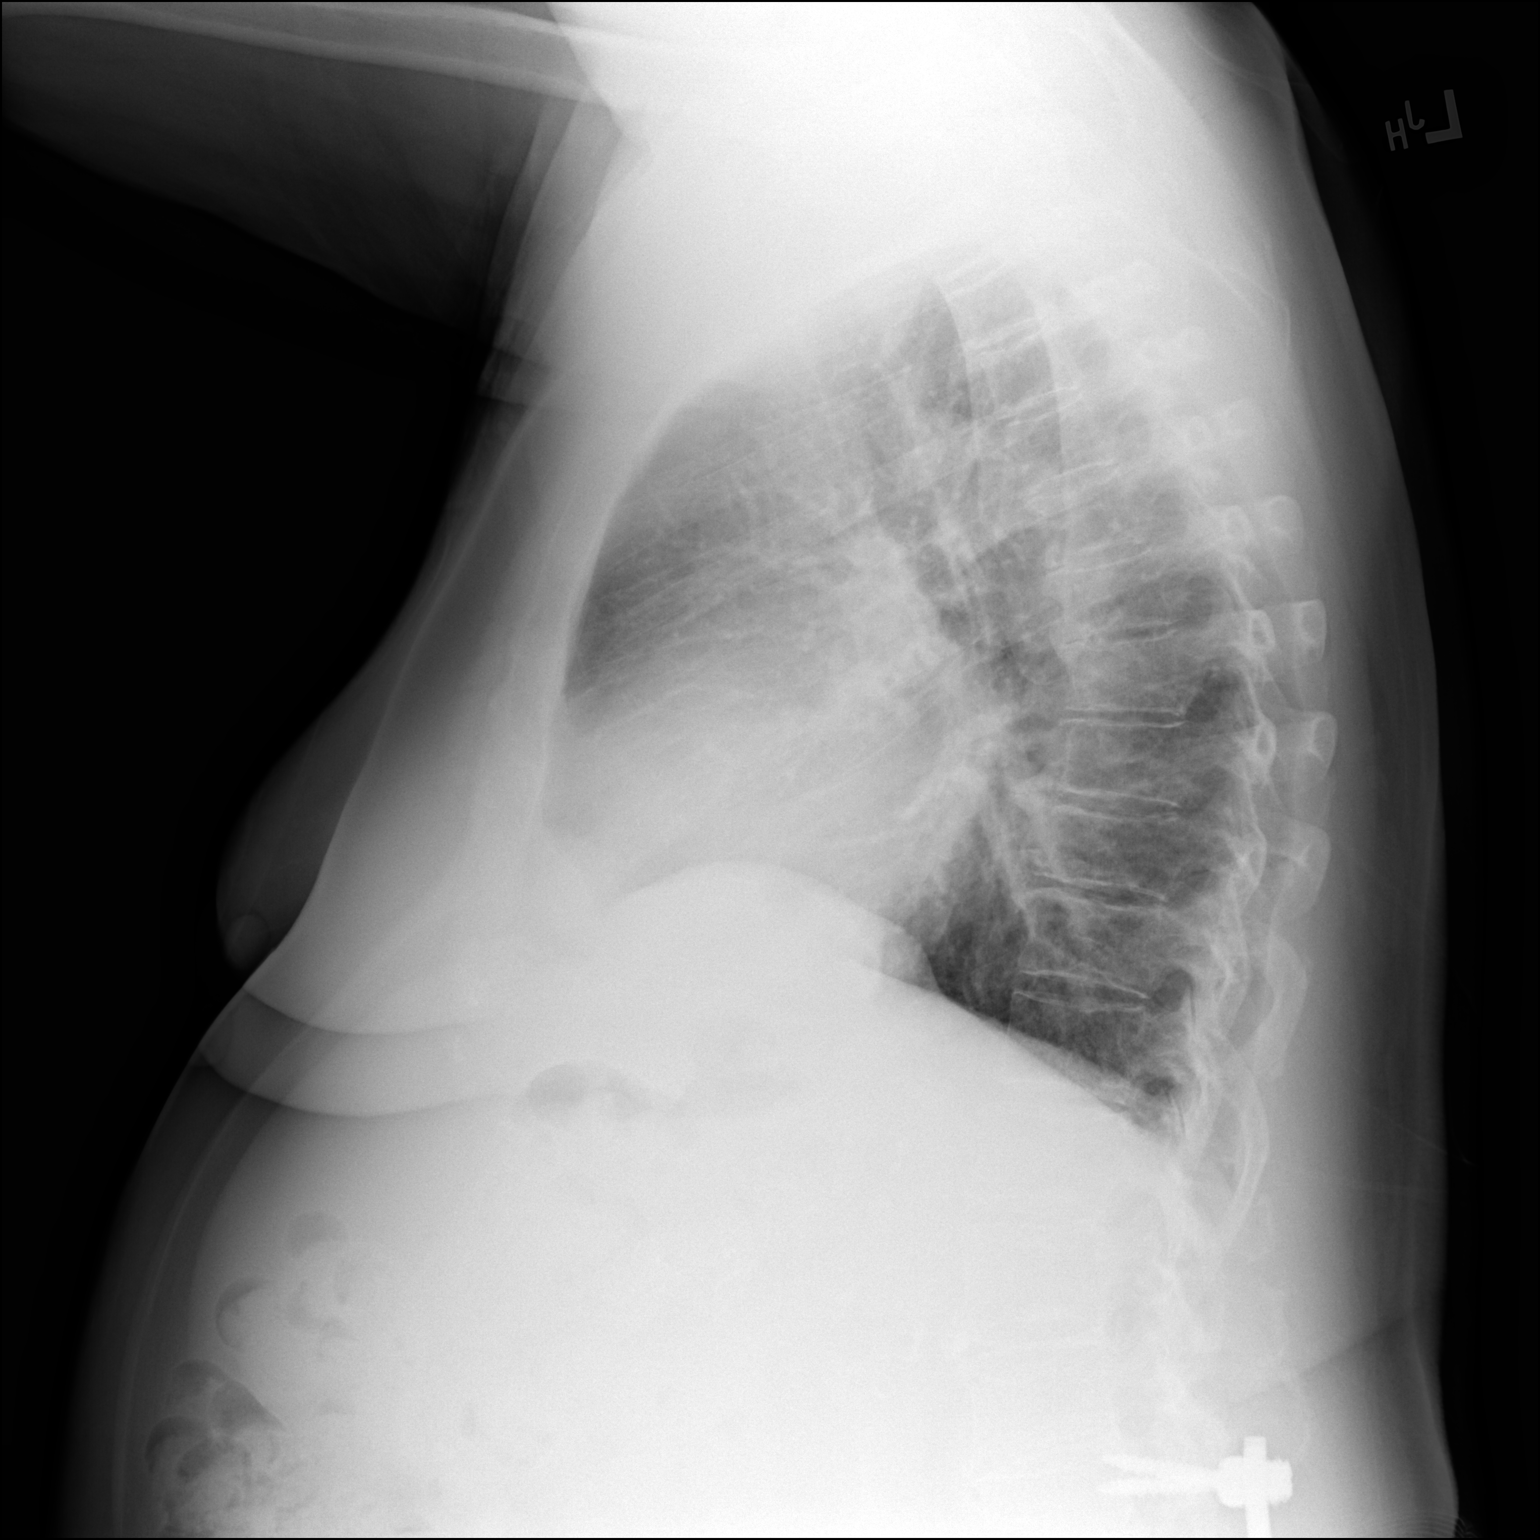

[2 of 2 positions shown; findings below may reference images not displayed]

FINDINGS: Minimal opacity in left base favored represent atelectasis. The
heart, hila, mediastinum, lungs, and pleura are normal.
IMPRESSION: Minimal atelectasis in the left base.  No other abnormalities.

## 2019-02-16 DIAGNOSIS — M5137 Other intervertebral disc degeneration, lumbosacral region: Secondary | ICD-10-CM | POA: Diagnosis not present

## 2019-02-16 DIAGNOSIS — M48061 Spinal stenosis, lumbar region without neurogenic claudication: Secondary | ICD-10-CM | POA: Diagnosis not present

## 2019-02-16 DIAGNOSIS — M545 Low back pain: Secondary | ICD-10-CM | POA: Diagnosis not present

## 2019-02-28 DIAGNOSIS — D225 Melanocytic nevi of trunk: Secondary | ICD-10-CM | POA: Diagnosis not present

## 2019-02-28 DIAGNOSIS — D1801 Hemangioma of skin and subcutaneous tissue: Secondary | ICD-10-CM | POA: Diagnosis not present

## 2019-02-28 DIAGNOSIS — L821 Other seborrheic keratosis: Secondary | ICD-10-CM | POA: Diagnosis not present

## 2019-02-28 DIAGNOSIS — L814 Other melanin hyperpigmentation: Secondary | ICD-10-CM | POA: Diagnosis not present

## 2019-03-28 DIAGNOSIS — G4733 Obstructive sleep apnea (adult) (pediatric): Secondary | ICD-10-CM | POA: Diagnosis not present

## 2019-04-08 DIAGNOSIS — H6240 Otitis externa in other diseases classified elsewhere, unspecified ear: Secondary | ICD-10-CM | POA: Diagnosis not present

## 2019-04-08 DIAGNOSIS — B369 Superficial mycosis, unspecified: Secondary | ICD-10-CM | POA: Diagnosis not present

## 2019-04-13 DIAGNOSIS — G4733 Obstructive sleep apnea (adult) (pediatric): Secondary | ICD-10-CM | POA: Diagnosis not present

## 2019-05-09 DIAGNOSIS — L2089 Other atopic dermatitis: Secondary | ICD-10-CM | POA: Diagnosis not present

## 2019-05-09 DIAGNOSIS — H0102A Squamous blepharitis right eye, upper and lower eyelids: Secondary | ICD-10-CM | POA: Diagnosis not present

## 2019-05-09 DIAGNOSIS — H04123 Dry eye syndrome of bilateral lacrimal glands: Secondary | ICD-10-CM | POA: Diagnosis not present

## 2019-05-09 DIAGNOSIS — H0102B Squamous blepharitis left eye, upper and lower eyelids: Secondary | ICD-10-CM | POA: Diagnosis not present

## 2019-05-18 DIAGNOSIS — M545 Low back pain: Secondary | ICD-10-CM | POA: Diagnosis not present

## 2019-05-18 DIAGNOSIS — M48061 Spinal stenosis, lumbar region without neurogenic claudication: Secondary | ICD-10-CM | POA: Diagnosis not present

## 2019-05-18 DIAGNOSIS — M5137 Other intervertebral disc degeneration, lumbosacral region: Secondary | ICD-10-CM | POA: Diagnosis not present

## 2019-06-07 DIAGNOSIS — M5137 Other intervertebral disc degeneration, lumbosacral region: Secondary | ICD-10-CM | POA: Diagnosis not present

## 2019-06-07 DIAGNOSIS — M48061 Spinal stenosis, lumbar region without neurogenic claudication: Secondary | ICD-10-CM | POA: Diagnosis not present

## 2019-06-07 DIAGNOSIS — M545 Low back pain: Secondary | ICD-10-CM | POA: Diagnosis not present

## 2019-06-15 DIAGNOSIS — M7061 Trochanteric bursitis, right hip: Secondary | ICD-10-CM | POA: Diagnosis not present

## 2019-06-16 DIAGNOSIS — L299 Pruritus, unspecified: Secondary | ICD-10-CM | POA: Insufficient documentation

## 2019-06-16 DIAGNOSIS — H04123 Dry eye syndrome of bilateral lacrimal glands: Secondary | ICD-10-CM | POA: Diagnosis not present

## 2019-06-16 DIAGNOSIS — H0288A Meibomian gland dysfunction right eye, upper and lower eyelids: Secondary | ICD-10-CM | POA: Diagnosis not present

## 2019-06-16 DIAGNOSIS — H0288B Meibomian gland dysfunction left eye, upper and lower eyelids: Secondary | ICD-10-CM | POA: Diagnosis not present

## 2019-06-16 DIAGNOSIS — H0102A Squamous blepharitis right eye, upper and lower eyelids: Secondary | ICD-10-CM | POA: Diagnosis not present

## 2019-06-29 DIAGNOSIS — M5137 Other intervertebral disc degeneration, lumbosacral region: Secondary | ICD-10-CM | POA: Diagnosis not present

## 2019-06-29 DIAGNOSIS — M48061 Spinal stenosis, lumbar region without neurogenic claudication: Secondary | ICD-10-CM | POA: Diagnosis not present

## 2019-06-29 DIAGNOSIS — M545 Low back pain: Secondary | ICD-10-CM | POA: Diagnosis not present

## 2019-06-30 DIAGNOSIS — L2089 Other atopic dermatitis: Secondary | ICD-10-CM | POA: Diagnosis not present

## 2019-06-30 DIAGNOSIS — H0288A Meibomian gland dysfunction right eye, upper and lower eyelids: Secondary | ICD-10-CM | POA: Diagnosis not present

## 2019-06-30 DIAGNOSIS — H0288B Meibomian gland dysfunction left eye, upper and lower eyelids: Secondary | ICD-10-CM | POA: Diagnosis not present

## 2019-06-30 DIAGNOSIS — H04123 Dry eye syndrome of bilateral lacrimal glands: Secondary | ICD-10-CM | POA: Diagnosis not present

## 2019-07-12 DIAGNOSIS — G4733 Obstructive sleep apnea (adult) (pediatric): Secondary | ICD-10-CM | POA: Diagnosis not present

## 2019-07-19 DIAGNOSIS — L299 Pruritus, unspecified: Secondary | ICD-10-CM | POA: Diagnosis not present

## 2019-07-28 DIAGNOSIS — M48061 Spinal stenosis, lumbar region without neurogenic claudication: Secondary | ICD-10-CM | POA: Diagnosis not present

## 2019-07-28 DIAGNOSIS — M7061 Trochanteric bursitis, right hip: Secondary | ICD-10-CM | POA: Diagnosis not present

## 2019-07-28 DIAGNOSIS — M545 Low back pain: Secondary | ICD-10-CM | POA: Diagnosis not present

## 2019-07-28 DIAGNOSIS — M5137 Other intervertebral disc degeneration, lumbosacral region: Secondary | ICD-10-CM | POA: Diagnosis not present

## 2019-08-31 DIAGNOSIS — Z1231 Encounter for screening mammogram for malignant neoplasm of breast: Secondary | ICD-10-CM | POA: Diagnosis not present

## 2019-10-17 DIAGNOSIS — G4733 Obstructive sleep apnea (adult) (pediatric): Secondary | ICD-10-CM | POA: Diagnosis not present

## 2019-10-18 DIAGNOSIS — G4733 Obstructive sleep apnea (adult) (pediatric): Secondary | ICD-10-CM | POA: Diagnosis not present

## 2019-10-25 DIAGNOSIS — G4733 Obstructive sleep apnea (adult) (pediatric): Secondary | ICD-10-CM | POA: Diagnosis not present

## 2019-10-25 DIAGNOSIS — Z0000 Encounter for general adult medical examination without abnormal findings: Secondary | ICD-10-CM | POA: Diagnosis not present

## 2019-10-25 DIAGNOSIS — E785 Hyperlipidemia, unspecified: Secondary | ICD-10-CM | POA: Diagnosis not present

## 2019-10-25 DIAGNOSIS — N1831 Chronic kidney disease, stage 3a: Secondary | ICD-10-CM | POA: Diagnosis not present

## 2019-11-22 DIAGNOSIS — G629 Polyneuropathy, unspecified: Secondary | ICD-10-CM | POA: Diagnosis not present

## 2019-12-08 DIAGNOSIS — I129 Hypertensive chronic kidney disease with stage 1 through stage 4 chronic kidney disease, or unspecified chronic kidney disease: Secondary | ICD-10-CM | POA: Diagnosis not present

## 2019-12-08 DIAGNOSIS — G609 Hereditary and idiopathic neuropathy, unspecified: Secondary | ICD-10-CM | POA: Diagnosis not present

## 2019-12-08 DIAGNOSIS — M5441 Lumbago with sciatica, right side: Secondary | ICD-10-CM | POA: Diagnosis not present

## 2019-12-08 DIAGNOSIS — N1831 Chronic kidney disease, stage 3a: Secondary | ICD-10-CM | POA: Diagnosis not present

## 2019-12-26 DIAGNOSIS — M48061 Spinal stenosis, lumbar region without neurogenic claudication: Secondary | ICD-10-CM | POA: Diagnosis not present

## 2019-12-26 DIAGNOSIS — M25561 Pain in right knee: Secondary | ICD-10-CM | POA: Diagnosis not present

## 2019-12-26 DIAGNOSIS — M25562 Pain in left knee: Secondary | ICD-10-CM | POA: Diagnosis not present

## 2019-12-26 DIAGNOSIS — M542 Cervicalgia: Secondary | ICD-10-CM | POA: Diagnosis not present

## 2020-01-16 DIAGNOSIS — G4733 Obstructive sleep apnea (adult) (pediatric): Secondary | ICD-10-CM | POA: Diagnosis not present

## 2020-01-23 DIAGNOSIS — M545 Low back pain: Secondary | ICD-10-CM | POA: Diagnosis not present

## 2020-01-23 DIAGNOSIS — M25561 Pain in right knee: Secondary | ICD-10-CM | POA: Diagnosis not present

## 2020-01-23 DIAGNOSIS — M25562 Pain in left knee: Secondary | ICD-10-CM | POA: Diagnosis not present

## 2020-02-28 DIAGNOSIS — L814 Other melanin hyperpigmentation: Secondary | ICD-10-CM | POA: Diagnosis not present

## 2020-02-28 DIAGNOSIS — L821 Other seborrheic keratosis: Secondary | ICD-10-CM | POA: Diagnosis not present

## 2020-02-28 DIAGNOSIS — L728 Other follicular cysts of the skin and subcutaneous tissue: Secondary | ICD-10-CM | POA: Diagnosis not present

## 2020-02-28 DIAGNOSIS — D229 Melanocytic nevi, unspecified: Secondary | ICD-10-CM | POA: Diagnosis not present

## 2020-03-15 DIAGNOSIS — M2550 Pain in unspecified joint: Secondary | ICD-10-CM | POA: Diagnosis not present

## 2020-03-15 DIAGNOSIS — M25511 Pain in right shoulder: Secondary | ICD-10-CM | POA: Diagnosis not present

## 2020-03-15 DIAGNOSIS — I129 Hypertensive chronic kidney disease with stage 1 through stage 4 chronic kidney disease, or unspecified chronic kidney disease: Secondary | ICD-10-CM | POA: Diagnosis not present

## 2020-03-15 DIAGNOSIS — N1831 Chronic kidney disease, stage 3a: Secondary | ICD-10-CM | POA: Diagnosis not present

## 2020-03-28 DIAGNOSIS — M542 Cervicalgia: Secondary | ICD-10-CM | POA: Diagnosis not present

## 2020-03-28 DIAGNOSIS — M545 Low back pain: Secondary | ICD-10-CM | POA: Diagnosis not present

## 2020-04-16 DIAGNOSIS — G4733 Obstructive sleep apnea (adult) (pediatric): Secondary | ICD-10-CM | POA: Diagnosis not present

## 2020-04-19 DIAGNOSIS — M545 Low back pain: Secondary | ICD-10-CM | POA: Diagnosis not present

## 2020-04-19 DIAGNOSIS — M542 Cervicalgia: Secondary | ICD-10-CM | POA: Diagnosis not present

## 2020-04-25 DIAGNOSIS — N1831 Chronic kidney disease, stage 3a: Secondary | ICD-10-CM | POA: Diagnosis not present

## 2020-04-25 DIAGNOSIS — I129 Hypertensive chronic kidney disease with stage 1 through stage 4 chronic kidney disease, or unspecified chronic kidney disease: Secondary | ICD-10-CM | POA: Diagnosis not present

## 2020-05-04 DIAGNOSIS — M545 Low back pain: Secondary | ICD-10-CM | POA: Diagnosis not present

## 2020-05-31 DIAGNOSIS — M545 Low back pain: Secondary | ICD-10-CM | POA: Diagnosis not present

## 2020-06-29 DIAGNOSIS — I129 Hypertensive chronic kidney disease with stage 1 through stage 4 chronic kidney disease, or unspecified chronic kidney disease: Secondary | ICD-10-CM | POA: Diagnosis not present

## 2020-06-29 DIAGNOSIS — N1831 Chronic kidney disease, stage 3a: Secondary | ICD-10-CM | POA: Diagnosis not present

## 2020-06-29 DIAGNOSIS — J301 Allergic rhinitis due to pollen: Secondary | ICD-10-CM | POA: Diagnosis not present

## 2020-07-02 DIAGNOSIS — H02834 Dermatochalasis of left upper eyelid: Secondary | ICD-10-CM | POA: Diagnosis not present

## 2020-07-02 DIAGNOSIS — H04123 Dry eye syndrome of bilateral lacrimal glands: Secondary | ICD-10-CM | POA: Diagnosis not present

## 2020-07-02 DIAGNOSIS — H02831 Dermatochalasis of right upper eyelid: Secondary | ICD-10-CM | POA: Diagnosis not present

## 2020-07-02 DIAGNOSIS — H0102A Squamous blepharitis right eye, upper and lower eyelids: Secondary | ICD-10-CM | POA: Diagnosis not present

## 2020-07-16 DIAGNOSIS — G4733 Obstructive sleep apnea (adult) (pediatric): Secondary | ICD-10-CM | POA: Diagnosis not present

## 2020-08-06 DIAGNOSIS — I10 Essential (primary) hypertension: Secondary | ICD-10-CM | POA: Diagnosis present

## 2020-08-07 DIAGNOSIS — H02831 Dermatochalasis of right upper eyelid: Secondary | ICD-10-CM | POA: Diagnosis not present

## 2020-08-07 DIAGNOSIS — H02834 Dermatochalasis of left upper eyelid: Secondary | ICD-10-CM | POA: Diagnosis not present

## 2020-08-07 DIAGNOSIS — H02413 Mechanical ptosis of bilateral eyelids: Secondary | ICD-10-CM | POA: Diagnosis not present

## 2020-08-07 DIAGNOSIS — M1990 Unspecified osteoarthritis, unspecified site: Secondary | ICD-10-CM | POA: Insufficient documentation

## 2020-08-07 DIAGNOSIS — H02423 Myogenic ptosis of bilateral eyelids: Secondary | ICD-10-CM | POA: Diagnosis not present

## 2020-08-08 DIAGNOSIS — H53483 Generalized contraction of visual field, bilateral: Secondary | ICD-10-CM | POA: Diagnosis not present

## 2020-08-20 DIAGNOSIS — H02834 Dermatochalasis of left upper eyelid: Secondary | ICD-10-CM | POA: Diagnosis not present

## 2020-08-20 DIAGNOSIS — H02413 Mechanical ptosis of bilateral eyelids: Secondary | ICD-10-CM | POA: Diagnosis not present

## 2020-08-20 DIAGNOSIS — H02831 Dermatochalasis of right upper eyelid: Secondary | ICD-10-CM | POA: Diagnosis not present

## 2020-08-20 DIAGNOSIS — H02423 Myogenic ptosis of bilateral eyelids: Secondary | ICD-10-CM | POA: Diagnosis not present

## 2020-08-30 DIAGNOSIS — N1831 Chronic kidney disease, stage 3a: Secondary | ICD-10-CM | POA: Diagnosis not present

## 2020-08-30 DIAGNOSIS — G8929 Other chronic pain: Secondary | ICD-10-CM | POA: Diagnosis not present

## 2020-08-30 DIAGNOSIS — M25511 Pain in right shoulder: Secondary | ICD-10-CM | POA: Diagnosis not present

## 2020-08-30 DIAGNOSIS — I129 Hypertensive chronic kidney disease with stage 1 through stage 4 chronic kidney disease, or unspecified chronic kidney disease: Secondary | ICD-10-CM | POA: Diagnosis not present

## 2020-09-05 DIAGNOSIS — Z1231 Encounter for screening mammogram for malignant neoplasm of breast: Secondary | ICD-10-CM | POA: Diagnosis not present

## 2020-09-12 DIAGNOSIS — M47816 Spondylosis without myelopathy or radiculopathy, lumbar region: Secondary | ICD-10-CM | POA: Diagnosis not present

## 2020-09-12 DIAGNOSIS — M545 Low back pain: Secondary | ICD-10-CM | POA: Diagnosis not present

## 2020-10-16 DIAGNOSIS — G4733 Obstructive sleep apnea (adult) (pediatric): Secondary | ICD-10-CM | POA: Diagnosis not present

## 2020-10-18 DIAGNOSIS — M545 Low back pain, unspecified: Secondary | ICD-10-CM | POA: Diagnosis not present

## 2020-10-18 DIAGNOSIS — M542 Cervicalgia: Secondary | ICD-10-CM | POA: Diagnosis not present

## 2020-10-18 DIAGNOSIS — M47816 Spondylosis without myelopathy or radiculopathy, lumbar region: Secondary | ICD-10-CM | POA: Diagnosis not present

## 2020-10-18 DIAGNOSIS — M48061 Spinal stenosis, lumbar region without neurogenic claudication: Secondary | ICD-10-CM | POA: Diagnosis not present

## 2020-10-23 DIAGNOSIS — G4733 Obstructive sleep apnea (adult) (pediatric): Secondary | ICD-10-CM | POA: Diagnosis not present

## 2020-10-26 DIAGNOSIS — M47816 Spondylosis without myelopathy or radiculopathy, lumbar region: Secondary | ICD-10-CM | POA: Diagnosis not present

## 2020-11-06 ENCOUNTER — Other Ambulatory Visit: Payer: Self-pay

## 2020-11-06 ENCOUNTER — Emergency Department: Payer: Medicare Other

## 2020-11-06 ENCOUNTER — Observation Stay
Admission: EM | Admit: 2020-11-06 | Discharge: 2020-11-07 | Disposition: A | Discharge status: Home / Self Care | Payer: Medicare Other | Attending: Internal Medicine | Admitting: Internal Medicine

## 2020-11-06 DIAGNOSIS — J45909 Unspecified asthma, uncomplicated: Secondary | ICD-10-CM | POA: Insufficient documentation

## 2020-11-06 DIAGNOSIS — N183 Chronic kidney disease, stage 3 unspecified: Secondary | ICD-10-CM | POA: Diagnosis not present

## 2020-11-06 DIAGNOSIS — E785 Hyperlipidemia, unspecified: Secondary | ICD-10-CM

## 2020-11-06 DIAGNOSIS — R4701 Aphasia: Secondary | ICD-10-CM | POA: Diagnosis present

## 2020-11-06 DIAGNOSIS — I6381 Other cerebral infarction due to occlusion or stenosis of small artery: Secondary | ICD-10-CM

## 2020-11-06 DIAGNOSIS — I129 Hypertensive chronic kidney disease with stage 1 through stage 4 chronic kidney disease, or unspecified chronic kidney disease: Secondary | ICD-10-CM | POA: Diagnosis not present

## 2020-11-06 DIAGNOSIS — R29810 Facial weakness: Secondary | ICD-10-CM

## 2020-11-06 DIAGNOSIS — I10 Essential (primary) hypertension: Secondary | ICD-10-CM | POA: Diagnosis present

## 2020-11-06 DIAGNOSIS — Z20822 Contact with and (suspected) exposure to covid-19: Secondary | ICD-10-CM | POA: Insufficient documentation

## 2020-11-06 DIAGNOSIS — N189 Chronic kidney disease, unspecified: Secondary | ICD-10-CM | POA: Insufficient documentation

## 2020-11-06 DIAGNOSIS — R4781 Slurred speech: Secondary | ICD-10-CM | POA: Diagnosis not present

## 2020-11-06 DIAGNOSIS — I639 Cerebral infarction, unspecified: Secondary | ICD-10-CM | POA: Diagnosis not present

## 2020-11-06 DIAGNOSIS — R531 Weakness: Secondary | ICD-10-CM | POA: Diagnosis not present

## 2020-11-06 DIAGNOSIS — I672 Cerebral atherosclerosis: Secondary | ICD-10-CM | POA: Diagnosis not present

## 2020-11-06 DIAGNOSIS — R0902 Hypoxemia: Secondary | ICD-10-CM | POA: Diagnosis not present

## 2020-11-06 LAB — APTT: aPTT: 26 seconds (ref 24–36)

## 2020-11-06 LAB — URINALYSIS, ROUTINE W REFLEX MICROSCOPIC
Bacteria, UA: NONE SEEN
Bilirubin Urine: NEGATIVE
Glucose, UA: NEGATIVE mg/dL
Hgb urine dipstick: NEGATIVE
Ketones, ur: NEGATIVE mg/dL
Leukocytes,Ua: NEGATIVE
Nitrite: NEGATIVE
Protein, ur: NEGATIVE mg/dL
Specific Gravity, Urine: 1.015 (ref 1.005–1.030)
pH: 6 (ref 5.0–8.0)

## 2020-11-06 LAB — DIFFERENTIAL
Abs Immature Granulocytes: 0.02 10*3/uL (ref 0.00–0.07)
Basophils Absolute: 0 10*3/uL (ref 0.0–0.1)
Basophils Relative: 1 %
Eosinophils Absolute: 0.2 10*3/uL (ref 0.0–0.5)
Eosinophils Relative: 5 %
Immature Granulocytes: 0 %
Lymphocytes Relative: 27 %
Lymphs Abs: 1.4 10*3/uL (ref 0.7–4.0)
Monocytes Absolute: 0.5 10*3/uL (ref 0.1–1.0)
Monocytes Relative: 10 %
Neutro Abs: 3.1 10*3/uL (ref 1.7–7.7)
Neutrophils Relative %: 57 %

## 2020-11-06 LAB — URINE DRUG SCREEN, QUALITATIVE (ARMC ONLY)
Amphetamines, Ur Screen: NOT DETECTED
Barbiturates, Ur Screen: NOT DETECTED
Benzodiazepine, Ur Scrn: NOT DETECTED
Cannabinoid 50 Ng, Ur ~~LOC~~: NOT DETECTED
Cocaine Metabolite,Ur ~~LOC~~: NOT DETECTED
MDMA (Ecstasy)Ur Screen: POSITIVE — AB
Methadone Scn, Ur: NOT DETECTED
Opiate, Ur Screen: NOT DETECTED
Phencyclidine (PCP) Ur S: NOT DETECTED
Tricyclic, Ur Screen: NOT DETECTED

## 2020-11-06 LAB — CBC
HCT: 37.2 % (ref 36.0–46.0)
Hemoglobin: 12.4 g/dL (ref 12.0–15.0)
MCH: 29.5 pg (ref 26.0–34.0)
MCHC: 33.3 g/dL (ref 30.0–36.0)
MCV: 88.6 fL (ref 80.0–100.0)
Platelets: 255 10*3/uL (ref 150–400)
RBC: 4.2 MIL/uL (ref 3.87–5.11)
RDW: 13.2 % (ref 11.5–15.5)
WBC: 5.3 10*3/uL (ref 4.0–10.5)
nRBC: 0 % (ref 0.0–0.2)

## 2020-11-06 LAB — COMPREHENSIVE METABOLIC PANEL
ALT: 22 U/L (ref 0–44)
AST: 29 U/L (ref 15–41)
Albumin: 4.2 g/dL (ref 3.5–5.0)
Alkaline Phosphatase: 24 U/L — ABNORMAL LOW (ref 38–126)
Anion gap: 11 (ref 5–15)
BUN: 25 mg/dL — ABNORMAL HIGH (ref 8–23)
CO2: 24 mmol/L (ref 22–32)
Calcium: 9.4 mg/dL (ref 8.9–10.3)
Chloride: 104 mmol/L (ref 98–111)
Creatinine, Ser: 1.25 mg/dL — ABNORMAL HIGH (ref 0.44–1.00)
GFR, Estimated: 44 mL/min — ABNORMAL LOW (ref >60)
Glucose, Bld: 137 mg/dL — ABNORMAL HIGH (ref 70–99)
Potassium: 4.1 mmol/L (ref 3.5–5.1)
Sodium: 139 mmol/L (ref 135–145)
Total Bilirubin: 0.6 mg/dL (ref 0.3–1.2)
Total Protein: 8 g/dL (ref 6.5–8.1)

## 2020-11-06 LAB — RESPIRATORY PANEL BY RT PCR (FLU A&B, COVID)
Influenza A by PCR: NEGATIVE
Influenza B by PCR: NEGATIVE
SARS Coronavirus 2 by RT PCR: NEGATIVE

## 2020-11-06 LAB — PROTIME-INR
INR: 1 (ref 0.8–1.2)
Prothrombin Time: 12.7 seconds (ref 11.4–15.2)

## 2020-11-06 IMAGING — CT CT HEAD CODE STROKE
3 series · 14 of 47 positions shown, 16 images · non-contrast
Comparison: None.

CLINICAL DATA: Code stroke. New onset of slurred speech and
right-sided facial droop beginning this morning. Unknown last seen
well.

EXAM:
CT HEAD WITHOUT CONTRAST
TECHNIQUE: Contiguous axial images were obtained from the base of the skull
through the vertex without intravenous contrast.

[Series 2: head wo · axial · 0.40mm/px · z∈[-100,+25]mm · 8 of 30 slices shown, 10 images]
[im 3/30  brain]
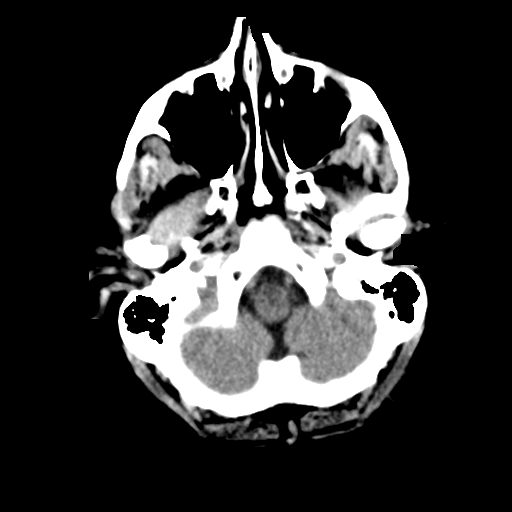
[im 3/30  bone]
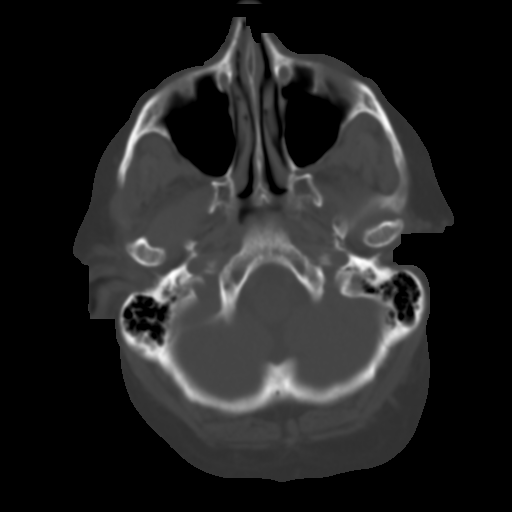
[im 7/30  brain]
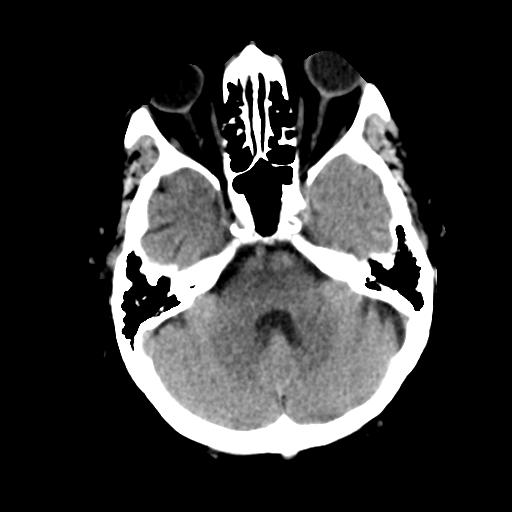
[im 10/30  brain]
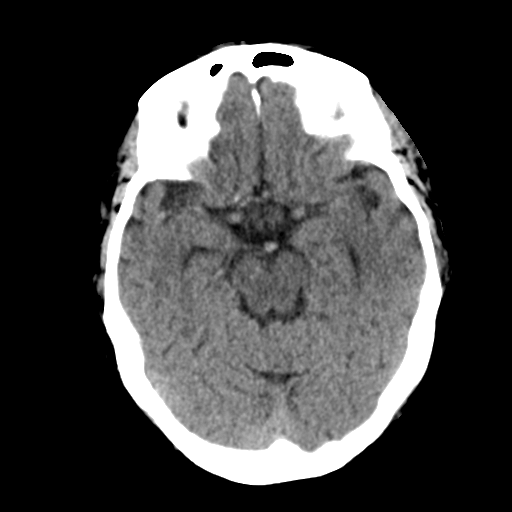
[im 14/30  brain]
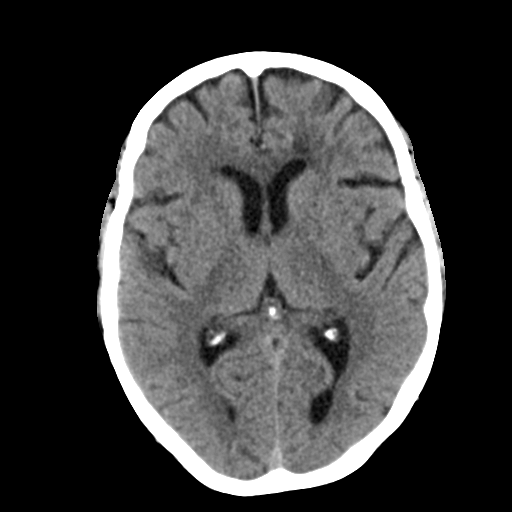
[im 17/30  brain]
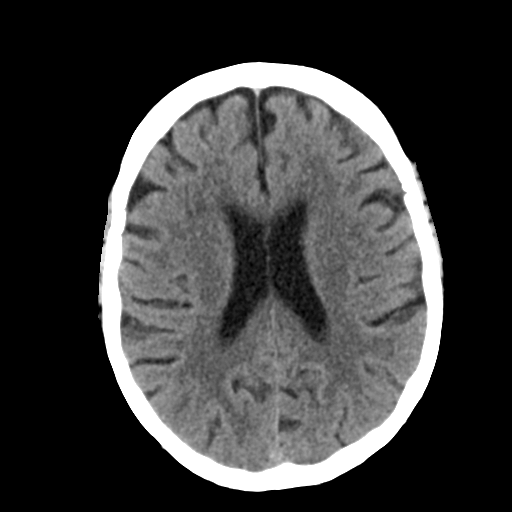
[im 17/30  bone]
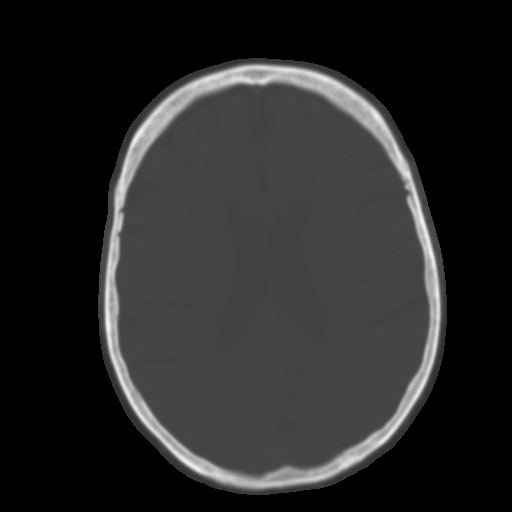
[im 21/30  brain]
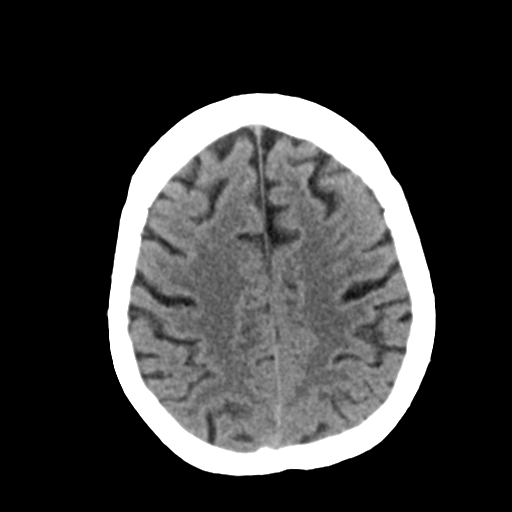
[im 24/30  brain]
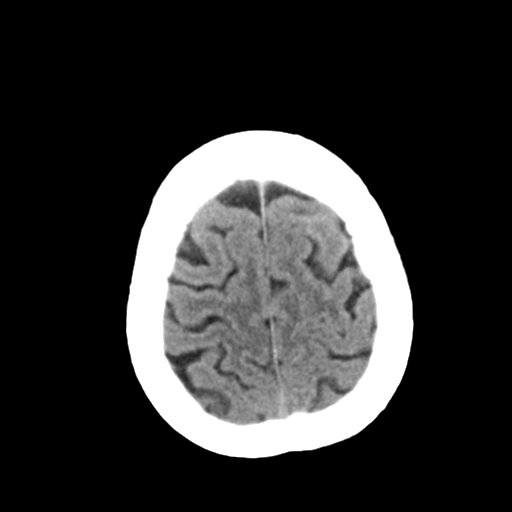
[im 28/30  brain]
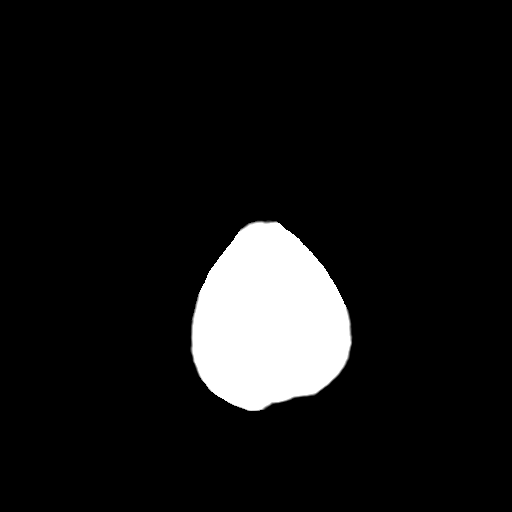

[Series 4: coronal soft tissue · coronal · 0.28mm/px · 3 of 68 slices shown]
[im 24/68  brain]
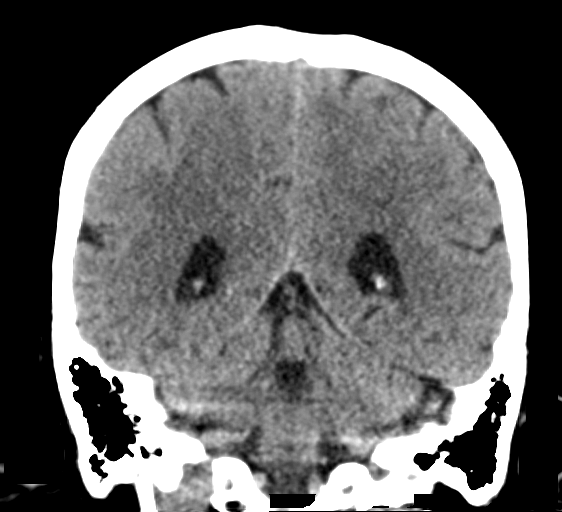
[im 31/68  brain]
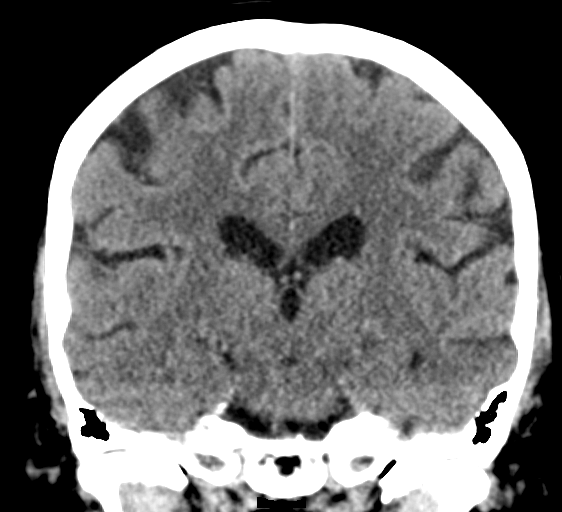
[im 38/68  brain]
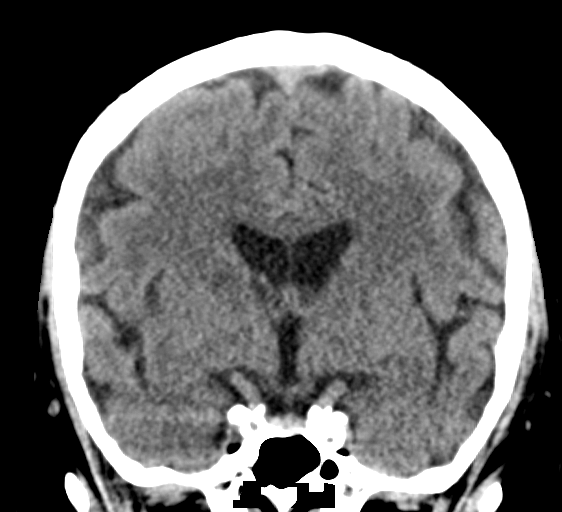

[Series 5: sagittal soft tissue · sagittal · 0.28mm/px · 3 of 59 slices shown]
[im 20/59  brain]
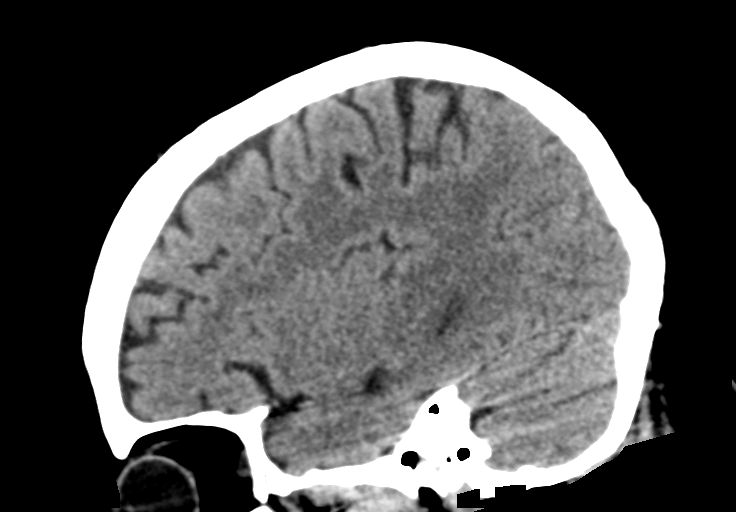
[im 30/59  brain]
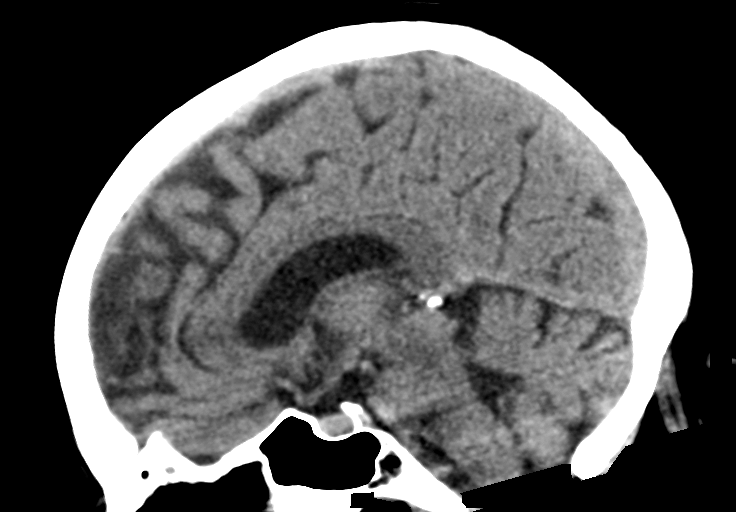
[im 39/59  brain]
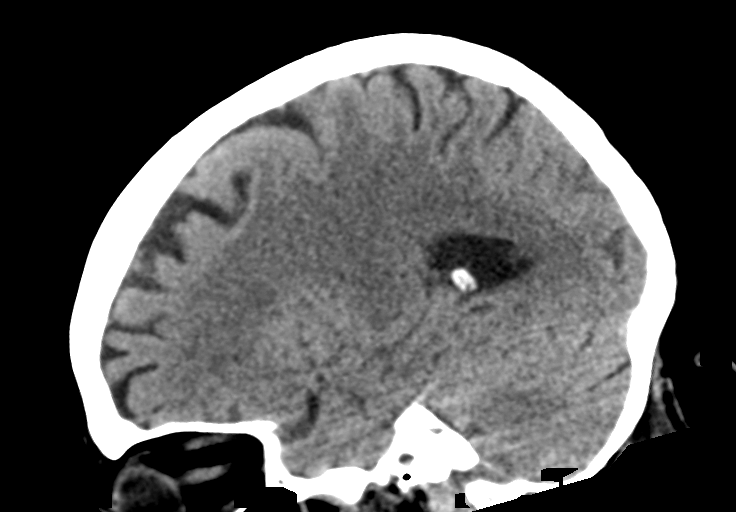

[14 of 47 positions shown; findings below may reference images not displayed]

FINDINGS: Brain: Scattered subcortical white matter hypoattenuation is most
evident adjacent to the basal ganglia and in the operculum
bilaterally. No acute cortical abnormality is present. Insular
ribbon is normal bilaterally. Basal ganglia are normal. No acute
hemorrhage or mass lesion is present. The ventricles are of normal
size. No significant extraaxial fluid collection is present. The
brainstem and cerebellum are within normal limits.

Vascular: Atherosclerotic calcifications are present within the
cavernous internal carotid arteries bilaterally and at the V3
segments of the vertebral arteries. No hyperdense vessel is present.

Skull: Calvarium is intact. No focal lytic or blastic lesions are
present. No significant extracranial soft tissue lesion is present.

Sinuses/Orbits: The paranasal sinuses and mastoid air cells are
clear. Bilateral lens replacements are noted. Globes and orbits are
otherwise unremarkable.

ASPECTS ([place] Stroke Program Early CT Score)

- Ganglionic level infarction (caudate, lentiform nuclei, internal
capsule, insula, M1-M3 cortex): [DATE]

- Supraganglionic infarction (M4-M6 cortex): [DATE]

Total score (0-10 with 10 being normal): [DATE]
IMPRESSION: 1. Scattered subcortical white matter hypoattenuation is most
evident adjacent to the basal ganglia and operculum bilaterally.
This likely reflects the sequela of chronic microvascular ischemia.
2. No acute cortical abnormality.
3. ASPECTS is [DATE].

The above was relayed via text pager to Dr. AKILAH on [DATE] at

## 2020-11-06 IMAGING — MR MR MRA NECK WO/W CM
4 of 5 series · 22 of 48 positions shown · IV contrast (gadavist)
Comparison: Brain MRI today reported separately.

CLINICAL DATA: 79-year-old female code stroke presentation this
morning. Slurred speech and right facial droop.

EXAM:
MRA HEAD WITHOUT CONTRAST
MRA NECK WITHOUT AND WITH CONTRAST
TECHNIQUE: Angiographic images of the Circle of Willis were obtained using MRA
technique without intravenous contrast. Angiographic images of the
neck were obtained using MRA technique without and with intravenous
contrast. Carotid stenosis measurements (when applicable) are
obtained utilizing NASCET criteria, using the distal internal
carotid diameter as the denominator.
CONTRAST:  8mL GADAVIST GADOBUTROL 1 MMOL/ML IV SOLN

[Series 14: angio_fl3d_cor_post_ttc=2.0s · coronal · 0.9mm · 0.85mm/px · 19 of 96 slices shown]
[im 1/96]
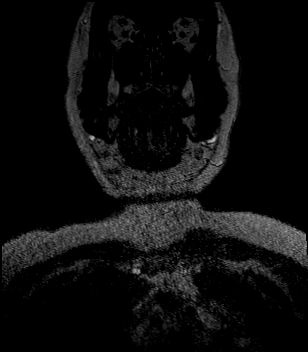
[im 6/96]
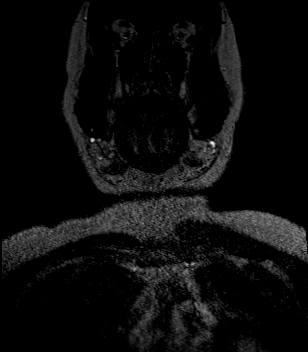
[im 11/96]
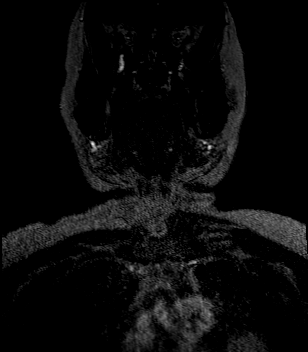
[im 16/96]
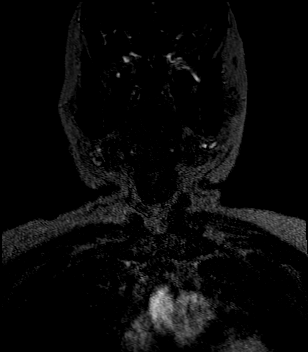
[im 22/96]
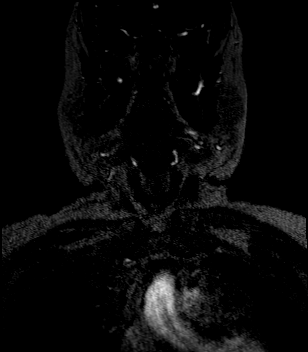
[im 27/96]
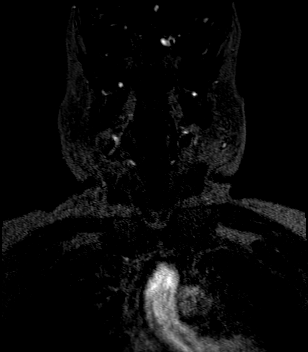
[im 32/96]
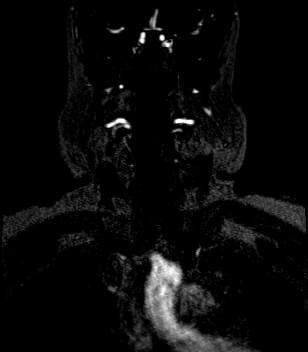
[im 37/96]
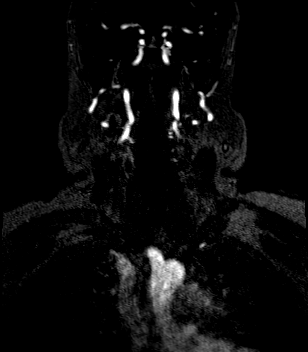
[im 43/96]
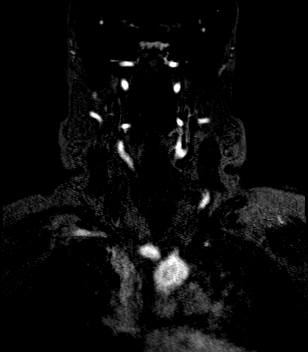
[im 48/96]
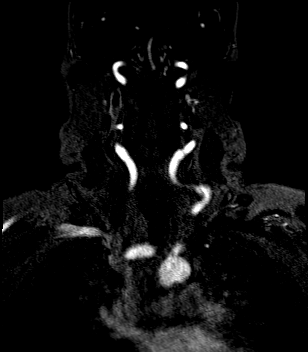
[im 53/96]
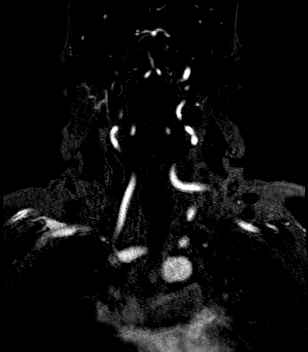
[im 59/96]
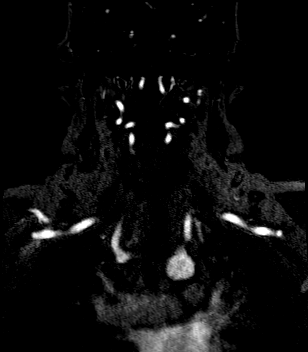
[im 64/96]
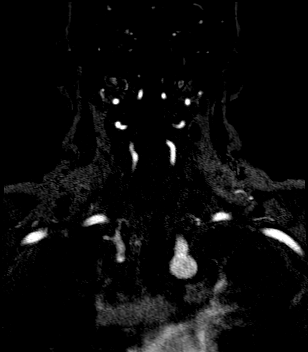
[im 69/96]
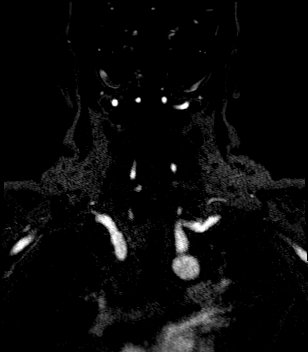
[im 74/96]
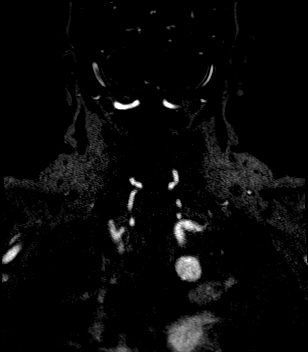
[im 80/96]
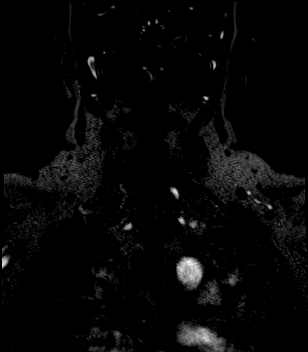
[im 85/96]
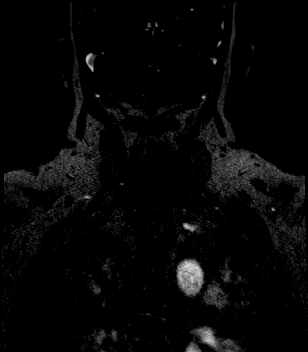
[im 90/96]
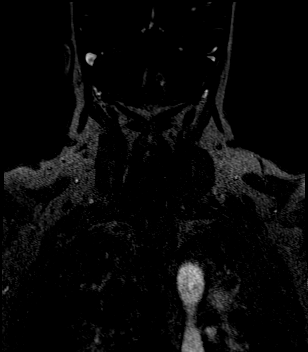
[im 96/96]
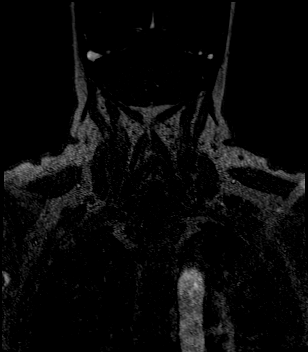

[Series 1098: rcca · axial · 0.9mm · 0.68mm/px · 1 of 2 slices shown]
[im 1/2]
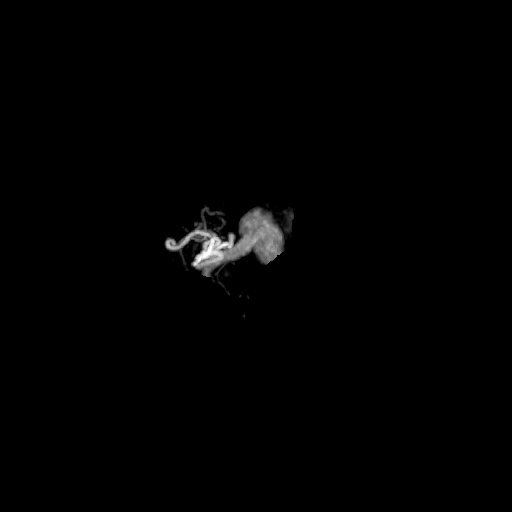

[Series 1105: lcca · axial · 0.9mm · 0.68mm/px · 1 of 2 slices shown]
[im 1/2]
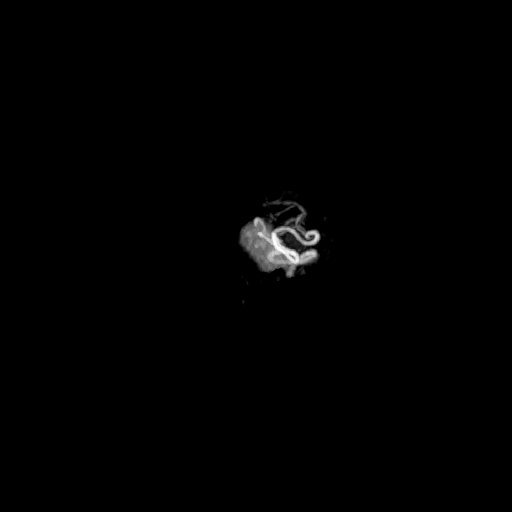

[Series 1112: anterior · axial · 0.9mm · 0.68mm/px · 1 of 2 slices shown]
[im 1/2]
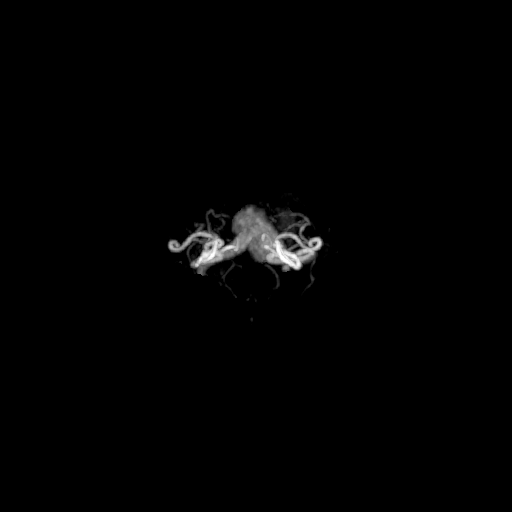

[22 of 48 positions shown; findings below may reference images not displayed]

FINDINGS: MRA NECK FINDINGS

Precontrast time-of-flight images demonstrate antegrade flow in the
bilateral cervical carotid and vertebral arteries. Codominant
vertebral arteries to the skull base. Carotid bifurcations are
within normal limits.

Post-contrast neck MRA images reveal a 3 vessel arch configuration.

Tortuous proximal right CCA with mild irregularity, no significant
stenosis. Patent right carotid bifurcation. Tortuous cervical right
ICA with mild irregularity, no significant stenosis.

Tortuous left CCA without stenosis. Patent left carotid bifurcation.
Mildly tortuous cervical left ICA with minimal irregularity and no
stenosis.

Both proximal subclavian arteries are patent with mild tortuosity, a
kinked appearance at the thoracic inlet. Normal bilateral vertebral
artery origins. Tortuous left V1 segment. Codominant vertebral
arteries are patent and normal to the vertebrobasilar junction.
Patent PICA origins incidentally noted.

Visible ICA siphons, carotid termini, and basilar appear grossly
normal. See below.

MRA HEAD FINDINGS

Antegrade flow in the posterior circulation with codominant distal
vertebral arteries. Normal PICA origins. Patent vertebrobasilar
junction with no distal vertebral stenosis. Patent basilar artery
without stenosis. Patent SCA and PCA origins. Posterior
communicating arteries are diminutive or absent. Minimal to mild
bilateral PCA branch irregularity without significant stenosis.

Antegrade flow in both ICA siphons. Mild bilateral siphon
irregularity in keeping with atherosclerosis. No siphon stenosis is
identified although on the left there is a small laterally directed
2 mm saccular lesion of the cavernous segment which could be a tiny
cavernous aneurysm or atherosclerotic pseudo lesion (series 5, image
104). Patent ophthalmic artery origins. Patent carotid termini.
Normal MCA and ACA origins.

Diminutive anterior communicating artery. Visible ACA branches are
within normal limits. Right MCA M1 segment, right MCA bifurcation,
and visible right MCA branches are within normal limits.

Left MCA M1 segment and left MCA bifurcation are patent without
stenosis. Visible left MCA branches are within normal limits.
IMPRESSION: 1. Negative for large vessel occlusion.

2. Mild atherosclerosis in the neck without stenosis. Tortuous
cervical carotid arteries.

3. Evidence of intracranial atherosclerosis but no significant
intracranial stenosis.

4. Positive for a small 2 mm saccular lesion arising laterally from
the cavernous Left ICA. Differential is tiny cavernous aneurysm
versus atherosclerotic pseudo-lesion.

## 2020-11-06 IMAGING — MR MR MRA HEAD W/O CM
1 series · 18 of 48 positions shown · IV contrast (gadavist)
Comparison: Brain MRI today reported separately.

CLINICAL DATA: 79-year-old female code stroke presentation this
morning. Slurred speech and right facial droop.

EXAM:
MRA HEAD WITHOUT CONTRAST
MRA NECK WITHOUT AND WITH CONTRAST
TECHNIQUE: Angiographic images of the Circle of Willis were obtained using MRA
technique without intravenous contrast. Angiographic images of the
neck were obtained using MRA technique without and with intravenous
contrast. Carotid stenosis measurements (when applicable) are
obtained utilizing NASCET criteria, using the distal internal
carotid diameter as the denominator.
CONTRAST:  8mL GADAVIST GADOBUTROL 1 MMOL/ML IV SOLN

[Series 5: TOF · axial · 0.5mm · 0.41mm/px · z∈[-116,-20]mm · 18 of 205 slices shown]
[im 1/205]
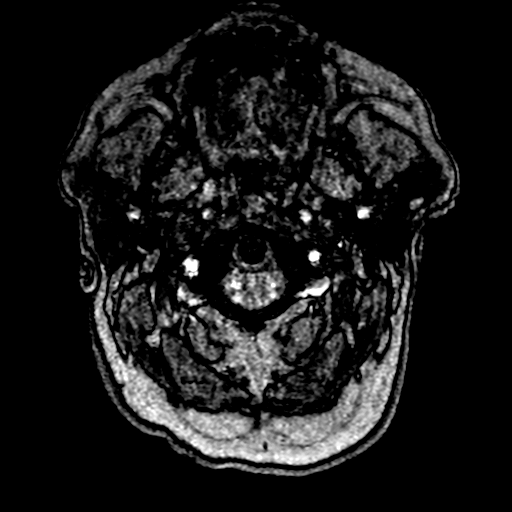
[im 5/205]
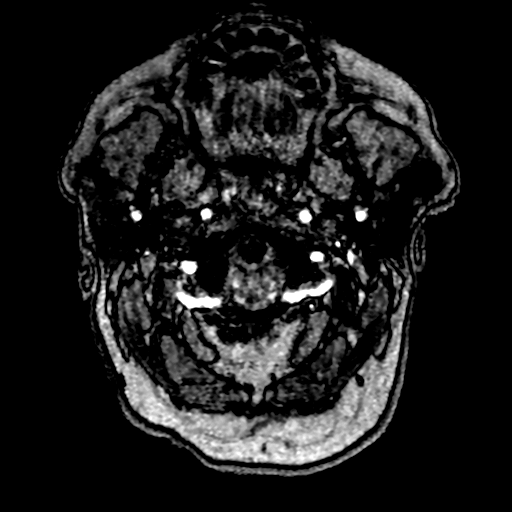
[im 9/205]
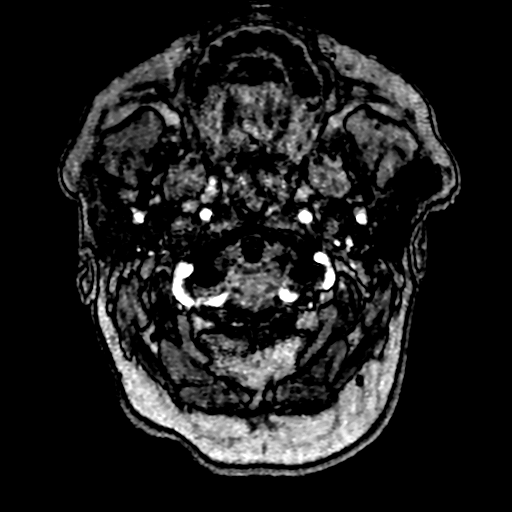
[im 14/205]
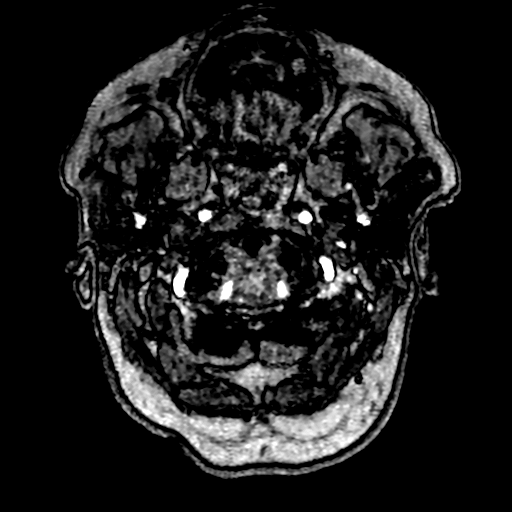
[im 18/205]
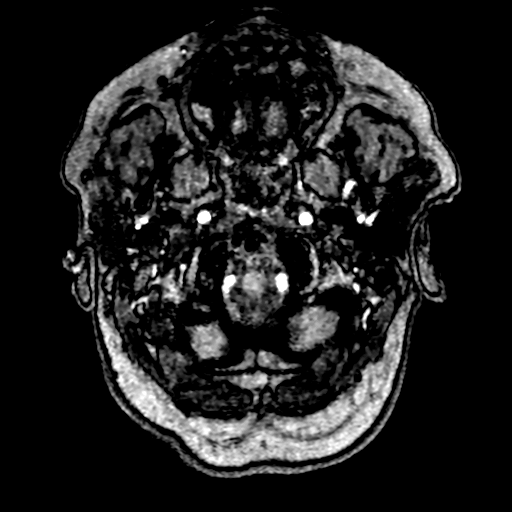
[im 22/205]
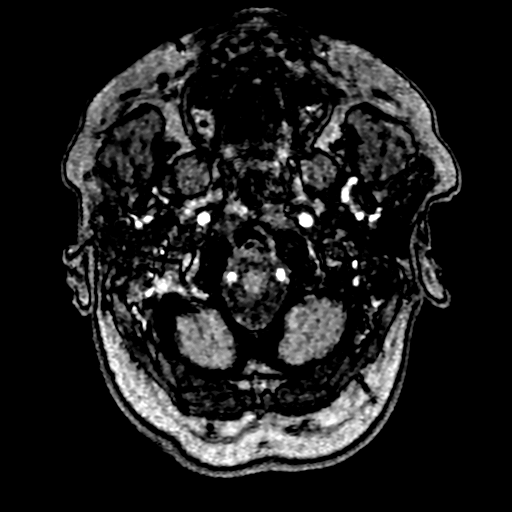
[im 27/205]
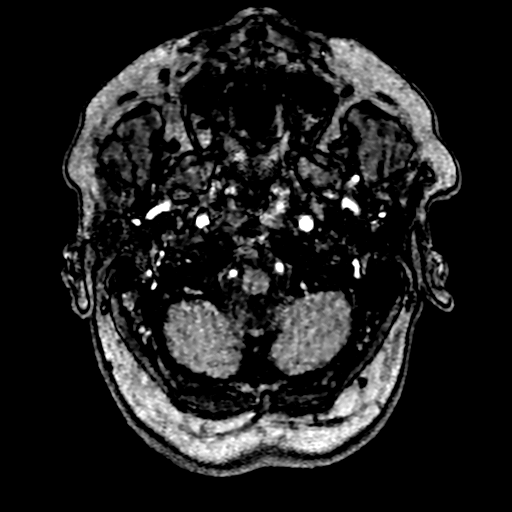
[im 31/205]
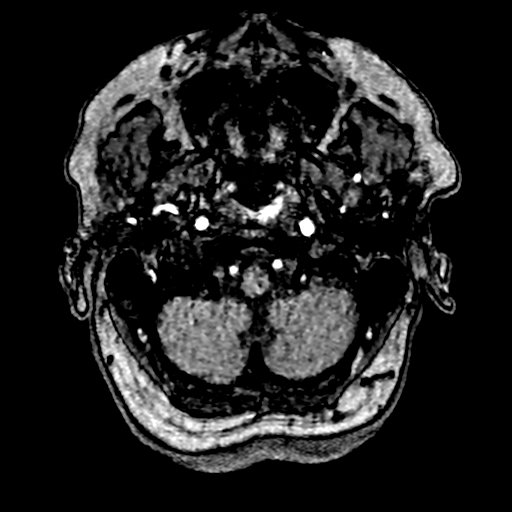
[im 35/205]
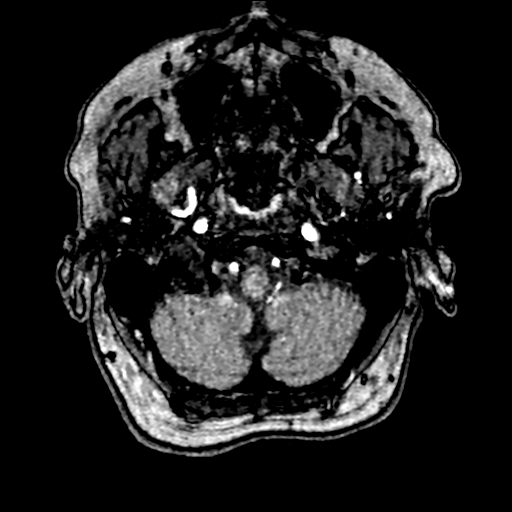
[im 40/205]
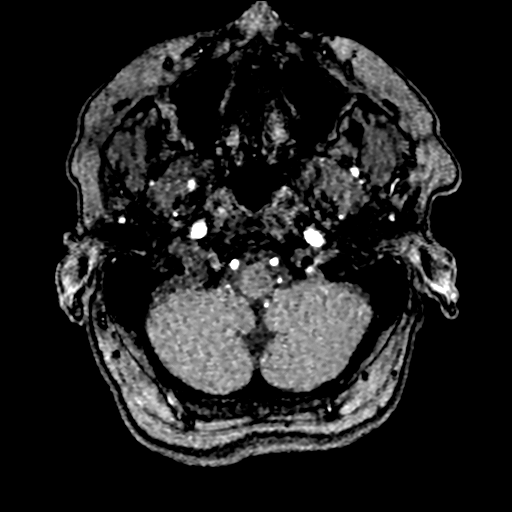
[im 66/205]
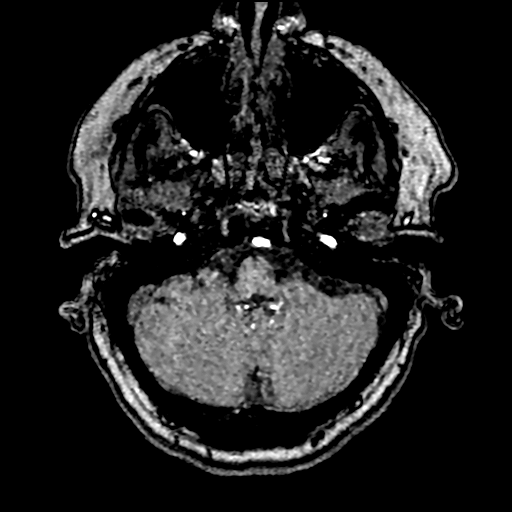
[im 92/205]
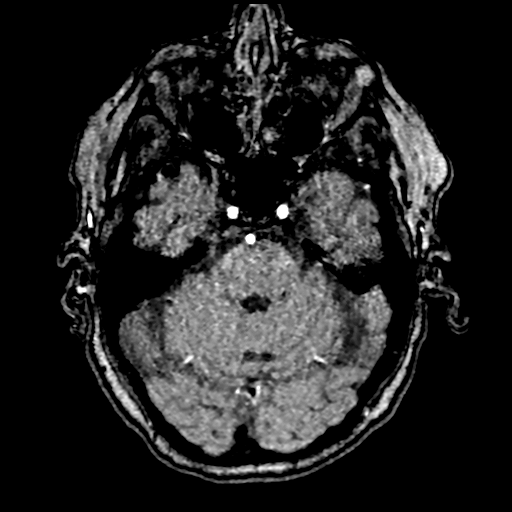
[im 105/205]
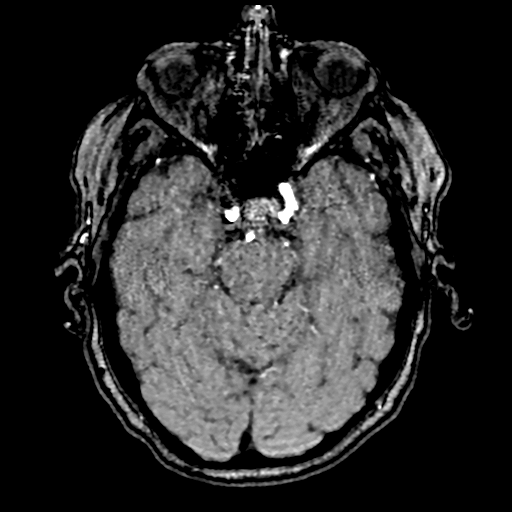
[im 118/205]
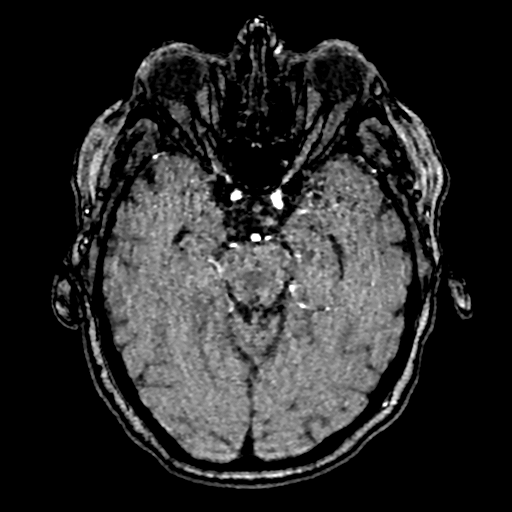
[im 144/205]
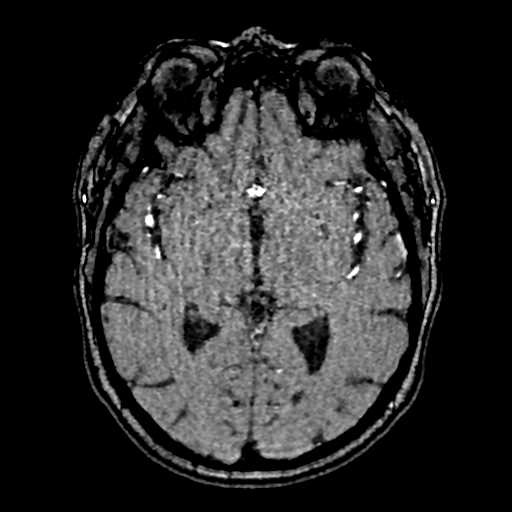
[im 170/205]
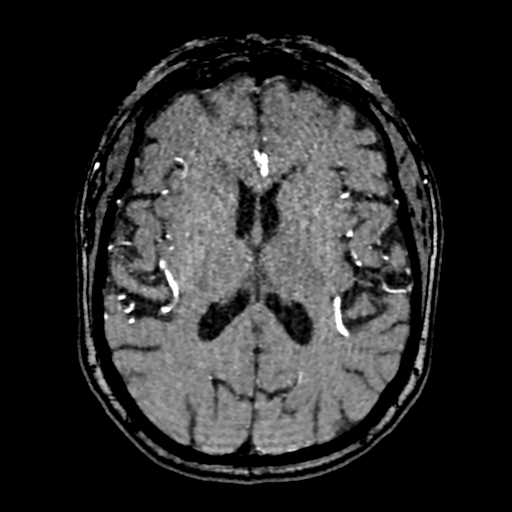
[im 174/205]
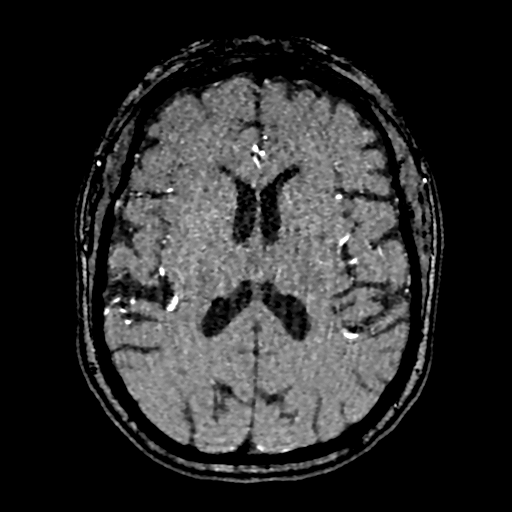
[im 196/205]
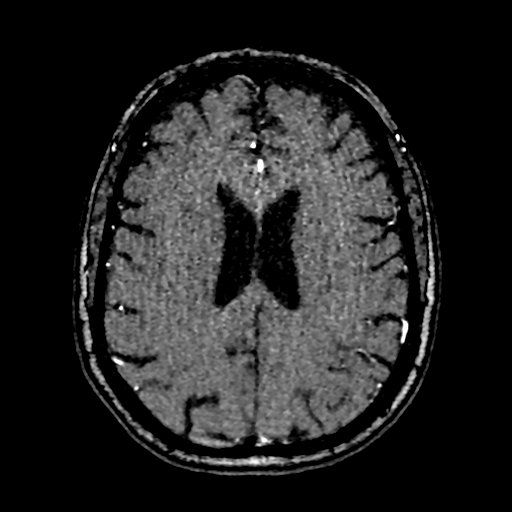

[18 of 48 positions shown; findings below may reference images not displayed]

FINDINGS: MRA NECK FINDINGS

Precontrast time-of-flight images demonstrate antegrade flow in the
bilateral cervical carotid and vertebral arteries. Codominant
vertebral arteries to the skull base. Carotid bifurcations are
within normal limits.

Post-contrast neck MRA images reveal a 3 vessel arch configuration.

Tortuous proximal right CCA with mild irregularity, no significant
stenosis. Patent right carotid bifurcation. Tortuous cervical right
ICA with mild irregularity, no significant stenosis.

Tortuous left CCA without stenosis. Patent left carotid bifurcation.
Mildly tortuous cervical left ICA with minimal irregularity and no
stenosis.

Both proximal subclavian arteries are patent with mild tortuosity, a
kinked appearance at the thoracic inlet. Normal bilateral vertebral
artery origins. Tortuous left V1 segment. Codominant vertebral
arteries are patent and normal to the vertebrobasilar junction.
Patent PICA origins incidentally noted.

Visible ICA siphons, carotid termini, and basilar appear grossly
normal. See below.

MRA HEAD FINDINGS

Antegrade flow in the posterior circulation with codominant distal
vertebral arteries. Normal PICA origins. Patent vertebrobasilar
junction with no distal vertebral stenosis. Patent basilar artery
without stenosis. Patent SCA and PCA origins. Posterior
communicating arteries are diminutive or absent. Minimal to mild
bilateral PCA branch irregularity without significant stenosis.

Antegrade flow in both ICA siphons. Mild bilateral siphon
irregularity in keeping with atherosclerosis. No siphon stenosis is
identified although on the left there is a small laterally directed
2 mm saccular lesion of the cavernous segment which could be a tiny
cavernous aneurysm or atherosclerotic pseudo lesion (series 5, image
104). Patent ophthalmic artery origins. Patent carotid termini.
Normal MCA and ACA origins.

Diminutive anterior communicating artery. Visible ACA branches are
within normal limits. Right MCA M1 segment, right MCA bifurcation,
and visible right MCA branches are within normal limits.

Left MCA M1 segment and left MCA bifurcation are patent without
stenosis. Visible left MCA branches are within normal limits.
IMPRESSION: 1. Negative for large vessel occlusion.

2. Mild atherosclerosis in the neck without stenosis. Tortuous
cervical carotid arteries.

3. Evidence of intracranial atherosclerosis but no significant
intracranial stenosis.

4. Positive for a small 2 mm saccular lesion arising laterally from
the cavernous Left ICA. Differential is tiny cavernous aneurysm
versus atherosclerotic pseudo-lesion.

## 2020-11-06 IMAGING — MR MR HEAD W/O CM
11 series · 40 of 48 positions shown · non-contrast
Comparison: Plain head CT [H7] hours today.

CLINICAL DATA: 79-year-old female code stroke presentation this
morning. Slurred speech and right facial droop.

EXAM:
MRI HEAD WITHOUT CONTRAST
TECHNIQUE: Multiplanar, multiecho pulse sequences of the brain and surrounding
structures were obtained without intravenous contrast.

[Series 5: ax dwi_tracew · axial · 3.0mm · 0.60mm/px · z∈[-111,+43]mm · 4 of 48 slices shown]
[im 1/48]
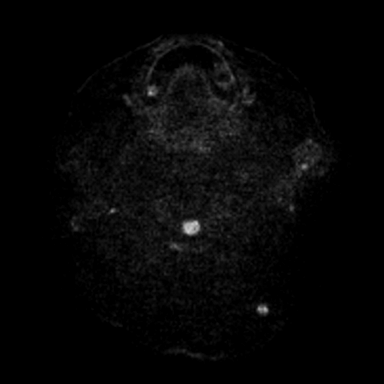
[im 16/48]
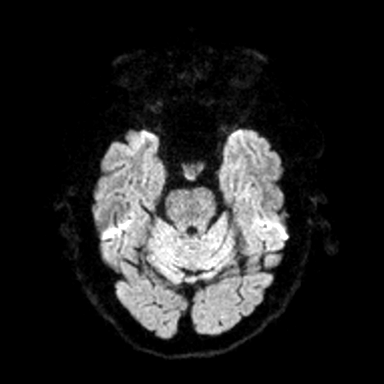
[im 32/48]
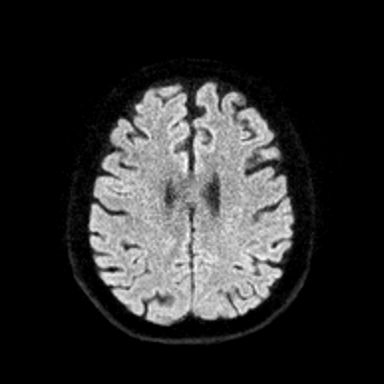
[im 48/48]
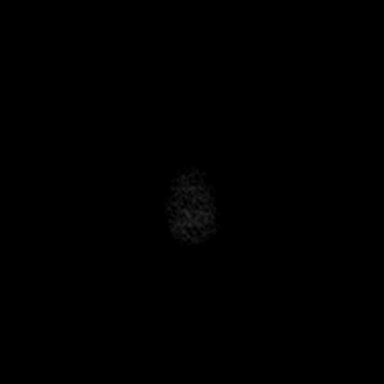

[Series 6: ax dwi_adc · axial · 3.0mm · 0.60mm/px · z∈[-111,+43]mm · 4 of 48 slices shown]
[im 1/48]
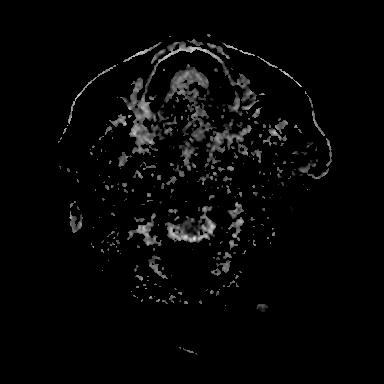
[im 16/48]
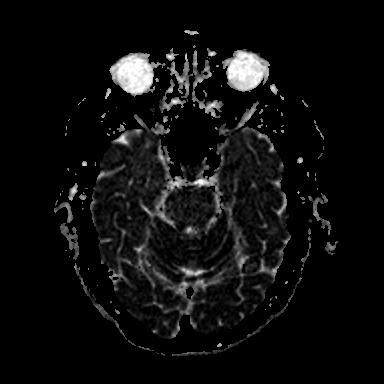
[im 32/48]
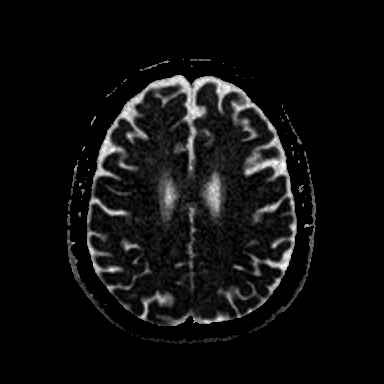
[im 48/48]
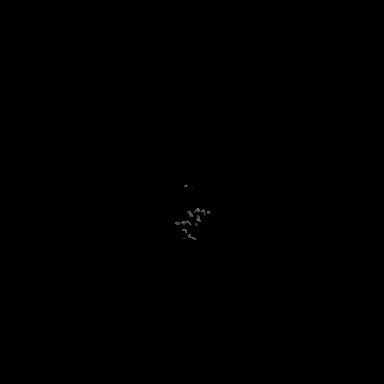

[Series 7: cor dwi_tracew · coronal · 5.0mm · 0.60mm/px · 3 of 38 slices shown]
[im 1/38]
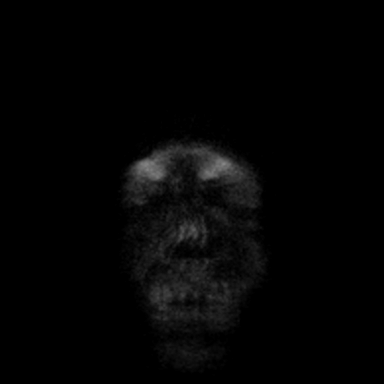
[im 19/38]
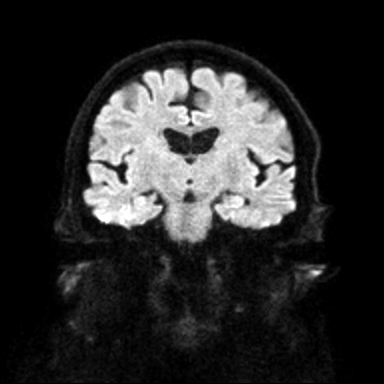
[im 38/38]
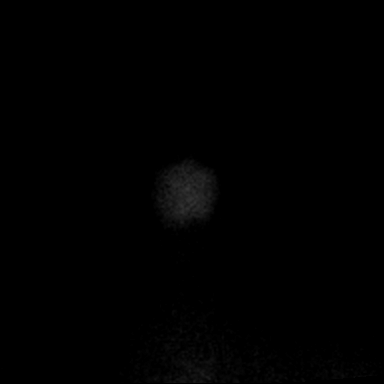

[Series 8: cor dwi_adc · coronal · 5.0mm · 0.60mm/px · 3 of 37 slices shown]
[im 1/37]
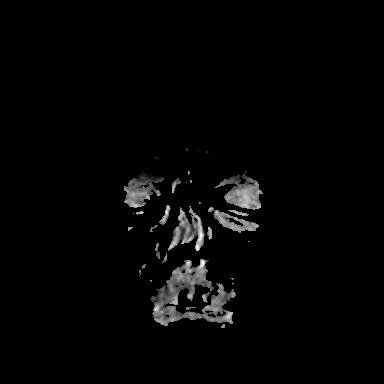
[im 19/37]
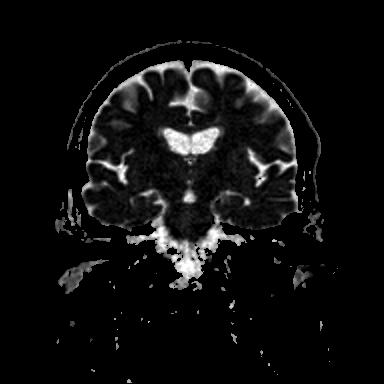
[im 37/37]
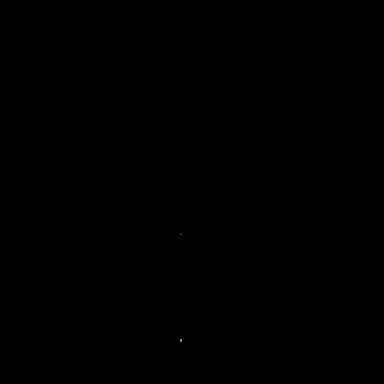

[Series 9: T1 · sagittal · 5.0mm · 0.62mm/px · 2 of 23 slices shown (1 of 2)]
[im 1/23]
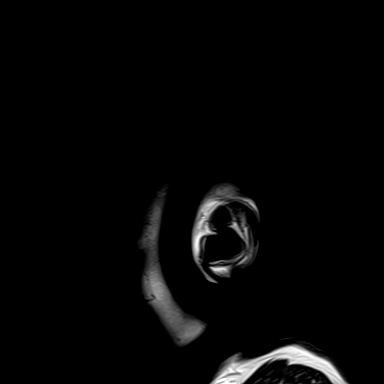
[im 23/23]
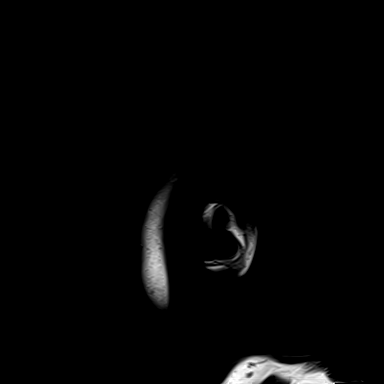

[Series 10: T2 · axial · 5.0mm · 0.53mm/px · z∈[-111,+44]mm · 2 of 27 slices shown (1 of 2)]
[im 1/27]
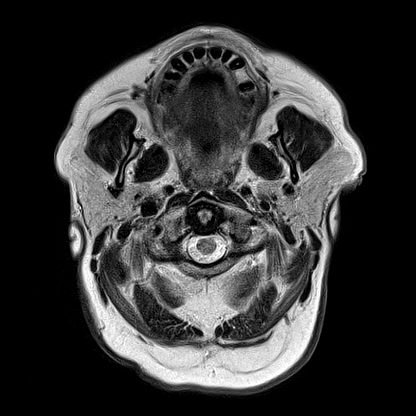
[im 27/27]
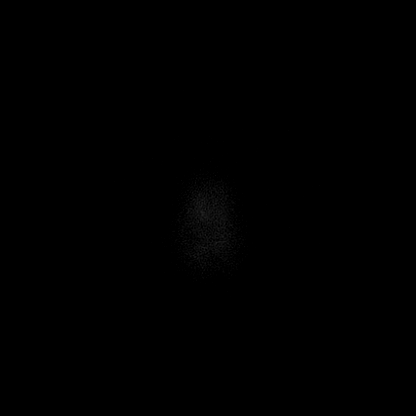

[Series 12: pha_images · axial · 3.0mm · 0.90mm/px · z∈[-119,+54]mm · 5 of 57 slices shown]
[im 1/57]
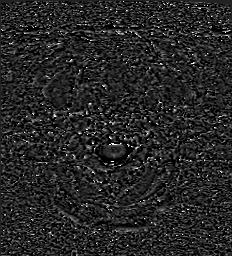
[im 15/57]
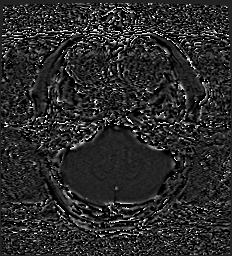
[im 29/57]
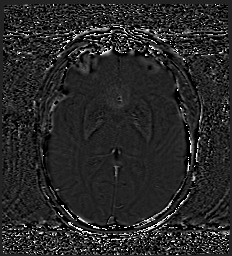
[im 43/57]
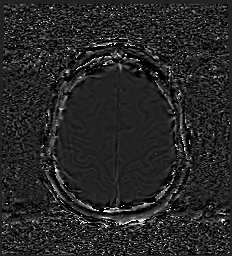
[im 57/57]
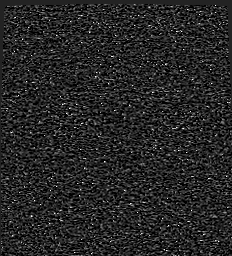

[Series 13: swi_images · axial · 3.0mm · 0.90mm/px · z∈[-122,-80]mm · 2 of 60 slices shown]
[im 1/60]
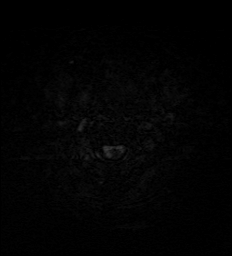
[im 15/60]
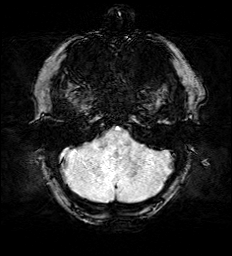

[Series 15: FLAIR · axial · 3.0mm · 0.53mm/px · z∈[-114,+47]mm · 5 of 55 slices shown]
[im 1/55]
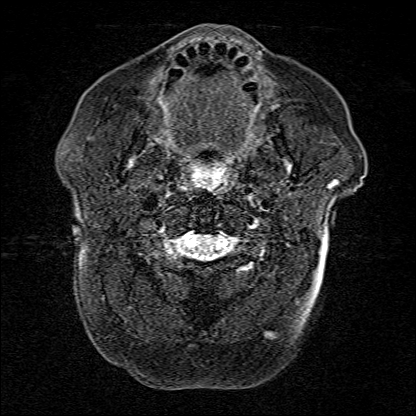
[im 14/55]
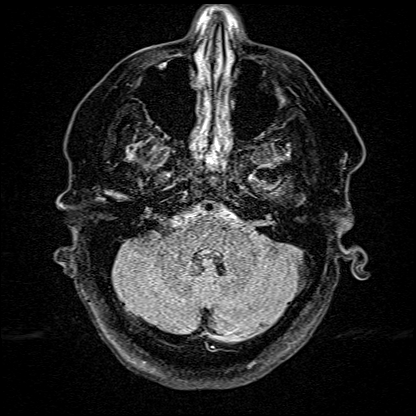
[im 28/55]
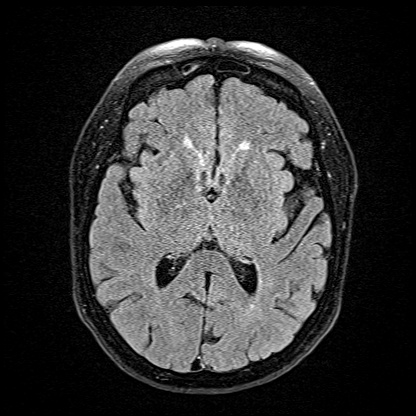
[im 41/55]
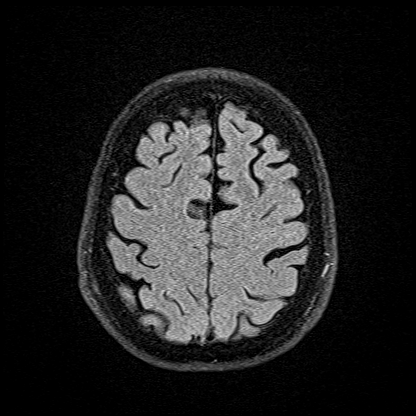
[im 55/55]
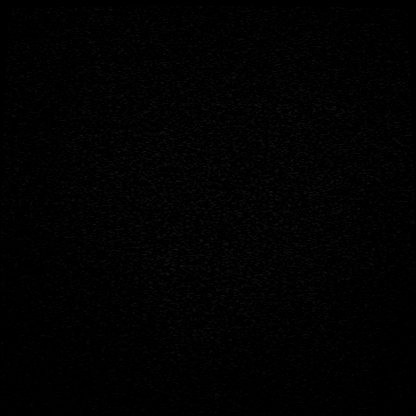

[Series 16: T1 · axial · 1.0mm · 0.98mm/px · z∈[-114,+43]mm · 8 of 160 slices shown (2 of 2)]
[im 1/160]
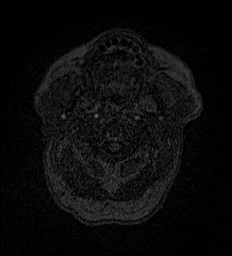
[im 27/160]
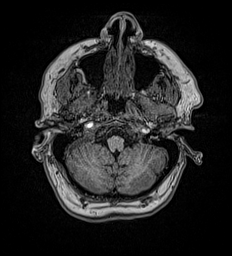
[im 54/160]
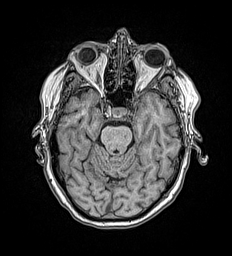
[im 67/160]
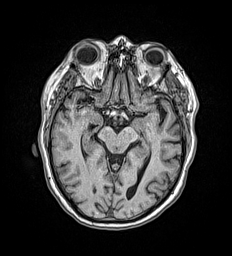
[im 93/160]
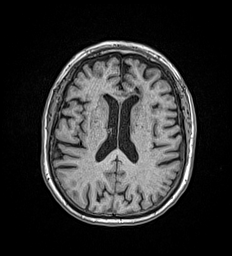
[im 107/160]
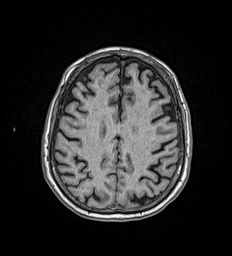
[im 133/160]
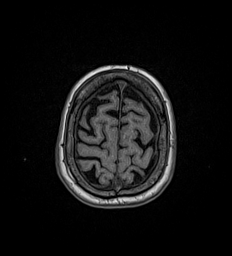
[im 160/160]
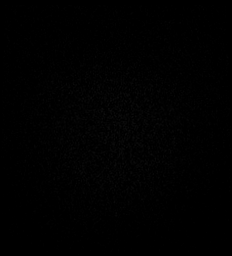

[Series 17: T2 · coronal · 5.0mm · 0.57mm/px · 2 of 29 slices shown (2 of 2)]
[im 1/29]
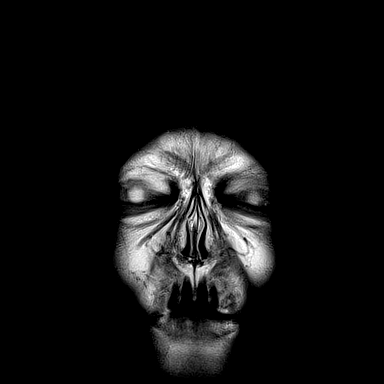
[im 29/29]
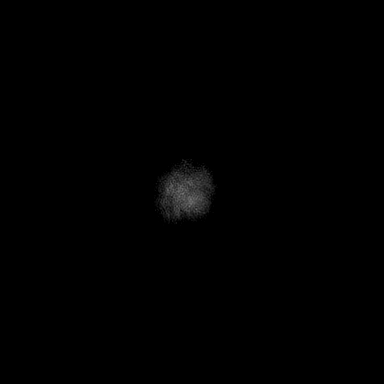

[40 of 48 positions shown; findings below may reference images not displayed]

FINDINGS: Brain: Patchy restricted diffusion in a 10-12 mm area of the
posterior left corona radiata (series 5, image 30, series 8, image
18). No other abnormal diffusion. Faint T2 and FLAIR hyperintensity
associated at that area. No hemorrhage or mass effect.

Scattered bilateral T2 and FLAIR hyperintensity elsewhere, including
lesions near the left frontal horn and at the anterior right corona
radiata most resembling chronic white matter lacunar infarcts
(series 15, image 32).

No cortical encephalomalacia or definite chronic cerebral blood
products. Mild for age T2 heterogeneity in the bilateral deep gray
nuclei and the pons. No midline shift, mass effect, evidence of mass
lesion, ventriculomegaly, extra-axial collection or acute
intracranial hemorrhage. Cervicomedullary junction and pituitary are
within normal limits.

Vascular: Major intracranial vascular flow voids are preserved.

Skull and upper cervical spine: C3-C4 chronic disc and endplate
degeneration. No definite upper cervical spinal stenosis marrow
signal. Normal background bone

Sinuses/Orbits: Postoperative changes to both globes, otherwise
negative.

Other: Mastoids are clear. Visible internal auditory structures
appear normal. Visible scalp and face appear negative.
IMPRESSION: 1. Positive for patchy 12 mm acute white matter infarct in the Left
corona radiata. No associated hemorrhage or mass effect.
These results were communicated to Dr. ANSHU at [H7] hours on
[DATE] by text page via the [employer] messaging system.

2. Head and Neck MRA reported separately.

3. Underlying mild to moderate for age signal changes compatible
with chronic small vessel disease, with mild involvement of the deep
gray nuclei and pons.

## 2020-11-06 MED ORDER — ATORVASTATIN CALCIUM 20 MG PO TABS
40.0000 mg | ORAL_TABLET | Freq: Every day | ORAL | Status: DC
  Administered 2020-11-06: 22:00:00 40 mg via ORAL
  Filled 2020-11-06: qty 2

## 2020-11-06 MED ORDER — TRETINOIN 0.05 % EX CREA: 1.0000 "application " | TOPICAL_CREAM | Freq: Two times a day (BID) | CUTANEOUS | Status: DC

## 2020-11-06 MED ORDER — ATORVASTATIN CALCIUM 20 MG PO TABS: 80.0000 mg | ORAL_TABLET | Freq: Every day | ORAL | Status: DC

## 2020-11-06 MED ORDER — ASPIRIN 325 MG PO TABS: 325.0000 mg | ORAL_TABLET | Freq: Every day | ORAL | Status: DC

## 2020-11-06 MED ORDER — CLOPIDOGREL BISULFATE 75 MG PO TABS
75.0000 mg | ORAL_TABLET | Freq: Every day | ORAL | Status: DC
  Administered 2020-11-07: 10:00:00 75 mg via ORAL
  Filled 2020-11-06: qty 1

## 2020-11-06 MED ORDER — MIRABEGRON ER 25 MG PO TB24
25.0000 mg | ORAL_TABLET | Freq: Every day | ORAL | Status: DC
  Administered 2020-11-06: 22:00:00 25 mg via ORAL
  Filled 2020-11-06 (×2): qty 1

## 2020-11-06 MED ORDER — ADULT MULTIVITAMIN W/MINERALS CH
1.0000 | ORAL_TABLET | Freq: Every day | ORAL | Status: DC
  Administered 2020-11-06 – 2020-11-07 (×2): 1 via ORAL
  Filled 2020-11-06 (×2): qty 1

## 2020-11-06 MED ORDER — METOCLOPRAMIDE HCL 5 MG PO TABS
10.0000 mg | ORAL_TABLET | Freq: Once | ORAL | Status: AC
  Administered 2020-11-06: 14:00:00 10 mg via ORAL
  Filled 2020-11-06: qty 2

## 2020-11-06 MED ORDER — VITAMIN B-12 1000 MCG PO TABS
1000.0000 ug | ORAL_TABLET | Freq: Every day | ORAL | Status: DC
  Administered 2020-11-06 – 2020-11-07 (×2): 1000 ug via ORAL
  Filled 2020-11-06 (×2): qty 1

## 2020-11-06 MED ORDER — ONDANSETRON HCL 4 MG/2ML IJ SOLN: 4.0000 mg | Freq: Once | INTRAMUSCULAR | Status: AC

## 2020-11-06 MED ORDER — GNP TRIPLE OMEGA COMPLEX PO CPDR: 2.0000 | DELAYED_RELEASE_CAPSULE | Freq: Every day | ORAL | Status: DC

## 2020-11-06 MED ORDER — ASPIRIN 300 MG RE SUPP: 300.0000 mg | Freq: Every day | RECTAL | Status: DC

## 2020-11-06 MED ORDER — GADOBUTROL 1 MMOL/ML IV SOLN
8.0000 mL | Freq: Once | INTRAVENOUS | Status: AC | PRN
  Administered 2020-11-06: 8 mL via INTRAVENOUS

## 2020-11-06 MED ORDER — CLOPIDOGREL BISULFATE 75 MG PO TABS
300.0000 mg | ORAL_TABLET | Freq: Once | ORAL | Status: AC
  Administered 2020-11-06: 300 mg via ORAL
  Filled 2020-11-06: qty 4

## 2020-11-06 MED ORDER — FENOFIBRATE 54 MG PO TABS
54.0000 mg | ORAL_TABLET | Freq: Every day | ORAL | Status: DC
  Administered 2020-11-06: 54 mg via ORAL
  Filled 2020-11-06 (×2): qty 1

## 2020-11-06 MED ORDER — ONDANSETRON HCL 4 MG/2ML IJ SOLN
INTRAMUSCULAR | Status: AC
  Administered 2020-11-06: 4 mg via INTRAVENOUS
  Filled 2020-11-06: qty 2

## 2020-11-06 MED ORDER — VITAMIN D3 25 MCG (1000 UNIT) PO TABS
2000.0000 [IU] | ORAL_TABLET | Freq: Every day | ORAL | Status: DC
  Administered 2020-11-06 – 2020-11-07 (×2): 2000 [IU] via ORAL
  Filled 2020-11-06 (×3): qty 2

## 2020-11-06 MED ORDER — FLUTICASONE PROPIONATE 50 MCG/ACT NA SUSP
2.0000 | Freq: Every day | NASAL | Status: DC
  Administered 2020-11-06 – 2020-11-07 (×2): 2 via NASAL
  Filled 2020-11-06: qty 16

## 2020-11-06 MED ORDER — MAGNESIUM OXIDE 400 (241.3 MG) MG PO TABS
400.0000 mg | ORAL_TABLET | Freq: Two times a day (BID) | ORAL | Status: DC
  Administered 2020-11-06 – 2020-11-07 (×3): 400 mg via ORAL
  Filled 2020-11-06 (×4): qty 1

## 2020-11-06 MED ORDER — STROKE: EARLY STAGES OF RECOVERY BOOK: Freq: Once | Status: AC

## 2020-11-06 MED ORDER — ACETAMINOPHEN 500 MG PO TABS
500.0000 mg | ORAL_TABLET | Freq: Four times a day (QID) | ORAL | Status: DC | PRN
  Filled 2020-11-06: qty 1

## 2020-11-06 MED ORDER — GLUCOSAMINE-CHONDROITIN 250-200 MG PO TABS: ORAL_TABLET | Freq: Two times a day (BID) | ORAL | Status: DC

## 2020-11-06 NOTE — ED Notes (Signed)
Pt back from MRI 

## 2020-11-06 NOTE — Consult Note (Signed)
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  She reports that she had her booster shot of Moderna on Friday.  Yesterday was a normal day other than she ate out twice which is slightly unusual.  This morning she was feeling a bit nauseated and had some soft stool but no diarrhea or vomiting, which she attributed to her diet yesterday.  When she initially woke up at 6:30 AM she did not immediately notice anything wrong however when she was talking to her dog at 7:30 AM (the first time she was speaking out loud in the day) she felt like she had some slurred speech.  Upon looking in the mirror she felt like she had a right facial droop.  She therefore activated EMS.  She reports she has a long standing history of motion sickness and is feeling fairly motion sick after the ambulance ride and transfer to scanners etc.  Associated with this she has mild headache.  She has her baseline congestion and cough that is typical with her asthma history and rhinitis with change in seasons.  She denies any other signs or symptoms of infection.  She denies any vision changes, hearing loss, swallowing difficulties or focal numbness or weakness other than the facial droop.  While her nausea is persistent, she feels that her slurred speech and facial droop are rapidly improving and nearly at baseline at the time of my evaluation.  NIH stroke scale is 1 for subtle right facial droop at rest with symmetric activation (could be considered to 0 as well)   LKW:  8:30 PM on 11/15 tPA given?: No, due to out of the window and too mild to treat IA performed?: No, due to no LVO Premorbid modified rankin scale:      0 - No symptoms.  ROS: A full ROS was performed and is negative except as noted in the HPI.   Past Medical History:  Diagnosis Date  . Acne rosacea   . Allergic rhinitis   . Chronic kidney disease    kidney stones, Nov. 2013- tx with Lithotripsy, still has some present   . Cough variant asthma   . Degenerative disc disease    knees, ankles, back- OA  . GERD (gastroesophageal reflux disease)   . Headache(784.0)    since stopping aleve- 07/30/2013  . Hyperlipemia    dyslipidemia  . Hypertension    [facility] cardiac pharmacist follows pt. [name] ,has never taken anti-hypertensive  . Osteoarthritis   . Plantar fasciitis    (using orthotics-they are wearing out)- Dr Carrolyn Leigh  . Pneumonia    double pneumonia - relative to sinus infection , had chemical cauterization   . PONV (postoperative nausea and vomiting)    urgency, N&V for days, memory & processing  . Sleep apnea    Bringing cpap mask and tubing, study last done- 2006   Past Surgical History:  Procedure Laterality Date  . ABDOMINAL HYSTERECTOMY  1990's  . BLADDER REPAIR    . BLADDER SUSPENSION  90's  . MAXIMUM ACCESS (MAS)POSTERIOR LUMBAR INTERBODY FUSION (PLIF) 2 LEVEL  N/A 08/12/2013   Procedure: FOR MAXIMUM ACCESS (MAS) POSTERIOR LUMBAR INTERBODY FUSION (PLIF) 2 LEVEL;  Surgeon: Eustace Moore, MD;  Location: [facility] [facility];  Service: Neurosurgery;  Laterality: N/A;  FOR MAXIMUM ACCESS (MAS) POSTERIOR LUMBAR INTERBODY FUSION (PLIF) 2 LEVEL  . MULTIPLE TOOTH EXTRACTIONS     wisdom teeth extractions   . POSTERIOR FUSION LUMBAR SPINE  08/12/2013   Dr Ronnald Ramp  . TONSILLECTOMY     Family Hx:  Both mother and father had CAD - multiple MIs. Brother with h/o CAD/PCI per chart review   Social History:  reports that she has never smoked. She has never used smokeless tobacco.  She reports current alcohol use. She reports that she does not use drugs. Specifically, she reports she drinks in a glass of wine occasionally and has not drank recently  Exam: Current vital signs: BP (!) 171/83   Pulse 97   Temp 98.6 F (37 C) (Oral)   Resp (!) 21   Ht '5\' 2"'  (1.575 m)   Wt 81.2 kg   SpO2 98%   BMI 32.74 kg/m  Vital signs in last 24 hours: Temp:  [98.6 F (37 C)] 98.6 F (37 C) (11/16 0939) Pulse Rate:  [97-100] 97 (11/16 0945) Resp:  [16-21] 21 (11/16 1000) BP: (171-177)/(81-83) 171/83 (11/16 1000) SpO2:  [97 %-98 %] 98 % (11/16 0945) Weight:  [81.2 kg] 81.2 kg (11/16 0933)   Physical Exam  Constitutional: Appears well-developed and well-nourished.  Mildly uncomfortable Psych: Affect appropriate to situation, mildly anxious Eyes: No scleral injection HENT: No OP obstruction.  MSK: no joint deformities.  Cardiovascular: Normal rate and regular rhythm.  Respiratory: Effort normal, non-labored breathing GI: Soft.  No distension. There is no tenderness.  Skin: Warm dry and intact visible skin  Neuro: Mental Status: Patient is awake, alert, oriented to person, place, month, year, and situation. Patient is able to give a clear and coherent history. No signs of aphasia or neglect Cranial Nerves: II: Visual Fields are full. Pupils are equal, round, and reactive to light.  3 mm to 2 mm III,IV, VI: EOMI without ptosis or diploplia.  V: Facial sensation is symmetric to temperature VII: Facial movement is symmetric notable for a mild right nasal labial fold flattening that is symmetric with activation  VIII: hearing is intact to voice and symmetric to finger rub X: Uvula elevates symmetrically XI: Shoulder shrug is symmetric. XII: tongue is midline without atrophy or fasciculations.  Motor: Bulk is normal. 5/5 strength was present in all four extremities.  Sensory: Sensation is symmetric to light touch and temperature in the arms and legs.  No extinction to  double simultaneous stimuli Deep Tendon Reflexes: 2+ and symmetric in the biceps and patellae.  Plantars: Toes are downgoing bilaterally.  Cerebellar: FNF and HKS are intact bilaterally Gait: Normal casual gait, slightly unsteady tandem which she reports is her baseline secondary to her chronic back issues  I have reviewed labs in EPIC and the results pertinent to this consultation are: Creatinine 1.25 (no recent baseline in our system), GFR 44, mildly elevated BUN at 25, no significant electrolyte derangements other than slightly elevated glucose at 137, normal CBC  I have personally reviewed the images obtained: Head CT with chronic microvascular disease and scattered age indeterminate hypodensities, no clear acute process MRI brain with acute left lacunar stroke  MRA head without LVO  MRA neck pending   Impression: This is a 79 year old woman with significant stroke risk factors presenting with elevated  blood pressure, nausea, and rapidly resolving right facial droop/slurred speech.  Differential includes hypertensive emergency (recrudescence of an old stroke in the setting of high blood pressure), stuttering acute lacunar stroke, TIA, or potential basilar occlusion (the latter of which is less likely given no fluctuating alertness and no cranial nerve abnormalities on examination, but should be excluded given the seriousness of the diagnosis).  Given her baseline renal dysfunction, will obtain MRA head and neck at the same time as MRI brain to clear her vasculature.  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  - Admit for stroke workup - Stroke labs TSH, ESR, HgbA1c, fasting lipid panel - MRI brain revealed acute stroke as above - MRA of the brain without contrast and MRA  neck w/wo   - Frequent neuro checks - Echocardiogram - Prophylactic therapy-Antiplatelet med: Aspirin - dose 356m PO or 3090mPR, followed by 81 mg daily, please order if sulfasalazine allergy allows  - Plavix 300 mg load with 75 mg daily for 21 - 90 day course; consider plavix monotherapy life long for secondary prevention if patient cannot tolerate ASA - Risk factor modification - Telemetry monitoring; 30 day event monitor on discharge if no arrythmias captured  - Blood pressure goal: Management of hypertensive emergency/urgency per standard protocols (gradually reduce BP)  - PT consult, OT consult, Speech consult, unless patient is back to baseline - Neurology to follow

## 2020-11-06 NOTE — Progress Notes (Signed)
Patient brought in home CPAP unit to use while in hospital. Unit was assessed and no frayed wires nor cracks found on unit upon assessment.

## 2020-11-06 NOTE — Progress Notes (Signed)
OT Cancellation Note  Patient Details Name: Lisa [name], Dr. MRN: [id number] DOB: [birth year]   Cancelled Treatment:    Reason Eval/Treat Not Completed: OT screened, no needs identified, will sign off. Consult received, chart reviewed. 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 Pt reporting functionally at baseline for ADL tasks, no difficulty appreciated. Very slight slur of words occasionally, no other neuro deficits noted. No acute skilled OT needs at this time. Will sign off. Please reconsult if additional needs arise. Thank you for this consult.   Richrd Prime, MPH, MS, OTR/L ascom [phone] 11/06/20, 2:42 PM

## 2020-11-06 NOTE — ED Notes (Signed)
Pt being screened for MRI at this time.

## 2020-11-06 NOTE — Progress Notes (Signed)
CODE STROKE- PHARMACY COMMUNICATION   Time CODE STROKE called/page received:[id number]  Time response to CODE STROKE was made in person   Time Stroke Kit not needed.  Name of Provider/Nurse Pryor, PharmD, [facility] 11/06/2020 10:19 AM

## 2020-11-06 NOTE — ED Notes (Addendum)
Pt reports improvement in nausea.

## 2020-11-06 NOTE — Plan of Care (Signed)
  Problem: Education: Goal: Knowledge of disease or condition will improve Outcome: Progressing Goal: Knowledge of patient specific risk factors addressed and post discharge goals established will improve Outcome: Progressing   

## 2020-11-06 NOTE — Evaluation (Signed)
Physical Therapy Evaluation Patient Details Name: Lisa [name], Dr. MRN: [id number] DOB: [birth year] Today's Date: 11/06/2020   History of Present Illness  Lisa [name] is a 79yoF who [facility] on 11/16 c Right facial droop. Pt seen by neurology, MRI revealing of Left Lacunar CVA. PMH includes chornic back pain/surgery. PTA pt lives in indepdendent living at [facility], AMB without device, lives alone.  Clinical Impression  Pt admitted with above diagnosis. Pt currently with functional limitations due to the deficits listed below (see "PT Problem List"). Upon entry, pt in bed, awake and agreeable to participate. RN passing meds upon entry. The pt is alert and oriented x4, pleasant, conversational, and generally a good historian. All mobility performed at baseline level of function, independently without any acute changes to balance, strength, no obvious asymetry. Pt reports only having some droop of face and mild difficulty speaking PTA. As patient is at baseline, all education completed, and time is given to address all questions/concerns. No additional skilled PT services needed at this time, PT signing off. PT recommends daily ambulation ad lib or with nursing staff as needed to prevent deconditioning.      Follow Up Recommendations No PT follow up    Equipment Recommendations  None recommended by PT    Recommendations for Other Services       Precautions / Restrictions Precautions Precautions: None      Mobility  Bed Mobility Overal bed mobility: Independent               Patient Response: Cooperative  Transfers Overall transfer level: Independent Equipment used: None                Ambulation/Gait Ambulation/Gait assistance: Independent Gait Distance (Feet): 400 Feet Assistive device: None Gait Pattern/deviations: WFL(Within Functional Limits) Gait velocity: 1.60m/s   General Gait Details: baseline level of function; no obvious unsteadiness or  balance issues.  Stairs            Wheelchair Mobility    Modified Rankin (Stroke Patients Only)       Balance Overall balance assessment: Independent;No apparent balance deficits (not formally assessed)                                           Pertinent Vitals/Pain Pain Assessment: No/denies pain    Home Living Family/patient expects to be discharged to:: Private residence Living Arrangements: Alone                    Prior Function Level of Independence: Independent   Gait / Transfers Assistance Needed: Tolerating community distances for walking dog  ADL's / Homemaking Assistance Needed: Fully independent, able to drive  Comments: has access to [facility], RW, and transport chair. No steps.     Hand Dominance   Dominant Hand: Left    Extremity/Trunk Assessment   Upper Extremity Assessment Upper Extremity Assessment: Overall WFL for tasks assessed    Lower Extremity Assessment Lower Extremity Assessment: Overall WFL for tasks assessed       Communication   Communication: No difficulties  Cognition Arousal/Alertness: Awake/alert Behavior During Therapy: WFL for tasks assessed/performed Overall Cognitive Status: Within Functional Limits for tasks assessed  General Comments      Exercises     Assessment/Plan    PT Assessment Patent does not need any further PT services  PT Problem List Decreased knowledge of use of DME       PT Treatment Interventions      PT Goals (Current goals can be found in the Care Plan section)  Acute Rehab PT Goals PT Goal Formulation: All assessment and education complete, DC therapy    Frequency     Barriers to discharge        Co-evaluation               AM-PAC PT "6 Clicks" Mobility  Outcome Measure Help needed turning from your back to your side while in a flat bed without using bedrails?: None Help needed moving from  lying on your back to sitting on the side of a flat bed without using bedrails?: None Help needed moving to and from a bed to a chair (including a wheelchair)?: None Help needed standing up from a chair using your arms (e.g., wheelchair or bedside chair)?: None Help needed to walk in hospital room?: None Help needed climbing 3-5 steps with a railing? : A Little 6 Click Score: 23    End of Session   Activity Tolerance: Patient tolerated treatment well;No increased pain Patient left: in bed;with call bell/phone within reach   PT Visit Diagnosis: Other symptoms and signs involving the nervous system (R29.898)    Time: 1401-1420 PT Time Calculation (min) (ACUTE ONLY): 19 min   Charges:   PT Evaluation $PT Eval Low Complexity: 1 Low         2:38 PM, 11/06/20 Rosamaria Lints, PT, DPT Physical Therapist - [facility]  [phone] (ASCOM)   Alisha Bacus C 11/06/2020, 2:34 PM

## 2020-11-06 NOTE — ED Notes (Signed)
CODE STROKE CALLED TO [phone] 

## 2020-11-06 NOTE — ED Triage Notes (Addendum)
Pt here via [facility] from [facility].  Pt noticed slurred speech and slight right facial droop this morning. Pt woke up at 730am and noticed voice changes when speaking to her dog. Pt reports small lisp noted at end of words and that her voice seems deeper. Unknown last known well. Denies any weakness/ numbness. Reports having her covid booster yesterday.  Ems vs- bp 184/83. Cbg 169, HR 80 NSR.   Hx hypertension.

## 2020-11-06 NOTE — Progress Notes (Signed)
  Chaplain On-Call responded to Code Stroke notification. Patient is being attended by medical team in preparation for transport to MRI.  Chaplain learned that no family is present. Chaplain assured team of future availability as needed.  Chaplain Marge Vandermeulen B. Isamar Nazir M.Div., [facility]

## 2020-11-06 NOTE — H&P (Signed)
History and Physical    Lisa [name], Dr. 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 female with medical history significant for hypertension, dyslipidemia, obstructive sleep apnea, stage III chronic kidney disease who presented to the ER for evaluation of right facial droop and slurred speech. Patient last known well was the night prior to admission.  She woke up about 6:30 AM and within the next hour while she was talking to her dog she felt she sounded funny like her speech was slurred.  She subsequently looked in the mirror and noticed right-sided facial droop.  She called EMS and was brought into the ER as a code stroke.   Since her arrival to the ER her symptoms have improved.  She has no focal deficits. She complains of a headache and nausea which she attributes to motion sickness.  She denies having any dizziness or lightheadedness.  She has no blurred vision or trouble swallowing.  She denies having any chest pain, no shortness of breath, no abdominal pain, no changes in her bowel habits, no fever, no chills, no cough, no shortness of breath. TPA was not administered because her symptoms have improved markedly and she was outside the window for TPA. Labs show sodium 139, potassium 4.1, chloride 94, bicarb 20, glucose 137, BUN 25, creatinine 1.25, calcium 9.4, alkaline phosphatase 24, albumin 4.2, AST 29, ALT, total protein 8.0 white count 5.3, hemoglobin 12.4, hematocrit 37.2, MCV 88.6, RDW 13.2, platelet count 255, PT 12.7, INR 1.0 CT scan of the head without contrast shows scattered subcortical white matter hypoattenuation is most evident adjacent to the basal ganglia and operculum bilaterally.This likely reflects the sequela of chronic microvascular  ischemia. No acute cortical abnormality. MRI of the brain shows an acute left lacunar infarct MRA does not show any evidence of blood vessel occlusion.  Mild atherosclerosis in the neck but no evidence of stenosis Twelve-lead EKG reviewed by me showed sinus rhythm    ED Course: Patient is a 79 year old Caucasian female who presented to the ER for evaluation of right-sided facial droop and found to have an acute left lacunar infarct.  Patient was loaded with Plavix in the ER because she has allergies to salicylates.  She will be admitted to the hospital for further evaluation.  Review of Systems: As per HPI otherwise 10 point review of systems negative.    Past Medical History:  Diagnosis Date  . Acne rosacea   . Allergic rhinitis   . Chronic kidney disease    kidney stones, Nov. 2013- tx with Lithotripsy, still has some present   . Cough variant asthma   . Degenerative disc disease    knees, ankles, back- OA  . GERD (gastroesophageal reflux disease)   . Headache(784.0)    since stopping aleve- 07/30/2013  . Hyperlipemia    dyslipidemia  . Hypertension    [facility] pharmacist follows pt. Riki Rusk Smart ,has never taken anti-hypertensive  . Osteoarthritis   . Plantar fasciitis    (using orthotics-they are wearing out)- Dr Luther Bradley  . Pneumonia    double pneumonia - relative to sinus infection , had chemical cauterization   . PONV (postoperative nausea and vomiting)    urgency, N&V for days, memory & processing  . Sleep apnea  Bringing cpap mask and tubing, study last done- 2006    Past Surgical History:  Procedure Laterality Date  . ABDOMINAL HYSTERECTOMY  1990's  . BLADDER REPAIR    . BLADDER SUSPENSION  90's  . MAXIMUM ACCESS (MAS)POSTERIOR LUMBAR INTERBODY FUSION (PLIF) 2 LEVEL N/A 08/12/2013   Procedure: FOR MAXIMUM ACCESS (MAS) POSTERIOR LUMBAR INTERBODY FUSION (PLIF) 2 LEVEL;  Surgeon: Tia Alertavid S Jones, MD;  Location: [facility];  Service: Neurosurgery;   Laterality: N/A;  FOR MAXIMUM ACCESS (MAS) POSTERIOR LUMBAR INTERBODY FUSION (PLIF) 2 LEVEL  . MULTIPLE TOOTH EXTRACTIONS     wisdom teeth extractions   . POSTERIOR FUSION LUMBAR SPINE  08/12/2013   Dr Yetta BarreJones  . TONSILLECTOMY       reports that she has never smoked. She has never used smokeless tobacco. She reports current alcohol use. She reports that she does not use drugs.  Allergies  Allergen Reactions  . Other Other (See Comments)    Anesthesia causes nausea and vomiting, memory difficulty and problems processing.  . Oxycodone Nausea Only  . Sulfa Antibiotics Rash  . Sulfasalazine Rash    No family history on file.   Prior to Admission medications   Medication Sig Start Date End Date Taking? Authorizing Provider  acetaminophen (TYLENOL) 500 MG tablet Take 500 mg by mouth every 6 (six) hours as needed for pain.    [provider]  Cholecalciferol (VITAMIN D) 2000 UNITS tablet Take 2,000 Units by mouth daily.    [provider]  fenofibrate (TRICOR) 145 MG tablet Take 1 tablet by mouth daily, need MD visit for further refills 04/15/16   Alvstad, Kristin L, RPH-CPP  fluticasone (FLONASE) 50 MCG/ACT nasal spray Place 2 sprays into the nose daily as needed. Allergic rhinitis.    [provider]  Glucosamine-Chondroitin (OSTEO BI-FLEX REGULAR STRENGTH PO) Take 1 tablet by mouth 2 (two) times daily.    [provider]  magnesium oxide (MAG-OX) 400 MG tablet Take 400 mg by mouth 2 (two) times daily.    [provider]  meloxicam (MOBIC) 15 MG tablet Take 15 mg by mouth daily.    [provider]  mirabegron ER (MYRBETRIQ) 25 MG TB24 tablet Take 25 mg by mouth daily.    [provider]  Multiple Vitamin (MULTIVITAMIN WITH MINERALS) TABS Take 1 tablet by mouth daily.    [provider]  Omega 3-6-9 Fatty Acids (GNP TRIPLE OMEGA COMPLEX) CPDR Take 2 tablets by mouth daily. 10/27/13   Quintella Reicherturner, Traci R, MD  pravastatin  (PRAVACHOL) 20 MG tablet TK 1 T PO QD 08/24/18   [provider]  tretinoin (RETIN-A) 0.05 % cream Apply 1 application topically 2 (two) times daily.    [provider]  valsartan-hydrochlorothiazide (DIOVAN-HCT) 80-12.5 MG tablet TK 1 T PO QD 09/14/18   [provider]  vitamin B-12 (CYANOCOBALAMIN) 1000 MCG tablet Take 1,000 mcg by mouth daily.    [provider]    Physical Exam: Vitals:   11/06/20 0945 11/06/20 1000 11/06/20 1137 11/06/20 1212  BP:  (!) 171/83 (!) 159/71 (!) 167/71  Pulse: 97  85 92  Resp: 16 (!) 21 18 14   Temp:      TempSrc:      SpO2: 98%  96% 96%  Weight:      Height:         Vitals:   11/06/20 0945 11/06/20 1000 11/06/20 1137 11/06/20 1212  BP:  (!) 171/83 (!) 159/71 (!) 167/71  Pulse: 97  85 92  Resp: 16 (!) 21 18 14   Temp:      TempSrc:      SpO2: 98%  96% 96%  Weight:      Height:        Constitutional: NAD, alert and oriented x 3 Eyes: PERRL, lids and conjunctivae normal ENMT: Mucous membranes are moist.  Neck: normal, supple, no masses, no thyromegaly Respiratory: clear to auscultation bilaterally, no wheezing, no crackles. Normal respiratory effort. No accessory muscle use.  Cardiovascular: Regular rate and rhythm, no murmurs / rubs / gallops. No extremity edema. 2+ pedal pulses. No carotid bruits.  Abdomen: no tenderness, no masses palpated. No hepatosplenomegaly. Bowel sounds positive.  Central adiposity Musculoskeletal: no clubbing / cyanosis. No joint deformity upper and lower extremities.  Skin: no rashes, lesions, ulcers.  Neurologic: No gross focal neurologic deficit.  Able to move all extremities Psychiatric: Normal mood and affect.   Labs on Admission: I have personally reviewed following labs and imaging studies  CBC: Recent Labs  Lab 11/06/20 0938  WBC 5.3  NEUTROABS 3.1  HGB 12.4  HCT 37.2  MCV 88.6  PLT 255   Basic Metabolic Panel: Recent Labs  Lab 11/06/20 0938  NA 139  K 4.1    CL 104  CO2 24  GLUCOSE 137*  BUN 25*  CREATININE 1.25*  CALCIUM 9.4   GFR: Estimated Creatinine Clearance: 36.6 mL/min (A) (by C-G formula based on SCr of 1.25 mg/dL (H)). Liver Function Tests: Recent Labs  Lab 11/06/20 0938  AST 29  ALT 22  ALKPHOS 24*  BILITOT 0.6  PROT 8.0  ALBUMIN 4.2   No results for input(s): LIPASE, AMYLASE in the last 168 hours. No results for input(s): AMMONIA in the last 168 hours. Coagulation Profile: Recent Labs  Lab 11/06/20 0938  INR 1.0   Cardiac Enzymes: No results for input(s): CKTOTAL, CKMB, CKMBINDEX, TROPONINI in the last 168 hours. BNP (last 3 results) No results for input(s): PROBNP in the last 8760 hours. HbA1C: No results for input(s): HGBA1C in the last 72 hours. CBG: No results for input(s): GLUCAP in the last 168 hours. Lipid Profile: No results for input(s): CHOL, HDL, LDLCALC, TRIG, CHOLHDL, LDLDIRECT in the last 72 hours. Thyroid Function Tests: No results for input(s): TSH, T4TOTAL, FREET4, T3FREE, THYROIDAB in the last 72 hours. Anemia Panel: No results for input(s): VITAMINB12, FOLATE, FERRITIN, TIBC, IRON, RETICCTPCT in the last 72 hours. Urine analysis:    Component Value Date/Time   COLORURINE YELLOW (A) 11/06/2020 1032   APPEARANCEUR CLEAR (A) 11/06/2020 1032   LABSPEC 1.015 11/06/2020 1032   PHURINE 6.0 11/06/2020 1032   GLUCOSEU NEGATIVE 11/06/2020 1032   HGBUR NEGATIVE 11/06/2020 1032   BILIRUBINUR NEGATIVE 11/06/2020 1032   KETONESUR NEGATIVE 11/06/2020 1032   PROTEINUR NEGATIVE 11/06/2020 1032   UROBILINOGEN 0.2 11/05/2012 2114   NITRITE NEGATIVE 11/06/2020 1032   LEUKOCYTESUR NEGATIVE 11/06/2020 1032    Radiological Exams on Admission: CT HEAD CODE STROKE WO CONTRAST  Result Date: 11/06/2020 CLINICAL DATA:  Code stroke. New onset of slurred speech and right-sided facial droop beginning this morning. Unknown last seen well. EXAM: CT HEAD WITHOUT CONTRAST TECHNIQUE: Contiguous axial images were  obtained from the base of the skull through the vertex without intravenous contrast. COMPARISON:  None. FINDINGS: Brain: Scattered subcortical white matter hypoattenuation is most evident adjacent to the basal ganglia and in the operculum bilaterally. No acute cortical abnormality is present. Insular ribbon is normal bilaterally. Basal ganglia  are normal. No acute hemorrhage or mass lesion is present. The ventricles are of normal size. No significant extraaxial fluid collection is present. The brainstem and cerebellum are within normal limits. Vascular: Atherosclerotic calcifications are present within the cavernous internal carotid arteries bilaterally and at the V3 segments of the vertebral arteries. No hyperdense vessel is present. Skull: Calvarium is intact. No focal lytic or blastic lesions are present. No significant extracranial soft tissue lesion is present. Sinuses/Orbits: The paranasal sinuses and mastoid air cells are clear. Bilateral lens replacements are noted. Globes and orbits are otherwise unremarkable. ASPECTS (Alberta Stroke Program Early CT Score) - Ganglionic level infarction (caudate, lentiform nuclei, internal capsule, insula, M1-M3 cortex): 7/7 - Supraganglionic infarction (M4-M6 cortex): 3/3 Total score (0-10 with 10 being normal): 10/10 IMPRESSION: 1. Scattered subcortical white matter hypoattenuation is most evident adjacent to the basal ganglia and operculum bilaterally. 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 2. No acute cortical abnormality. 3. ASPECTS is 10/10. The above was relayed via text pager to Dr. Iver Nestle on 11/06/2020 at 09:54 . Electronically Signed   By: Marin Roberts M.D.   On: 11/06/2020 09:54     EKG: Independently reviewed.  Sinus rhythm  Assessment/Plan Principal Problem:   Acute CVA (cerebrovascular accident) (HCC) Active Problems:   Chronic kidney disease   Hypertension   Hyperlipemia     Acute CVA Patient presents for  evaluation of right facial droop CT scan of the head without contrast does not show an acute bleed MRI of the brain shows an acute left lacunar infarct We will obtain 2D echocardiogram to rule out cardiac problems Continue Plavix and high intensity statin (patient is allergic to salicylates) We will allow for permissive hypertension Obtain PT/ST/OT consult Consult neurology    Hypertension Hold hydrochlorothiazide and valsartan   History of chronic kidney disease Most likely secondary to hypertension Renal function is stable     DVT prophylaxis: SCD Code Status: Full code Family Communication: Greater than 50% of time was spent discussing plan of care with patient at the bedside.  All questions and concerns have been addressed.  She verbalizes understanding and agrees with the plan. Disposition Plan: Back to previous home environment Consults called: Neurology    Lucile Shutters MD [facility]     11/06/2020, 12:29 PM

## 2020-11-06 NOTE — ED Notes (Signed)
Pt transported to MRI with this RN and neurologist.

## 2020-11-06 NOTE — ED Provider Notes (Signed)
[facility] Emergency Department Provider Note   ____________________________________________   First MD Initiated Contact with Patient 11/06/20 [phone]     (approximate)  I have reviewed the triage vital signs and the nursing notes.   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  Patient states that she went to talk to her dog about 30 minutes prior to arrival and noticed that her speech "seemed off."  She then went to brush her teeth and looked in the mirror, saw that the right side of her face seemed to be drooping.  She is not sure exactly when symptoms started, states she went to bed feeling fine last night but had not seen her face this morning until about 30 minutes prior to arrival.  She denies any vision changes, numbness or weakness in her extremities.  She has otherwise been feeling well with no fevers, cough, chest pain, shortness of breath, vomiting, or diarrhea.        Past Medical History:  Diagnosis Date  . Acne rosacea   . Allergic rhinitis   . Chronic kidney disease    kidney stones, Nov. 2013- tx with Lithotripsy, still has some present   . Cough variant asthma   . Degenerative disc disease    knees, ankles, back- OA  . GERD (gastroesophageal reflux disease)   . Headache(784.0)    since stopping aleve- 07/30/2013  . Hyperlipemia    dyslipidemia  . Hypertension    [facility] cardiac pharmacist follows pt. Riki Rusk Smart ,has never taken anti-hypertensive  . Osteoarthritis   . Plantar fasciitis    (using orthotics-they are wearing out)- Dr Luther Bradley  . Pneumonia    double pneumonia - relative to sinus infection , had chemical cauterization   . PONV (postoperative nausea and vomiting)    urgency, N&V for days, memory & processing  . Sleep apnea    Bringing cpap mask and tubing, study last done-  2006    Patient Active Problem List   Diagnosis Date Noted  . Ingrown toenail 09/26/2018  . Acute non-recurrent maxillary sinusitis 01/05/2018  . Spinal stenosis of lumbar region 09/20/2013  . Pure hypercholesterolemia 09/20/2013  . Allergic rhinitis, cause unspecified 09/20/2013  . Encounter for long-term (current) use of other medications 09/20/2013    Past Surgical History:  Procedure Laterality Date  . ABDOMINAL HYSTERECTOMY  1990's  . BLADDER REPAIR    . BLADDER SUSPENSION  90's  . MAXIMUM ACCESS (MAS)POSTERIOR LUMBAR INTERBODY FUSION (PLIF) 2 LEVEL N/A 08/12/2013   Procedure: FOR MAXIMUM ACCESS (MAS) POSTERIOR LUMBAR INTERBODY FUSION (PLIF) 2 LEVEL;  Surgeon: Tia Alert, MD;  Location: [facility];  Service: Neurosurgery;  Laterality: N/A;  FOR MAXIMUM ACCESS (MAS) POSTERIOR LUMBAR INTERBODY FUSION (PLIF) 2 LEVEL  . MULTIPLE TOOTH EXTRACTIONS     wisdom teeth extractions   . POSTERIOR FUSION LUMBAR SPINE  08/12/2013   Dr Yetta Barre  . TONSILLECTOMY      Prior to Admission medications   Medication Sig Start Date End Date Taking? Authorizing Provider  acetaminophen (TYLENOL) 500 MG tablet Take 500 mg by mouth every 6 (six) hours as needed for pain.    [provider]  Cholecalciferol (VITAMIN D) 2000 UNITS tablet Take 2,000 Units by mouth daily.    [provider]  fenofibrate (TRICOR) 145 MG tablet Take  1 tablet by mouth daily, need MD visit for further refills 04/15/16   Alvstad, Kristin L, RPH-CPP  fluticasone (FLONASE) 50 MCG/ACT nasal spray Place 2 sprays into the nose daily as needed. Allergic rhinitis.    [provider]  Glucosamine-Chondroitin (OSTEO BI-FLEX REGULAR STRENGTH PO) Take 1 tablet by mouth 2 (two) times daily.    [provider]  magnesium oxide (MAG-OX) 400 MG tablet Take 400 mg by mouth 2 (two) times daily.    [provider]  meloxicam (MOBIC) 15 MG tablet Take 15 mg by mouth daily.    [provider]    mirabegron ER (MYRBETRIQ) 25 MG TB24 tablet Take 25 mg by mouth daily.    [provider]  Multiple Vitamin (MULTIVITAMIN WITH MINERALS) TABS Take 1 tablet by mouth daily.    [provider]  Omega 3-6-9 Fatty Acids (GNP TRIPLE OMEGA COMPLEX) CPDR Take 2 tablets by mouth daily. 10/27/13   Quintella Reichert, MD  pravastatin (PRAVACHOL) 20 MG tablet TK 1 T PO QD 08/24/18   [provider]  tretinoin (RETIN-A) 0.05 % cream Apply 1 application topically 2 (two) times daily.    [provider]  valsartan-hydrochlorothiazide (DIOVAN-HCT) 80-12.5 MG tablet TK 1 T PO QD 09/14/18   [provider]  vitamin B-12 (CYANOCOBALAMIN) 1000 MCG tablet Take 1,000 mcg by mouth daily.    [provider]    Allergies Other, Oxycodone, Sulfa antibiotics, and Sulfasalazine  No family history on file.  Social History Social History   Tobacco Use  . Smoking status: Never Smoker  . Smokeless tobacco: Never Used  Substance Use Topics  . Alcohol use: Yes    Comment: on occassion  . Drug use: No    Review of Systems  Constitutional: No fever/chills Eyes: No visual changes. ENT: No sore throat. Cardiovascular: Denies chest pain. Respiratory: Denies shortness of breath. Gastrointestinal: No abdominal pain.  No nausea, no vomiting.  No diarrhea.  No constipation. Genitourinary: Negative for dysuria. Musculoskeletal: Negative for back pain. Skin: Negative for rash. Neurological: Negative for headaches, focal weakness or numbness.  Positive for facial droop and aphasia.  ____________________________________________   PHYSICAL EXAM:  VITAL SIGNS: ED Triage Vitals  Enc Vitals Group     BP --      Pulse --      Resp --      Temp --      Temp src --      SpO2 --      Weight 11/06/20 0933 179 lb (81.2 kg)     Height 11/06/20 0933 5\' 2"  (1.575 m)     Head Circumference --      Peak Flow --      Pain Score 11/06/20 0934 0     Pain Loc --      Pain  Edu? --      Excl. in GC? --     Constitutional: Alert and oriented. Eyes: Conjunctivae are normal. Head: Atraumatic. Nose: No congestion/rhinnorhea. Mouth/Throat: Mucous membranes are moist. Neck: Normal ROM Cardiovascular: Normal rate, regular rhythm. Grossly normal heart sounds. Respiratory: Normal respiratory effort.  No retractions. Lungs CTAB. Gastrointestinal: Soft and nontender. No distention. Genitourinary: deferred Musculoskeletal: No lower extremity tenderness nor edema. Neurologic:  Normal speech and language, no aphasia noted.  Right-sided facial droop noted that does not involve upper face.  Strength 5 out of 5 in bilateral upper and lower extremities with no pronator drift.  Sensation grossly intact. Skin:  Skin  is warm, dry and intact. No rash noted. Psychiatric: Mood and affect are normal. Speech and behavior are normal.  ____________________________________________   LABS (all labs ordered are listed, but only abnormal results are displayed)  Labs Reviewed  COMPREHENSIVE METABOLIC PANEL - Abnormal; Notable for the following components:      Result Value   Glucose, Bld 137 (*)    BUN 25 (*)    Creatinine, Ser 1.25 (*)    Alkaline Phosphatase 24 (*)    GFR, Estimated 44 (*)    All other components within normal limits  URINE DRUG SCREEN, QUALITATIVE ([facility] ONLY) - Abnormal; Notable for the following components:   MDMA (Ecstasy)Ur Screen POSITIVE (*)    All other components within normal limits  URINALYSIS, ROUTINE W REFLEX MICROSCOPIC - Abnormal; Notable for the following components:   Color, Urine YELLOW (*)    APPearance CLEAR (*)    All other components within normal limits  RESPIRATORY PANEL BY RT PCR (FLU A&B, COVID)  CBC  DIFFERENTIAL  PROTIME-INR  APTT   ____________________________________________  EKG  ED ECG REPORT I, Chesley Noon, the attending physician, personally viewed and interpreted this ECG.   Date: 11/06/2020  EKG Time: 9:38   Rate: 95  Rhythm: normal sinus rhythm  Axis: Normal  Intervals:none  ST&T Change: None   PROCEDURES  Procedure(s) performed (including Critical Care):  Procedures   ____________________________________________   INITIAL IMPRESSION / ASSESSMENT AND PLAN / ED COURSE       79 year old female with past medical history of hypertension hyperlipidemia presents to the ED complaining of right-sided facial droop and abnormal speech pattern that she noticed about 30 minutes prior to arrival.  Exact time of onset is unclear as patient had not seen herself in the mirror earlier today but patient states she was feeling fine when she went to bed last night.  Code stroke was activated, do not suspect Bell's palsy as there is no involvement of her upper face.  Patient evaluated by neurology and she was taken to MRI, which confirmed lacunar stroke.  No acute intervention at this time as she is outside the window for TPA and no evidence of large vessel occlusion.  Lab work is unremarkable and we will allow for permissive hypertension.  Case discussed with hospitalist for admission.      ____________________________________________   FINAL CLINICAL IMPRESSION(S) / ED DIAGNOSES  Final diagnoses:  Cerebrovascular accident (CVA), unspecified mechanism (HCC)  Facial droop     ED Discharge Orders    None       Note:  This document was prepared using Dragon voice recognition software and may include unintentional dictation errors.   Chesley Noon, MD 11/06/20 1139

## 2020-11-07 ENCOUNTER — Observation Stay (HOSPITAL_BASED_OUTPATIENT_CLINIC_OR_DEPARTMENT_OTHER)
Admit: 2020-11-07 | Discharge: 2020-11-07 | Disposition: A | Discharge status: Home / Self Care | Payer: Medicare Other | Attending: Internal Medicine | Admitting: Internal Medicine

## 2020-11-07 ENCOUNTER — Other Ambulatory Visit: Payer: Self-pay | Admitting: Nurse Practitioner

## 2020-11-07 ENCOUNTER — Ambulatory Visit (INDEPENDENT_AMBULATORY_CARE_PROVIDER_SITE_OTHER): Payer: Medicare Other

## 2020-11-07 ENCOUNTER — Observation Stay: Payer: Medicare Other

## 2020-11-07 ENCOUNTER — Telehealth: Payer: Self-pay

## 2020-11-07 DIAGNOSIS — I639 Cerebral infarction, unspecified: Secondary | ICD-10-CM

## 2020-11-07 DIAGNOSIS — I6389 Other cerebral infarction: Secondary | ICD-10-CM

## 2020-11-07 DIAGNOSIS — N183 Chronic kidney disease, stage 3 unspecified: Secondary | ICD-10-CM | POA: Diagnosis not present

## 2020-11-07 DIAGNOSIS — I6381 Other cerebral infarction due to occlusion or stenosis of small artery: Secondary | ICD-10-CM | POA: Diagnosis not present

## 2020-11-07 LAB — HEMOGLOBIN A1C
Hgb A1c MFr Bld: 6.1 % — ABNORMAL HIGH (ref 4.8–5.6)
Mean Plasma Glucose: 128.37 mg/dL

## 2020-11-07 LAB — ECHOCARDIOGRAM COMPLETE
AR max vel: 1.57 cm2
AV Area VTI: 1.69 cm2
AV Area mean vel: 1.42 cm2
AV Mean grad: 8 mmHg
AV Peak grad: 12.8 mmHg
Ao pk vel: 1.79 m/s
Area-P 1/2: 3.24 cm2
Height: 62 in
S' Lateral: 3.67 cm
Weight: 2864 oz

## 2020-11-07 LAB — LIPID PANEL
Cholesterol: 177 mg/dL (ref 0–200)
HDL: 47 mg/dL (ref >40)
LDL Cholesterol: 87 mg/dL (ref 0–99)
Total CHOL/HDL Ratio: 3.8 RATIO
Triglycerides: 215 mg/dL — ABNORMAL HIGH (ref <150)
VLDL: 43 mg/dL — ABNORMAL HIGH (ref 0–40)

## 2020-11-07 IMAGING — CT CT HEAD W/O CM
3 series · 16 of 47 positions shown, 19 images · non-contrast
Comparison: MRI head [DATE].  CT head [DATE]

CLINICAL DATA: Stroke.

EXAM:
CT HEAD WITHOUT CONTRAST
TECHNIQUE: Contiguous axial images were obtained from the base of the skull
through the vertex without intravenous contrast.

[Series 3: head wo · axial · 0.42mm/px · z∈[-108,+17]mm · 10 of 31 slices shown, 13 images]
[im 3/31  brain]
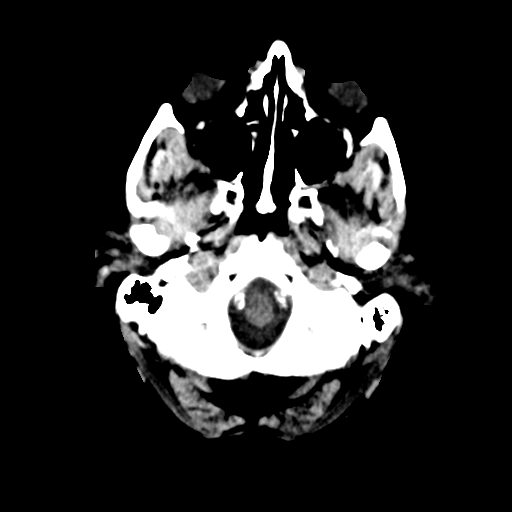
[im 3/31  bone]
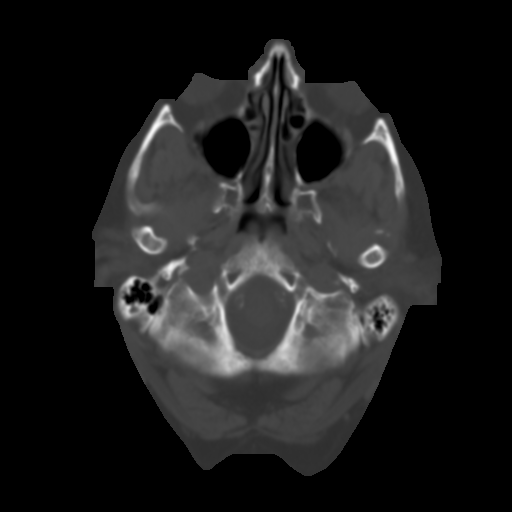
[im 6/31  brain]
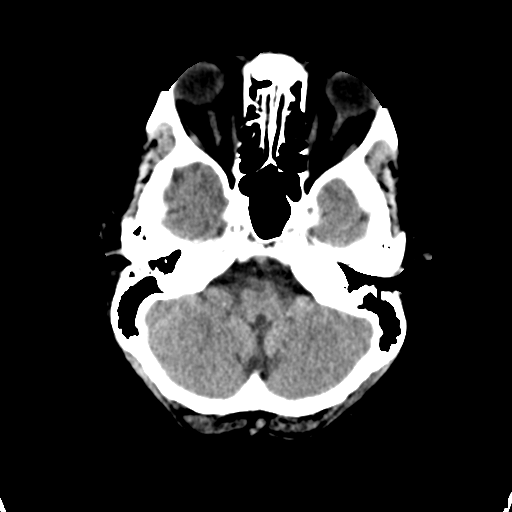
[im 9/31  brain]
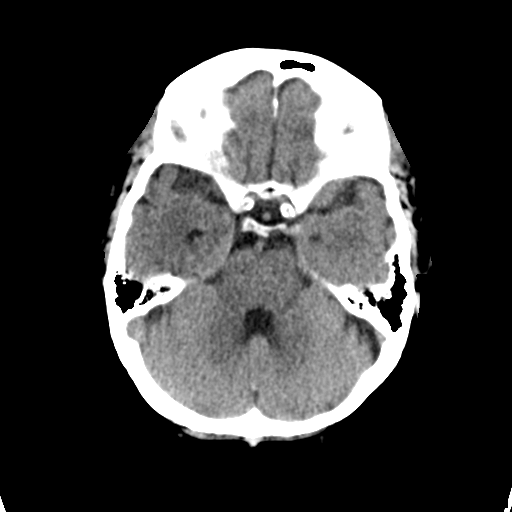
[im 11/31  brain]
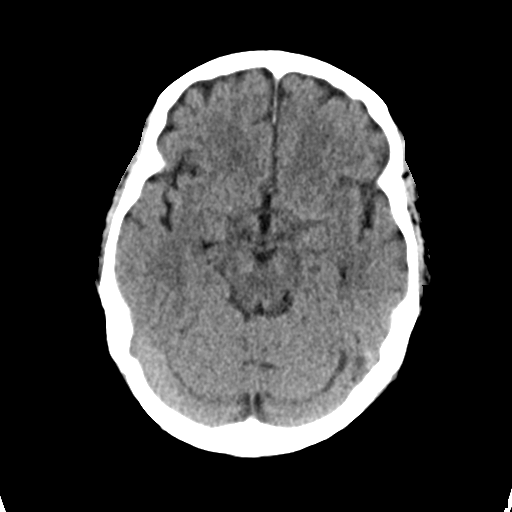
[im 14/31  brain]
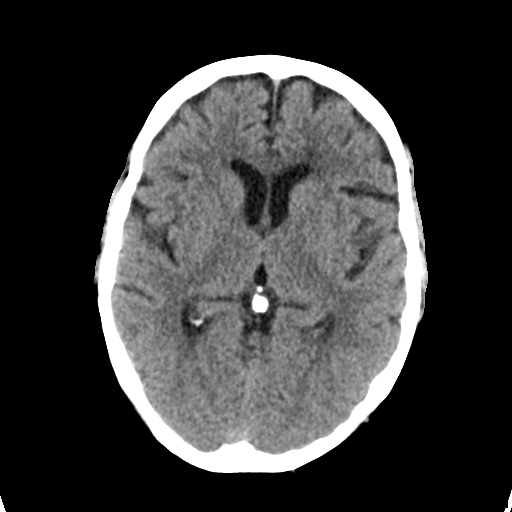
[im 14/31  bone]
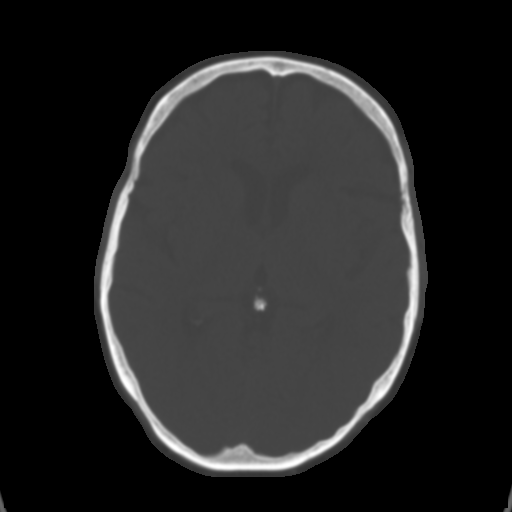
[im 17/31  brain]
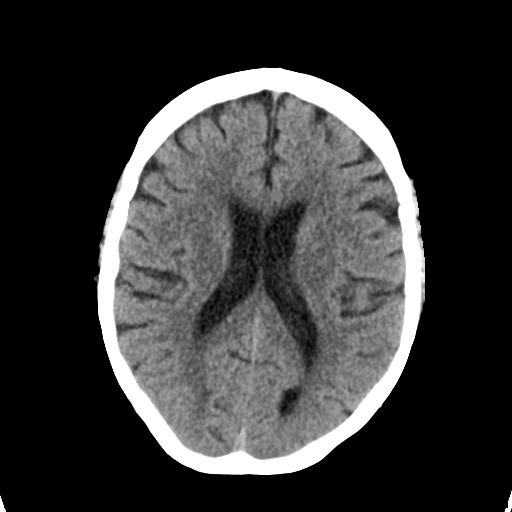
[im 20/31  brain]
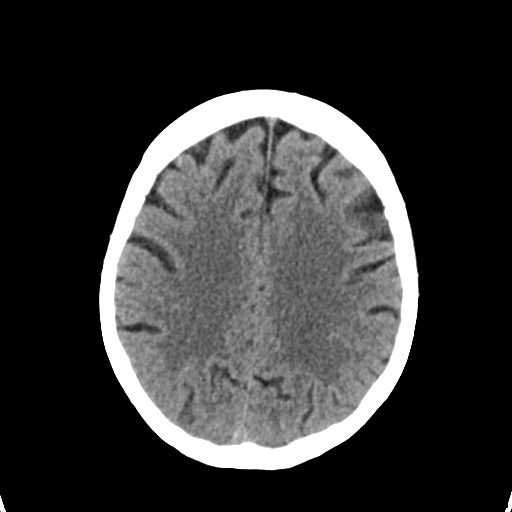
[im 23/31  brain]
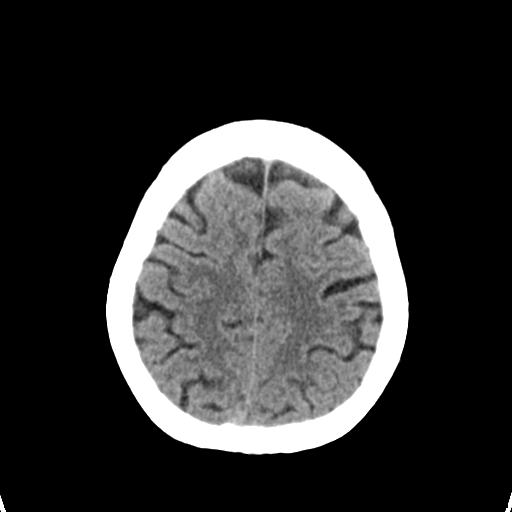
[im 25/31  brain]
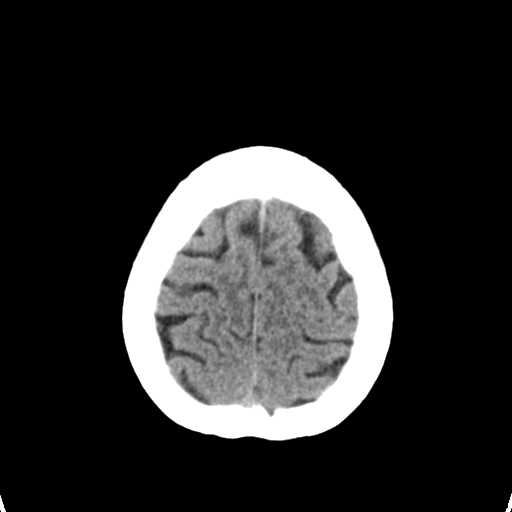
[im 25/31  bone]
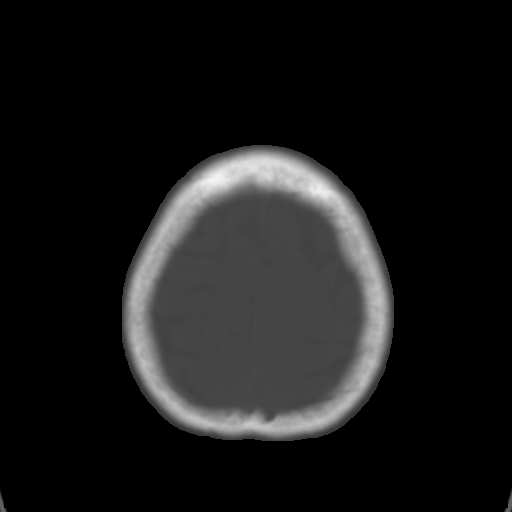
[im 28/31  brain]
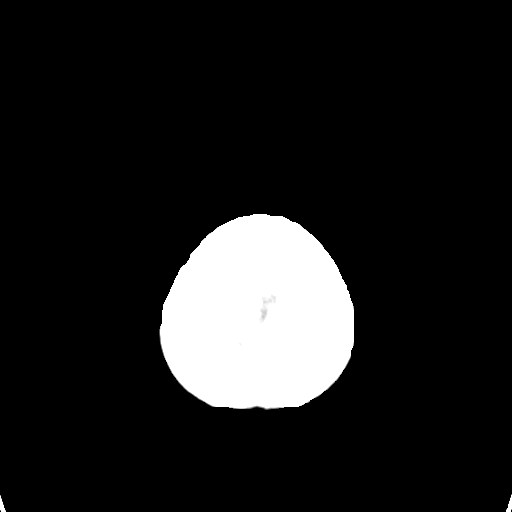

[Series 4: coronal soft tissue · coronal · 0.32mm/px · 3 of 64 slices shown]
[im 22/64  brain]
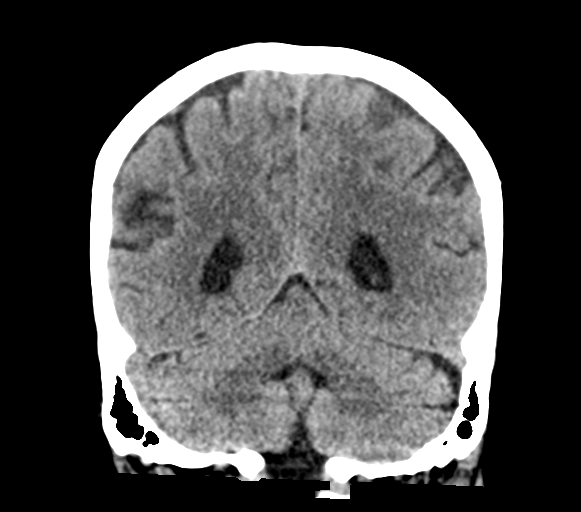
[im 29/64  brain]
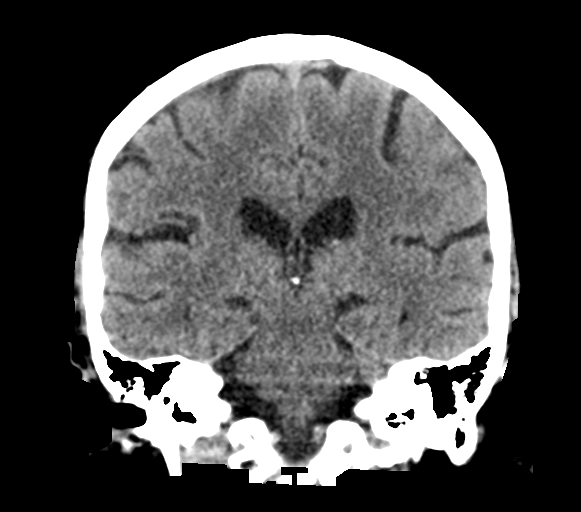
[im 36/64  brain]
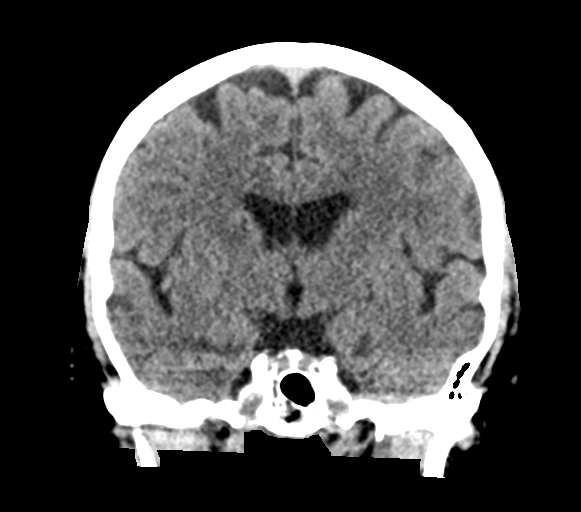

[Series 5: sagittal soft tissue · sagittal · 0.34mm/px · 3 of 54 slices shown]
[im 18/54  brain]
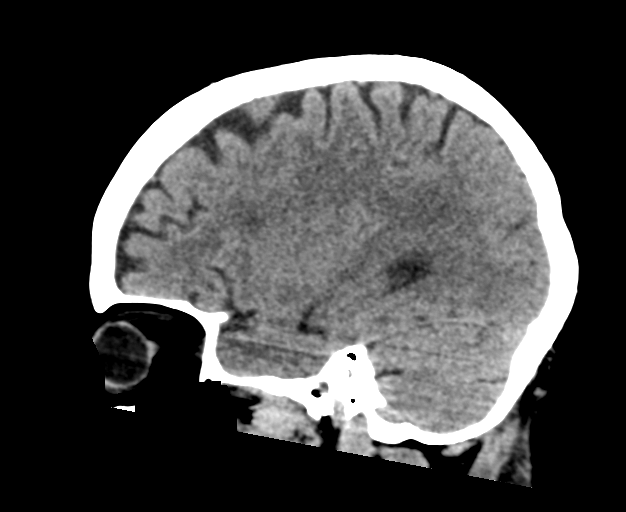
[im 27/54  brain]
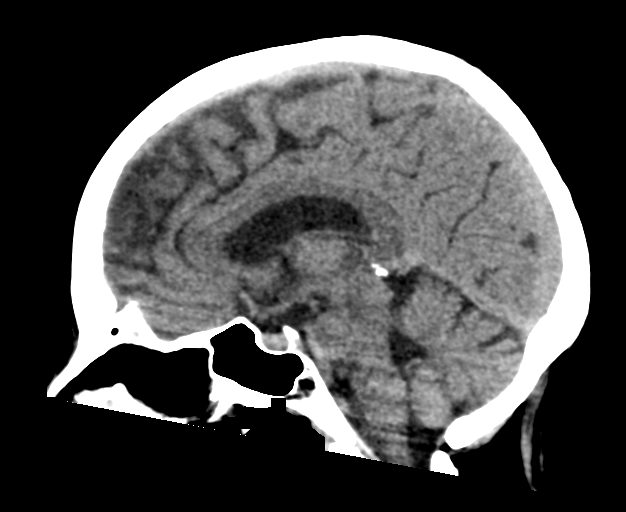
[im 36/54  brain]
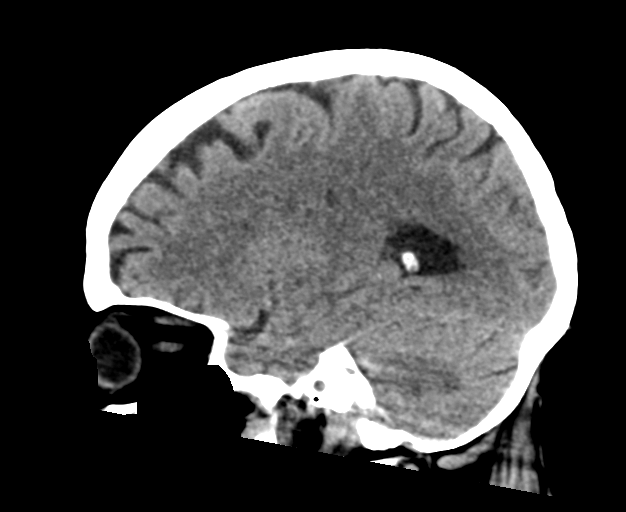

[16 of 47 positions shown; findings below may reference images not displayed]

FINDINGS: Brain: Subtle hypodensity in the left corona radiata compatible with
acute infarct as noted on diffusion-weighted imaging . This has
developed since the CT yesterday.

Negative for acute hemorrhage or mass.  Ventricle size normal.

Vascular: Normal arterial flow voids.

Skull: Negative

Sinuses/Orbits: Paranasal sinuses clear. Bilateral cataract
extraction.

Other: None
IMPRESSION: Developing hypodensity left corona radiata compatible with acute
infarct as noted on diffusion-weighted imaging. Negative for
hemorrhage.

## 2020-11-07 MED ORDER — ASPIRIN EC 81 MG PO TBEC
81.0000 mg | DELAYED_RELEASE_TABLET | Freq: Every day | ORAL | Status: DC
  Administered 2020-11-07: 10:00:00 81 mg via ORAL
  Filled 2020-11-07: qty 1

## 2020-11-07 MED ORDER — PERFLUTREN LIPID MICROSPHERE
1.0000 mL | INTRAVENOUS | Status: AC | PRN
  Administered 2020-11-07: 2 mL via INTRAVENOUS
  Filled 2020-11-07: qty 10

## 2020-11-07 MED ORDER — ASPIRIN 81 MG PO TBEC
81.0000 mg | DELAYED_RELEASE_TABLET | Freq: Every day | ORAL | 0 refills | Status: DC
Start: 2020-11-08 — End: 2020-12-17

## 2020-11-07 MED ORDER — ATORVASTATIN CALCIUM 40 MG PO TABS
40.0000 mg | ORAL_TABLET | Freq: Every day | ORAL | 0 refills | Status: DC
Start: 2020-11-07 — End: 2022-09-02

## 2020-11-07 MED ORDER — CLOPIDOGREL BISULFATE 75 MG PO TABS
75.0000 mg | ORAL_TABLET | Freq: Every day | ORAL | 0 refills | Status: DC
Start: 2020-11-07 — End: 2022-09-02

## 2020-11-07 NOTE — Discharge Summary (Signed)
Physician Discharge Summary  [name], Dr. ZOX:[id number] DOB: 09-Sep-1941 DOA: 11/06/2020  PCP: Merlene Laughter, MD  Admit date: 11/06/2020 Discharge date: 11/07/2020  Admitted From: Home-independent living facility Disposition: Home  Recommendations for Outpatient Follow-up:  1. Follow up with PCP in 1-2 weeks 2. Please obtain BMP/CBC in one week Please follow up on the following pending results: None  Home Health: No  Equipment/Devices: None Discharge Condition: Stable CODE STATUS: Full Diet recommendation: Heart Healthy/carb modified.  Brief/Interim Summary: [name], Dr. is a 79 y.o. female with medical history significant for hypertension, dyslipidemia, obstructive sleep apnea, stage III chronic kidney disease who presented to the ER for evaluation of right facial droop and slurred speech. Patient last known well was the night prior to admission.  She woke up about 6:30 AM and within the next hour while she was talking to her dog she felt she sounded funny like her speech was slurred.  She subsequently looked in the mirror and noticed right-sided facial droop.  She called EMS and was brought into the ER as a code stroke.    On arrival to ED most of her symptoms resolved except mild dysarthria.  Initial CT head was without any acute abnormalities and consistent with microvascular disease.  MRI with acute left lacunar stroke.  UDS was positive for ecstasy declined any use.  She does not smoke.  A1c at 6.1 which makes her prediabetic.  Patient regularly follow-up with her primary care provider and is a retired Warden/ranger. Echocardiogram was within normal limits. She was provided with event monitor for 1 month before discharge. She was also given referral to see a speech therapist for persistent mild dysarthria.  She was asked to follow-up with a neurologist and she would like her primary care provider to place referral.  She has an appointment with her PCP on Friday.  No  other residual deficit. She will continue with aspirin and Plavix for 3 weeks and then only with Plavix. She was also started on Lipitor.  She will continue with her home meds and follow-up with her primary care provider.   Discharge Diagnoses:  Principal Problem:   Acute CVA (cerebrovascular accident) [facility]) Active Problems:   Chronic kidney disease   Hypertension   Hyperlipemia  Discharge Instructions  Discharge Instructions    Ambulatory referral to Neurology   Complete by: As directed    An appointment is requested in approximately: 2 weeks   Ambulatory referral to Speech Therapy   Complete by: As directed    Diet - low sodium heart healthy   Complete by: As directed    Discharge instructions   Complete by: As directed    It was pleasure taking care of you. You are being started on aspirin, Plavix and Lipitor to prevent further stroke. Please take aspirin and Plavix together for 3 weeks and then continue with Plavix only. You are also being provided with cardiac monitor, please follow the directions. Follow-up with your primary care provider and neurologist.   Increase activity slowly   Complete by: As directed      Allergies as of 11/07/2020      Reactions   Other Other (See Comments)   Anesthesia causes nausea and vomiting, memory difficulty and problems processing.   Oxycodone Nausea Only   Sulfa Antibiotics Rash   Sulfasalazine Rash      Medication List    STOP taking these medications   pravastatin 20 MG tablet Commonly known as: PRAVACHOL  TAKE these medications   acetaminophen 500 MG tablet Commonly known as: TYLENOL Take 500 mg by mouth every 6 (six) hours as needed for pain.   aspirin 81 MG EC tablet Take 1 tablet (81 mg total) by mouth daily. Swallow whole. Start taking on: November 08, 2020   atorvastatin 40 MG tablet Commonly known as: LIPITOR Take 1 tablet (40 mg total) by mouth at bedtime.   clopidogrel 75 MG tablet Commonly known  as: PLAVIX Take 1 tablet (75 mg total) by mouth daily.   fenofibrate 145 MG tablet Commonly known as: TRICOR Take 1 tablet by mouth daily, need MD visit for further refills   fluticasone 50 MCG/ACT nasal spray Commonly known as: FLONASE Place 2 sprays into the nose daily as needed. Allergic rhinitis.   GNP Triple Omega Complex Cpdr Take 2 tablets by mouth daily.   magnesium oxide 400 MG tablet Commonly known as: MAG-OX Take 400 mg by mouth 2 (two) times daily.   meloxicam 15 MG tablet Commonly known as: MOBIC Take 15 mg by mouth daily.   multivitamin with minerals Tabs tablet Take 1 tablet by mouth daily.   Myrbetriq 25 MG Tb24 tablet Generic drug: mirabegron ER Take 25 mg by mouth daily.   OSTEO BI-FLEX REGULAR STRENGTH PO Take 1 tablet by mouth 2 (two) times daily.   tretinoin 0.05 % cream Commonly known as: RETIN-A Apply 1 application topically 2 (two) times daily.   valsartan-hydrochlorothiazide 80-12.5 MG tablet Commonly known as: DIOVAN-HCT TK 1 T PO QD   vitamin B-12 1000 MCG tablet Commonly known as: CYANOCOBALAMIN Take 1,000 mcg by mouth daily.   Vitamin D 50 MCG (2000 UT) tablet Take 2,000 Units by mouth daily.       Follow-up Information    Stoneking, Ann Maki, MD. Schedule an appointment as soon as possible for a visit.   Specialty: Internal Medicine Contact information: [address] [place] [address] [phone]              Allergies  Allergen Reactions  . Other Other (See Comments)    Anesthesia causes nausea and vomiting, memory difficulty and problems processing.  . Oxycodone Nausea Only  . Sulfa Antibiotics Rash  . Sulfasalazine Rash    Consultations:  Neurology  Procedures/Studies: CT HEAD WO CONTRAST  Result Date: 11/07/2020 CLINICAL DATA:  Stroke. EXAM: CT HEAD WITHOUT CONTRAST TECHNIQUE: Contiguous axial images were obtained from the base of the skull through the vertex without intravenous contrast.  COMPARISON:  MRI head 11/06/2020.  CT head 11/06/2020 FINDINGS: Brain: Subtle hypodensity in the left corona radiata compatible with acute infarct as noted on diffusion-weighted imaging . This has developed since the CT yesterday. Negative for acute hemorrhage or mass.  Ventricle size normal. Vascular: Normal arterial flow voids. Skull: Negative Sinuses/Orbits: Paranasal sinuses clear. Bilateral cataract extraction. Other: None IMPRESSION: Developing hypodensity left corona radiata compatible with acute infarct as noted on diffusion-weighted imaging. Negative for hemorrhage. Electronically Signed   By: Marlan Palau M.D.   On: 11/07/2020 12:06   MR ANGIO HEAD WO CONTRAST  Result Date: 11/06/2020 CLINICAL DATA:  79 year old female code stroke presentation this morning. Slurred speech and right facial droop. EXAM: MRA HEAD WITHOUT CONTRAST MRA NECK WITHOUT AND WITH CONTRAST TECHNIQUE: Angiographic images of the Circle of Willis were obtained using MRA technique without intravenous contrast. Angiographic images of the neck were obtained using MRA technique without and with intravenous contrast. Carotid stenosis measurements (when applicable) are obtained utilizing NASCET  criteria, using the distal internal carotid diameter as the denominator. CONTRAST:  8mL GADAVIST GADOBUTROL 1 MMOL/ML IV SOLN COMPARISON:  Brain MRI today reported separately. FINDINGS: MRA NECK FINDINGS Precontrast time-of-flight images demonstrate antegrade flow in the bilateral cervical carotid and vertebral arteries. Codominant vertebral arteries to the skull base. Carotid bifurcations are within normal limits. Post-contrast neck MRA images reveal a 3 vessel arch configuration. Tortuous proximal right CCA with mild irregularity, no significant stenosis. Patent right carotid bifurcation. Tortuous cervical right ICA with mild irregularity, no significant stenosis. Tortuous left CCA without stenosis. Patent left carotid bifurcation. Mildly  tortuous cervical left ICA with minimal irregularity and no stenosis. Both proximal subclavian arteries are patent with mild tortuosity, a kinked appearance at the thoracic inlet. Normal bilateral vertebral artery origins. Tortuous left V1 segment. Codominant vertebral arteries are patent and normal to the vertebrobasilar junction. Patent PICA origins incidentally noted. Visible ICA siphons, carotid termini, and basilar appear grossly normal. See below. MRA HEAD FINDINGS Antegrade flow in the posterior circulation with codominant distal vertebral arteries. Normal PICA origins. Patent vertebrobasilar junction with no distal vertebral stenosis. Patent basilar artery without stenosis. Patent SCA and PCA origins. Posterior communicating arteries are diminutive or absent. Minimal to mild bilateral PCA branch irregularity without significant stenosis. Antegrade flow in both ICA siphons. Mild bilateral siphon irregularity in keeping with atherosclerosis. No siphon stenosis is identified although on the left there is a small laterally directed 2 mm saccular lesion of the cavernous segment which could be a tiny cavernous aneurysm or atherosclerotic pseudo lesion (series 5, image 104). Patent ophthalmic artery origins. Patent carotid termini. Normal MCA and ACA origins. Diminutive anterior communicating artery. Visible ACA branches are within normal limits. Right MCA M1 segment, right MCA bifurcation, and visible right MCA branches are within normal limits. Left MCA M1 segment and left MCA bifurcation are patent without stenosis. Visible left MCA branches are within normal limits. IMPRESSION: 1. Negative for large vessel occlusion. 2. Mild atherosclerosis in the neck without stenosis. Tortuous cervical carotid arteries. 3. Evidence of intracranial atherosclerosis but no significant intracranial stenosis. 4. Positive for a small 2 mm saccular lesion arising laterally from the cavernous Left ICA. Differential is tiny cavernous  aneurysm versus atherosclerotic pseudo-lesion. Electronically Signed   By: Odessa Fleming M.D.   On: 11/06/2020 12:05   MR ANGIO NECK W WO CONTRAST  Result Date: 11/06/2020 CLINICAL DATA:  79 year old female code stroke presentation this morning. Slurred speech and right facial droop. EXAM: MRA HEAD WITHOUT CONTRAST MRA NECK WITHOUT AND WITH CONTRAST TECHNIQUE: Angiographic images of the Circle of Willis were obtained using MRA technique without intravenous contrast. Angiographic images of the neck were obtained using MRA technique without and with intravenous contrast. Carotid stenosis measurements (when applicable) are obtained utilizing NASCET criteria, using the distal internal carotid diameter as the denominator. CONTRAST:  8mL GADAVIST GADOBUTROL 1 MMOL/ML IV SOLN COMPARISON:  Brain MRI today reported separately. FINDINGS: MRA NECK FINDINGS Precontrast time-of-flight images demonstrate antegrade flow in the bilateral cervical carotid and vertebral arteries. Codominant vertebral arteries to the skull base. Carotid bifurcations are within normal limits. Post-contrast neck MRA images reveal a 3 vessel arch configuration. Tortuous proximal right CCA with mild irregularity, no significant stenosis. Patent right carotid bifurcation. Tortuous cervical right ICA with mild irregularity, no significant stenosis. Tortuous left CCA without stenosis. Patent left carotid bifurcation. Mildly tortuous cervical left ICA with minimal irregularity and no stenosis. Both proximal subclavian arteries are patent with mild tortuosity, a kinked appearance at the  thoracic inlet. Normal bilateral vertebral artery origins. Tortuous left V1 segment. Codominant vertebral arteries are patent and normal to the vertebrobasilar junction. Patent PICA origins incidentally noted. Visible ICA siphons, carotid termini, and basilar appear grossly normal. See below. MRA HEAD FINDINGS Antegrade flow in the posterior circulation with codominant distal  vertebral arteries. Normal PICA origins. Patent vertebrobasilar junction with no distal vertebral stenosis. Patent basilar artery without stenosis. Patent SCA and PCA origins. Posterior communicating arteries are diminutive or absent. Minimal to mild bilateral PCA branch irregularity without significant stenosis. Antegrade flow in both ICA siphons. Mild bilateral siphon irregularity in keeping with atherosclerosis. No siphon stenosis is identified although on the left there is a small laterally directed 2 mm saccular lesion of the cavernous segment which could be a tiny cavernous aneurysm or atherosclerotic pseudo lesion (series 5, image 104). Patent ophthalmic artery origins. Patent carotid termini. Normal MCA and ACA origins. Diminutive anterior communicating artery. Visible ACA branches are within normal limits. Right MCA M1 segment, right MCA bifurcation, and visible right MCA branches are within normal limits. Left MCA M1 segment and left MCA bifurcation are patent without stenosis. Visible left MCA branches are within normal limits. IMPRESSION: 1. Negative for large vessel occlusion. 2. Mild atherosclerosis in the neck without stenosis. Tortuous cervical carotid arteries. 3. Evidence of intracranial atherosclerosis but no significant intracranial stenosis. 4. Positive for a small 2 mm saccular lesion arising laterally from the cavernous Left ICA. Differential is tiny cavernous aneurysm versus atherosclerotic pseudo-lesion. Electronically Signed   By: Odessa Fleming M.D.   On: 11/06/2020 12:05   MR BRAIN WO CONTRAST  Result Date: 11/06/2020 CLINICAL DATA:  79 year old female code stroke presentation this morning. Slurred speech and right facial droop. EXAM: MRI HEAD WITHOUT CONTRAST TECHNIQUE: Multiplanar, multiecho pulse sequences of the brain and surrounding structures were obtained without intravenous contrast. COMPARISON:  Plain head CT 0945 hours today. FINDINGS: Brain: Patchy restricted diffusion in a 10-12  mm area of the posterior left corona radiata (series 5, image 30, series 8, image 18). No other abnormal diffusion. Faint T2 and FLAIR hyperintensity associated at that area. No hemorrhage or mass effect. Scattered bilateral T2 and FLAIR hyperintensity elsewhere, including lesions near the left frontal horn and at the anterior right corona radiata most resembling chronic white matter lacunar infarcts (series 15, image 32). No cortical encephalomalacia or definite chronic cerebral blood products. Mild for age T2 heterogeneity in the bilateral deep gray nuclei and the pons. No midline shift, mass effect, evidence of mass lesion, ventriculomegaly, extra-axial collection or acute intracranial hemorrhage. Cervicomedullary junction and pituitary are within normal limits. Vascular: Major intracranial vascular flow voids are preserved. Skull and upper cervical spine: C3-C4 chronic disc and endplate degeneration. No definite upper cervical spinal stenosis marrow signal. Normal background bone Sinuses/Orbits: Postoperative changes to both globes, otherwise negative. Other: Mastoids are clear. Visible internal auditory structures appear normal. Visible scalp and face appear negative. IMPRESSION: 1. Positive for patchy 12 mm acute white matter infarct in the Left corona radiata. No associated hemorrhage or mass effect. These results were communicated to Dr. Iver Nestle at 1153 hours on 11/06/2020 by text page via the [facility] messaging system. 2. Head and Neck MRA reported separately. 3. Underlying mild to moderate for age signal changes compatible with chronic small vessel disease, with mild involvement of the deep gray nuclei and pons. Electronically Signed   By: Odessa Fleming M.D.   On: 11/06/2020 11:55   ECHOCARDIOGRAM COMPLETE  Result Date: 11/07/2020  ECHOCARDIOGRAM REPORT   Patient Name:   Lisa [name] Podolak Date of Exam: 11/07/2020 Medical Rec #:  [id number]        Height:       62.0 in Accession #:    [id number]       Weight:        179.0 lb Date of Birth:  1941-01-18        BSA:          1.824 m Patient Age:    79 years         BP:           123/49 mmHg Patient Gender: F                HR:           58 bpm. Exam Location:  [facility] Procedure: 2D Echo, Cardiac Doppler, Color Doppler and Intracardiac            Opacification Agent Indications:     Stroke 434.91  History:         Patient has no prior history of Echocardiogram examinations.                  Risk Factors:Hypertension and Dyslipidemia.  Sonographer:     [name] RDCS (AE) Referring Phys:  [id number] Luc[employer] Shutters Diagnosing Phys: Debbe Odea MD  Sonographer Comments: Suboptimal apical window and suboptimal parasternal window. IMPRESSIONS  1. Left ventricular ejection fraction, by estimation, is 55 to 60%. The left ventricle has normal function. The left ventricle has no regional wall motion abnormalities. Left ventricular diastolic parameters are consistent with Grade I diastolic dysfunction (impaired relaxation).  2. Right ventricular systolic function is normal. The right ventricular size is normal.  3. The mitral valve is normal in structure. No evidence of mitral valve regurgitation. No evidence of mitral stenosis.  4. The aortic valve is tricuspid. Aortic valve regurgitation is not visualized. No aortic stenosis is present. FINDINGS  Left Ventricle: Left ventricular ejection fraction, by estimation, is 55 to 60%. The left ventricle has normal function. The left ventricle has no regional wall motion abnormalities. Definity contrast agent was given IV to delineate the left ventricular  endocardial borders. The left ventricular internal cavity size was normal in size. There is no left ventricular hypertrophy. Left ventricular diastolic parameters are consistent with Grade I diastolic dysfunction (impaired relaxation). Right Ventricle: The right ventricular size is normal. No increase in right ventricular wall thickness. Right ventricular systolic function is normal. Left  Atrium: Left atrial size was normal in size. Right Atrium: Right atrial size was normal in size. Pericardium: There is no evidence of pericardial effusion. Mitral Valve: The mitral valve is normal in structure. No evidence of mitral valve regurgitation. No evidence of mitral valve stenosis. Tricuspid Valve: The tricuspid valve is normal in structure. Tricuspid valve regurgitation is not demonstrated. No evidence of tricuspid stenosis. Aortic Valve: The aortic valve is tricuspid. Aortic valve regurgitation is not visualized. No aortic stenosis is present. Aortic valve mean gradient measures 8.0 mmHg. Aortic valve peak gradient measures 12.8 mmHg. Aortic valve area, by VTI measures 1.69  cm. Pulmonic Valve: The pulmonic valve was not well visualized. Pulmonic valve regurgitation is not visualized. No evidence of pulmonic stenosis. Aorta: The aortic root is normal in size and structure. Venous: The inferior vena cava was not well visualized. IAS/Shunts: No atrial level shunt detected by color flow Doppler.  LEFT VENTRICLE PLAX 2D LVIDd:  5.18 cm  Diastology LVIDs:         3.67 cm  LV e' medial:    6.31 cm/s LV PW:         1.03 cm  LV E/e' medial:  14.6 LV IVS:        0.73 cm  LV e' lateral:   6.09 cm/s LVOT diam:     2.00 cm  LV E/e' lateral: 15.1 LV SV:         74 LV SV Index:   41 LVOT Area:     3.14 cm  RIGHT VENTRICLE RV Basal diam:  3.23 cm RV S prime:     15.90 cm/s TAPSE (M-mode): 3.7 cm LEFT ATRIUM             Index       RIGHT ATRIUM           Index LA diam:        3.30 cm 1.81 cm/m  RA Area:     15.50 cm LA Vol (A2C):   52.5 ml 28.79 ml/m RA Volume:   43.40 ml  23.80 ml/m LA Vol (A4C):   42.5 ml 23.30 ml/m LA Biplane Vol: 46.9 ml 25.72 ml/m  AORTIC VALVE                    PULMONIC VALVE AV Area (Vmax):    1.57 cm     PV Vmax:        1.00 m/s AV Area (Vmean):   1.42 cm     PV Peak grad:   4.0 mmHg AV Area (VTI):     1.69 cm     RVOT Peak grad: 7 mmHg AV Vmax:           179.00 cm/s AV Vmean:           133.000 cm/s AV VTI:            0.438 m AV Peak Grad:      12.8 mmHg AV Mean Grad:      8.0 mmHg LVOT Vmax:         89.40 cm/s LVOT Vmean:        60.000 cm/s LVOT VTI:          0.236 m LVOT/AV VTI ratio: 0.54  AORTA Ao Root diam: 2.80 cm MITRAL VALVE                TRICUSPID VALVE MV Area (PHT): 3.24 cm     TR Peak grad:   18.8 mmHg MV Decel Time: 234 msec     TR Vmax:        217.00 cm/s MV E velocity: 92.10 cm/s MV A velocity: 123.00 cm/s  SHUNTS MV E/A ratio:  0.75         Systemic VTI:  0.24 m                             Systemic Diam: 2.00 cm Debbe Odea MD Electronically signed by Debbe Odea MD Signature Date/Time: 11/07/2020/1:19:09 PM    Final    CT HEAD CODE STROKE WO CONTRAST  Result Date: 11/06/2020 CLINICAL DATA:  Code stroke. New onset of slurred speech and right-sided facial droop beginning this morning. Unknown last seen well. EXAM: CT HEAD WITHOUT CONTRAST TECHNIQUE: Contiguous axial images were obtained from the base of the skull through the vertex without intravenous contrast. COMPARISON:  None. FINDINGS: Brain:  Scattered subcortical white matter hypoattenuation is most evident adjacent to the basal ganglia and in the operculum bilaterally. No acute cortical abnormality is present. Insular ribbon is normal bilaterally. Basal ganglia are normal. No acute hemorrhage or mass lesion is present. The ventricles are of normal size. No significant extraaxial fluid collection is present. The brainstem and cerebellum are within normal limits. Vascular: Atherosclerotic calcifications are present within the cavernous internal carotid arteries bilaterally and at the V3 segments of the vertebral arteries. No hyperdense vessel is present. Skull: Calvarium is intact. No focal lytic or blastic lesions are present. No significant extracranial soft tissue lesion is present. Sinuses/Orbits: The paranasal sinuses and mastoid air cells are clear. Bilateral lens replacements are noted. Globes  and orbits are otherwise unremarkable. ASPECTS ([place] Stroke Program Early CT Score) - Ganglionic level infarction (caudate, lentiform nuclei, internal capsule, insula, M1-M3 cortex): 7/7 - Supraganglionic infarction (M4-M6 cortex): 3/3 Total score (0-10 with 10 being normal): 10/10 IMPRESSION: 1. Scattered subcortical white matter hypoattenuation is most evident adjacent to the basal ganglia and operculum bilaterally. 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 2. No acute cortical abnormality. 3. ASPECTS is 10/10. The above was relayed via text pager to Dr. Iver Nestle on 11/06/2020 at 09:54 . Electronically Signed   By: Marin Roberts M.D.   On: 11/06/2020 09:54     Subjective: Patient was seen and examined before discharge.  No new complaint. 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  She does not smoke or drink.  Discharge Exam: Vitals:   11/07/20 0700 11/07/20 1146  BP: (!) 144/65 (!) 144/62  Pulse: 67 79  Resp: 17 18  Temp: 97.8 F (36.6 C) 98.7 F (37.1 C)  SpO2: 98% 96%   Vitals:   11/07/20 0000 11/07/20 0439 11/07/20 0700 11/07/20 1146  BP: 132/67 (!) 123/49 (!) 144/65 (!) 144/62  Pulse: 65 (!) 58 67 79  Resp: 18 18 17 18   Temp: (!) 97.5 F (36.4 C) 98.2 F (36.8 C) 97.8 F (36.6 C) 98.7 F (37.1 C)  TempSrc:  Axillary Oral   SpO2: 99% 98% 98% 96%  Weight:      Height:        General: Pt is alert, awake, not in acute distress Cardiovascular: RRR, S1/S2 +, no rubs, no gallops Respiratory: CTA bilaterally, no wheezing, no rhonchi Abdominal: Soft, NT, ND, bowel sounds + Extremities: no edema, no cyanosis   The results of significant diagnostics from this hospitalization (including imaging, microbiology, ancillary and laboratory) are listed below for reference.    Microbiology: Recent Results (from the past 240 hour(s))  Respiratory Panel by RT PCR (Flu A&B,  Covid) - Nasopharyngeal Swab     Status: None   Collection Time: 11/06/20 11:37 AM   Specimen: Nasopharyngeal Swab  Result Value Ref Range Status   SARS Coronavirus 2 by RT PCR NEGATIVE NEGATIVE Final    Comment: (NOTE) SARS-CoV-2 target nucleic acids are NOT DETECTED.  The SARS-CoV-2 RNA is generally detectable in upper respiratoy specimens during the acute phase of infection. The lowest concentration of SARS-CoV-2 viral copies this assay can detect is 131 copies/mL. A negative result does not preclude SARS-Cov-2 infection and should not be used as the sole basis for treatment or other patient management decisions. A negative result may occur with  improper specimen collection/handling, submission of specimen other than nasopharyngeal swab, presence of viral mutation(s) within the areas targeted by this  assay, and inadequate number of viral copies (<131 copies/mL). A negative result must be combined with clinical observations, patient history, and epidemiological information. The expected result is Negative.  Fact Sheet for Patients:  https://www.moore.com/https://www.fda.gov/media/142436/download  Fact Sheet for Healthcare Providers:  https://www.young.biz/https://www.fda.gov/media/142435/download  This test is no t yet approved or cleared by the [place] FDA and  has been authorized for detection and/or diagnosis of SARS-CoV-2 by FDA under an Emergency Use Authorization (EUA). This EUA will remain  in effect (meaning this test can be used) for the duration of the COVID-19 declaration under Section 564(b)(1) of the Act, 21 U.S.C. section 360bbb-3(b)(1), unless the authorization is terminated or revoked sooner.     Influenza A by PCR NEGATIVE NEGATIVE Final   Influenza B by PCR NEGATIVE NEGATIVE Final    Comment: (NOTE) The Xpert Xpress SARS-CoV-2/FLU/RSV assay is intended as an aid in  the diagnosis of influenza from Nasopharyngeal swab specimens and  should not be used as a sole basis for treatment. Nasal  washings and  aspirates are unacceptable for Xpert Xpress SARS-CoV-2/FLU/RSV  testing.  Fact Sheet for Patients: https://www.moore.com/https://www.fda.gov/media/142436/download  Fact Sheet for Healthcare Providers: https://www.young.biz/https://www.fda.gov/media/142435/download  This test is not yet approved or cleared by the [place] FDA and  has been authorized for detection and/or diagnosis of SARS-CoV-2 by  FDA under an Emergency Use Authorization (EUA). This EUA will remain  in effect (meaning this test can be used) for the duration of the  Covid-19 declaration under Section 564(b)(1) of the Act, 21  U.S.C. section 360bbb-3(b)(1), unless the authorization is  terminated or revoked. Performed at [facility], [address] [place], [place] [id number]      Labs: BNP (last 3 results) No results for input(s): BNP in the last 8760 hours. Basic Metabolic Panel: Recent Labs  Lab 11/06/20 0938  NA 139  K 4.1  CL 104  CO2 24  GLUCOSE 137*  BUN 25*  CREATININE 1.25*  CALCIUM 9.4   Liver Function Tests: Recent Labs  Lab 11/06/20 0938  AST 29  ALT 22  ALKPHOS 24*  BILITOT 0.6  PROT 8.0  ALBUMIN 4.2   No results for input(s): LIPASE, AMYLASE in the last 168 hours. No results for input(s): AMMONIA in the last 168 hours. CBC: Recent Labs  Lab 11/06/20 0938  WBC 5.3  NEUTROABS 3.1  HGB 12.4  HCT 37.2  MCV 88.6  PLT 255   Cardiac Enzymes: No results for input(s): CKTOTAL, CKMB, CKMBINDEX, TROPONINI in the last 168 hours. BNP: Invalid input(s): POCBNP CBG: No results for input(s): GLUCAP in the last 168 hours. D-Dimer No results for input(s): DDIMER in the last 72 hours. Hgb A1c Recent Labs    11/07/20 0415  HGBA1C 6.1*   Lipid Profile Recent Labs    11/07/20 0415  CHOL 177  HDL 47  LDLCALC 87  TRIG 215*  CHOLHDL 3.8   Thyroid function studies No results for input(s): TSH, T4TOTAL, T3FREE, THYROIDAB in the last 72 hours.  Invalid input(s): FREET3 Anemia work up No  results for input(s): VITAMINB12, FOLATE, FERRITIN, TIBC, IRON, RETICCTPCT in the last 72 hours. Urinalysis    Component Value Date/Time   COLORURINE YELLOW (A) 11/06/2020 1032   APPEARANCEUR CLEAR (A) 11/06/2020 1032   LABSPEC 1.015 11/06/2020 1032   PHURINE 6.0 11/06/2020 1032   GLUCOSEU NEGATIVE 11/06/2020 1032   HGBUR NEGATIVE 11/06/2020 1032   BILIRUBINUR NEGATIVE 11/06/2020 1032   KETONESUR NEGATIVE 11/06/2020 1032   PROTEINUR NEGATIVE 11/06/2020 1032  UROBILINOGEN 0.2 11/05/2012 2114   NITRITE NEGATIVE 11/06/2020 1032   LEUKOCYTESUR NEGATIVE 11/06/2020 1032   Sepsis Labs Invalid input(s): PROCALCITONIN,  WBC,  LACTICIDVEN Microbiology Recent Results (from the past 240 hour(s))  Respiratory Panel by RT PCR (Flu A&B, Covid) - Nasopharyngeal Swab     Status: None   Collection Time: 11/06/20 11:37 AM   Specimen: Nasopharyngeal Swab  Result Value Ref Range Status   SARS Coronavirus 2 by RT PCR NEGATIVE NEGATIVE Final    Comment: (NOTE) SARS-CoV-2 target nucleic acids are NOT DETECTED.  The SARS-CoV-2 RNA is generally detectable in upper respiratoy specimens during the acute phase of infection. The lowest concentration of SARS-CoV-2 viral copies this assay can detect is 131 copies/mL. A negative result does not preclude SARS-Cov-2 infection and should not be used as the sole basis for treatment or other patient management decisions. A negative result may occur with  improper specimen collection/handling, submission of specimen other than nasopharyngeal swab, presence of viral mutation(s) within the areas targeted by this assay, and inadequate number of viral copies (<131 copies/mL). A negative result must be combined with clinical observations, patient history, and epidemiological information. The expected result is Negative.  Fact Sheet for Patients:  https://www.moore.com/  Fact Sheet for Healthcare Providers:   https://www.young.biz/  This test is no t yet approved or cleared by the [place] FDA and  has been authorized for detection and/or diagnosis of SARS-CoV-2 by FDA under an Emergency Use Authorization (EUA). This EUA will remain  in effect (meaning this test can be used) for the duration of the COVID-19 declaration under Section 564(b)(1) of the Act, 21 U.S.C. section 360bbb-3(b)(1), unless the authorization is terminated or revoked sooner.     Influenza A by PCR NEGATIVE NEGATIVE Final   Influenza B by PCR NEGATIVE NEGATIVE Final    Comment: (NOTE) The Xpert Xpress SARS-CoV-2/FLU/RSV assay is intended as an aid in  the diagnosis of influenza from Nasopharyngeal swab specimens and  should not be used as a sole basis for treatment. Nasal washings and  aspirates are unacceptable for Xpert Xpress SARS-CoV-2/FLU/RSV  testing.  Fact Sheet for Patients: https://www.moore.com/  Fact Sheet for Healthcare Providers: https://www.young.biz/  This test is not yet approved or cleared by the [place] FDA and  has been authorized for detection and/or diagnosis of SARS-CoV-2 by  FDA under an Emergency Use Authorization (EUA). This EUA will remain  in effect (meaning this test can be used) for the duration of the  Covid-19 declaration under Section 564(b)(1) of the Act, 21  U.S.C. section 360bbb-3(b)(1), unless the authorization is  terminated or revoked. Performed at [facility], [address] [place], [place] [address]     Time coordinating discharge: Over 30 minutes  SIGNED:  Arnetha Courser, MD  [facility] 11/07/2020, 2:17 PM  If 7PM-7AM, please contact night-coverage [email or web address]  This record has been created using Conservation officer, historic buildings. Errors have been sought and corrected,but may not always be located. Such creation errors do not reflect on the standard of care.

## 2020-11-07 NOTE — Care Management CC44 (Signed)
Condition Code 44 Documentation Completed  Patient Details  Name: Lisa [name], Dr. MRN: [id number] Date of Birth: 18-May-1941   Condition Code 44 given:  Yes Patient signature on Condition Code 44 notice:  Yes Documentation of 2 MD's agreement:  Yes Code 44 added to claim:  Yes    Kianna Billet E Lian Pounds, LCSW 11/07/2020, 10:29 AM

## 2020-11-07 NOTE — Telephone Encounter (Signed)
Placed ZIO XT  on patient in hospital prior to discharge today as requested by Lisa Ducking, NP. Patient was ordered to wear for 4 weeks. She will wear the monitor placed today for 2 weeks and a second monitor has been registered to be mailed to patient so she can place it after she removes the first.

## 2020-11-07 NOTE — Progress Notes (Signed)
*  PRELIMINARY RESULTS* Echocardiogram 2D Echocardiogram has been performed.  [name] 11/07/2020, 8:35 AM

## 2020-11-07 NOTE — Telephone Encounter (Signed)
-----   Message from Creig Hines, NP sent at 11/07/2020  2:24 PM EST ----- Hey team,  This lady is going home from [facility] after a stroke.  We've never seen her before, but we did place a Zio today.  Would you pls arrange for a new patient f/u in about 4-6 wks so that we can account for this monitor and she doesn't fall out of the system?  Thanks,  Thayer Ohm

## 2020-11-07 NOTE — Evaluation (Addendum)
Speech Language Pathology Evaluation Patient Details Name: Lisa [name], Dr. MRN: [id number] DOB: Mar 05, 1941 Today's Date: 11/07/2020 Time: 1140-1230 SLP Time Calculation (min) (ACUTE ONLY): 50 min  Problem List:  Patient Active Problem List   Diagnosis Date Noted  . Acute CVA (cerebrovascular accident) (HCC) 11/06/2020  . Chronic kidney disease   . Hypertension   . Hyperlipemia   . Ingrown toenail 09/26/2018  . Acute non-recurrent maxillary sinusitis 01/05/2018  . Spinal stenosis of lumbar region 09/20/2013  . Pure hypercholesterolemia 09/20/2013  . Allergic rhinitis, cause unspecified 09/20/2013  . Encounter for long-term (current) use of other medications 09/20/2013   Past Medical History:  Past Medical History:  Diagnosis Date  . Acne rosacea   . Allergic rhinitis   . Chronic kidney disease    kidney stones, Nov. 2013- tx with Lithotripsy, still has some present   . Cough variant asthma   . Degenerative disc disease    knees, ankles, back- OA  . GERD (gastroesophageal reflux disease)   . Headache(784.0)    since stopping aleve- 07/30/2013  . Hyperlipemia    dyslipidemia  . Hypertension    [facility] cardiac pharmacist follows pt. Lisa [name] ,has never taken anti-hypertensive  . Osteoarthritis   . Plantar fasciitis    (using orthotics-they are wearing out)- Dr Luther Bradley  . Pneumonia    double pneumonia - relative to sinus infection , had chemical cauterization   . PONV (postoperative nausea and vomiting)    urgency, N&V for days, memory & processing  . Sleep apnea    Bringing cpap mask and tubing, study last done- 2006   Past Surgical History:  Past Surgical History:  Procedure Laterality Date  . ABDOMINAL HYSTERECTOMY  1990's  . BLADDER REPAIR    . BLADDER SUSPENSION  90's  . MAXIMUM ACCESS (MAS)POSTERIOR LUMBAR INTERBODY FUSION (PLIF) 2 LEVEL N/A 08/12/2013   Procedure: FOR MAXIMUM ACCESS (MAS) POSTERIOR LUMBAR INTERBODY FUSION (PLIF) 2 LEVEL;  Surgeon:  Tia Alert, MD;  Location: [facility];  Service: Neurosurgery;  Laterality: N/A;  FOR MAXIMUM ACCESS (MAS) POSTERIOR LUMBAR INTERBODY FUSION (PLIF) 2 LEVEL  . MULTIPLE TOOTH EXTRACTIONS     wisdom teeth extractions   . POSTERIOR FUSION LUMBAR SPINE  08/12/2013   Dr Yetta Barre  . TONSILLECTOMY     HPI:  Pt is a 79 y.o. female with past medical history significant for hypertension, hyperlipidemia, sleep apnea on CPAP, chronic kidney disease, cough variant asthma, allergic rhinitis presenting with acute onset right facial droop and slurred speech which was rapidly improving.  She reports that she had her booster shot of Moderna on Friday.  Yesterday was a normal day other than she ate out twice which is slightly unusual.  This morning she was feeling a bit nauseated and had some soft stool but no diarrhea or vomiting, which she attributed to her diet yesterday.  When she initially woke up at 6:30 AM she did not immediately notice anything wrong however when she was talking to her dog at 7:30 AM (the first time she was speaking out loud in the day) she felt like she had some slurred speech.  Upon looking in the mirror she felt like she had a right facial droop.  She therefore activated EMS.  She denies any other signs or symptoms of infection.  She denies any vision changes, hearing loss, swallowing difficulties or focal numbness or weakness other than the facial droop.    Assessment / Plan / Recommendation Clinical  Impression   Pt seen for assessment today at bedside. Pt dressed for d/c, sitting in chair in room. Daughter arrived toward end of session. Pt appears to present w/ mild Dysarthria impacting her verbal communication at the word-conversation levels. She is 100% intelligible at baseline, however, pt notes a decline in her quality of verbal communication - "a thickness when I talk". When pt utilized strategies of slowing rate of speech, emphasizing speech sounds, and supporting words w/ full breath  support (getting Louder), pt's Dysarthria improved significantly and articulation of speech improved. Education and practice of articulation strategies was completed w/ both pt (Dtr who was present toward end of session -- w/ focus on over-articulation w/ Big, Loud speech; identified need to replenish breath support to complete the sentence/task w/ good articulation and clarity. Pt noticed when she emphasized words w/ increased volume and effort, intelligibility greatly improved. Pt presented w/ slight-min Right labial oral motor weakness at rest and during oral motor tasks; min ecreased Right labial tone and ROM noted often during retraction. Noted improved oral motor strength/ROM w/ increased effort. No apparent expressive or receptive aphasia noted, no cognitive deficits noted.  Discussed use of over-articulation strategies w/ pt/Dtr; practice together and gave Handouts. Recommend pt f/u w/ Outpatient/Home Health skilled ST servcies to address Dysarthria through exercises and strategies to improve verbal communication in ADLs. CM/NSG/MD updated. Pt and Daughter agreed.      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)    Follow Up Recommendations  Home health SLP (at [facility])    Frequency and Duration  (n/a)   (n/a)      SLP Evaluation Cognition  Overall Cognitive Status: Within Functional Limits for tasks assessed Arousal/Alertness: Awake/alert Orientation Level: Oriented X4 Attention: Focused;Sustained Focused Attention: Appears intact Sustained Attention: Appears intact Awareness: Appears intact Problem Solving: Appears intact Behaviors:  (n/a) Safety/Judgment: Appears intact       Comprehension  Auditory Comprehension Overall Auditory Comprehension: Appears within functional limits for tasks assessed Reading  Comprehension Reading Status: Not tested    Expression Verbal Expression Overall Verbal Expression: Appears within functional limits for tasks assessed Non-Verbal Means of Communication: Not applicable Written Expression Dominant Hand: Left Written Expression: Not tested   Oral / Motor  Oral Motor/Sensory Function Overall Oral Motor/Sensory Function: Mild impairment (min) Facial ROM: Reduced right;Suspected CN VII (facial) dysfunction (slight) Facial Symmetry: Abnormal symmetry right;Suspected CN VII (facial) dysfunction (slight) Facial Strength: Reduced right;Suspected CN VII (facial) dysfunction (slight) Facial Sensation:  (slight) Lingual ROM: Within Functional Limits Lingual Symmetry: Within Functional Limits Lingual Strength: Within Functional Limits Lingual Sensation: Within Functional Limits Velum: Within Functional Limits Mandible: Within Functional Limits Motor Speech Overall Motor Speech: Impaired (slight) Respiration: Within functional limits Phonation: Normal Resonance: Within functional limits Articulation: Impaired Level of Impairment: Conversation Intelligibility: Intelligible (grossly - min "thickness"; min slower) Effective Techniques: Slow rate;Increased vocal intensity;Over-articulate   GO                       Jerilynn Som, MS, CCC-SLP Speech Language Pathologist [facility] [phone] [facility] 11/07/2020, 12:39 PM

## 2020-11-07 NOTE — Telephone Encounter (Signed)
Patient in transit and would like a call back later or tomorrow.

## 2020-11-07 NOTE — Progress Notes (Signed)
Met with patient at bedside. Patient is a resident at [facility]. She reported her daughter will provide transportation home at discharge. Patient denied any needs prior to discharge.  Oleh Genin, [place]

## 2020-11-07 NOTE — Plan of Care (Signed)
  Problem: Education: Goal: Knowledge of disease or condition will improve 11/07/2020 0017 by Narda Amber, RN Outcome: Progressing 11/07/2020 0016 by Narda Amber, RN Outcome: Progressing Goal: Knowledge of patient specific risk factors addressed and post discharge goals established will improve 11/07/2020 0017 by Narda Amber, RN Outcome: Progressing 11/07/2020 0016 by Narda Amber, RN Outcome: Progressing   Problem: Education: Goal: Knowledge of General Education information will improve Description: Including pain rating scale, medication(s)/side effects and non-pharmacologic comfort measures Outcome: Progressing   Problem: Clinical Measurements: Goal: Will remain free from infection Outcome: Progressing   Problem: Nutrition: Goal: Adequate nutrition will be maintained Outcome: Progressing   Problem: Coping: Goal: Level of anxiety will decrease Outcome: Progressing   Problem: Elimination: Goal: Will not experience complications related to bowel motility Outcome: Progressing   Problem: Safety: Goal: Ability to remain free from injury will improve Outcome: Progressing

## 2020-11-07 NOTE — Plan of Care (Signed)
  Problem: Education: Goal: Knowledge of disease or condition will improve Outcome: Adequate for Discharge Goal: Knowledge of patient specific risk factors addressed and post discharge goals established will improve Outcome: Adequate for Discharge   Problem: Education: Goal: Knowledge of General Education information will improve Description: Including pain rating scale, medication(s)/side effects and non-pharmacologic comfort measures Outcome: Adequate for Discharge   Problem: Health Behavior/Discharge Planning: Goal: Ability to manage health-related needs will improve Outcome: Adequate for Discharge   Problem: Clinical Measurements: Goal: Ability to maintain clinical measurements within normal limits will improve Outcome: Adequate for Discharge Goal: Will remain free from infection Outcome: Adequate for Discharge Goal: Diagnostic test results will improve Outcome: Adequate for Discharge Goal: Respiratory complications will improve Outcome: Adequate for Discharge Goal: Cardiovascular complication will be avoided Outcome: Adequate for Discharge   Problem: Activity: Goal: Risk for activity intolerance will decrease Outcome: Adequate for Discharge   Problem: Nutrition: Goal: Adequate nutrition will be maintained Outcome: Adequate for Discharge   Problem: Coping: Goal: Level of anxiety will decrease Outcome: Adequate for Discharge   Problem: Elimination: Goal: Will not experience complications related to bowel motility Outcome: Adequate for Discharge Goal: Will not experience complications related to urinary retention Outcome: Adequate for Discharge   Problem: Pain Managment: Goal: General experience of comfort will improve Outcome: Adequate for Discharge   Problem: Safety: Goal: Ability to remain free from injury will improve Outcome: Adequate for Discharge   Problem: Skin Integrity: Goal: Risk for impaired skin integrity will decrease Outcome: Adequate for  Discharge

## 2020-11-07 NOTE — Progress Notes (Signed)
Neurology Progress Note  Patient ID: [name], Dr. is a 79 y.o. with PMHx of  has a past medical history of Acne rosacea, Allergic rhinitis, Chronic kidney disease, Cough variant asthma, Degenerative disc disease, GERD (gastroesophageal reflux disease), Headache(784.0), Hyperlipemia, Hypertension, Osteoarthritis, Plantar fasciitis, Pneumonia, PONV (postoperative nausea and vomiting), and Sleep apnea.   Initially consulted for: Right facial droop, found to have left lacunar infarct   Subjective: -Feels that her facial droop and speech are worse again today, and she is having some difficulty with biting the right side of her tongue  Exam: Vitals:   11/07/20 0439 11/07/20 0700  BP: (!) 123/49 (!) 144/65  Pulse: (!) 58 67  Resp: 18 17  Temp: 98.2 F (36.8 C) 97.8 F (36.6 C)  SpO2: 98% 98%   Gen: In bed, comfortable  Resp: non-labored breathing, no grossly audible wheezing Cardiac: Perfusing extremities well  Abd: soft, nt  Neuro: MS: Alert, awake, appropriately conversant without evidence of aphasia or neglect on casual evaluation CN: Right facial droop more prominent than previously, external ocular movements intact, tongue midline Motor: Remains 5 out of 5 throughout, but does have mild right hand pronation without drift Gait: Remains mildly ataxic with tandem gait, stable  Pertinent Labs: Lab Results  Component Value Date   CHOL 177 11/07/2020   HDL 47 11/07/2020   LDLCALC 87 11/07/2020   LDLDIRECT 105.0 05/07/2015   TRIG 215 (H) 11/07/2020   CHOLHDL 3.8 11/07/2020   Lab Results  Component Value Date   HGBA1C 6.1 (H) 11/07/2020   Utox MDMA positive   ECHO completed, grade 1 diastolic dysfunction  Head CT personally reviewed, evolving lacunar infarct without hemorrhagic conversion  Impression: Ms. Marton Redwood is a 79 year old woman presenting with a lacunar stroke likely in the setting of hypertension, hyperlipidemia and a new diagnosis of prediabetes.  Due to  worsening facial droop and new pronation without drift of the right upper extremity on evaluation this morning, I repeated head CT to confirm stability of her stroke.  Given stability of the imaging her fluctuating symptoms represent the natural stuttering nature of lacunar strokes which was discussed with the patient.  Recommendations: -30-day event monitor on discharge -Order placed for outpatient follow-up with [facility] -Patient counseled on MDMA avoidance, and she reports not understanding how her U tox ended up positive as she is not aware of any exposure to this substance   Brooke Dare MD-PhD [facility] [phone]

## 2020-11-08 ENCOUNTER — Telehealth: Payer: Self-pay

## 2020-11-08 NOTE — Telephone Encounter (Signed)
Received request for notes from zio to support order.  No notes from cvd.  Faxed dc summary from [facility].

## 2020-11-09 DIAGNOSIS — G4733 Obstructive sleep apnea (adult) (pediatric): Secondary | ICD-10-CM | POA: Diagnosis not present

## 2020-11-09 DIAGNOSIS — Z0000 Encounter for general adult medical examination without abnormal findings: Secondary | ICD-10-CM | POA: Diagnosis not present

## 2020-11-09 DIAGNOSIS — I129 Hypertensive chronic kidney disease with stage 1 through stage 4 chronic kidney disease, or unspecified chronic kidney disease: Secondary | ICD-10-CM | POA: Diagnosis not present

## 2020-11-09 DIAGNOSIS — Z1389 Encounter for screening for other disorder: Secondary | ICD-10-CM | POA: Diagnosis not present

## 2020-11-09 DIAGNOSIS — E785 Hyperlipidemia, unspecified: Secondary | ICD-10-CM | POA: Diagnosis not present

## 2020-11-12 NOTE — Telephone Encounter (Signed)
Scheduled

## 2020-11-21 ENCOUNTER — Encounter: Payer: Self-pay | Admitting: Neurology

## 2020-11-21 ENCOUNTER — Ambulatory Visit (INDEPENDENT_AMBULATORY_CARE_PROVIDER_SITE_OTHER): Payer: Medicare Other | Admitting: Neurology

## 2020-11-21 ENCOUNTER — Ambulatory Visit (INDEPENDENT_AMBULATORY_CARE_PROVIDER_SITE_OTHER): Payer: Medicare Other

## 2020-11-21 VITALS — BP 155/70 | HR 80 | Ht 62.0 in | Wt 181.0 lb

## 2020-11-21 DIAGNOSIS — R413 Other amnesia: Secondary | ICD-10-CM

## 2020-11-21 DIAGNOSIS — I6381 Other cerebral infarction due to occlusion or stenosis of small artery: Secondary | ICD-10-CM

## 2020-11-21 DIAGNOSIS — I639 Cerebral infarction, unspecified: Secondary | ICD-10-CM

## 2020-11-21 DIAGNOSIS — G3184 Mild cognitive impairment, so stated: Secondary | ICD-10-CM | POA: Diagnosis not present

## 2020-11-21 NOTE — Progress Notes (Signed)
[facility] [address] [place]. [place] [address] [phone]       OFFICE CONSULT NOTE  [name], Dr. Date of Birth:  1941-11-15 Medical Record Number:  [id number]   Referring MD: Shelby Dubin  Reason for Referral:  Stroke  HPI: [name] is a pleasant 79 year old Caucasian lady seen today for initial office consultation visit for stroke.  History is obtained from the patient, review of electronic medical records and I personally reviewed imaging films in PACS.  She has past medical history for hyperlipidemia, hypertension, obstructive sleep apnea, stage III chronic kidney disease who presented to [facility], ER on 11/06/2020 when she noticed sudden onset of right facial droop and slurred speech upon awakening from sleep.  She presented beyond time window for TPA.  Symptoms quickly resolved after arrival.  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  Urine drug screen was positive for ecstasy but patient declined use.  LDL cholesterol is 88 mg percent.  Hemoglobin A1c was 6.1.  2D echo showed ejection fraction of 55 to 60%.  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  MRA neck was unremarkable.  Patient was started on aspirin Plavix Lipitor.  She states she is tolerating them well with does have minor bruising.  Blood pressures well controlled in the 130s at home though it is elevated in the office today at 155/70.  She has had no recurrent stroke or TIA symptoms.  Her main complaint is her chronic back pain she has undergone spine surgery as well as multiple epidural injections her walking is difficult.  She has noticed some increased short-term memory difficulties and she often loses words and has word finding difficulties she had this even prior to the stroke but seems to be more pronounced.  She has no family Struve  Alzheimer's.  ROS:   14 system review of systems is positive for facial droop, slurred speech, back pain, gait difficulty and all other systems negative PMH:  Past Medical History:  Diagnosis Date  . Acne rosacea   . Allergic rhinitis   . Chronic kidney disease    kidney stones, Nov. 2013- tx with Lithotripsy, still has some present   . Cough variant asthma   . Degenerative disc disease    knees, ankles, back- OA  . GERD (gastroesophageal reflux disease)   . Headache(784.0)    since stopping aleve- 07/30/2013  . Hyperlipemia    dyslipidemia  . Hypertension    [facility] cardiac pharmacist follows pt. Riki Rusk Smart ,has never taken anti-hypertensive  . Osteoarthritis   . Plantar fasciitis    (using orthotics-they are wearing out)- Dr Luther Bradley  . Pneumonia    double pneumonia - relative to sinus infection , had chemical cauterization   . PONV (postoperative nausea and vomiting)    urgency, N&V for days, memory & processing  . Sleep apnea    Bringing cpap mask and tubing, study last done- 2006    Social History:  Social History   Socioeconomic History  . Marital status: Divorced    Spouse name: Not on file  . Number of children: Not on file  . Years of education: Not on file  . Highest education level: Not on file  Occupational History  . Occupation: retired  Tobacco Use  . Smoking status: Never Smoker  . Smokeless tobacco: Never Used  Substance and Sexual Activity  . Alcohol use: Yes    Comment: on occassion  . Drug use: No  . Sexual activity: Not on file  Other Topics Concern  . Not on file  Social History Narrative   Lives alone   Left Handed   Drinks 2-3 cups caffeine daily   Social Determinants of Health   Financial Resource Strain:   . Difficulty of Paying Living Expenses: Not on file  Food Insecurity:   . Worried About Programme researcher, broadcasting/film/video in the Last Year: Not on file  . Ran Out of Food in the Last Year: Not on file  Transportation Needs:   . Lack of  Transportation (Medical): Not on file  . Lack of Transportation (Non-Medical): Not on file  Physical Activity:   . Days of Exercise per Week: Not on file  . Minutes of Exercise per Session: Not on file  Stress:   . Feeling of Stress : Not on file  Social Connections:   . Frequency of Communication with Friends and Family: Not on file  . Frequency of Social Gatherings with Friends and Family: Not on file  . Attends Religious Services: Not on file  . Active Member of Clubs or Organizations: Not on file  . Attends Banker Meetings: Not on file  . Marital Status: Not on file  Intimate Partner Violence:   . Fear of Current or Ex-Partner: Not on file  . Emotionally Abused: Not on file  . Physically Abused: Not on file  . Sexually Abused: Not on file    Medications:   Current Outpatient Medications on File Prior to Visit  Medication Sig Dispense Refill  . acetaminophen (TYLENOL) 500 MG tablet Take 500 mg by mouth every 6 (six) hours as needed for pain.    Marland Kitchen alendronate (FOSAMAX) 70 MG tablet Take 70 mg by mouth once a week. Take with a full glass of water on an empty stomach.    Marland Kitchen amLODipine (NORVASC) 5 MG tablet Take 5 mg by mouth daily.    Marland Kitchen atorvastatin (LIPITOR) 40 MG tablet Take 1 tablet (40 mg total) by mouth at bedtime. 90 tablet 0  . clopidogrel (PLAVIX) 75 MG tablet Take 1 tablet (75 mg total) by mouth daily. 90 tablet 0  . Cobalamin Combinations (NEURIVA PLUS) CAPS Take by mouth.    . fenofibrate (TRICOR) 145 MG tablet Take 1 tablet by mouth daily, need MD visit for further refills 90 tablet 0  . fluticasone (FLONASE) 50 MCG/ACT nasal spray Place 2 sprays into the nose daily as needed. Allergic rhinitis.    Marland Kitchen mirabegron ER (MYRBETRIQ) 25 MG TB24 tablet Take 25 mg by mouth daily.    . Multiple Vitamin (MULTIVITAMIN WITH MINERALS) TABS Take 1 tablet by mouth daily.    Marland Kitchen omeprazole (PRILOSEC) 20 MG capsule Take 20 mg by mouth daily.    Marland Kitchen UNABLE TO FIND GOLI Applecider  Vinegar    . valsartan-hydrochlorothiazide (DIOVAN-HCT) 80-12.5 MG tablet TK 1 T PO QD  11  . aspirin EC 81 MG EC tablet Take 1 tablet (81 mg total) by mouth daily. Swallow whole. 30 tablet 0  . Cholecalciferol (VITAMIN D) 2000 UNITS tablet Take 2,000 Units by mouth daily.    . Glucosamine-Chondroitin (OSTEO BI-FLEX REGULAR STRENGTH PO) Take 1 tablet by mouth 2 (two) times daily.    . magnesium oxide (MAG-OX) 400 MG tablet Take 400 mg by mouth 2 (two) times daily.    . meloxicam (MOBIC)  15 MG tablet Take 15 mg by mouth daily.    Marland Kitchen tretinoin (RETIN-A) 0.05 % cream Apply 1 application topically 2 (two) times daily.    . vitamin B-12 (CYANOCOBALAMIN) 1000 MCG tablet Take 1,000 mcg by mouth daily.     No current facility-administered medications on file prior to visit.    Allergies:   Allergies  Allergen Reactions  . Other Other (See Comments)    Anesthesia causes nausea and vomiting, memory difficulty and problems processing.  . Oxycodone Nausea Only  . Sulfa Antibiotics Rash  . Sulfasalazine Rash    Physical Exam General: well developed, well nourished pleasant elderly Caucasian lady which is respiratory/T/T, seated, in no evident distress Head: head normocephalic and atraumatic.   Neck: supple with no carotid or supraclavicular bruits Cardiovascular: regular rate and rhythm, no murmurs Musculoskeletal: no deformity Skin:  no rash/petichiae Vascular:  Normal pulses all extremities  Neurologic Exam Mental Status: Awake and fully alert. Oriented to place and time. Recent and remote memory intact. Attention span, concentration and fund of knowledge appropriate. Mood and affect appropriate.  Diminished recall 2/3.  Able to name 15 animals which can walk on 4 legs.  Able to copy intersecting pentagons well.  Clock drawing 4/4. Cranial Nerves: Fundoscopic exam reveals sharp disc margins. Pupils equal, briskly reactive to light. Extraocular movements full without nystagmus. Visual fields  full to confrontation. Hearing intact. Facial sensation intact. Face, tongue, palate moves normally and symmetrically.  Motor: Normal bulk and tone. Normal strength in all tested extremity muscles. Sensory.: intact to touch , pinprick , position and vibratory sensation.  Coordination: Rapid alternating movements normal in all extremities. Finger-to-nose and heel-to-shin performed accurately bilaterally. Gait and Station: Arises from chair without difficulty. Stance is normal. Gait demonstrates normal stride length and balance . Able to heel, toe and tandem walk without difficulty.  Reflexes: 1+ and symmetric. Toes downgoing.   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  She has mild cognitive impairment     PLAN: I had a long d/w patient about her recent lacunar stroke,memory loss, risk for recurrent stroke/TIAs, personally independently reviewed imaging studies and stroke evaluation results and answered questions.Continue aspirin 81 mg daily and Plavix 75 mg daily for one more week and then stop Plavix and stay on aspirin alone for secondary stroke prevention and maintain strict control of hypertension with blood pressure goal below 130/90, diabetes with hemoglobin A1c goal below 6.5% and lipids with LDL cholesterol goal below 70 mg/dL. I also advised the patient to eat a healthy diet with plenty of whole grains, cereals, fruits and vegetables, exercise regularly and maintain ideal body weight .check memory panel labs and EEG.  We discussed memory compensation strategies and advised patient to increase participation in regular cognitively challenging activities like solving crossword puzzles, playing bridge and sodoku.  Greater than 50% time during this 45-minute consultation visit was spent on counseling and coordination of care about her lacunar stroke  and mild cognitive impairment and answering questions.  Followup in the future with my nurse practitioner Shanda Bumps in 3 months or call earlier if necessary. Delia Heady, MD  [facility] [address] [place], [place] [address]  Phone [phone] Fax [phone] Note: This document was prepared with digital dictation and possible smart phrase technology. Any transcriptional errors that result from this process are unintentional.

## 2020-11-21 NOTE — Patient Instructions (Signed)
I had a long d/w patient about her recent lacunar stroke,memory loss, risk for recurrent stroke/TIAs, personally independently reviewed imaging studies and stroke evaluation results and answered questions.Continue aspirin 81 mg daily and Plavix 75 mg daily for one more week and then stop Plavix and stay on aspirin alone for secondary stroke prevention and maintain strict control of hypertension with blood pressure goal below 130/90, diabetes with hemoglobin A1c goal below 6.5% and lipids with LDL cholesterol goal below 70 mg/dL. I also advised the patient to eat a healthy diet with plenty of whole grains, cereals, fruits and vegetables, exercise regularly and maintain ideal body weight .check memory panel labs and EEG.  We discussed memory compensation strategies and advised patient to increase participation in regular cognitively challenging activities like solving crossword puzzles, playing bridge and sodoku.  Followup in the future with my nurse practitioner Shanda Bumps in 3 months or call earlier if necessary. Memory Compensation Strategies  1. Use "WARM" strategy.  W= write it down  A= associate it  R= repeat it  M= make a mental note  2.   You can keep a Glass blower/designer.  Use a 3-ring notebook with sections for the following: calendar, important names and phone numbers,  medications, doctors' names/phone numbers, lists/reminders, and a section to journal what you did  each day.   3.    Use a calendar to write appointments down.  4.    Write yourself a schedule for the day.  This can be placed on the calendar or in a separate section of the Memory Notebook.  Keeping a  regular schedule can help memory.  5.    Use medication organizer with sections for each day or morning/evening pills.  You may need help loading it  6.    Keep a basket, or pegboard by the door.  Place items that you need to take out with you in the basket or on the pegboard.  You may also want to  include a message board for  reminders.  7.    Use sticky notes.  Place sticky notes with reminders in a place where the task is performed.  For example: " turn off the  stove" placed by the stove, "lock the door" placed on the door at eye level, " take your medications" on  the bathroom mirror or by the place where you normally take your medications.  8.    Use alarms/timers.  Use while cooking to remind yourself to check on food or as a reminder to take your medicine, or as a  reminder to make a call, or as a reminder to perform another task, etc.  Stroke Prevention Some medical conditions and behaviors are associated with a higher chance of having a stroke. You can help prevent a stroke by making nutrition, lifestyle, and other changes, including managing any medical conditions you may have. What nutrition changes can be made?   Eat healthy foods. You can do this by: ? Choosing foods high in fiber, such as fresh fruits and vegetables and whole grains. ? Eating at least 5 or more servings of fruits and vegetables a day. Try to fill half of your plate at each meal with fruits and vegetables. ? Choosing lean protein foods, such as lean cuts of meat, poultry without skin, fish, tofu, beans, and nuts. ? Eating low-fat dairy products. ? Avoiding foods that are high in salt (sodium). This can help lower blood pressure. ? Avoiding foods that have saturated fat, trans fat,  and cholesterol. This can help prevent high cholesterol. ? Avoiding processed and premade foods.  Follow your health care provider's specific guidelines for losing weight, controlling high blood pressure (hypertension), lowering high cholesterol, and managing diabetes. These may include: ? Reducing your daily calorie intake. ? Limiting your daily sodium intake to 1,500 milligrams (mg). ? Using only healthy fats for cooking, such as olive oil, canola oil, or sunflower oil. ? Counting your daily carbohydrate intake. What lifestyle changes can be  made?  Maintain a healthy weight. Talk to your health care provider about your ideal weight.  Get at least 30 minutes of moderate physical activity at least 5 days a week. Moderate activity includes brisk walking, biking, and swimming.  Do not use any products that contain nicotine or tobacco, such as cigarettes and e-cigarettes. If you need help quitting, ask your health care provider. It may also be helpful to avoid exposure to secondhand smoke.  Limit alcohol intake to no more than 1 drink a day for nonpregnant women and 2 drinks a day for men. One drink equals 12 oz of beer, 5 oz of wine, or 1 oz of hard liquor.  Stop any illegal drug use.  Avoid taking birth control pills. Talk to your health care provider about the risks of taking birth control pills if: ? You are over 20 years old. ? You smoke. ? You get migraines. ? You have ever had a blood clot. What other changes can be made?  Manage your cholesterol levels. ? Eating a healthy diet is important for preventing high cholesterol. If cholesterol cannot be managed through diet alone, you may also need to take medicines. ? Take any prescribed medicines to control your cholesterol as told by your health care provider.  Manage your diabetes. ? Eating a healthy diet and exercising regularly are important parts of managing your blood sugar. If your blood sugar cannot be managed through diet and exercise, you may need to take medicines. ? Take any prescribed medicines to control your diabetes as told by your health care provider.  Control your hypertension. ? To reduce your risk of stroke, try to keep your blood pressure below 130/80. ? Eating a healthy diet and exercising regularly are an important part of controlling your blood pressure. If your blood pressure cannot be managed through diet and exercise, you may need to take medicines. ? Take any prescribed medicines to control hypertension as told by your health care  provider. ? Ask your health care provider if you should monitor your blood pressure at home. ? Have your blood pressure checked every year, even if your blood pressure is normal. Blood pressure increases with age and some medical conditions.  Get evaluated for sleep disorders (sleep apnea). Talk to your health care provider about getting a sleep evaluation if you snore a lot or have excessive sleepiness.  Take over-the-counter and prescription medicines only as told by your health care provider. Aspirin or blood thinners (antiplatelets or anticoagulants) may be recommended to reduce your risk of forming blood clots that can lead to stroke.  Make sure that any other medical conditions you have, such as atrial fibrillation or atherosclerosis, are managed. What are the warning signs of a stroke? The warning signs of a stroke can be easily remembered as BEFAST.  B is for balance. Signs include: ? Dizziness. ? Loss of balance or coordination. ? Sudden trouble walking.  E is for eyes. Signs include: ? A sudden change in vision. ? Trouble seeing.  F is for face. Signs include: ? Sudden weakness or numbness of the face. ? The face or eyelid drooping to one side.  A is for arms. Signs include: ? Sudden weakness or numbness of the arm, usually on one side of the body.  S is for speech. Signs include: ? Trouble speaking (aphasia). ? Trouble understanding.  T is for time. ? These symptoms may represent a serious problem that is an emergency. Do not wait to see if the symptoms will go away. Get medical help right away. Call your local emergency services (911 in the [place] Do not drive yourself to the hospital.  Other signs of stroke may include: ? A sudden, severe headache with no known cause. ? Nausea or vomiting. ? Seizure. Where to find more information For more information, visit:  American Stroke Association: www.strokeassociation.org  National Stroke Association:  www.stroke.org Summary  You can prevent a stroke by eating healthy, exercising, not smoking, limiting alcohol intake, and managing any medical conditions you may have.  Do not use any products that contain nicotine or tobacco, such as cigarettes and e-cigarettes. If you need help quitting, ask your health care provider. It may also be helpful to avoid exposure to secondhand smoke.  Remember BEFAST for warning signs of stroke. Get help right away if you or a loved one has any of these signs. This information is not intended to replace advice given to you by your health care provider. Make sure you discuss any questions you have with your health care provider. Document Revised: 11/20/2017 Document Reviewed: 01/13/2017 Elsevier Patient Education  2020 [employer].

## 2020-11-22 LAB — DEMENTIA PANEL
Homocysteine: 11.2 umol/L (ref 0.0–19.2)
RPR Ser Ql: NONREACTIVE
TSH: 1.33 u[IU]/mL (ref 0.450–4.500)
Vitamin B-12: 675 pg/mL (ref 232–1245)

## 2020-11-25 NOTE — Progress Notes (Signed)
Kindly inform the patient that lab work for reversible causes of memory loss was all normal

## 2020-11-26 ENCOUNTER — Other Ambulatory Visit: Payer: Medicare Other

## 2020-11-26 ENCOUNTER — Encounter: Payer: Self-pay | Admitting: *Deleted

## 2020-11-26 DIAGNOSIS — M545 Low back pain, unspecified: Secondary | ICD-10-CM | POA: Diagnosis not present

## 2020-11-26 DIAGNOSIS — M47816 Spondylosis without myelopathy or radiculopathy, lumbar region: Secondary | ICD-10-CM | POA: Diagnosis not present

## 2020-11-26 DIAGNOSIS — M48061 Spinal stenosis, lumbar region without neurogenic claudication: Secondary | ICD-10-CM | POA: Diagnosis not present

## 2020-11-27 ENCOUNTER — Ambulatory Visit (INDEPENDENT_AMBULATORY_CARE_PROVIDER_SITE_OTHER): Payer: Medicare Other | Admitting: Neurology

## 2020-11-27 DIAGNOSIS — R410 Disorientation, unspecified: Secondary | ICD-10-CM

## 2020-11-27 DIAGNOSIS — R413 Other amnesia: Secondary | ICD-10-CM

## 2020-11-28 DIAGNOSIS — I639 Cerebral infarction, unspecified: Secondary | ICD-10-CM | POA: Diagnosis not present

## 2020-12-03 NOTE — Progress Notes (Signed)
Kindly inform the patient that EEG study was normal

## 2020-12-03 NOTE — Progress Notes (Signed)
Kindly inform the patient that EEG study was normal.  No evidence of seizure activity noted.

## 2020-12-05 ENCOUNTER — Encounter: Payer: Self-pay | Admitting: *Deleted

## 2020-12-11 DIAGNOSIS — I639 Cerebral infarction, unspecified: Secondary | ICD-10-CM | POA: Diagnosis not present

## 2020-12-17 ENCOUNTER — Ambulatory Visit (INDEPENDENT_AMBULATORY_CARE_PROVIDER_SITE_OTHER): Payer: Medicare Other | Admitting: Cardiology

## 2020-12-17 ENCOUNTER — Encounter: Payer: Self-pay | Admitting: Cardiology

## 2020-12-17 ENCOUNTER — Other Ambulatory Visit: Payer: Self-pay

## 2020-12-17 VITALS — BP 140/60 | HR 75 | Ht 62.0 in | Wt 180.2 lb

## 2020-12-17 DIAGNOSIS — I10 Essential (primary) hypertension: Secondary | ICD-10-CM | POA: Diagnosis not present

## 2020-12-17 DIAGNOSIS — I639 Cerebral infarction, unspecified: Secondary | ICD-10-CM | POA: Diagnosis not present

## 2020-12-17 DIAGNOSIS — E7800 Pure hypercholesterolemia, unspecified: Secondary | ICD-10-CM | POA: Diagnosis not present

## 2020-12-17 NOTE — Patient Instructions (Signed)

## 2020-12-17 NOTE — Progress Notes (Signed)
Cardiology Office Note:    Date:  12/17/2020   ID:  [name], Dr., DOB Dec 26, 1940, MRN [id number]  PCP:  Merlene Laughter, MD  [facility] Cardiologist:  Debbe Odea, MD  [facility] Electrophysiologist:  None   Referring MD: Merlene Laughter, MD   Chief Complaint  Patient presents with  . Other    CVA . Meds reviewed verbally with pt.    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 female with a hx of hypertension, hyperlipidemia, CVA who presents for cardiac monitor results.  She was admitted 11/06/2020 for right facial droop and slurred speech.  MRI revealed an acute left lacunar stroke.  Seen via neurology, aspirin and Plavix x3 weeks recommended then Plavix only after.  Lipitor also started.  Echocardiogram 11/07/2020 showed normal systolic function, impaired relaxation.  Cardiac monitor was placed x4 weeks at 2 weeks intervals each.  Her facial droop is currently resolved, she still has minor slurred speech and working with speech therapy.  Denies palpitations.  Has chronic back and hip pain.  She is working on eating healthier and also losing some weight.  Past Medical History:  Diagnosis Date  . Acne rosacea   . Allergic rhinitis   . Chronic kidney disease    kidney stones, Nov. 2013- tx with Lithotripsy, still has some present   . Cough variant asthma   . Degenerative disc disease    knees, ankles, back- OA  . GERD (gastroesophageal reflux disease)   . Headache(784.0)    since stopping aleve- 07/30/2013  . Hyperlipemia    dyslipidemia  . Hypertension    [facility]ist follows pt. Riki Rusk Smart ,has never taken anti-hypertensive  . Osteoarthritis   . Plantar fasciitis    (using orthotics-they are wearing out)- Dr Luther Bradley  . Pneumonia    double pneumonia - relative to sinus infection , had chemical cauterization   . PONV (postoperative nausea and vomiting)    urgency, N&V for days, memory & processing  . Sleep  apnea    Bringing cpap mask and tubing, study last done- 2006    Past Surgical History:  Procedure Laterality Date  . ABDOMINAL HYSTERECTOMY  1990's  . BLADDER REPAIR    . BLADDER SUSPENSION  90's  . MAXIMUM ACCESS (MAS)POSTERIOR LUMBAR INTERBODY FUSION (PLIF) 2 LEVEL N/A 08/12/2013   Procedure: FOR MAXIMUM ACCESS (MAS) POSTERIOR LUMBAR INTERBODY FUSION (PLIF) 2 LEVEL;  Surgeon: Tia Alert, MD;  Location: [facility];  Service: Neurosurgery;  Laterality: N/A;  FOR MAXIMUM ACCESS (MAS) POSTERIOR LUMBAR INTERBODY FUSION (PLIF) 2 LEVEL  . MULTIPLE TOOTH EXTRACTIONS     wisdom teeth extractions   . POSTERIOR FUSION LUMBAR SPINE  08/12/2013   Dr Yetta Barre  . TONSILLECTOMY      Current Medications: Current Meds  Medication Sig  . acetaminophen (TYLENOL) 500 MG tablet Take 500 mg by mouth every 6 (six) hours as needed for pain.  Marland Kitchen alendronate (FOSAMAX) 70 MG tablet Take 70 mg by mouth once a week. Take with a full glass of water on an empty stomach.  Marland Kitchen amLODipine (NORVASC) 5 MG tablet Take 5 mg by mouth daily.  Marland Kitchen atorvastatin (LIPITOR) 40 MG tablet Take 1 tablet (40 mg total) by mouth at bedtime.  . clopidogrel (PLAVIX) 75 MG tablet Take 1 tablet (75 mg total) by mouth daily.  . Cobalamin Combinations (NEURIVA PLUS) CAPS Take by mouth.  . fenofibrate (TRICOR) 145 MG  tablet Take 1 tablet by mouth daily, need MD visit for further refills  . fluticasone (FLONASE) 50 MCG/ACT nasal spray Place 2 sprays into the nose daily as needed. Allergic rhinitis.  . magnesium oxide (MAG-OX) 400 MG tablet Take 400 mg by mouth daily.  . Multiple Vitamin (MULTIVITAMIN WITH MINERALS) TABS Take 1 tablet by mouth daily.  Marland Kitchen omeprazole (PRILOSEC) 20 MG capsule Take 20 mg by mouth daily.  Marland Kitchen tretinoin (RETIN-A) 0.05 % cream Apply 1 application topically 2 (two) times daily.  Marland Kitchen UNABLE TO FIND GOLI Applecider Vinegar  . valsartan-hydrochlorothiazide (DIOVAN-HCT) 80-12.5 MG tablet TK 1 T PO QD     Allergies:   Other,  Oxycodone, Sulfa antibiotics, and Sulfasalazine   Social History   Socioeconomic History  . Marital status: Divorced    Spouse name: Not on file  . Number of children: Not on file  . Years of education: Not on file  . Highest education level: Not on file  Occupational History  . Occupation: retired  Tobacco Use  . Smoking status: Never Smoker  . Smokeless tobacco: Never Used  Substance and Sexual Activity  . Alcohol use: Yes    Comment: on occassion  . Drug use: No  . Sexual activity: Not on file  Other Topics Concern  . Not on file  Social History Narrative   Lives alone   Left Handed   Drinks 2-3 cups caffeine daily   Social Determinants of Health   Financial Resource Strain: Not on file  Food Insecurity: Not on file  Transportation Needs: Not on file  Physical Activity: Not on file  Stress: Not on file  Social Connections: Not on file     Family History: The patient's family history includes CVA in her father; Heart Problems in her father and mother; Heart attack in her father; Heart disease in her father.  ROS:   Please see the history of present illness.     All other systems reviewed and are negative.  EKGs/Labs/Other Studies Reviewed:    The following studies were reviewed today:   EKG:  EKG is  ordered today.  The ekg ordered today demonstrates normal sinus rhythm, normal ECG  Recent Labs: 11/06/2020: ALT 22; BUN 25; Creatinine, Ser 1.25; Hemoglobin 12.4; Platelets 255; Potassium 4.1; Sodium 139 11/21/2020: TSH 1.330  Recent Lipid Panel    Component Value Date/Time   CHOL 177 11/07/2020 0415   TRIG 215 (H) 11/07/2020 0415   HDL 47 11/07/2020 0415   CHOLHDL 3.8 11/07/2020 0415   VLDL 43 (H) 11/07/2020 0415   LDLCALC 87 11/07/2020 0415   LDLDIRECT 105.0 05/07/2015 0813     Risk Assessment/Calculations:      Physical Exam:    VS:  BP 140/60 (BP Location: Right Arm, Patient Position: Sitting, Cuff Size: Normal)   Pulse 75   Ht 5\' 2"  (1.575  m)   Wt 180 lb 4 oz (81.8 kg)   SpO2 98%   BMI 32.97 kg/m     Wt Readings from Last 3 Encounters:  12/17/20 180 lb 4 oz (81.8 kg)  11/21/20 181 lb (82.1 kg)  11/06/20 179 lb (81.2 kg)     GEN:  Well nourished, well developed in no acute distress HEENT: Normal NECK: No JVD; No carotid bruits LYMPHATICS: No lymphadenopathy CARDIAC: RRR, no murmurs, rubs, gallops RESPIRATORY:  Clear to auscultation without rales, wheezing or rhonchi  ABDOMEN: Soft, non-tender, non-distended MUSCULOSKELETAL:  No edema; No deformity  SKIN: Warm and dry NEUROLOGIC:  Alert and oriented x 3 PSYCHIATRIC:  Normal affect   ASSESSMENT:    1. Cerebrovascular accident (CVA), unspecified mechanism (HCC)   2. Primary hypertension   3. Pure hypercholesterolemia    PLAN:    In order of problems listed above:  1. CVA, left coronary radiata/lacunar infarct.  Cardiac monitor with no A. fib or flutter.  Paroxysmal SVT noted.  Echo with preserved ejection fraction.  Continue Lipitor, Plavix as per neuro recommendations.  2. Hypertension, BP okay.  Continue current meds.  May consider increasing HCTZ for better BP control if blood pressure stays elevated.  Low-salt diet, weight loss advised. 3. Hyperlipidemia, last cholesterol levels improved.  Continue Lipitor.   Follow-up in 1 year.   Medication Adjustments/Labs and Tests Ordered: Current medicines are reviewed at length with the patient today.  Concerns regarding medicines are outlined above.  Orders Placed This Encounter  Procedures  . EKG 12-Lead   No orders of the defined types were placed in this encounter.   Patient Instructions  Medication Instructions:   Your physician recommends that you continue on your current medications as directed. Please refer to the Current Medication list given to you today.  *If you need a refill on your cardiac medications before your next appointment, please call your pharmacy*   Lab Work: None Ordered If  you have labs (blood work) drawn today and your tests are completely normal, you will receive your results only by: Marland Kitchen MyChart Message (if you have MyChart) OR . A paper copy in the mail If you have any lab test that is abnormal or we need to change your treatment, we will call you to review the results.   Testing/Procedures: None Ordered   Follow-Up: At [facility], you and your health needs are our priority.  As part of our continuing mission to provide you with exceptional heart care, we have created designated Provider Care Teams.  These Care Teams include your primary Cardiologist (physician) and Advanced Practice Providers (APPs -  Physician Assistants and Nurse Practitioners) who all work together to provide you with the care you need, when you need it.  We recommend signing up for the patient portal called "MyChart".  Sign up information is provided on this After Visit Summary.  MyChart is used to connect with patients for Virtual Visits (Telemedicine).  Patients are able to view lab/test results, encounter notes, upcoming appointments, etc.  Non-urgent messages can be sent to your provider as well.   To learn more about what you can do with MyChart, go to [email or web address].    Your next appointment:   1 year(s)  The format for your next appointment:   In Person  Provider:   Debbe Odea, MD   Other Instructions      Signed, Debbe Odea, MD  12/17/2020 1:07 PM    [facility]

## 2020-12-18 DIAGNOSIS — Z79899 Other long term (current) drug therapy: Secondary | ICD-10-CM | POA: Diagnosis not present

## 2021-01-10 DIAGNOSIS — N1831 Chronic kidney disease, stage 3a: Secondary | ICD-10-CM | POA: Diagnosis not present

## 2021-01-10 DIAGNOSIS — I129 Hypertensive chronic kidney disease with stage 1 through stage 4 chronic kidney disease, or unspecified chronic kidney disease: Secondary | ICD-10-CM | POA: Diagnosis not present

## 2021-02-25 DIAGNOSIS — I129 Hypertensive chronic kidney disease with stage 1 through stage 4 chronic kidney disease, or unspecified chronic kidney disease: Secondary | ICD-10-CM | POA: Diagnosis not present

## 2021-02-25 DIAGNOSIS — J3489 Other specified disorders of nose and nasal sinuses: Secondary | ICD-10-CM | POA: Diagnosis not present

## 2021-02-25 DIAGNOSIS — N3941 Urge incontinence: Secondary | ICD-10-CM | POA: Diagnosis not present

## 2021-02-25 DIAGNOSIS — N1831 Chronic kidney disease, stage 3a: Secondary | ICD-10-CM | POA: Diagnosis not present

## 2021-03-14 DIAGNOSIS — L728 Other follicular cysts of the skin and subcutaneous tissue: Secondary | ICD-10-CM | POA: Diagnosis not present

## 2021-03-14 DIAGNOSIS — L821 Other seborrheic keratosis: Secondary | ICD-10-CM | POA: Diagnosis not present

## 2021-03-14 DIAGNOSIS — L570 Actinic keratosis: Secondary | ICD-10-CM | POA: Diagnosis not present

## 2021-03-14 DIAGNOSIS — D229 Melanocytic nevi, unspecified: Secondary | ICD-10-CM | POA: Diagnosis not present

## 2021-03-20 NOTE — Progress Notes (Signed)
[facility] [address] [place]. [place] [address] [phone]       STROKE FOLLOW UP NOTE  [name], Dr. Date of Birth:  [birth year] Medical Record Number:  [id number]   Referring MD: Shelby Dubin  Reason for Referral:  Stroke  Chief Complaint  Patient presents with  . Follow-up    Rm 14 alone Pt is well, having difficulty with some slurred speech, R side facial droop hasn't gotten better       HPI:   Today, 03/20/2021, Lisa [name] returns for 79-month stroke follow-up  Stable since prior visit without new stroke/TIA symptoms Reports residual mild occasional slurred speech and right lower facial weakness Slurred speech typically present when speaking in longer sentences or she is thinking about other things while talking -she currently resides at [facility] and plans on seeing if they have a speech therapist who she can further work with  Compliant on Plavix and atorvastatin -denies associated side effects Blood pressure today 129/64 Reports recent lab work by PCP which is satisfactory Reports compliance nightly with CPAP for OSA management  Short-term memory difficulties present prior to her stroke and denies worsening post stroke - EEG and dementia panel unremarkable.  Memory has been stable since prior visit  No further concerns at this time     History provided for reference purposes Initial visit 11/21/2020 Dr. Pearlean Brownie: [name] is a pleasant 80 year old Caucasian lady seen today for initial office consultation visit for stroke.  History is obtained from the patient, review of electronic medical records and I personally reviewed imaging films in PACS.  She has past medical history for hyperlipidemia, hypertension, obstructive sleep apnea, stage III chronic kidney disease who presented to [facility], ER on 11/06/2020 when she noticed sudden onset of right facial droop and slurred speech upon awakening from sleep.  She presented beyond  time window for TPA.  Symptoms quickly resolved after arrival.  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  Urine drug screen was positive for ecstasy but patient declined use.  LDL cholesterol is 88 mg percent.  Hemoglobin A1c was 6.1.  2D echo showed ejection fraction of 55 to 60%.  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  MRA neck was unremarkable.  Patient was started on aspirin Plavix Lipitor.  She states she is tolerating them well with does have minor bruising.  Blood pressures well controlled in the 130s at home though it is elevated in the office today at 155/70.  She has had no recurrent stroke or TIA symptoms.  Her main complaint is her chronic back pain she has undergone spine surgery as well as multiple epidural injections her walking is difficult.  She has noticed some increased short-term memory difficulties and she often loses words and has word finding difficulties she had this even prior to the stroke but seems to be more pronounced.  She has no family Struve Alzheimer's.  ROS:   14 system review of systems is positive for those listed in HPI and all other systems negative   PMH:  Past Medical History:  Diagnosis Date  . Acne rosacea   . Allergic rhinitis   . Chronic kidney disease    kidney stones, Nov. 2013- tx with Lithotripsy, still has some present   . Cough variant asthma   . Degenerative disc disease  knees, ankles, back- OA  . GERD (gastroesophageal reflux disease)   . Headache(784.0)    since stopping aleve- 07/30/2013  . Hyperlipemia    dyslipidemia  . Hypertension    [facility] pharmacist follows pt. Riki Rusk Smart ,has never taken anti-hypertensive  . Osteoarthritis   . Plantar fasciitis    (using orthotics-they are wearing out)- Dr Luther Bradley  . Pneumonia    double pneumonia - relative to sinus infection , had chemical  cauterization   . PONV (postoperative nausea and vomiting)    urgency, N&V for days, memory & processing  . Sleep apnea    Bringing cpap mask and tubing, study last done- 2006    Social History:  Social History   Socioeconomic History  . Marital status: Divorced    Spouse name: Not on file  . Number of children: Not on file  . Years of education: Not on file  . Highest education level: Not on file  Occupational History  . Occupation: retired  Tobacco Use  . Smoking status: Never Smoker  . Smokeless tobacco: Never Used  Substance and Sexual Activity  . Alcohol use: Yes    Comment: on occassion  . Drug use: No  . Sexual activity: Not on file  Other Topics Concern  . Not on file  Social History Narrative   Lives alone   Left Handed   Drinks 2-3 cups caffeine daily   Social Determinants of Health   Financial Resource Strain: Not on file  Food Insecurity: Not on file  Transportation Needs: Not on file  Physical Activity: Not on file  Stress: Not on file  Social Connections: Not on file  Intimate Partner Violence: Not on file    Medications:   Current Outpatient Medications on File Prior to Visit  Medication Sig Dispense Refill  . acetaminophen (TYLENOL) 500 MG tablet Take 500 mg by mouth every 6 (six) hours as needed for pain.    Marland Kitchen alendronate (FOSAMAX) 70 MG tablet Take 70 mg by mouth once a week. Take with a full glass of water on an empty stomach.    Marland Kitchen amLODipine (NORVASC) 5 MG tablet Take 5 mg by mouth daily.    Marland Kitchen atorvastatin (LIPITOR) 40 MG tablet Take 1 tablet (40 mg total) by mouth at bedtime. 90 tablet 0  . clopidogrel (PLAVIX) 75 MG tablet Take 1 tablet (75 mg total) by mouth daily. 90 tablet 0  . Cobalamin Combinations (NEURIVA PLUS) CAPS Take by mouth.    . fenofibrate (TRICOR) 145 MG tablet Take 1 tablet by mouth daily, need MD visit for further refills 90 tablet 0  . fluticasone (FLONASE) 50 MCG/ACT nasal spray Place 2 sprays into the nose daily as  needed. Allergic rhinitis.    . magnesium oxide (MAG-OX) 400 MG tablet Take 400 mg by mouth daily.    . mirabegron ER (MYRBETRIQ) 25 MG TB24 tablet Take 25 mg by mouth daily.    . Multiple Vitamin (MULTIVITAMIN WITH MINERALS) TABS Take 1 tablet by mouth daily.    Marland Kitchen omeprazole (PRILOSEC) 20 MG capsule Take 20 mg by mouth daily.    Marland Kitchen tretinoin (RETIN-A) 0.05 % cream Apply 1 application topically 2 (two) times daily.    Marland Kitchen UNABLE TO FIND GOLI Applecider Vinegar    . valsartan-hydrochlorothiazide (DIOVAN-HCT) 320-12.5 MG tablet TK 1 T PO QD  11   No current facility-administered medications on file prior to visit.    Allergies:   Allergies  Allergen Reactions  .  Other Other (See Comments)    Anesthesia causes nausea and vomiting, memory difficulty and problems processing.  . Oxycodone Nausea Only  . Sulfa Antibiotics Rash  . Sulfasalazine Rash    Physical Exam Today's Vitals   03/21/21 1231  BP: 129/64  Pulse: 68  Weight: 178 lb (80.7 kg)  Height: 5\' 2"  (1.575 m)   Body mass index is 32.56 kg/m.  General: well developed, well nourished pleasant elderly Caucasian lady, seated, in no evident distress Head: head normocephalic and atraumatic.   Neck: supple with no carotid or supraclavicular bruits Cardiovascular: regular rate and rhythm, no murmurs Musculoskeletal: no deformity Skin:  no rash/petichiae Vascular:  Normal pulses all extremities  Neurologic Exam Mental Status: Awake and fully alert.  Occasional dysarthria with speech hesitancy.  Oriented to place and time. Recent memory subjectively impaired and remote memory intact. Attention span, concentration and fund of knowledge appropriate. Mood and affect appropriate.  Cranial Nerves: Pupils equal, briskly reactive to light. Extraocular movements full without nystagmus. Visual fields full to confrontation. Hearing intact. Facial sensation intact. R lower facial weakness only noticeable when attempting to puff cheeks.  Tongue  and palate moves normally and symmetrically.  Motor: Normal bulk and tone. Normal strength in all tested extremity muscles. Sensory.: intact to touch , pinprick , position and vibratory sensation.  Coordination: Rapid alternating movements normal in all extremities. Finger-to-nose and heel-to-shin performed accurately bilaterally. Gait and Station: Arises from chair without difficulty. Stance is normal. Gait demonstrates normal stride length and balance without use of assistive device. Able to heel, toe and tandem walk without difficulty.  Reflexes: 1+ and symmetric. Toes downgoing.       ASSESSMENT/PLAN: 80 year old Caucasian lady with left subcortical lacunar infarct in November 2021 with vascular risk factors of hypertension hyperlipidemia and age.  She has mild cognitive impairment    L subcortical stroke -Residual mild occasional dysarthria and right lower facial weakness -advised her to call office if she needs any assistance with starting speech therapy -Continue Plavix and atorvastatin 40 mg daily for secondary stroke prevention -Advised to discontinue omeprazole as she is on plavix. Use of pantoprazole okay to use for GERD symptoms. -maintain strict control of hypertension with blood pressure goal below 130/90 and lipids with LDL cholesterol goal below 70 mg/dL.  Mild cognitive impairment -stable -EEG 11/2020 normal -dementia panel 11/2020 WNL -discussed memory compensation strategies and advised patient to increase participation in regular cognitively challenging activities like solving crossword puzzles, playing bridge and sodoku.      Follow-up in 6 months or call earlier if needed    CC:  [facility] provider: Dr. 12/2020, Darylene Price, MD   I spent 30 minutes of face-to-face and non-face-to-face time with patient.  This included previsit chart review, lab review, study review, order entry, electronic health record documentation, patient education and discussion regarding  history of prior stroke, importance of managing stroke risk factors, mild cognitive impairment and education and answered all other questions to patient satisfaction  Ann Maki, [facility]  [facility] [address] [place], [place]  Phone [phone] Fax [phone] Note: This document was prepared with digital dictation and possible smart phrase technology. Any transcriptional errors that result from this process are unintentional.

## 2021-03-21 ENCOUNTER — Other Ambulatory Visit: Payer: Self-pay

## 2021-03-21 ENCOUNTER — Ambulatory Visit (INDEPENDENT_AMBULATORY_CARE_PROVIDER_SITE_OTHER): Payer: Medicare Other | Admitting: Adult Health

## 2021-03-21 ENCOUNTER — Encounter: Payer: Self-pay | Admitting: Adult Health

## 2021-03-21 VITALS — BP 129/64 | HR 68 | Ht 62.0 in | Wt 178.0 lb

## 2021-03-21 DIAGNOSIS — I639 Cerebral infarction, unspecified: Secondary | ICD-10-CM

## 2021-03-21 DIAGNOSIS — G3184 Mild cognitive impairment, so stated: Secondary | ICD-10-CM

## 2021-03-21 NOTE — Patient Instructions (Signed)
Continue clopidogrel 75 mg daily  and atorvastatin for secondary stroke prevention  Recommend stopping omeprazole as this can lower the effects of Plavix - if GERD symptoms return, would recommend starting pantoprazole (Protonix) which can be obtained by your PCP or myself  If orders are needed for speech therapy, please let me know when I am more than happy to assist!  Continue to follow up with PCP regarding cholesterol and blood pressure management  Maintain strict control of hypertension with blood pressure goal below 130/90 and cholesterol with LDL cholesterol (bad cholesterol) goal below 70 mg/dL.       Followup in the future with me in 6 months or call earlier if needed       Thank you for coming to see [place] at [facility]. I hope we have been able to provide you high quality care today.  You may receive a patient satisfaction survey over the next few weeks. We would appreciate your feedback and comments so that we may continue to improve ourselves and the health of our patients.     Mild Neurocognitive Disorder Mild neurocognitive disorder, formerly known as mild cognitive impairment, is a disorder in which memory does not work as well as it should. This disorder may also cause problems with other mental functions, including thought, communication, behavior, and completion of tasks. These problems can be noticed and measured, but they usually do not interfere with daily activities or the ability to live independently. Mild neurocognitive disorder typically develops after 79 years of age, but it can also develop at younger ages. It is not as serious as major neurocognitive disorder, also known as dementia, but it may be the first sign of it. Generally, symptoms of this condition get worse over time. In rare cases, symptoms can get better. What are the causes? This condition may be caused by:  Brain disorders like Alzheimer's disease, Parkinson's disease, and  other conditions that gradually damage nerve cells (neurodegenerative conditions).  Diseases that affect blood vessels in the brain and result in small strokes.  Certain infections, such as HIV.  Traumatic brain injury.  Other medical conditions, such as brain tumors, underactive thyroid (hypothyroidism), and vitamin B12 deficiency.  Use of certain drugs or prescription medicines. What increases the risk? The following factors may make you more likely to develop this condition:  Being older than 65 years.  Being female.  Low education level.  Diabetes, high blood pressure, high cholesterol, and other conditions that increase the risk for blood vessel diseases.  Untreated or undertreated sleep apnea.  Having a certain type of gene that can be passed from parent to child (inherited).  Chronic health problems such as heart disease, lung disease, liver disease, kidney disease, or depression. What are the signs or symptoms? Symptoms of this condition include:  Difficulty remembering. You may: ? Forget names, phone numbers, or details of recent events. ? Forget social events and appointments. ? Repeatedly forget where you put your car keys or other items.  Difficulty thinking and solving problems. You may have trouble with complex tasks, such as: ? Paying bills. ? Driving in unfamiliar places.  Difficulty communicating. You may have trouble: ? Finding the right word or naming an object. ? Forming a sentence that makes sense, or understanding what you read or hear.  Changes in your behavior or personality. When this happens, you may: ? Lose interest in the things that you used to enjoy. ? Withdraw from social situations. ? Get angry more easily  than usual. ? Act before thinking. How is this diagnosed? This condition is diagnosed based on:  Your symptoms. Your health care provider may ask you and the people you spend time with, such as family and friends, about your  symptoms.  Evaluation of mental functions (neuropsychological testing). Your health care provider may refer you to a neurologist or mental health specialist to evaluate your mental functions in detail. To identify the cause of your condition, your health care provider may:  Get a detailed medical history.  Ask about use of alcohol, drugs, and prescription medicines.  Do a physical exam.  Order blood tests and brain imaging exams. How is this treated? Mild neurocognitive disorder that is caused by medicine use, drug use, infection, or another medical condition may improve when the cause is treated, or when medicines or drugs are stopped. If this disorder has another cause, it generally does not improve and may get worse. In these cases, the goal of treatment is to help you manage the loss of mental function. Treatments in these cases include:  Medicine. Medicine mainly helps memory and behavior symptoms.  Talk therapy. Talk therapy provides education, emotional support, memory aids, and other ways of making up for problems with mental function.  Lifestyle changes, including: ? Getting regular exercise. ? Eating a healthy diet that includes omega-3 fatty acids. ? Challenging your thinking and memory skills. ? Having more social interaction. Follow these instructions at home: Eating and drinking  Drink enough fluid to keep your urine pale yellow.  Eat a healthy diet that includes omega-3 fatty acids. These can be found in: ? Fish. ? Nuts. ? Leafy vegetables. ? Vegetable oils.  If you drink alcohol: ? Limit how much you use to:  0-1 drink a day for women.  0-2 drinks a day for men. ? Be aware of how much alcohol is in your drink. In the [place] one drink equals one 12 oz bottle of beer (355 mL), one 5 oz glass of wine (148 mL), or one 1 oz glass of hard liquor (44 mL).   Lifestyle  Get regular exercise as told by your health care provider.  Do not use any products that  contain nicotine or tobacco, such as cigarettes, e-cigarettes, and chewing tobacco. If you need help quitting, ask your health care provider.  Practice ways to manage stress. If you need help managing stress, ask your health care provider.  Continue to have social interaction.  Keep your mind active with stimulating activities you enjoy, such as reading or playing games.  Make sure to get quality sleep. Follow these tips: ? Avoid napping during the day. ? Keep your sleeping area dark and cool. ? Avoid exercising during the few hours before you go to bed. ? Avoid caffeine products in the evening.   General instructions  Take over-the-counter and prescription medicines only as told by your health care provider. Your health care provider may recommend that you avoid taking medicines that can affect thinking, such as pain medicines or sleep medicines.  Work with your health care provider to find out what you need help with and what your safety needs are.  Keep all follow-up visits. This is important. Where to find more information  [employer] on Aging: https://walker.com/ Contact a health care provider if:  You have any new symptoms. Get help right away if:  You develop new confusion or your confusion gets worse.  You act in ways that place you or your family in danger.  Summary  Mild neurocognitive disorder is a disorder in which memory does not work as well as it should.  Mild neurocognitive disorder can have many causes. It may be the first stage of dementia.  To manage your condition, get regular exercise, keep your mind active, get quality sleep, and eat a healthy diet. This information is not intended to replace advice given to you by your health care provider. Make sure you discuss any questions you have with your health care provider. Document Revised: 04/23/2020 Document Reviewed: 04/23/2020 Elsevier Patient Education  2021 [employer].

## 2021-03-21 NOTE — Progress Notes (Signed)
I agree with the above plan 

## 2021-04-16 DIAGNOSIS — G4733 Obstructive sleep apnea (adult) (pediatric): Secondary | ICD-10-CM | POA: Diagnosis not present

## 2021-04-19 DIAGNOSIS — K219 Gastro-esophageal reflux disease without esophagitis: Secondary | ICD-10-CM | POA: Diagnosis not present

## 2021-04-19 DIAGNOSIS — N1831 Chronic kidney disease, stage 3a: Secondary | ICD-10-CM | POA: Diagnosis not present

## 2021-04-19 DIAGNOSIS — I129 Hypertensive chronic kidney disease with stage 1 through stage 4 chronic kidney disease, or unspecified chronic kidney disease: Secondary | ICD-10-CM | POA: Diagnosis not present

## 2021-04-19 DIAGNOSIS — E785 Hyperlipidemia, unspecified: Secondary | ICD-10-CM | POA: Diagnosis not present

## 2021-05-03 DIAGNOSIS — E785 Hyperlipidemia, unspecified: Secondary | ICD-10-CM | POA: Diagnosis not present

## 2021-05-03 DIAGNOSIS — K219 Gastro-esophageal reflux disease without esophagitis: Secondary | ICD-10-CM | POA: Diagnosis not present

## 2021-05-03 DIAGNOSIS — N1831 Chronic kidney disease, stage 3a: Secondary | ICD-10-CM | POA: Diagnosis not present

## 2021-05-03 DIAGNOSIS — G8929 Other chronic pain: Secondary | ICD-10-CM | POA: Diagnosis not present

## 2021-05-27 DIAGNOSIS — I129 Hypertensive chronic kidney disease with stage 1 through stage 4 chronic kidney disease, or unspecified chronic kidney disease: Secondary | ICD-10-CM | POA: Diagnosis not present

## 2021-05-27 DIAGNOSIS — N1831 Chronic kidney disease, stage 3a: Secondary | ICD-10-CM | POA: Diagnosis not present

## 2021-05-27 DIAGNOSIS — N3941 Urge incontinence: Secondary | ICD-10-CM | POA: Diagnosis not present

## 2021-05-27 DIAGNOSIS — R059 Cough, unspecified: Secondary | ICD-10-CM | POA: Diagnosis not present

## 2021-06-27 DIAGNOSIS — N1831 Chronic kidney disease, stage 3a: Secondary | ICD-10-CM | POA: Diagnosis not present

## 2021-06-27 DIAGNOSIS — K219 Gastro-esophageal reflux disease without esophagitis: Secondary | ICD-10-CM | POA: Diagnosis not present

## 2021-06-27 DIAGNOSIS — E785 Hyperlipidemia, unspecified: Secondary | ICD-10-CM | POA: Diagnosis not present

## 2021-06-27 DIAGNOSIS — I129 Hypertensive chronic kidney disease with stage 1 through stage 4 chronic kidney disease, or unspecified chronic kidney disease: Secondary | ICD-10-CM | POA: Diagnosis not present

## 2021-07-03 DIAGNOSIS — H02831 Dermatochalasis of right upper eyelid: Secondary | ICD-10-CM | POA: Diagnosis not present

## 2021-07-03 DIAGNOSIS — H1045 Other chronic allergic conjunctivitis: Secondary | ICD-10-CM | POA: Diagnosis not present

## 2021-07-03 DIAGNOSIS — H43813 Vitreous degeneration, bilateral: Secondary | ICD-10-CM | POA: Diagnosis not present

## 2021-07-03 DIAGNOSIS — H02834 Dermatochalasis of left upper eyelid: Secondary | ICD-10-CM | POA: Diagnosis not present

## 2021-07-19 DIAGNOSIS — G4733 Obstructive sleep apnea (adult) (pediatric): Secondary | ICD-10-CM | POA: Diagnosis not present

## 2021-08-02 DIAGNOSIS — N3941 Urge incontinence: Secondary | ICD-10-CM | POA: Diagnosis not present

## 2021-09-09 DIAGNOSIS — N3941 Urge incontinence: Secondary | ICD-10-CM | POA: Diagnosis not present

## 2021-09-13 DIAGNOSIS — Z1231 Encounter for screening mammogram for malignant neoplasm of breast: Secondary | ICD-10-CM | POA: Diagnosis not present

## 2021-09-25 ENCOUNTER — Ambulatory Visit (INDEPENDENT_AMBULATORY_CARE_PROVIDER_SITE_OTHER): Payer: Medicare Other | Admitting: Adult Health

## 2021-09-25 ENCOUNTER — Encounter: Payer: Self-pay | Admitting: Adult Health

## 2021-09-25 ENCOUNTER — Other Ambulatory Visit: Payer: Self-pay

## 2021-09-25 VITALS — BP 132/60 | HR 61 | Ht 62.0 in | Wt 176.0 lb

## 2021-09-25 DIAGNOSIS — I639 Cerebral infarction, unspecified: Secondary | ICD-10-CM | POA: Diagnosis not present

## 2021-09-25 DIAGNOSIS — G3184 Mild cognitive impairment, so stated: Secondary | ICD-10-CM | POA: Diagnosis not present

## 2021-09-25 NOTE — Progress Notes (Signed)
[facility] [address] [place]. [place] [address] [phone]       STROKE FOLLOW UP NOTE  Lisa [name], Dr. Date of Birth:  May 09, 1941 Medical Record Number:  [id number]   Referring MD: Shelby Dubin  Reason for Referral:  Stroke  Chief Complaint  Patient presents with   Follow-up    Rm 3 alone here for 6 month f/u reports she has been doing well. Reports right skin droop is more noticeable on the right side.Would like to discuss if it would be an option for her to pursue eye lid surgery at this point. Rpeorts voice is deeper now than before her stroke.       HPI  Update 09/25/2021: Returns for 66-month stroke follow-up.  Overall doing well. C/o voice not as high pitched - noticed more so in the last few months. Planned to have eye lid repair prior to her stroke - questions if she is able to pursue procedure now. Speech has greatly improved. Denies new stroke/TIA symptoms. Remains on plavix and atorvastatin - denies side effects. Blood pressure 132/60 - monitors at home- has been stable.  Routinely follows with PCP Dr. Pete Glatter with routine lab work which has been satisfactory (unable to view via epic).  Compliant on CPAP nightly.  No further concerns at this time.    History provided for reference purposes only Update 03/20/2021 JM: Lisa [name] returns for 65-month stroke follow-up  Stable since prior visit without new stroke/TIA symptoms Reports residual mild occasional slurred speech and right lower facial weakness Slurred speech typically present when speaking in longer sentences or she is thinking about other things while talking -she currently resides at [facility] and plans on seeing if they have a speech therapist who she can further work with  Compliant on Plavix and atorvastatin -denies associated side effects Blood pressure today 129/64 Reports recent lab work by PCP which is satisfactory Reports compliance nightly with CPAP for OSA  management  Short-term memory difficulties present prior to her stroke and denies worsening post stroke - EEG and dementia panel unremarkable.  Memory has been stable since prior visit  No further concerns at this time  Initial visit 11/21/2020 Dr. Pearlean Brownie: Lisa [name] is a pleasant 80 year old Caucasian lady seen today for initial office consultation visit for stroke.  History is obtained from the patient, review of electronic medical records and I personally reviewed imaging films in PACS.  She has past medical history for hyperlipidemia, hypertension, obstructive sleep apnea, stage III chronic kidney disease who presented to [facility], ER on 11/06/2020 when she noticed sudden onset of right facial droop and slurred speech upon awakening from sleep.  She presented beyond time window for TPA.  Symptoms quickly resolved after arrival.  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  Urine drug screen was positive for ecstasy but patient declined use.  LDL cholesterol is 88 mg percent.  Hemoglobin A1c was 6.1.  2D echo showed ejection fraction of 55 to 60%.  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  MRA neck was unremarkable.  Patient was started on aspirin Plavix Lipitor.  She states she is tolerating them well with does have minor bruising.  Blood pressures well controlled in the 130s at home though it is elevated in the office today at 155/70.  She has had no recurrent stroke or TIA  symptoms.  Her main complaint is her chronic back pain she has undergone spine surgery as well as multiple epidural injections her walking is difficult.  She has noticed some increased short-term memory difficulties and she often loses words and has word finding difficulties she had this even prior to the stroke but seems to be more pronounced.  She has no family Struve Alzheimer's.  ROS:   14 system  review of systems is positive for those listed in HPI and all other systems negative   PMH:  Past Medical History:  Diagnosis Date   Acne rosacea    Allergic rhinitis    Chronic kidney disease    kidney stones, Nov. 2013- tx with Lithotripsy, still has some present    Cough variant asthma    Degenerative disc disease    knees, ankles, back- OA   GERD (gastroesophageal reflux disease)    Headache(784.0)    since stopping aleve- 07/30/2013   Hyperlipemia    dyslipidemia   Hypertension    [facility] pharmacist follows pt. Riki Rusk Smart ,has never taken anti-hypertensive   Osteoarthritis    Plantar fasciitis    (using orthotics-they are wearing out)- Dr Luther Bradley   Pneumonia    double pneumonia - relative to sinus infection , had chemical cauterization    PONV (postoperative nausea and vomiting)    urgency, N&V for days, memory & processing   Sleep apnea    Bringing cpap mask and tubing, study last done- 2006    Social History:  Social History   Socioeconomic History   Marital status: Divorced    Spouse name: Not on file   Number of children: Not on file   Years of education: Not on file   Highest education level: Not on file  Occupational History   Occupation: retired  Tobacco Use   Smoking status: Never   Smokeless tobacco: Never  Substance and Sexual Activity   Alcohol use: Yes    Comment: on occassion   Drug use: No   Sexual activity: Not on file  Other Topics Concern   Not on file  Social History Narrative   Lives alone   Left Handed   Drinks 2-3 cups caffeine daily   Social Determinants of Health   Financial Resource Strain: Not on file  Food Insecurity: Not on file  Transportation Needs: Not on file  Physical Activity: Not on file  Stress: Not on file  Social Connections: Not on file  Intimate Partner Violence: Not on file    Medications:   Current Outpatient Medications on File Prior to Visit  Medication Sig Dispense Refill   acetaminophen  (TYLENOL) 500 MG tablet Take 500 mg by mouth every 6 (six) hours as needed for pain. 1000 mg in the morning and at night  Additional 1000 mg daily at mid day if needed.     alendronate (FOSAMAX) 70 MG tablet Take 70 mg by mouth once a week. Take with a full glass of water on an empty stomach.     amLODipine (NORVASC) 5 MG tablet Take 5 mg by mouth daily.     atorvastatin (LIPITOR) 40 MG tablet Take 1 tablet (40 mg total) by mouth at bedtime. 90 tablet 0   clopidogrel (PLAVIX) 75 MG tablet Take 1 tablet (75 mg total) by mouth daily. 90 tablet 0   Cobalamin Combinations (NEURIVA PLUS) CAPS Take by mouth.     fenofibrate (TRICOR) 145 MG tablet Take 1 tablet by mouth daily, need  MD visit for further refills 90 tablet 0   fluticasone (FLONASE) 50 MCG/ACT nasal spray Place 2 sprays into the nose daily as needed. Allergic rhinitis.     magnesium oxide (MAG-OX) 400 MG tablet Take 400 mg by mouth daily.     Multiple Vitamin (MULTIVITAMIN WITH MINERALS) TABS Take 1 tablet by mouth daily.     solifenacin (VESICARE) 10 MG tablet Take 10 mg by mouth daily.     Turmeric 500 MG CAPS Take 1,000 mg by mouth.     UNABLE TO FIND GOLI Applecider Vinegar     valsartan-hydrochlorothiazide (DIOVAN-HCT) 320-12.5 MG tablet TK 1 T PO QD  11   No current facility-administered medications on file prior to visit.    Allergies:   Allergies  Allergen Reactions   Other Other (See Comments)    Anesthesia causes nausea and vomiting, memory difficulty and problems processing.   Oxycodone Nausea Only   Sulfa Antibiotics Rash   Sulfasalazine Rash    Physical Exam Today's Vitals   09/25/21 1344  BP: 132/60  Pulse: 61  SpO2: 98%  Weight: 176 lb (79.8 kg)  Height: 5\' 2"  (1.575 m)   Body mass index is 32.19 kg/m.  General: well developed, well nourished pleasant elderly Caucasian lady, seated, in no evident distress Head: head normocephalic and atraumatic.   Neck: supple with no carotid or supraclavicular  bruits Cardiovascular: regular rate and rhythm, no murmurs Musculoskeletal: no deformity Skin:  no rash/petichiae Vascular:  Normal pulses all extremities  Neurologic Exam Mental Status: Awake and fully alert.  Fluent speech and language.  Oriented to place and time. Recent memory subjectively impaired and remote memory intact. Attention span, concentration and fund of knowledge appropriate. Mood and affect appropriate.  Cranial Nerves: Pupils equal, briskly reactive to light. Extraocular movements full without nystagmus. Visual fields full to confrontation. Hearing intact. Facial sensation intact.  Face, tongue and palate moves normally and symmetrically.  Motor: Normal bulk and tone. Normal strength in all tested extremity muscles. Sensory.: intact to touch , pinprick , position and vibratory sensation.  Coordination: Rapid alternating movements normal in all extremities. Finger-to-nose and heel-to-shin performed accurately bilaterally. Gait and Station: Arises from chair without difficulty. Stance is normal. Gait demonstrates normal stride length and balance without use of assistive device. Able to heel, toe and tandem walk without difficulty.  Reflexes: 1+ and symmetric. Toes downgoing.       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     L subcortical stroke -Continue Plavix and atorvastatin 40 mg daily for secondary stroke prevention -maintain strict control of hypertension with blood pressure goal below 130/90 and lipids with LDL cholesterol goal below 70 mg/dL.  Routine lab work with PCP which has been satisfactory (unable to personally view via epic) -Cleared to pursue eyelid procedure as her stroke was 11 months ago and has been stable since that time - aware surgeon will need to send clearance paperwork to be  completed  Mild cognitive impairment -stable -EEG 11/2020 normal -dementia panel 11/2020 WNL -discussed memory compensation strategies and advised patient to increase participation in regular cognitively challenging activities like solving crossword puzzles, playing bridge and sodoku.      Follow-up in 6 months or call earlier if needed    CC:  Stoneking, Hal, MD   I spent 29 minutes of face-to-face and non-face-to-face time with patient.  This included previsit chart review, lab review, study review, electronic health record documentation, patient education and discussion regarding history of prior stroke, importance of managing stroke risk factors and secondary stroke prevention measures, mild cognitive impairment and education and answered all other questions to patient satisfaction  Ihor Austin, AGNP-BC  [facility] [address] [place], [place] [address]  Phone [phone] Fax [phone] Note: This document was prepared with digital dictation and possible smart phrase technology. Any transcriptional errors that result from this process are unintentional.

## 2021-09-25 NOTE — Patient Instructions (Signed)
Continue clopidogrel 75 mg daily  and atorvastatin  for secondary stroke prevention  Continue to follow up with PCP regarding cholesterol and blood pressure management  Maintain strict control of hypertension with blood pressure goal below 130/90 and cholesterol with LDL cholesterol (bad cholesterol) goal below 70 mg/dL.   Continue nightly CPAP use for sleep apnea management      Followup in the future with me in 6 months or call earlier if needed       Thank you for coming to see [place] at [facility]. I hope we have been able to provide you high quality care today.  You may receive a patient satisfaction survey over the next few weeks. We would appreciate your feedback and comments so that we may continue to improve ourselves and the health of our patients.

## 2021-10-04 DIAGNOSIS — N3941 Urge incontinence: Secondary | ICD-10-CM | POA: Diagnosis not present

## 2021-10-18 DIAGNOSIS — G4733 Obstructive sleep apnea (adult) (pediatric): Secondary | ICD-10-CM | POA: Diagnosis not present

## 2021-10-19 DIAGNOSIS — G4733 Obstructive sleep apnea (adult) (pediatric): Secondary | ICD-10-CM | POA: Diagnosis not present

## 2021-10-21 DIAGNOSIS — I129 Hypertensive chronic kidney disease with stage 1 through stage 4 chronic kidney disease, or unspecified chronic kidney disease: Secondary | ICD-10-CM | POA: Diagnosis not present

## 2021-10-21 DIAGNOSIS — L237 Allergic contact dermatitis due to plants, except food: Secondary | ICD-10-CM | POA: Diagnosis not present

## 2021-11-04 DIAGNOSIS — G4733 Obstructive sleep apnea (adult) (pediatric): Secondary | ICD-10-CM | POA: Diagnosis not present

## 2021-11-05 DIAGNOSIS — M47816 Spondylosis without myelopathy or radiculopathy, lumbar region: Secondary | ICD-10-CM | POA: Diagnosis not present

## 2021-11-05 DIAGNOSIS — M48061 Spinal stenosis, lumbar region without neurogenic claudication: Secondary | ICD-10-CM | POA: Diagnosis not present

## 2021-11-18 DIAGNOSIS — G4733 Obstructive sleep apnea (adult) (pediatric): Secondary | ICD-10-CM | POA: Diagnosis not present

## 2021-11-18 DIAGNOSIS — M47816 Spondylosis without myelopathy or radiculopathy, lumbar region: Secondary | ICD-10-CM | POA: Diagnosis not present

## 2021-11-22 DIAGNOSIS — Z1389 Encounter for screening for other disorder: Secondary | ICD-10-CM | POA: Diagnosis not present

## 2021-11-22 DIAGNOSIS — G4733 Obstructive sleep apnea (adult) (pediatric): Secondary | ICD-10-CM | POA: Diagnosis not present

## 2021-11-22 DIAGNOSIS — Z0000 Encounter for general adult medical examination without abnormal findings: Secondary | ICD-10-CM | POA: Diagnosis not present

## 2021-11-22 DIAGNOSIS — I129 Hypertensive chronic kidney disease with stage 1 through stage 4 chronic kidney disease, or unspecified chronic kidney disease: Secondary | ICD-10-CM | POA: Diagnosis not present

## 2021-11-22 DIAGNOSIS — Z23 Encounter for immunization: Secondary | ICD-10-CM | POA: Diagnosis not present

## 2021-11-22 DIAGNOSIS — Z79899 Other long term (current) drug therapy: Secondary | ICD-10-CM | POA: Diagnosis not present

## 2021-11-22 DIAGNOSIS — E785 Hyperlipidemia, unspecified: Secondary | ICD-10-CM | POA: Diagnosis not present

## 2021-12-03 DIAGNOSIS — M48061 Spinal stenosis, lumbar region without neurogenic claudication: Secondary | ICD-10-CM | POA: Diagnosis not present

## 2021-12-18 DIAGNOSIS — G4733 Obstructive sleep apnea (adult) (pediatric): Secondary | ICD-10-CM | POA: Diagnosis not present

## 2021-12-22 DIAGNOSIS — B338 Other specified viral diseases: Secondary | ICD-10-CM

## 2021-12-22 HISTORY — DX: Other specified viral diseases: B33.8

## 2021-12-25 DIAGNOSIS — Z03818 Encounter for observation for suspected exposure to other biological agents ruled out: Secondary | ICD-10-CM | POA: Diagnosis not present

## 2021-12-25 DIAGNOSIS — R051 Acute cough: Secondary | ICD-10-CM | POA: Diagnosis not present

## 2022-01-03 DIAGNOSIS — N3941 Urge incontinence: Secondary | ICD-10-CM | POA: Diagnosis not present

## 2022-01-16 DIAGNOSIS — G4733 Obstructive sleep apnea (adult) (pediatric): Secondary | ICD-10-CM | POA: Diagnosis not present

## 2022-02-04 DIAGNOSIS — M48061 Spinal stenosis, lumbar region without neurogenic claudication: Secondary | ICD-10-CM | POA: Diagnosis not present

## 2022-03-27 ENCOUNTER — Telehealth: Payer: Self-pay | Admitting: Adult Health

## 2022-03-27 ENCOUNTER — Ambulatory Visit: Payer: Medicare Other | Admitting: Adult Health

## 2022-03-27 ENCOUNTER — Encounter: Payer: Self-pay | Admitting: Adult Health

## 2022-03-27 VITALS — BP 141/59 | HR 72 | Ht 62.0 in | Wt 178.0 lb

## 2022-03-27 DIAGNOSIS — R471 Dysarthria and anarthria: Secondary | ICD-10-CM

## 2022-03-27 DIAGNOSIS — R29810 Facial weakness: Secondary | ICD-10-CM | POA: Diagnosis not present

## 2022-03-27 DIAGNOSIS — I639 Cerebral infarction, unspecified: Secondary | ICD-10-CM

## 2022-03-27 DIAGNOSIS — G3184 Mild cognitive impairment, so stated: Secondary | ICD-10-CM

## 2022-03-27 NOTE — Telephone Encounter (Signed)
Noted! Thank you

## 2022-03-27 NOTE — Progress Notes (Signed)
?[facility] ?[address] ?[place]. [place] [address] ?[phone] ? ?     STROKE FOLLOW UP NOTE ? ?Lisa [name], Dr. ?Date of Birth:  [birth year] ?Medical Record Number:  [id number]  ? ?Referring MD: Lisa [name] ? ?Reason for Referral:  Stroke ? ?Chief Complaint  ?Patient presents with  ? Follow-up  ?  RM 3 alone ?Pt is well, states she has noticed more drooping on R side of face and some slurried speech when speaking faster.   ?  ? ? ?HPI ? ?Update 03/27/2022 JM: Patient returns for 3-month stroke follow-up unaccompanied. Is concerned regarding worsening of right facial weakness and slurred speech more noticeable over the past month. She did have this after her prior stroke in 10/2020 but had resolved at prior visit 6 months ago.  Has noticed more difficulty holding liquids and food in her mouth. Denies any pain or numbness/tingling. Denies any leg or arm symptoms. Denies any swallowing issues. Does report bad case of RSV in Jan/Feb and was isolated and basically sat still for about 5 weeks. She is unsure if facial weakness and slurred speech was present during that time as she was not talking much and felt so ill. Reports about the last week or so she has really started getting back to her normal self and being able to tolerate more activity/exercise. Reports cognition has been stable since prior visit. She has been using Neuriva over the past year and believes this has improved memory some.  Compliant on Plavix and atorvastatin, denies side effects.  Blood pressure today 141/59. Does not routinely monitor at home. Continues to be routinely followed by PCP with lipid panel 11/2021 LDL 69.  Reports nightly use of CPAP monitored by PCP.  No further concerns at this time. ? ? ? ? ?History provided for reference purposes only ?Update 09/25/2021 JM:: Returns for 60-month stroke follow-up.  Overall doing well. C/o voice not as high pitched - noticed more so in the last few months. Planned to  have eye lid repair prior to her stroke - questions if she is able to pursue procedure now. Speech has greatly improved. Denies new stroke/TIA symptoms. Remains on plavix and atorvastatin - denies side effects. Blood pressure 132/60 - monitors at home- has been stable.  Routinely follows with PCP Dr. Felipa [name] with routine lab work which has been satisfactory (unable to view via epic).  Compliant on CPAP nightly.  No further concerns at this time. ? ?Update 03/20/2021 JM: [name] returns for 73-month stroke follow-up ? ?Stable since prior visit without new stroke/TIA symptoms ?Reports residual mild occasional slurred speech and right lower facial weakness ?Slurred speech typically present when speaking in longer sentences or she is thinking about other things while talking -she currently resides at [name] and plans on seeing if they have a speech therapist who she can further work with ? ?Compliant on Plavix and atorvastatin -denies associated side effects ?Blood pressure today 129/64 ?Reports recent lab work by PCP which is satisfactory ?Reports compliance nightly with CPAP for OSA management ? ?Short-term memory difficulties present prior to her stroke and denies worsening post stroke - EEG and dementia panel unremarkable.  Memory has been stable since prior visit ? ?No further concerns at this time ? ?Initial visit 11/21/2020 Lisa [name]: Lisa [name] is a pleasant 81 year old Caucasian lady seen today for initial office consultation visit for stroke.  History is obtained from the patient, review of electronic medical records and I personally reviewed  imaging films in PACS.  She has past medical history for hyperlipidemia, hypertension, obstructive sleep apnea, stage III chronic kidney disease who presented to [name] ER on 11/06/2020 when she noticed sudden onset of right facial droop and slurred speech upon awakening from sleep.  She presented beyond time window for TPA.  Symptoms quickly resolved after  arrival.  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  Urine drug screen was positive for ecstasy but patient declined use.  LDL cholesterol is 88 mg percent.  Hemoglobin A1c was 6.1.  2D echo showed ejection fraction of 55 to 60%.  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  MRA neck was unremarkable.  Patient was started on aspirin Plavix Lipitor.  She states she is tolerating them well with does have minor bruising.  Blood pressures well controlled in the 130s at home though it is elevated in the office today at 155/70.  She has had no recurrent stroke or TIA symptoms.  Her main complaint is her chronic back pain she has undergone spine surgery as well as multiple epidural injections her walking is difficult.  She has noticed some increased short-term memory difficulties and she often loses words and has word finding difficulties she had this even prior to the stroke but seems to be more pronounced.  She has no family [name] Alzheimer's. ? ? ? ? ? ?ROS:   ?14 system review of systems is positive for those listed in HPI and all other systems negative ? ? ?PMH:  ?Past Medical History:  ?Diagnosis Date  ? Acne rosacea   ? Allergic rhinitis   ? Chronic kidney disease   ? kidney stones, Nov. 2013- tx with Lithotripsy, still has some present   ? Cough variant asthma   ? Degenerative disc disease   ? knees, ankles, back- OA  ? GERD (gastroesophageal reflux disease)   ? Headache(784.0)   ? since stopping aleve- 07/30/2013  ? Hyperlipemia   ? dyslipidemia  ? Hypertension   ? [facility] follows pt. [name] ,has never taken anti-hypertensive  ? Osteoarthritis   ? Plantar fasciitis   ? (using orthotics-they are wearing out)- Dr [name]  ? Pneumonia   ? double pneumonia - relative to sinus infection , had chemical cauterization   ? PONV (postoperative nausea and vomiting)   ?  urgency, N&V for days, memory & processing  ? Sleep apnea   ? Bringing cpap mask and tubing, study last done- 2006  ? ? ?Social History:  ?Social History  ? ?Socioeconomic History  ? Marital status: Divorced  ?  Spouse name: Not on file  ? Number of children: Not on file  ? Years of education: Not on file  ? Highest education level: Not on file  ?Occupational History  ? Occupation: retired  ?Tobacco Use  ? Smoking status: Never  ? Smokeless tobacco: Never  ?Substance and Sexual Activity  ? Alcohol use: Yes  ?  Comment: on occassion  ? Drug use: No  ? Sexual activity: Not on file  ?Other Topics Concern  ? Not on file  ?Social History Narrative  ? Lives alone  ? Left Handed  ? Drinks 2-3 cups caffeine daily  ? ?Social Determinants of Health  ? ?Financial Resource Strain: Not on file  ?Food Insecurity: Not on file  ?Transportation Needs: Not on file  ?  Physical Activity: Not on file  ?Stress: Not on file  ?Social Connections: Not on file  ?Intimate Partner Violence: Not on file  ? ? ?Medications:   ?Current Outpatient Medications on File Prior to Visit  ?Medication Sig Dispense Refill  ? acetaminophen (TYLENOL) 500 MG tablet Take 500 mg by mouth every 6 (six) hours as needed for pain. 1000 mg in the morning and at night  ?Additional 1000 mg daily at mid day if needed.    ? alendronate (FOSAMAX) 70 MG tablet Take 70 mg by mouth once a week. Take with a full glass of water on an empty stomach.    ? amLODipine (NORVASC) 5 MG tablet Take 5 mg by mouth daily.    ? atorvastatin (LIPITOR) 40 MG tablet Take 1 tablet (40 mg total) by mouth at bedtime. 90 tablet 0  ? clopidogrel (PLAVIX) 75 MG tablet Take 1 tablet (75 mg total) by mouth daily. 90 tablet 0  ? Cobalamin Combinations (NEURIVA PLUS) CAPS Take by mouth.    ? fenofibrate (TRICOR) 145 MG tablet Take 1 tablet by mouth daily, need MD visit for further refills 90 tablet 0  ? Ferrous Sulfate (IRON PO) Take 60 mg by mouth 2 (two) times daily.    ? fluticasone (FLONASE) 50  MCG/ACT nasal spray Place 2 sprays into the nose daily as needed. Allergic rhinitis.    ? magnesium oxide (MAG-OX) 400 MG tablet Take 400 mg by mouth daily.    ? Multiple Vitamin (MULTIVITAMIN WITH MINERAL

## 2022-03-27 NOTE — Telephone Encounter (Signed)
[employer] medicare order sent to [facility], [facility] they will reach out to the patient to schedule.  

## 2022-03-27 NOTE — Telephone Encounter (Signed)
Pt has called back to inform Shanda Bumps, NP that  [facility] in [employer] has been suggested to her for her speech therapy ?

## 2022-03-27 NOTE — Patient Instructions (Addendum)
Would recommend working with speech therapy for speech, facial weakness and cognition - please let me know where you would like this referral sent to  ? ?You will be called to complete an MRI of your brain to ensure no new stroke  ? ?Continue clopidogrel 75 mg daily  and atorvastatin for secondary stroke prevention ? ?Continue nightly use of CPAP for sleep apnea management ? ?Continue to follow up with PCP regarding cholesterol and blood pressure management  ?Maintain strict control of hypertension with blood pressure goal below 130/90 and cholesterol with LDL cholesterol (bad cholesterol) goal below 70 mg/dL.  ? ?Signs of a Stroke? Follow the BEFAST method:  ?Balance Watch for a sudden loss of balance, trouble with coordination or vertigo ?Eyes Is there a sudden loss of vision in one or both eyes? Or double vision?  ?Face: Ask the person to smile. Does one side of the face droop or is it numb?  ?Arms: Ask the person to raise both arms. Does one arm drift downward? Is there weakness or numbness of a leg? ?Speech: Ask the person to repeat a simple phrase. Does the speech sound slurred/strange? Is the person confused ? ?Time: If you observe any of these signs, call 911. ? ? ? ? ? ? ? ?Thank you for coming to see [place] at [facility]. I hope we have been able to provide you high quality care today. ? ?You may receive a patient satisfaction survey over the next few weeks. We would appreciate your feedback and comments so that we may continue to improve ourselves and the health of our patients. ? ?

## 2022-04-14 ENCOUNTER — Ambulatory Visit
Admission: RE | Admit: 2022-04-14 | Discharge: 2022-04-14 | Disposition: A | Discharge status: Home / Self Care | Payer: Medicare Other | Source: Ambulatory Visit | Attending: Adult Health | Admitting: Adult Health

## 2022-04-14 DIAGNOSIS — R471 Dysarthria and anarthria: Secondary | ICD-10-CM | POA: Diagnosis not present

## 2022-04-14 DIAGNOSIS — R29810 Facial weakness: Secondary | ICD-10-CM | POA: Diagnosis not present

## 2022-04-28 ENCOUNTER — Encounter: Payer: Medicare Other | Admitting: Speech Pathology

## 2022-04-29 ENCOUNTER — Ambulatory Visit: Payer: Medicare Other | Attending: Adult Health | Admitting: Speech Pathology

## 2022-04-29 DIAGNOSIS — R471 Dysarthria and anarthria: Secondary | ICD-10-CM | POA: Diagnosis present

## 2022-04-30 NOTE — Therapy (Signed)
[name] ?[place], [name], [address] ?Phone: [phone]   Fax:  [phone] ? ?Speech Language Pathology Evaluation ? ?Patient Details  ?Name: Lisa [name], Dr. ?MRN: [id number] ?Date of Birth: 25-Nov-1941 ?Referring Provider (SLP): Frann Rider ? ? ?Encounter Date: 04/29/2022 ? ? End of Session - 04/30/22 2004   ? ? Visit Number 1   ? Number of Visits 17   ? Date for SLP Re-Evaluation 06/25/22   ? Authorization Type [employer]   ? Authorization Time Period 04/29/2022 thru 06/25/2022   ? Authorization - Visit Number 1   ? Progress Note Due on Visit 10   ? SLP Start Time 1300   ? SLP Stop Time  1400   ? SLP Time Calculation (min) 60 min   ? Activity Tolerance Patient tolerated treatment well   ? ?  ?  ? ?  ? ? ?Past Medical History:  ?Diagnosis Date  ? Acne rosacea   ? Allergic rhinitis   ? Chronic kidney disease   ? kidney stones, Nov. 2013- tx with Lithotripsy, still has some present   ? Cough variant asthma   ? Degenerative disc disease   ? knees, ankles, back- OA  ? GERD (gastroesophageal reflux disease)   ? Headache(784.0)   ? since stopping aleve- 07/30/2013  ? Hyperlipemia   ? dyslipidemia  ? Hypertension   ? [facility] pharmacist follows pt. [name] ,has never taken anti-hypertensive  ? Osteoarthritis   ? Plantar fasciitis   ? (using orthotics-they are wearing out)- Dr Carrolyn Leigh  ? Pneumonia   ? double pneumonia - relative to sinus infection , had chemical cauterization   ? PONV (postoperative nausea and vomiting)   ? urgency, N&V for days, memory & processing  ? Sleep apnea   ? Bringing cpap mask and tubing, study last done- 2006  ? ? ?Past Surgical History:  ?Procedure Laterality Date  ? ABDOMINAL HYSTERECTOMY  1990's  ? BLADDER REPAIR    ? BLADDER SUSPENSION  90's  ? MAXIMUM ACCESS (MAS)POSTERIOR LUMBAR INTERBODY FUSION (PLIF) 2 LEVEL N/A 08/12/2013  ? Procedure: FOR MAXIMUM ACCESS (MAS) POSTERIOR LUMBAR  INTERBODY FUSION (PLIF) 2 LEVEL;  Surgeon: Eustace Moore, MD;  Location: [facility];  Service: Neurosurgery;  Laterality: N/A;  FOR MAXIMUM ACCESS (MAS) POSTERIOR LUMBAR INTERBODY FUSION (PLIF) 2 LEVEL  ? MULTIPLE TOOTH EXTRACTIONS    ? wisdom teeth extractions   ? POSTERIOR FUSION LUMBAR SPINE  08/12/2013  ? Dr Ronnald Ramp  ? TONSILLECTOMY    ? ? ?There were no vitals filed for this visit. ? ? Subjective Assessment - 04/30/22 1959   ? ? Subjective pt pleasant, eager, good historian   ? ?  ?  ? ?  ? ? ? ? ? SLP Evaluation [facility] - 04/30/22 0001   ? ?  ? SLP Visit Information  ? SLP Received On 04/29/22   ? Referring Provider (SLP) Frann Rider   ? Onset Date 03/27/2022   ? Medical Diagnosis Ischemic Stroke   ?  ? General Information  ? HPI Pt is a 81 year old female who was referred by her neurologist d/t worsening right facial weakness for past month. Given pt's hx of CVA, repeat MRI performed that revealed no acute findings, expected evolution of the left corona radiata stroke that had been acute on the 11/06/2020 MRI.  Additionally, there is a remote lacunar infarction in the left frontal  lobe, unchanged compared to the previous MRI. Additional T2/FLAIR hyperintense foci in the hemispheres and pons consistent with mild chronic microvascular ischemic change, unchanged compared to the 2021 MRI. In light of the negative MRI, suspect deficits were exacerbated by prolonged severe case of RSV.   ? Behavioral/Cognition appropriate, good historian   ? Mobility Status ambulatory   ?  ? Balance Screen  ? Has the patient fallen in the past 6 months No   ? Has the patient had a decrease in activity level because of a fear of falling?  No   ? Is the patient reluctant to leave their home because of a fear of falling?  No   ?  ? Oral Motor/Sensory Function  ? Overall Oral Motor/Sensory Function Impaired   ? Labial ROM Reduced right   ? Labial Symmetry Abnormal symmetry right   ? Labial Strength Reduced   ? Labial Sensation Reduced  Right   ? Labial Coordination Reduced   ? Lingual ROM Reduced right   ? Lingual Symmetry Within Functional Limits   ? Lingual Strength Within Functional Limits   ? Lingual Sensation Within Functional Limits   ? Lingual Coordination WFL   ? Facial ROM Reduced right   ? Facial Symmetry Right droop   ? Facial Strength Reduced   ? Facial Sensation Reduced   ? Facial Coordination Reduced   ? Velum Within Functional Limits   ? Mandible Within Functional Limits   ?  ? Motor Speech  ? Overall Motor Speech Impaired   ? Respiration Within functional limits   ? Phonation Low vocal intensity   ? Resonance Within functional limits   ? Articulation Impaired   ? Level of Impairment Conversation   ? Intelligibility Intelligibility reduced   ? Word 75-100% accurate   ? Phrase 75-100% accurate   ? Sentence 75-100% accurate   ? Motor Planning Within functional limits   ? Motor Speech Errors Not applicable   ? ?  ?  ? ?  ? ? ? ? ? ? ? ? ? ? ? ? ? ? ? ? ? ? SLP Education - 04/30/22 2004   ? ? Education Details results of this assessment and ST POC   ? Person(s) Educated Patient   ? Methods Explanation;Demonstration;Verbal cues;Handout   ? Comprehension Verbalized understanding;Returned demonstration   ? ?  ?  ? ?  ? ? ? SLP Short Term Goals - 04/30/22 2006   ? ?  ? SLP SHORT TERM GOAL #1  ? Title Pt will complete HEP in 5 out of 7 days.   ? Baseline new goal   ? Time 10   ? Period --   sessions  ? Status New   ? ?  ?  ? ?  ? ? ? SLP Long Term Goals - 04/30/22 2008   ? ?  ? SLP LONG TERM GOAL #1  ? Title Pt will improve speech intelligibility for conversational speech by controlling rate of speech, over-articulation, and increased loudness to achieve > 95% intelligibility.   ? Baseline new goal   ? Time 8   ? Period Weeks   ? Status New   ? Target Date 06/25/22   ? ?  ?  ? ?  ? ? ? Plan - 04/30/22 2005   ? ? Clinical Impression Statement Pt presents with mild right facial weakness and intermittent dysarthria. Pt's right facial weakness  is c/b decreased range of movement when smiling and decreased sensation.  She reports some anterior labial spillage when eating, drinking as well as occasional drooling. In addition, pt presents with mild dysarthria c/b low vocal intensity and imprecise articulation. It is more difficult to talk on the telephone as a result. SLP introduced oral motor exercises in an effort to impact pt's oral deficits and improve speech intelligibility. Skilled ST is required to target the above mentioned impairments to improve quality of life, resume leisure activities.   ? Speech Therapy Frequency 2x / week   ? Duration 8 weeks   ? Treatment/Interventions SLP instruction and feedback;Patient/family education;Oral motor exercises   ? Potential to Achieve Goals Good   ? SLP Home Exercise Plan provided, see pt instructions section   ? ?  ?  ? ?  ? ? ?Patient will benefit from skilled therapeutic intervention in order to improve the following deficits and impairments:   ?Dysarthria and anarthria ? ? ? ?Problem List ?Patient Active Problem List  ? Diagnosis Date Noted  ? Acute CVA (cerebrovascular accident) ([place]) 11/06/2020  ? Chronic kidney disease   ? Hypertension   ? Hyperlipemia   ? Ingrown toenail 09/26/2018  ? Acute non-recurrent maxillary sinusitis 01/05/2018  ? Spinal stenosis of lumbar region 09/20/2013  ? Pure hypercholesterolemia 09/20/2013  ? Allergic rhinitis, cause unspecified 09/20/2013  ? Encounter for long-term (current) use of other medications 09/20/2013  ? ?Aurelio Mccamy B. Rutherford Nail, M.S., CCC-SLP, CBIS ?Speech-Language Pathologist ?Rehabilitation Services ?Office [phone] ? ?[place], CCC-SLP ?04/30/2022, 8:10 PM ? ?[place] [facility] ?[place], [name], [address] ?Phone: [phone]   Fax:  [phone] ? ?Name: Lisa [name], Dr. ?MRN: [id number] ?Date of Birth: June 17, 1941 ?

## 2022-04-30 NOTE — Patient Instructions (Signed)
Complete oral motor exercises, practice reading out loud while recording herself for audiofeedback  ?

## 2022-05-05 ENCOUNTER — Ambulatory Visit: Payer: Medicare Other | Admitting: Speech Pathology

## 2022-05-05 DIAGNOSIS — R471 Dysarthria and anarthria: Secondary | ICD-10-CM

## 2022-05-06 NOTE — Patient Instructions (Signed)
Practice reading out loud, complete oral motor exercises ?

## 2022-05-06 NOTE — Therapy (Signed)
[place] ?[facility] ?[address] ?[place], [place], [address] ?Phone: [phone]   Fax:  [phone] ? ?Speech Language Pathology Treatment ?DISCHARGE SUMMARY ? ?Patient Details  ?Name: [name], Dr. ?MRN: [id number] ?Date of Birth: 1941-07-04 ?Referring Provider (SLP): Ihor Austin ? ? ?Encounter Date: 05/05/2022 ? ? End of Session - 05/06/22 1252   ? ? Visit Number 2   ? Number of Visits 2   ? Authorization Type [employer]   ? Authorization Time Period 04/29/2022 thru 06/25/2022   ? Authorization - Visit Number 2   ? SLP Start Time 1000   ? SLP Stop Time  1030   ? SLP Time Calculation (min) 30 min   ? Activity Tolerance Patient tolerated treatment well   ? ?  ?  ? ?  ? ? ?Past Medical History:  ?Diagnosis Date  ? Acne rosacea   ? Allergic rhinitis   ? Chronic kidney disease   ? kidney stones, Nov. 2013- tx with Lithotripsy, still has some present   ? Cough variant asthma   ? Degenerative disc disease   ? knees, ankles, back- OA  ? GERD (gastroesophageal reflux disease)   ? Headache(784.0)   ? since stopping aleve- 07/30/2013  ? Hyperlipemia   ? dyslipidemia  ? Hypertension   ? [facility] pharmacist follows pt. Riki Rusk Smart ,has never taken anti-hypertensive  ? Osteoarthritis   ? Plantar fasciitis   ? (using orthotics-they are wearing out)- Dr Luther Bradley  ? Pneumonia   ? double pneumonia - relative to sinus infection , had chemical cauterization   ? PONV (postoperative nausea and vomiting)   ? urgency, N&V for days, memory & processing  ? Sleep apnea   ? Bringing cpap mask and tubing, study last done- 2006  ? ? ?Past Surgical History:  ?Procedure Laterality Date  ? ABDOMINAL HYSTERECTOMY  1990's  ? BLADDER REPAIR    ? BLADDER SUSPENSION  90's  ? MAXIMUM ACCESS (MAS)POSTERIOR LUMBAR INTERBODY FUSION (PLIF) 2 LEVEL N/A 08/12/2013  ? Procedure: FOR MAXIMUM ACCESS (MAS) POSTERIOR LUMBAR INTERBODY FUSION (PLIF) 2 LEVEL;  Surgeon: Tia Alert,  MD;  Location: [facility];  Service: Neurosurgery;  Laterality: N/A;  FOR MAXIMUM ACCESS (MAS) POSTERIOR LUMBAR INTERBODY FUSION (PLIF) 2 LEVEL  ? MULTIPLE TOOTH EXTRACTIONS    ? wisdom teeth extractions   ? POSTERIOR FUSION LUMBAR SPINE  08/12/2013  ? Dr Yetta Barre  ? TONSILLECTOMY    ? ? ?There were no vitals filed for this visit. ? ? Subjective Assessment - 05/06/22 1246   ? ? Subjective pt pleasant, eager, good historian   ? Currently in Pain? No/denies   ? ?  ?  ? ?  ? ? ? ? ? ? ? ? ADULT SLP TREATMENT - 05/06/22 0001   ? ?  ? Treatment Provided  ? Treatment provided Cognitive-Linquistic   ?  ? Cognitive-Linquistic Treatment  ? Treatment focused on Dysarthria;Patient/family/caregiver education   ? Skilled Treatment Skilled treatment session targeted pt's dysarthria goals. SLP facilitated by providing education on speech intelligibility strategies to use during fatigue. Instructed pt to increase vocal intensity to immediately increase speech intelligibility. The benefits of doing so also result in slower rate and improved articulation. Pt also Mod I with oral motor exercises.   ? ?  ?  ? ?  ? ? ? SLP Education - 05/06/22 1251   ? ? Education Details may need to revisit oral  motor exercises in the future if weaknesses re-emerge   ? Person(s) Educated Patient   ? Methods Explanation;Demonstration;Verbal cues   ? Comprehension Verbalized understanding;Returned demonstration   ? ?  ?  ? ?  ? ? ? SLP Short Term Goals - 05/06/22 1255   ? ?  ? SLP SHORT TERM GOAL #1  ? Title Pt will complete HEP in 5 out of 7 days.   ? Status Achieved   ? ?  ?  ? ?  ? ? ? SLP Long Term Goals - 05/06/22 1255   ? ?  ? SLP LONG TERM GOAL #1  ? Title Pt will improve speech intelligibility for conversational speech by controlling rate of speech, over-articulation, and increased loudness to achieve > 95% intelligibility.   ? Status Achieved   ? ?  ?  ? ?  ? ? ? Plan - 05/06/22 1254   ? ? Clinical Impression Statement Pt continues to demonstrate  understanding of speech intelligibility strategies, oral motor exercises and she doesn't have any further questions at this time. Pt's speech intelligibility has improved from time of referral and is now > 95% at the abstract conversation level. No further skilled ST intervention is required at this time.   ? SLP Home Exercise Plan provided, see pt instructions section   ? Consulted and Agree with Plan of Care Patient   ? ?  ?  ? ?  ? ? ? ?Problem List ?Patient Active Problem List  ? Diagnosis Date Noted  ? Acute CVA (cerebrovascular accident) ([facility]) 11/06/2020  ? Chronic kidney disease   ? Hypertension   ? Hyperlipemia   ? Ingrown toenail 09/26/2018  ? Acute non-recurrent maxillary sinusitis 01/05/2018  ? Spinal stenosis of lumbar region 09/20/2013  ? Pure hypercholesterolemia 09/20/2013  ? Allergic rhinitis, cause unspecified 09/20/2013  ? Encounter for long-term (current) use of other medications 09/20/2013  ? ?Sheyli Horwitz B. Dreama Saa, M.S., CCC-SLP, CBIS ?Speech-Language Pathologist ?Rehabilitation Services ?Office [phone] ? ?Jordi Lacko, CCC-SLP ?05/06/2022, 12:55 PM ? ? ?[facility] ?[address] ?[place], [place], [address] ?Phone: [phone]   Fax:  [phone] ? ? ?Name: [name], Dr. ?MRN: [id number] ?Date of Birth: May 17, 1941 ? ?

## 2022-05-08 ENCOUNTER — Encounter: Payer: Medicare Other | Admitting: Speech Pathology

## 2022-05-12 ENCOUNTER — Other Ambulatory Visit: Payer: Self-pay | Admitting: Internal Medicine

## 2022-05-12 ENCOUNTER — Ambulatory Visit
Admission: RE | Admit: 2022-05-12 | Discharge: 2022-05-12 | Disposition: A | Discharge status: Home / Self Care | Payer: Medicare Other | Source: Ambulatory Visit | Attending: Internal Medicine | Admitting: Internal Medicine

## 2022-05-12 ENCOUNTER — Encounter: Payer: Medicare Other | Admitting: Speech Pathology

## 2022-05-12 DIAGNOSIS — R051 Acute cough: Secondary | ICD-10-CM

## 2022-05-15 ENCOUNTER — Encounter: Payer: Medicare Other | Admitting: Speech Pathology

## 2022-05-20 ENCOUNTER — Encounter: Payer: Medicare Other | Admitting: Speech Pathology

## 2022-05-22 ENCOUNTER — Encounter: Payer: Medicare Other | Admitting: Speech Pathology

## 2022-05-27 ENCOUNTER — Encounter: Payer: Medicare Other | Admitting: Speech Pathology

## 2022-05-29 ENCOUNTER — Encounter: Payer: Medicare Other | Admitting: Speech Pathology

## 2022-06-03 ENCOUNTER — Encounter: Payer: Medicare Other | Admitting: Speech Pathology

## 2022-06-05 ENCOUNTER — Encounter: Payer: Medicare Other | Admitting: Speech Pathology

## 2022-06-09 ENCOUNTER — Other Ambulatory Visit: Payer: Self-pay | Admitting: Internal Medicine

## 2022-06-09 ENCOUNTER — Ambulatory Visit
Admission: RE | Admit: 2022-06-09 | Discharge: 2022-06-09 | Disposition: A | Discharge status: Home / Self Care | Payer: Medicare Other | Source: Ambulatory Visit | Attending: Internal Medicine | Admitting: Internal Medicine

## 2022-06-09 DIAGNOSIS — R053 Chronic cough: Secondary | ICD-10-CM

## 2022-06-09 IMAGING — DX DG CHEST 2V
2 series · 2 of 2 positions shown · non-contrast
Comparison: [DATE]

CLINICAL DATA: Persistent cough

EXAM:
CHEST - 2 VIEW

[dg chest 2 view (1 of 2)]
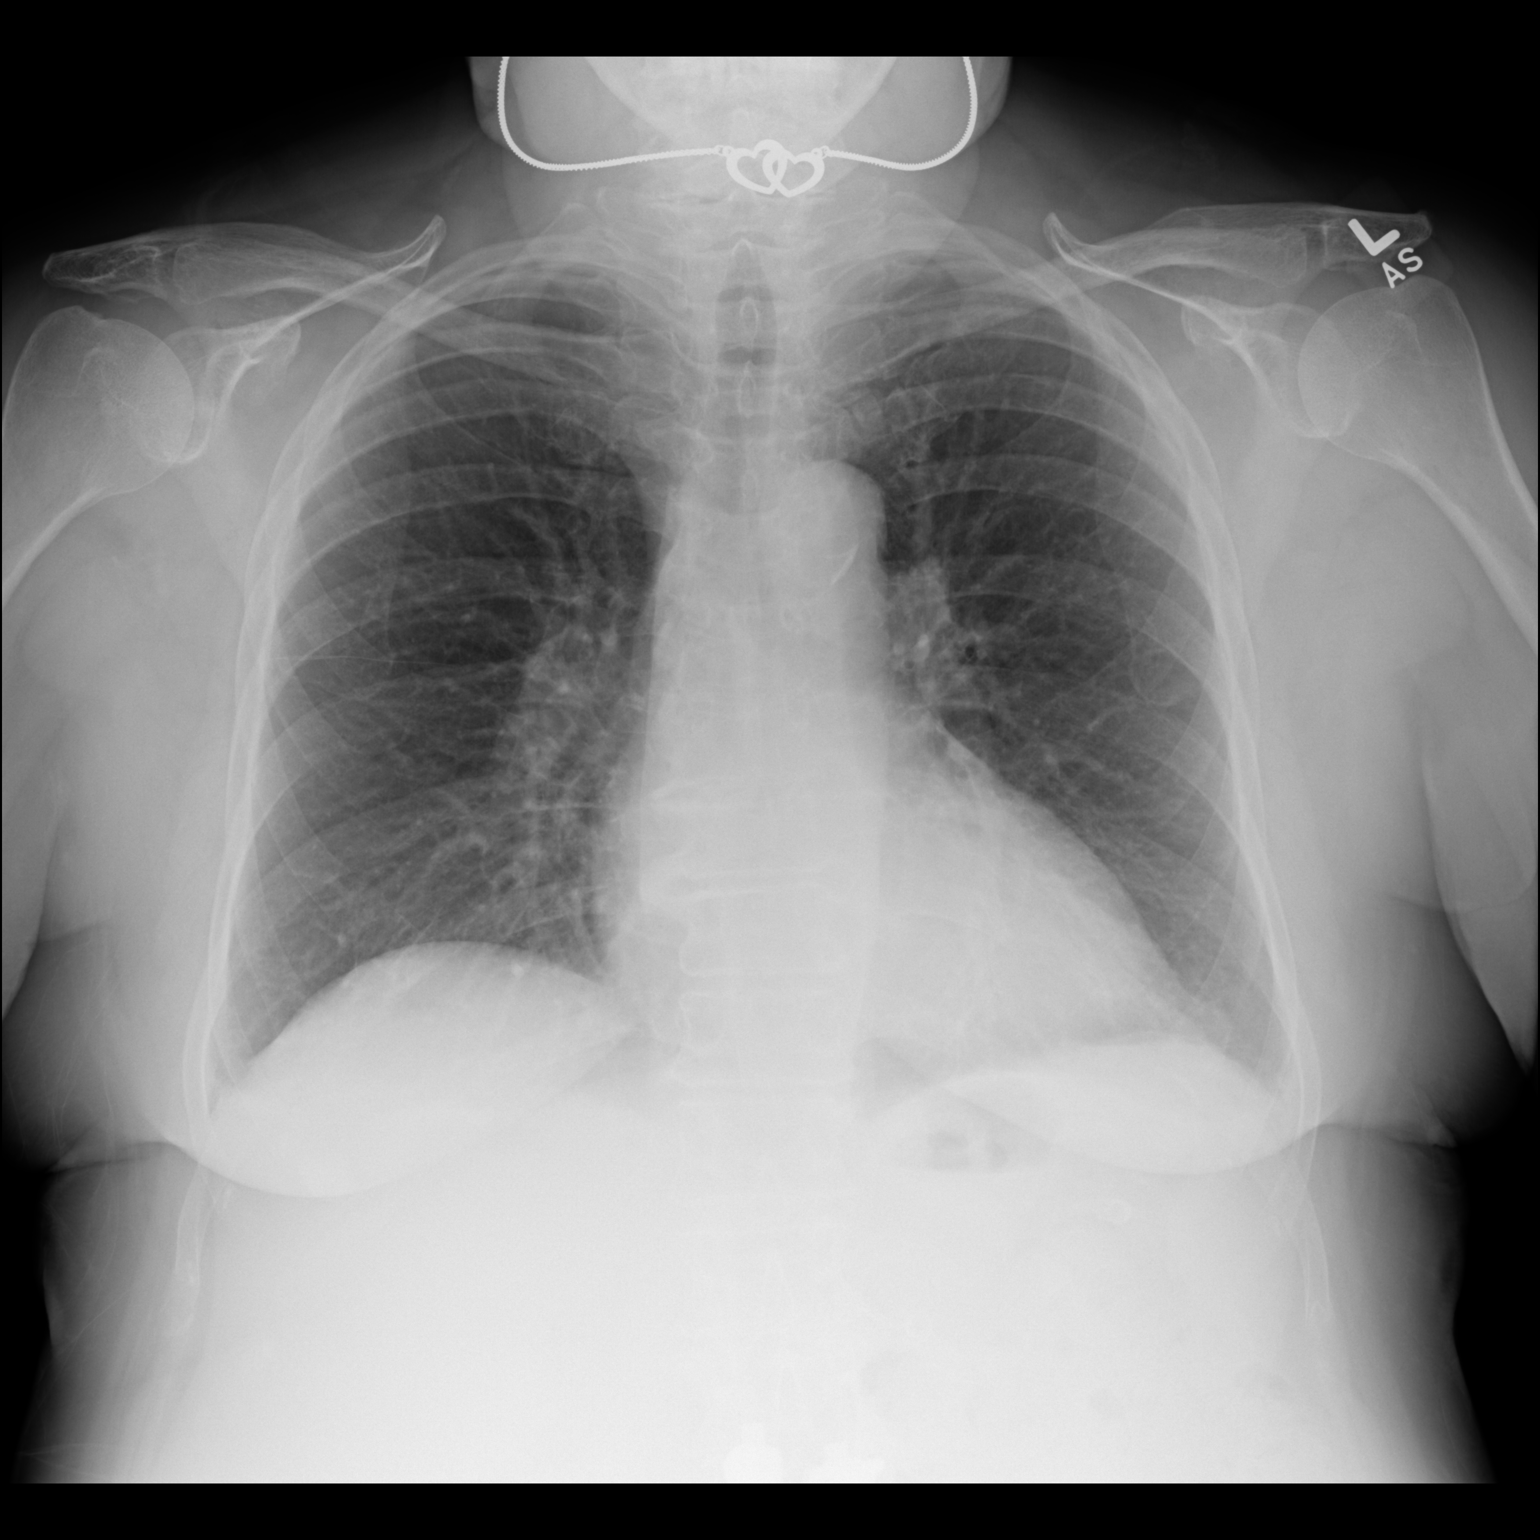

[dg chest 2 view (2 of 2)]
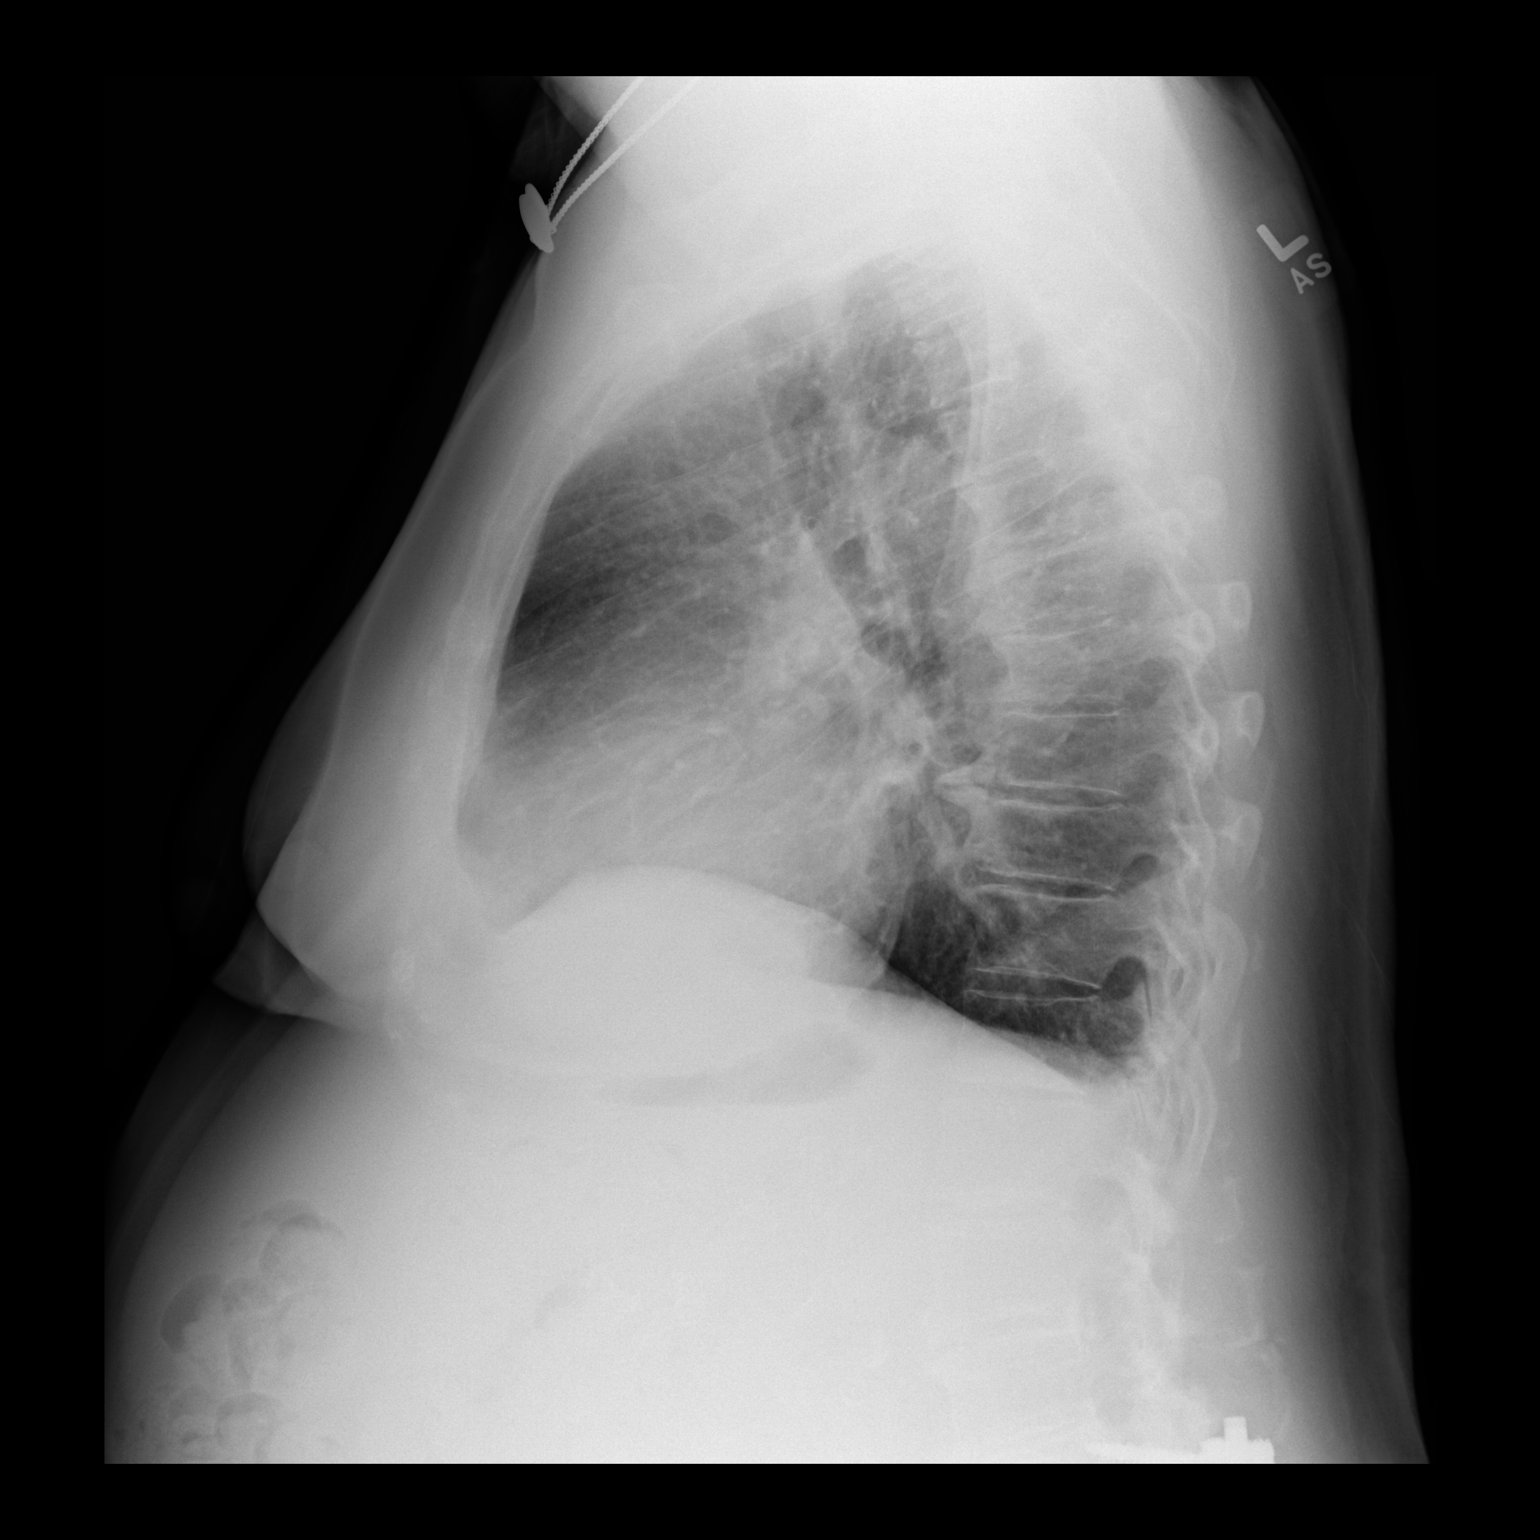

[2 of 2 positions shown; findings below may reference images not displayed]

FINDINGS: The heart size and mediastinal contours are within normal limits.
Both lungs are clear. The visualized skeletal structures are
unremarkable.
IMPRESSION: No active cardiopulmonary disease.

## 2022-06-10 ENCOUNTER — Encounter: Payer: Medicare Other | Admitting: Speech Pathology

## 2022-06-12 ENCOUNTER — Encounter: Payer: Medicare Other | Admitting: Speech Pathology

## 2022-06-17 ENCOUNTER — Encounter: Payer: Medicare Other | Admitting: Speech Pathology

## 2022-06-19 ENCOUNTER — Encounter: Payer: Medicare Other | Admitting: Speech Pathology

## 2022-06-26 ENCOUNTER — Ambulatory Visit: Payer: Medicare Other | Admitting: Adult Health

## 2022-07-07 ENCOUNTER — Encounter: Payer: Self-pay | Admitting: Adult Health

## 2022-07-07 ENCOUNTER — Ambulatory Visit: Payer: Medicare Other | Admitting: Adult Health

## 2022-07-07 VITALS — BP 134/59 | HR 67 | Ht 63.0 in | Wt 176.2 lb

## 2022-07-07 DIAGNOSIS — I639 Cerebral infarction, unspecified: Secondary | ICD-10-CM

## 2022-07-07 DIAGNOSIS — G3184 Mild cognitive impairment, so stated: Secondary | ICD-10-CM | POA: Diagnosis not present

## 2022-07-07 NOTE — Patient Instructions (Signed)
No changes today -continue current treatment plan  Please continue to follow closely with your PCP Dr. Pete Glatter for aggressive stroke risk factor management/monitoring  Continue nightly use of CPAP

## 2022-07-07 NOTE — Progress Notes (Signed)
[facility] [address] [place]. [place] [address] [phone]       STROKE FOLLOW UP NOTE  Ms. Caffie Pinto, Dr. Date of Birth:  02-25-1941 Medical Record Number:  [id number]   Referring MD: York Ram  Reason for Referral:  Stroke   Chief Complaint  Patient presents with   Follow-up    Pt reports she has been fine since her last stroke. No questions or concerns today. Room 2 alone       HPI  Update 07/07/2022 JM: Patient returns for 80-month follow-up visit accompanied.  Prior concerns of worsening right facial weakness and slurred speech greatly improved shortly after prior visit, repeat MR brain without any new or acute findings.  Was seen by SLP and released after 2 sessions.  Denies any new or reoccurring stroke/TIA symptoms.  Cognition has been stable since prior visit.  Compliant on Plavix and atorvastatin, denies side effects.  Blood pressure today 134/59.  Routinely follows with PCP Dr. Felipa Eth.  Reports nightly use of CPAP.  Greatest concern today is in regards to continued respiratory concerns with DOE, coughing and decreased energy levels which have been persistent since May, reports repeat x-rays without concerning findings and 4 courses of abx without improvement. She questions need of seeing specialist, plans on reaching out to her PCP to further discuss. No further or new neurological concerns at this time.       History provided for reference purposes only Update 03/27/2022 JM: Patient returns for 80-month stroke follow-up unaccompanied. Is concerned regarding worsening of right facial weakness and slurred speech more noticeable over the past month. She did have this after her prior stroke in 10/2020 but had resolved at prior visit 6 months ago.  Has noticed more difficulty holding liquids and food in her mouth. Denies any pain or numbness/tingling. Denies any leg or arm symptoms. Denies any swallowing issues. Does report bad case of RSV in  Jan/Feb and was isolated and basically sat still for about 5 weeks. She is unsure if facial weakness and slurred speech was present during that time as she was not talking much and felt so ill. Reports about the last week or so she has really started getting back to her normal self and being able to tolerate more activity/exercise. Reports cognition has been stable since prior visit. She has been using Neuriva over the past year and believes this has improved memory some.  Compliant on Plavix and atorvastatin, denies side effects.  Blood pressure today 141/59. Does not routinely monitor at home. Continues to be routinely followed by PCP with lipid panel 11/2021 LDL 69.  Reports nightly use of CPAP monitored by PCP.  No further concerns at this time.  Update 09/25/2021 JM:: Returns for 80-month stroke follow-up.  Overall doing well. C/o voice not as high pitched - noticed more so in the last few months. Planned to have eye lid repair prior to her stroke - questions if she is able to pursue procedure now. Speech has greatly improved. Denies new stroke/TIA symptoms. Remains on plavix and atorvastatin - denies side effects. Blood pressure 132/60 - monitors at home- has been stable.  Routinely follows with PCP Dr. Felipa Eth with routine lab work which has been satisfactory (unable to view via epic).  Compliant on CPAP nightly.  No further concerns at this time.  Update 03/20/2021 JM: Ms. Mcparland returns for 80-month stroke follow-up  Stable since prior visit without new stroke/TIA symptoms Reports residual mild occasional slurred speech and  right lower facial weakness Slurred speech typically present when speaking in longer sentences or she is thinking about other things while talking -she currently resides at [facility] and plans on seeing if they have a speech therapist who she can further work with  Compliant on Plavix and atorvastatin -denies associated side effects Blood pressure today 129/64 Reports recent  lab work by PCP which is satisfactory Reports compliance nightly with CPAP for OSA management  Short-term memory difficulties present prior to her stroke and denies worsening post stroke - EEG and dementia panel unremarkable.  Memory has been stable since prior visit  No further concerns at this time  Initial visit 11/21/2020 Dr. Leonie Man: Ms. Gau is a pleasant 81 year old Caucasian lady seen today for initial office consultation visit for stroke.  History is obtained from the patient, review of electronic medical records and I personally reviewed imaging films in PACS.  She has past medical history for hyperlipidemia, hypertension, obstructive sleep apnea, stage III chronic kidney disease who [facility] on 11/06/2020 when she noticed sudden onset of right facial droop and slurred speech upon awakening from sleep.  She presented beyond time window for TPA.  Symptoms quickly resolved after arrival.  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  Urine drug screen was positive for ecstasy but patient declined use.  LDL cholesterol is 88 mg percent.  Hemoglobin A1c was 6.1.  2D echo showed ejection fraction of 55 to 60%.  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  MRA neck was unremarkable.  Patient was started on aspirin Plavix Lipitor.  She states she is tolerating them well with does have minor bruising.  Blood pressures well controlled in the 130s at home though it is elevated in the office today at 155/70.  She has had no recurrent stroke or TIA symptoms.  Her main complaint is her chronic back pain she has undergone spine surgery as well as multiple epidural injections her walking is difficult.  She has noticed some increased short-term memory difficulties and she often loses words and has word finding difficulties she had this even prior to the stroke but seems  to be more pronounced.  She has no family Struve Alzheimer's.      ROS:   14 system review of systems is positive for those listed in HPI and all other systems negative   PMH:  Past Medical History:  Diagnosis Date   Acne rosacea    Allergic rhinitis    Chronic kidney disease    kidney stones, Nov. 2013- tx with Lithotripsy, still has some present    Cough variant asthma    Degenerative disc disease    knees, ankles, back- OA   GERD (gastroesophageal reflux disease)    Headache(784.0)    since stopping aleve- 07/30/2013   Hyperlipemia    dyslipidemia   Hypertension    [facility] pharmacist follows pt. [name] ,has never taken anti-hypertensive   Osteoarthritis    Plantar fasciitis    (using orthotics-they are wearing out)- Dr Carrolyn Leigh   Pneumonia    double pneumonia - relative to sinus infection , had chemical cauterization    PONV (postoperative nausea and vomiting)    urgency, N&V for days, memory & processing   Sleep apnea    Bringing cpap mask and tubing, study last done- 2006  Social History:  Social History   Socioeconomic History   Marital status: Divorced    Spouse name: Not on file   Number of children: Not on file   Years of education: Not on file   Highest education level: Not on file  Occupational History   Occupation: retired  Tobacco Use   Smoking status: Never   Smokeless tobacco: Never  Substance and Sexual Activity   Alcohol use: Yes    Comment: on occassion   Drug use: No   Sexual activity: Not on file  Other Topics Concern   Not on file  Social History Narrative   Lives alone   Left Handed   Drinks 2-3 cups caffeine daily   Social Determinants of Health   Financial Resource Strain: Not on file  Food Insecurity: Not on file  Transportation Needs: Not on file  Physical Activity: Not on file  Stress: Not on file  Social Connections: Not on file  Intimate Partner Violence: Not on file    Medications:   Current  Outpatient Medications on File Prior to Visit  Medication Sig Dispense Refill   acetaminophen (TYLENOL) 500 MG tablet Take 500 mg by mouth every 6 (six) hours as needed for pain. 1000 mg in the morning and at night  Additional 1000 mg daily at mid day if needed.     alendronate (FOSAMAX) 70 MG tablet Take 70 mg by mouth once a week. Take with a full glass of water on an empty stomach.     amLODipine (NORVASC) 5 MG tablet Take 5 mg by mouth daily.     atorvastatin (LIPITOR) 40 MG tablet Take 1 tablet (40 mg total) by mouth at bedtime. 90 tablet 0   clopidogrel (PLAVIX) 75 MG tablet Take 1 tablet (75 mg total) by mouth daily. 90 tablet 0   Cobalamin Combinations (NEURIVA PLUS PO) Take 1 capsule by mouth daily.     Cobalamin Combinations (NEURIVA PLUS) CAPS Take by mouth.     fenofibrate (TRICOR) 145 MG tablet Take 1 tablet by mouth daily, need MD visit for further refills 90 tablet 0   Ferrous Sulfate (IRON PO) Take 60 mg by mouth 2 (two) times daily.     fluticasone (FLONASE) 50 MCG/ACT nasal spray Place 2 sprays into the nose daily as needed. Allergic rhinitis.     magnesium oxide (MAG-OX) 400 MG tablet Take 400 mg by mouth daily.     Multiple Vitamin (MULTIVITAMIN WITH MINERALS) TABS Take 1 tablet by mouth daily.     MYRBETRIQ 50 MG TB24 tablet Take 50 mg by mouth daily.     Turmeric 500 MG CAPS Take 1,000 mg by mouth.     UNABLE TO FIND GOLI Applecider Vinegar     valsartan-hydrochlorothiazide (DIOVAN-HCT) 320-12.5 MG tablet TK 1 T PO QD  11   No current facility-administered medications on file prior to visit.    Allergies:   Allergies  Allergen Reactions   Other Other (See Comments)    Anesthesia causes nausea and vomiting, memory difficulty and problems processing.   Oxycodone Nausea Only   Sulfa Antibiotics Rash   Sulfasalazine Rash    Physical Exam Today's Vitals   07/07/22 1359  BP: (!) 134/59  Pulse: 67  Weight: 176 lb 4 oz (79.9 kg)  Height: 5\' 3"  (1.6 m)   Body  mass index is 31.22 kg/m.   General: well developed, well nourished very pleasant elderly Caucasian lady, seated, in no evident distress Head: head normocephalic  and atraumatic.   Neck: supple with no carotid or supraclavicular bruits Cardiovascular: regular rate and rhythm, no murmurs Musculoskeletal: no deformity Skin:  no rash/petichiae Vascular:  Normal pulses all extremities  Neurologic Exam Mental Status: Awake and fully alert.  Unable to appreciate dysarthria or aphasia at today's visit.  Oriented to place and time. Recent memory subjectively impaired and remote memory intact. Attention span, concentration and fund of knowledge appropriate. Mood and affect appropriate.  Cranial Nerves: Pupils equal, briskly reactive to light. Extraocular movements full without nystagmus. Visual fields full to confrontation. Hearing intact. Facial sensation intact.  Right nasolabial fold flattening. No weakness noted on left side or right forehead or eye. tongue and palate moves normally and symmetrically.  Motor: Normal bulk and tone. Normal strength in all tested extremity muscles. Sensory.: intact to touch , pinprick , position and vibratory sensation.  Coordination: Rapid alternating movements normal in all extremities. Finger-to-nose and heel-to-shin performed accurately bilaterally. Gait and Station: Arises from chair without difficulty. Stance is normal. Gait demonstrates normal stride length and balance without use of assistive device. Able to heel, toe and tandem walk without difficulty.  Reflexes: 1+ and symmetric. Toes downgoing.       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  Likely mild age-related cognitive impairment stable.      Right facial weakness, dysarthria -greatly improved/resolved since prior visit  currently with mild right lower facial weakness, no residual dysarthria -Reoccurrence of right facial weakness and dysarthria (initial onset with prior stroke although resolved) likely in setting of RSV illness.  -Repeat MRI brain 03/2022 no acute findings for new findings compared to prior imaging 10/2020   L subcortical stroke -Continue Plavix and atorvastatin 40 mg daily for secondary stroke prevention -maintain strict control of hypertension with blood pressure goal below 130/90 and lipids with LDL cholesterol goal below 70 mg/dL.     Mild cognitive impairment -Stable    Doing well from stroke standpoint without further recommendations and risk factors are managed by PCP. She may follow up PRN, as usual for our patients who are strictly being followed for stroke. If any new neurological issues should arise, request PCP place referral for evaluation by one of our neurologists. Thank you.      CC:  Merlene Laughter, MD   I spent 31 minutes of face-to-face and non-face-to-face time with patient.  This included previsit chart review, lab review, study review, electronic health record documentation, patient education and discussion regarding history of prior stroke, prior worsening right facial weakness and dysarthria and possible etiologies, importance of managing stroke risk factors and secondary stroke prevention measures, mild cognitive impairment and education and answered all other questions to patient satisfaction  Ihor Austin, AGNP-BC  [facility] [address] [place], [place] [address]  Phone [phone] Fax [phone] Note: This document was prepared with digital dictation and possible smart phrase technology. Any transcriptional errors that result from this process are unintentional.

## 2022-07-31 ENCOUNTER — Other Ambulatory Visit: Payer: Self-pay | Admitting: Geriatric Medicine

## 2022-07-31 DIAGNOSIS — Z981 Arthrodesis status: Secondary | ICD-10-CM

## 2022-08-08 ENCOUNTER — Ambulatory Visit: Payer: Medicare Other | Admitting: Cardiology

## 2022-08-08 ENCOUNTER — Encounter: Payer: Self-pay | Admitting: Cardiology

## 2022-08-08 ENCOUNTER — Other Ambulatory Visit
Admission: RE | Admit: 2022-08-08 | Discharge: 2022-08-08 | Disposition: A | Discharge status: Home / Self Care | Payer: Medicare Other | Source: Ambulatory Visit | Attending: Cardiology | Admitting: Cardiology

## 2022-08-08 VITALS — BP 130/64 | HR 75 | Ht 63.0 in | Wt 177.1 lb

## 2022-08-08 DIAGNOSIS — I639 Cerebral infarction, unspecified: Secondary | ICD-10-CM

## 2022-08-08 DIAGNOSIS — R0609 Other forms of dyspnea: Secondary | ICD-10-CM | POA: Insufficient documentation

## 2022-08-08 DIAGNOSIS — R072 Precordial pain: Secondary | ICD-10-CM | POA: Insufficient documentation

## 2022-08-08 DIAGNOSIS — R011 Cardiac murmur, unspecified: Secondary | ICD-10-CM | POA: Diagnosis not present

## 2022-08-08 DIAGNOSIS — I10 Essential (primary) hypertension: Secondary | ICD-10-CM

## 2022-08-08 LAB — BASIC METABOLIC PANEL
Anion gap: 9 (ref 5–15)
BUN: 34 mg/dL — ABNORMAL HIGH (ref 8–23)
CO2: 27 mmol/L (ref 22–32)
Calcium: 9.5 mg/dL (ref 8.9–10.3)
Chloride: 108 mmol/L (ref 98–111)
Creatinine, Ser: 1.32 mg/dL — ABNORMAL HIGH (ref 0.44–1.00)
GFR, Estimated: 41 mL/min — ABNORMAL LOW (ref >60)
Glucose, Bld: 120 mg/dL — ABNORMAL HIGH (ref 70–99)
Potassium: 3.8 mmol/L (ref 3.5–5.1)
Sodium: 144 mmol/L (ref 135–145)

## 2022-08-08 MED ORDER — METOPROLOL TARTRATE 100 MG PO TABS: 100.0000 mg | ORAL_TABLET | Freq: Once | ORAL | 0 refills | Status: DC

## 2022-08-08 MED ORDER — IVABRADINE HCL 5 MG PO TABS: 10.0000 mg | ORAL_TABLET | Freq: Once | ORAL | 0 refills | Status: AC

## 2022-08-08 NOTE — Progress Notes (Signed)
Cardiology Office Note:    Date:  08/08/2022   ID:  Lisa [name], Dr., DOB 05-11-1941, MRN [id number]  PCP:  Merlene Laughter, MD  [facility] Cardiologist:  Debbe Odea, MD  [facility] Electrophysiologist:  None   Referring MD: Merlene Laughter, MD   Chief Complaint  Patient presents with   Other    OD 12 month f/u pt had RSV and since then has been having issues sob, throat rawness and would like to discuss if she has a heart murmur. Meds reviewed verbally with pt.    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  She had an RSV infection roughly 6 months ago causing her to be sedentary for several months.  Endorses fatigue, weakness, shortness of breath with minimal exertion sings.  Has occasional right-sided chest pains, recently saw PCP, murmur noted on exam.  Previously seen after a stroke, in 10/2020 for right facial droop, slurred speech.  Acute left lacunar stroke noted on MRI, cardiac monitor was placed did not show any evidence of A-fib or flutter, echo showed normal EF.  Placed on Plavix, Lipitor.  Has not had any additional strokelike symptoms.  Cardiac monitor results.   Prior notes Echocardiogram 10/2020 EF 55 to 60%, Cardiac monitor 12/2020 1 run of paroxysmal SVT, no A-fib or flutter noted  Past Medical History:  Diagnosis Date   Acne rosacea    Allergic rhinitis    Chronic kidney disease    kidney stones, Nov. 2013- tx with Lithotripsy, still has some present    Cough variant asthma    Degenerative disc disease    knees, ankles, back- OA   GERD (gastroesophageal reflux disease)    Headache(784.0)    since stopping aleve- 07/30/2013   Hyperlipemia    dyslipidemia   Hypertension    [facility] cardiac pharmacist follows pt. 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)- Dr Luther Bradley   Pneumonia    double pneumonia - relative to sinus infection , had chemical cauterization    PONV (postoperative nausea and vomiting)    urgency, N&V for days, memory & processing   Sleep apnea    Bringing cpap mask and tubing, study last done- 2006    Past Surgical History:  Procedure Laterality Date   ABDOMINAL HYSTERECTOMY  1990's   BLADDER REPAIR     BLADDER SUSPENSION  90's   MAXIMUM ACCESS (MAS)POSTERIOR LUMBAR INTERBODY FUSION (PLIF) 2 LEVEL N/A 08/12/2013   Procedure: FOR MAXIMUM ACCESS (MAS) POSTERIOR LUMBAR INTERBODY FUSION (PLIF) 2 LEVEL;  Surgeon: Tia Alert, MD;  Location: [facility];  Service: Neurosurgery;  Laterality: N/A;  FOR MAXIMUM ACCESS (MAS) POSTERIOR LUMBAR INTERBODY FUSION (PLIF) 2 LEVEL   MULTIPLE TOOTH EXTRACTIONS     wisdom teeth extractions    POSTERIOR FUSION LUMBAR SPINE  08/12/2013   Dr Yetta Barre   TONSILLECTOMY      Current Medications: Current Meds  Medication Sig   acetaminophen (TYLENOL) 500 MG tablet Take 500 mg by mouth every 6 (six) hours as needed for pain. 1000 mg in the morning and at night  Additional 1000 mg daily at mid day if needed.   alendronate (FOSAMAX) 70 MG tablet Take 70 mg by mouth once a week. Take with a  full glass of water on an empty stomach.   amLODipine (NORVASC) 5 MG tablet Take 5 mg by mouth daily.   atorvastatin (LIPITOR) 40 MG tablet Take 1 tablet (40 mg total) by mouth at bedtime.   clopidogrel (PLAVIX) 75 MG tablet Take 1 tablet (75 mg total) by mouth daily.   Cobalamin Combinations (NEURIVA PLUS PO) Take 1 capsule by mouth daily.   Cobalamin Combinations (NEURIVA PLUS) CAPS Take by mouth.   fenofibrate (TRICOR) 145 MG tablet Take 1 tablet by mouth daily, need MD visit for further refills   Ferrous Sulfate (IRON PO) Take 60 mg by mouth every other day.   fluticasone (FLONASE) 50 MCG/ACT nasal spray Place 2 sprays into the nose daily as needed. Allergic  rhinitis.   ivabradine (CORLANOR) 5 MG TABS tablet Take 2 tablets (10 mg total) by mouth once for 1 dose. Take 2 hours prior to your CT Scan   magnesium oxide (MAG-OX) 400 MG tablet Take 400 mg by mouth daily.   metoprolol tartrate (LOPRESSOR) 100 MG tablet Take 1 tablet (100 mg total) by mouth once for 1 dose. Take 2 hours prior to your CT scan.   Multiple Vitamin (MULTIVITAMIN WITH MINERALS) TABS Take 1 tablet by mouth daily.   MYRBETRIQ 50 MG TB24 tablet Take 50 mg by mouth daily.   Turmeric 500 MG CAPS Take 1,000 mg by mouth.   UNABLE TO FIND GOLI Applecider Vinegar   valsartan-hydrochlorothiazide (DIOVAN-HCT) 320-12.5 MG tablet TK 1 T PO QD     Allergies:   Other, Oxycodone, Sulfa antibiotics, and Sulfasalazine   Social History   Socioeconomic History   Marital status: Divorced    Spouse name: Not on file   Number of children: Not on file   Years of education: Not on file   Highest education level: Not on file  Occupational History   Occupation: retired  Tobacco Use   Smoking status: Never   Smokeless tobacco: Never  Substance and Sexual Activity   Alcohol use: Yes    Comment: on occassion   Drug use: No   Sexual activity: Not on file  Other Topics Concern   Not on file  Social History Narrative   Lives alone   Left Handed   Drinks 2-3 cups caffeine daily   Social Determinants of Health   Financial Resource Strain: Not on file  Food Insecurity: Not on file  Transportation Needs: Not on file  Physical Activity: Not on file  Stress: Not on file  Social Connections: Not on file     Family History: The patient's family history includes CVA in her father; Heart Problems in her father and mother; Heart attack in her father; Heart disease in her father.  ROS:   Please see the history of present illness.     All other systems reviewed and are negative.  EKGs/Labs/Other Studies Reviewed:    The following studies were reviewed today:   EKG:  EKG is  ordered  today.  The ekg ordered today demonstrates normal sinus rhythm, normal ECG  Recent Labs: No results found for requested labs within last 365 days.  Recent Lipid Panel    Component Value Date/Time   CHOL 177 11/07/2020 0415   TRIG 215 (H) 11/07/2020 0415   HDL 47 11/07/2020 0415   CHOLHDL 3.8 11/07/2020 0415   VLDL 43 (H) 11/07/2020 0415   LDLCALC 87 11/07/2020 0415   LDLDIRECT 105.0 05/07/2015 0813     Risk Assessment/Calculations:  Physical Exam:    VS:  BP 130/64 (BP Location: Left Arm, Patient Position: Sitting, Cuff Size: Normal)   Pulse 75   Ht 5\' 3"  (1.6 m)   Wt 177 lb 2 oz (80.3 kg)   SpO2 97%   BMI 31.38 kg/m     Wt Readings from Last 3 Encounters:  08/08/22 177 lb 2 oz (80.3 kg)  07/07/22 176 lb 4 oz (79.9 kg)  03/27/22 178 lb (80.7 kg)     GEN:  Well nourished, well developed in no acute distress HEENT: Normal NECK: No JVD; No carotid bruits LYMPHATICS: No lymphadenopathy CARDIAC: RRR, 2/6 systolic murmur RESPIRATORY:  Clear to auscultation without rales, wheezing or rhonchi  ABDOMEN: Soft, non-tender, non-distended MUSCULOSKELETAL:  No edema; No deformity  SKIN: Warm and dry NEUROLOGIC:  Alert and oriented x 3 PSYCHIATRIC:  Normal affect   ASSESSMENT:    1. Dyspnea on exertion   2. Precordial pain   3. Systolic murmur   4. Cerebrovascular accident (CVA), unspecified mechanism (HCC)   5. Primary hypertension     PLAN:    In order of problems listed above:  Dyspnea on exertion, possibly from deconditioning.  Get echocardiogram, coronary CTA to rule out CAD.  Graduated exercising, conditioning advised. Chest pain, appears atypical, echo and CTA as above. Systolic murmur on exam, evaluate any structural or valvular dysfunction with echocardiogram ordered. CVA, prior monitor with no A-fib or flutter.  Continue Lipitor, Plavix. Hypertension, BP controlled, continue valsartan, HCTZ.   Follow-up in 6 weeks.   Medication Adjustments/Labs  and Tests Ordered: Current medicines are reviewed at length with the patient today.  Concerns regarding medicines are outlined above.  Orders Placed This Encounter  Procedures   CT CORONARY MORPH W/CTA COR W/SCORE W/CA W/CM &/OR WO/CM   Basic metabolic panel   EKG 12-Lead   ECHOCARDIOGRAM COMPLETE   Meds ordered this encounter  Medications   metoprolol tartrate (LOPRESSOR) 100 MG tablet    Sig: Take 1 tablet (100 mg total) by mouth once for 1 dose. Take 2 hours prior to your CT scan.    Dispense:  1 tablet    Refill:  0   ivabradine (CORLANOR) 5 MG TABS tablet    Sig: Take 2 tablets (10 mg total) by mouth once for 1 dose. Take 2 hours prior to your CT Scan    Dispense:  2 tablet    Refill:  0    Patient Instructions  Medication Instructions:   Your physician recommends that you continue on your current medications as directed. Please refer to the Current Medication list given to you today.  *If you need a refill on your cardiac medications before your next appointment, please call your pharmacy*   Lab Work:  Please go to the [facility] after your appointment today for a lab (BMP) draw.   Testing/Procedures:  Your physician has requested that you have an echocardiogram. Echocardiography is a painless test that uses sound waves to create images of your heart. It provides your doctor with information about the size and shape of your heart and how well your heart's chambers and valves are working. This procedure takes approximately one hour. There are no restrictions for this procedure.  2.    Your physician has requested that you have cardiac CT. Cardiac computed tomography (CT) is a painless test that uses an x-ray machine to take clear, detailed pictures of your heart.    Your cardiac CT will be scheduled at:  [facility] [address] [place] [address] [place], [place] [address] [phone]  Thursday 08/14/22 at 1:15 PM  Please arrive 15  mins early for check-in and test prep.    Please follow these instructions carefully (unless otherwise directed):    On the Night Before the Test: Be sure to Drink plenty of water. Do not consume any caffeinated/decaffeinated beverages or chocolate 12 hours prior to your test.    On the Day of the Test: Drink plenty of water until 1 hour prior to the test. Do not eat any food 4 hours prior to the test. You may take your regular medications prior to the test.  Take metoprolol (Lopressor) 100 MG two hours prior to test. Take Ivabradine (Corlanor) 10 MG two hours prior to test. HOLD valsartan-hydrochlorothiazide  morning of the test. FEMALES- please wear underwire-free bra if available, avoid dresses & tight clothing        After the Test: Drink plenty of water. After receiving IV contrast, you may experience a mild flushed feeling. This is normal. On occasion, you may experience a mild rash up to 24 hours after the test. This is not dangerous. If this occurs, you can take Benadryl 25 mg and increase your fluid intake. If you experience trouble breathing, this can be serious. If it is severe call 911 IMMEDIATELY. If it is mild, please call our office. If you take any of these medications: Glipizide/Metformin, Avandament, Glucavance, please do not take 48 hours after completing test unless otherwise instructed.  Please allow 2-4 weeks for scheduling of routine cardiac CTs. Some insurance companies require a pre-authorization which may delay scheduling of this test.   For non-scheduling related questions, please contact the cardiac imaging nurse navigator should you have any questions/concerns: Rockwell Alexandria, Cardiac Imaging Nurse Navigator Larey Brick, Cardiac Imaging Nurse Navigator [facility] Direct Office Dial: [phone]   For scheduling needs, including cancellations and rescheduling, please call [place], [phone].     Follow-Up: At  [facility], you and your health needs are our priority.  As part of our continuing mission to provide you with exceptional heart care, we have created designated Provider Care Teams.  These Care Teams include your primary Cardiologist (physician) and Advanced Practice Providers (APPs -  Physician Assistants and Nurse Practitioners) who all work together to provide you with the care you need, when you need it.  We recommend signing up for the patient portal called "MyChart".  Sign up information is provided on this After Visit Summary.  MyChart is used to connect with patients for Virtual Visits (Telemedicine).  Patients are able to view lab/test results, encounter notes, upcoming appointments, etc.  Non-urgent messages can be sent to your provider as well.   To learn more about what you can do with MyChart, go to [email or web address].    Your next appointment:   Follow up after testing   The format for your next appointment:   In Person  Provider:   You may see Debbe Odea, MD or one of the following Advanced Practice Providers on your designated Care Team:   Nicolasa Ducking, NP Eula Listen, PA-C Cadence Fransico Michael, [place]    Other Instructions   Important Information About Sugar         Signed, Debbe Odea, MD  08/08/2022 2:56 PM    [facility]

## 2022-08-08 NOTE — Patient Instructions (Signed)
Medication Instructions:   Your physician recommends that you continue on your current medications as directed. Please refer to the Current Medication list given to you today.  *If you need a refill on your cardiac medications before your next appointment, please call your pharmacy*   Lab Work:  Please go to the [facility] after your appointment today for a lab (BMP) draw.   Testing/Procedures:  Your physician has requested that you have an echocardiogram. Echocardiography is a painless test that uses sound waves to create images of your heart. It provides your doctor with information about the size and shape of your heart and how well your heart's chambers and valves are working. This procedure takes approximately one hour. There are no restrictions for this procedure.  2.    Your physician has requested that you have cardiac CT. Cardiac computed tomography (CT) is a painless test that uses an x-ray machine to take clear, detailed pictures of your heart.    Your cardiac CT will be scheduled at:  [facility] [address] [place], [place] [address] [phone]  Thursday 08/14/22 at 1:15 PM  Please arrive 15 mins early for check-in and test prep.    Please follow these instructions carefully (unless otherwise directed):    On the Night Before the Test: Be sure to Drink plenty of water. Do not consume any caffeinated/decaffeinated beverages or chocolate 12 hours prior to your test.    On the Day of the Test: Drink plenty of water until 1 hour prior to the test. Do not eat any food 4 hours prior to the test. You may take your regular medications prior to the test.  Take metoprolol (Lopressor) 100 MG two hours prior to test. Take Ivabradine (Corlanor) 10 MG two hours prior to test. HOLD valsartan-hydrochlorothiazide  morning of the test. FEMALES- please wear underwire-free bra if available, avoid dresses & tight  clothing        After the Test: Drink plenty of water. After receiving IV contrast, you may experience a mild flushed feeling. This is normal. On occasion, you may experience a mild rash up to 24 hours after the test. This is not dangerous. If this occurs, you can take Benadryl 25 mg and increase your fluid intake. If you experience trouble breathing, this can be serious. If it is severe call 911 IMMEDIATELY. If it is mild, please call our office. If you take any of these medications: Glipizide/Metformin, Avandament, Glucavance, please do not take 48 hours after completing test unless otherwise instructed.  Please allow 2-4 weeks for scheduling of routine cardiac CTs. Some insurance companies require a pre-authorization which may delay scheduling of this test.   For non-scheduling related questions, please contact the cardiac imaging nurse navigator should you have any questions/concerns: Rockwell Alexandria, Cardiac Imaging Nurse Navigator Larey Brick, Cardiac Imaging Nurse Navigator [place] [facility] Direct Office Dial: [phone]   For scheduling needs, including cancellations and rescheduling, please call [place], [phone].     Follow-Up: At [facility], you and your health needs are our priority.  As part of our continuing mission to provide you with exceptional heart care, we have created designated Provider Care Teams.  These Care Teams include your primary Cardiologist (physician) and Advanced Practice Providers (APPs -  Physician Assistants and Nurse Practitioners) who all work together to provide you with the care you need, when you need it.  We recommend signing up for the patient portal called "MyChart".  Sign up information is provided on this After Visit Summary.  MyChart is used to connect with patients for Virtual Visits (Telemedicine).  Patients are able to view lab/test results, encounter notes, upcoming appointments, etc.  Non-urgent messages  can be sent to your provider as well.   To learn more about what you can do with MyChart, go to [email or web address].    Your next appointment:   Follow up after testing   The format for your next appointment:   In Person  Provider:   You may see Debbe Odea, MD or one of the following Advanced Practice Providers on your designated Care Team:   Nicolasa Ducking, NP Eula Listen, PA-C Cadence Fransico Michael, [place]    Other Instructions   Important Information About Sugar

## 2022-08-12 ENCOUNTER — Ambulatory Visit
Admission: RE | Admit: 2022-08-12 | Discharge: 2022-08-12 | Disposition: A | Discharge status: Home / Self Care | Payer: Medicare Other | Source: Ambulatory Visit | Attending: Geriatric Medicine | Admitting: Geriatric Medicine

## 2022-08-12 DIAGNOSIS — Z981 Arthrodesis status: Secondary | ICD-10-CM

## 2022-08-12 MED ORDER — GADOBENATE DIMEGLUMINE 529 MG/ML IV SOLN
15.0000 mL | Freq: Once | INTRAVENOUS | Status: AC | PRN
  Administered 2022-08-12: 15 mL via INTRAVENOUS

## 2022-08-13 ENCOUNTER — Telehealth (HOSPITAL_COMMUNITY): Payer: Self-pay | Admitting: Emergency Medicine

## 2022-08-13 NOTE — Telephone Encounter (Signed)
Reaching out to patient to offer assistance regarding upcoming cardiac imaging study; pt verbalizes understanding of appt date/time, parking situation and where to check in, pre-test NPO status and medications ordered, and verified current allergies; name and call back number provided for further questions should they arise Rockwell Alexandria RN Navigator Cardiac Imaging [facility] [phone] office [phone] cell  Arrival 100 100mg  metoprolol + 10mg  ivabradine Hold diovan, flonase R arm preferred

## 2022-08-14 ENCOUNTER — Other Ambulatory Visit: Payer: Self-pay | Admitting: Cardiology

## 2022-08-14 ENCOUNTER — Ambulatory Visit
Admission: RE | Admit: 2022-08-14 | Discharge: 2022-08-14 | Disposition: A | Discharge status: Home / Self Care | Payer: Medicare Other | Source: Ambulatory Visit | Attending: Cardiology | Admitting: Cardiology

## 2022-08-14 DIAGNOSIS — R931 Abnormal findings on diagnostic imaging of heart and coronary circulation: Secondary | ICD-10-CM

## 2022-08-14 DIAGNOSIS — I2510 Atherosclerotic heart disease of native coronary artery without angina pectoris: Secondary | ICD-10-CM | POA: Diagnosis not present

## 2022-08-14 DIAGNOSIS — R072 Precordial pain: Secondary | ICD-10-CM | POA: Diagnosis present

## 2022-08-14 MED ORDER — NITROGLYCERIN 0.4 MG SL SUBL
0.8000 mg | SUBLINGUAL_TABLET | Freq: Once | SUBLINGUAL | Status: AC
  Administered 2022-08-14: 0.8 mg via SUBLINGUAL

## 2022-08-14 MED ORDER — IOHEXOL 350 MG/ML SOLN
75.0000 mL | Freq: Once | INTRAVENOUS | Status: AC | PRN
  Administered 2022-08-14: 75 mL via INTRAVENOUS

## 2022-08-14 NOTE — Progress Notes (Signed)
Patient tolerated procedure well. Ambulate w/o difficulty. Denies light headedness or being dizzy. Sitting in chair drinking water provided. Encouraged to drink extra water today and reasoning explained. Verbalized understanding. All questions answered. ABC intact. No further needs. Discharge from procedure area w/o issues.   °

## 2022-08-15 ENCOUNTER — Telehealth: Payer: Self-pay

## 2022-08-15 ENCOUNTER — Encounter: Payer: Self-pay | Admitting: Cardiology

## 2022-08-15 NOTE — Telephone Encounter (Signed)
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  Please schedule follow-up appointment, patient will need cardiac catheterization.

## 2022-08-15 NOTE — Telephone Encounter (Signed)
The patient has been notified of the result and verbalized understanding.  All questions (if any) were answered. Patient has been scheduled to see Eula Listen, PA  next week on 08/19/22 to discuss left heart cath. Gibson Ramp, RN 08/15/2022 1:40 PM

## 2022-08-19 ENCOUNTER — Encounter: Payer: Self-pay | Admitting: Physician Assistant

## 2022-08-19 ENCOUNTER — Ambulatory Visit: Payer: Medicare Other | Attending: Physician Assistant | Admitting: Physician Assistant

## 2022-08-19 ENCOUNTER — Other Ambulatory Visit
Admission: RE | Admit: 2022-08-19 | Discharge: 2022-08-19 | Disposition: A | Discharge status: Home / Self Care | Payer: Medicare Other | Attending: Cardiovascular Disease | Admitting: Cardiovascular Disease

## 2022-08-19 VITALS — BP 124/58 | HR 68 | Ht 63.0 in | Wt 176.8 lb

## 2022-08-19 DIAGNOSIS — E785 Hyperlipidemia, unspecified: Secondary | ICD-10-CM | POA: Insufficient documentation

## 2022-08-19 DIAGNOSIS — I10 Essential (primary) hypertension: Secondary | ICD-10-CM

## 2022-08-19 DIAGNOSIS — I25118 Atherosclerotic heart disease of native coronary artery with other forms of angina pectoris: Secondary | ICD-10-CM | POA: Diagnosis not present

## 2022-08-19 DIAGNOSIS — Z8673 Personal history of transient ischemic attack (TIA), and cerebral infarction without residual deficits: Secondary | ICD-10-CM

## 2022-08-19 LAB — CBC
HCT: 35.9 % — ABNORMAL LOW (ref 36.0–46.0)
Hemoglobin: 11.7 g/dL — ABNORMAL LOW (ref 12.0–15.0)
MCH: 29.5 pg (ref 26.0–34.0)
MCHC: 32.6 g/dL (ref 30.0–36.0)
MCV: 90.7 fL (ref 80.0–100.0)
Platelets: 281 10*3/uL (ref 150–400)
RBC: 3.96 MIL/uL (ref 3.87–5.11)
RDW: 12.9 % (ref 11.5–15.5)
WBC: 5.2 10*3/uL (ref 4.0–10.5)
nRBC: 0 % (ref 0.0–0.2)

## 2022-08-19 LAB — COMPREHENSIVE METABOLIC PANEL
ALT: 19 U/L (ref 0–44)
AST: 20 U/L (ref 15–41)
Albumin: 4.1 g/dL (ref 3.5–5.0)
Alkaline Phosphatase: 66 U/L (ref 38–126)
Anion gap: 7 (ref 5–15)
BUN: 27 mg/dL — ABNORMAL HIGH (ref 8–23)
CO2: 25 mmol/L (ref 22–32)
Calcium: 9.9 mg/dL (ref 8.9–10.3)
Chloride: 109 mmol/L (ref 98–111)
Creatinine, Ser: 1.07 mg/dL — ABNORMAL HIGH (ref 0.44–1.00)
GFR, Estimated: 53 mL/min — ABNORMAL LOW (ref >60)
Glucose, Bld: 114 mg/dL — ABNORMAL HIGH (ref 70–99)
Potassium: 3.9 mmol/L (ref 3.5–5.1)
Sodium: 141 mmol/L (ref 135–145)
Total Bilirubin: 0.4 mg/dL (ref 0.3–1.2)
Total Protein: 7.5 g/dL (ref 6.5–8.1)

## 2022-08-19 LAB — LIPID PANEL
Cholesterol: 171 mg/dL (ref 0–200)
HDL: 67 mg/dL (ref >40)
LDL Cholesterol: 72 mg/dL (ref 0–99)
Total CHOL/HDL Ratio: 2.6 RATIO
Triglycerides: 160 mg/dL — ABNORMAL HIGH (ref <150)
VLDL: 32 mg/dL (ref 0–40)

## 2022-08-19 NOTE — Patient Instructions (Addendum)
Medication Instructions:  NONE *If you need a refill on your cardiac medications before your next appointment, please call your pharmacy*   Lab Work: Your physician recommends that you return for lab work in:   Please got to [facility] to have lab work drawn today:  CMET/LIPID/DIRECT LDL/CBC  If you have labs (blood work) drawn today and your tests are completely normal, you will receive your results only by: MyChart Message (if you have MyChart) OR A paper copy in the mail If you have any lab test that is abnormal or we need to change your treatment, we will call you to review the results.   Testing/Procedures:  [name], Dr.  08/19/2022  You are scheduled for a Cardiac Catheterization on Wednesday, August 30 with Dr. [name].  1. Please arrive at the [facility] (Main Entrance A) at [facility]: [address] [place], [place] [address] at 8:30 AM (This time is two hours before your procedure to ensure your preparation). Free valet parking service is available.   Special note: Every effort is made to have your procedure done on time. Please understand that emergencies sometimes delay scheduled procedures.  2. Diet: Do not eat solid foods after midnight.  The patient may have clear liquids until 5am upon the day of the procedure.  3. Labs: You will need to have blood drawn on Tuesday, August 29 at [facility] Entrance, Go to 1st desk on your right to register.  Address: [address] [place], [place] [address]  Open: 8am - 5pm  Phone: [phone]. You do not need to be fasting.  4. Medication instructions in preparation for your procedure:   Contrast Allergy: No   -PLEASE HOLD VALSARTAN/HCTZ 320-12.5 MG DAY OF PROCEDURE    On the morning of your procedure, take your Plavix/Clopidogrel and any morning medicines NOT listed above.  You may use sips of water.  5. Plan for one night stay--bring personal belongings. 6. Bring a current list of  your medications and current insurance cards. 7. You MUST have a responsible person to drive you home. 8. Someone MUST be with you the first 24 hours after you arrive home or your discharge will be delayed. 9. Please wear clothes that are easy to get on and off and wear slip-on shoes.  Thank you for allowing [place] to care for you!   -- [facility]    Follow-Up: At [facility], you and your health needs are our priority.  As part of our continuing mission to provide you with exceptional heart care, we have created designated Provider Care Teams.  These Care Teams include your primary Cardiologist (physician) and Advanced Practice Providers (APPs -  Physician Assistants and Nurse Practitioners) who all work together to provide you with the care you need, when you need it.  We recommend signing up for the patient portal called "MyChart".  Sign up information is provided on this After Visit Summary.  MyChart is used to connect with patients for Virtual Visits (Telemedicine).  Patients are able to view lab/test results, encounter notes, upcoming appointments, etc.  Non-urgent messages can be sent to your provider as well.   To learn more about what you can do with MyChart, go to [email or web address].    Your next appointment:    1-2 week(s) after procedure  The format for your next appointment:   In Person  Provider:   You may see Debbe Odea, MD or one of  the following Advanced Practice Providers on your designated Care Team:   Nicolasa Ducking, NP Eula Listen, PA-C Cadence Fransico Michael, PA-C Charlsie Quest, NP      Important Information About Sugar

## 2022-08-19 NOTE — H&P (View-Only) (Signed)
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 female with history of CAD as outlined below, CVA, CKD stage II, HTN, HLD, and GERD who presents for follow-up of coronary CTA.  She was admitted to the hospital in 10/2020 with acute left lacunar stroke.  Outpatient cardiac monitoring showed no evidence of A-fib or flutter.  Echo demonstrated a preserved LV systolic function.  She was placed on Plavix and Lipitor.  She had an RSV infection earlier in 2023 that led her to be quite sedentary with associated fatigue, weakness, dyspnea with singing.  PCP noted a murmur on exam recently as well.  She was evaluated on 08/08/2022 with exertional dyspnea and chest discomfort.  Given symptoms, she underwent coronary CTA on 08/15/2022 which demonstrated a calcium score of 1329 which was the 94th percentile along with distal left main 25% stenosis, proximal LAD greater than 70% stenosis, proximal LCx 25 to 49% stenosis, and aortic root/descending aortic calcification.  ctFFR was positive within the distal left main at 0.5, LAD, and LCx.  ctFFR of the RCA normal at 0.82.  Echo to evaluate murmur is pending.  She comes in accompanied by her daughter today.  She continues to note exertional dyspnea and substernal chest tightness with exertion that dates back at least to 20 12/2021.  With this, she has also experienced a significant amount of fatigue.  No significant lower extremity swelling.  Symptoms improve with rest.  No dizziness, presyncope, or syncope.  Tolerating medications without issues.  Currently chest pain-free.   Labs independently reviewed: 07/2022 - potassium 3.8, BUN 34, serum creatinine 1.32 10/2020 - TC 177, TG 215, HDL 47, LDL 87, A1c  6.1, albumin 4.2, AST/ALT normal, Hgb 12.4, PLT 255  Past Medical History:  Diagnosis Date   Acne rosacea    Allergic rhinitis    Chronic kidney disease    kidney stones, Nov. 2013- tx with Lithotripsy, still has some present    Cough variant asthma    Degenerative disc disease    knees, ankles, back- OA   GERD (gastroesophageal reflux disease)    Headache(784.0)    since stopping aleve- 07/30/2013   Hyperlipemia    dyslipidemia   Hypertension    [facility] cardiac pharmacist follows pt. Riki Rusk Smart ,has never taken anti-hypertensive   Osteoarthritis    Plantar fasciitis    (using orthotics-they are wearing out)- Dr Luther Bradley   Pneumonia    double pneumonia - relative to sinus infection , had chemical cauterization    PONV (postoperative nausea and vomiting)    urgency, N&V for days, memory & processing   Sleep apnea    Bringing cpap mask and tubing, study last done- 2006    Past Surgical History:  Procedure Laterality Date   ABDOMINAL HYSTERECTOMY  1990's   BLADDER REPAIR     BLADDER SUSPENSION  90's   MAXIMUM ACCESS (MAS)POSTERIOR LUMBAR INTERBODY FUSION (PLIF) 2 LEVEL N/A 08/12/2013   Procedure: FOR MAXIMUM ACCESS (MAS) POSTERIOR LUMBAR INTERBODY FUSION (PLIF) 2 LEVEL;  Surgeon: Tia Alert, MD;  Location: [facility];  Service: Neurosurgery;  Laterality: N/A;  FOR MAXIMUM ACCESS (MAS) POSTERIOR LUMBAR INTERBODY FUSION (PLIF) 2 LEVEL   MULTIPLE  TOOTH EXTRACTIONS     wisdom teeth extractions    POSTERIOR FUSION LUMBAR SPINE  08/12/2013   Dr Ronnald Ramp   TONSILLECTOMY      Current Medications: Current Meds  Medication Sig   acetaminophen (TYLENOL) 500 MG tablet Take 500 mg by mouth every 6 (six) hours as needed for pain. 1000 mg in the morning and at night  Additional 1000 mg daily at mid day if needed.   alendronate (FOSAMAX) 70 MG tablet Take 70 mg by mouth once a week. Take with a full glass of water on an empty stomach.   amLODipine (NORVASC) 5 MG tablet Take 5 mg by  mouth daily.   atorvastatin (LIPITOR) 40 MG tablet Take 1 tablet (40 mg total) by mouth at bedtime.   clopidogrel (PLAVIX) 75 MG tablet Take 1 tablet (75 mg total) by mouth daily.   Cobalamin Combinations (NEURIVA PLUS PO) Take 1 capsule by mouth daily.   cyanocobalamin 1000 MCG tablet Take 5,000 mcg by mouth daily.   fenofibrate (TRICOR) 145 MG tablet Take 1 tablet by mouth daily, need MD visit for further refills   Ferrous Sulfate (IRON PO) Take 60 mg by mouth every other day.   fluticasone (FLONASE) 50 MCG/ACT nasal spray Place 2 sprays into the nose daily as needed. Allergic rhinitis.   magnesium oxide (MAG-OX) 400 MG tablet Take 400 mg by mouth daily.   methylcellulose oral powder Take by mouth daily.   Multiple Vitamin (MULTIVITAMIN WITH MINERALS) TABS Take 1 tablet by mouth daily.   Multiple Vitamins-Minerals (EMERGEN-C IMMUNE PO) Take by mouth.   MYRBETRIQ 50 MG TB24 tablet Take 50 mg by mouth daily.   Turmeric 500 MG CAPS Take 1,000 mg by mouth.   UNABLE TO FIND GOLI Applecider Vinegar   valsartan-hydrochlorothiazide (DIOVAN-HCT) 320-12.5 MG tablet TK 1 T PO QD   [DISCONTINUED] Cobalamin Combinations (NEURIVA PLUS) CAPS Take by mouth.    Allergies:   Other, Oxycodone, Sulfa antibiotics, and Sulfasalazine   Social History   Socioeconomic History   Marital status: Divorced    Spouse name: Not on file   Number of children: Not on file   Years of education: Not on file   Highest education level: Not on file  Occupational History   Occupation: retired  Tobacco Use   Smoking status: Never   Smokeless tobacco: Never  Substance and Sexual Activity   Alcohol use: Yes    Comment: on occassion   Drug use: No   Sexual activity: Not on file  Other Topics Concern   Not on file  Social History Narrative   Lives alone   Left Handed   Drinks 2-3 cups caffeine daily   Social Determinants of Health   Financial Resource Strain: Not on file  Food Insecurity: Not on file   Transportation Needs: Not on file  Physical Activity: Not on file  Stress: Not on file  Social Connections: Not on file     Family History:  The patient's family history includes CVA in her father; Heart Problems in her father and mother; Heart attack in her father; Heart disease in her father.  ROS:   12-point review of systems is negative unless otherwise noted in the HPI.   EKGs/Labs/Other Studies Reviewed:    Studies reviewed were summarized above. The additional studies were reviewed today:  Coronary CTA 08/10/2022: Aorta: Normal size. Aortic root and descending aorta calcifications. No dissection.   Aortic Valve:  Trileaflet.  No calcifications.   Coronary Arteries:  Normal coronary origin.  Right dominance.   RCA gives rise to PDA and PLA.  There is no plaque.   Left main gives rise to LAD and LCX arteries. Calcified plaque causing mild distal LM stenosis (25%).   LAD has calcified plaque proximally causing severe stenosis (>70%).   LCX is a non-dominant artery that gives rise to three obtuse marginal branches. There is calcified plaque in the proximal LCx causing mild stenosis (25-49%).   Other findings:   Normal pulmonary vein drainage into the left atrium.   Normal left atrial appendage without a thrombus.   Normal size of the pulmonary artery.   IMPRESSION: 1. Coronary calcium score of 1329. This was 94th percentile for age and sex matched control. 2. Normal coronary origin with right dominance. 3. Severe proximal LAD stenosis (>70%). 4. Mild distal LM and proximal LCx stenosis (25-49%). 5. CAD-RADS 4 Severe stenosis. (70-99% or > 50% left main). Cardiac catheterization or CT FFR is recommended. Consider symptom-guided anti-ischemic pharmacotherapy as well as risk factor modification per guideline directed care. 6. Additional analysis with CT FFR will be submitted and reported separately.  ctFFR 1. Left Main: There is significant stenosis in the  distal LM. FFRct 0.5% 2. LAD: significant stenosis. 3. LCX: significant stenosis. 4. RCA: No significant stenosis.  FFRct 0.82   IMPRESSION: 1.  CT FFR analysis showed significant stenosis in the distal LM. 2.  Recommend cardiac catheterization. __________  Elwyn Reach patch 11/2021: 1 run of paroxysmal SVT.  No evidence of atrial fibrillation or atrial flutter noted. __________  Elwyn Reach patch 10/2020: No A. fib or flutter noted to explain symptoms of stroke. Paroxysmal SVT noted __________  2D echo 10/2020: 1. Left ventricular ejection fraction, by estimation, is 55 to 60%. The  left ventricle has normal function. The left ventricle has no regional  wall motion abnormalities. Left ventricular diastolic parameters are  consistent with Grade I diastolic  dysfunction (impaired relaxation).   2. Right ventricular systolic function is normal. The right ventricular  size is normal.   3. The mitral valve is normal in structure. No evidence of mitral valve  regurgitation. No evidence of mitral stenosis.   4. The aortic valve is tricuspid. Aortic valve regurgitation is not  visualized. No aortic stenosis is present. __________  Carlton Adam MPI 01/08/2018: Nuclear stress EF: 57%. There was no ST segment deviation noted during stress. The study is normal. The left ventricular ejection fraction is normal (55-65%).   1. EF 57%, normal wall motion.  2. No evidence for ischemia/infarction by perfusion imaging.    Normal study.    EKG:  EKG is not ordered today.    Recent Labs: 08/08/2022: BUN 34; Creatinine, Ser 1.32; Potassium 3.8; Sodium 144 08/19/2022: Hemoglobin 11.7; Platelets 281  Recent Lipid Panel    Component Value Date/Time   CHOL 177 11/07/2020 0415   TRIG 215 (H) 11/07/2020 0415   HDL 47 11/07/2020 0415   CHOLHDL 3.8 11/07/2020 0415   VLDL 43 (H) 11/07/2020 0415   LDLCALC 87 11/07/2020 0415   LDLDIRECT 105.0 05/07/2015 0813    PHYSICAL EXAM:    VS:  BP (!) 124/58 (BP  Location: Left Arm, Patient Position: Sitting, Cuff Size: Large)   Pulse 68   Ht 5\' 3"  (1.6 m)   Wt 176 lb 12.8 oz (80.2 kg)   SpO2 96%   BMI 31.32 kg/m   BMI: Body mass index is 31.32 kg/m.  Physical Exam Vitals reviewed.  Constitutional:  Appearance: She is well-developed.  HENT:     Head: Normocephalic and atraumatic.  Eyes:     General:        Right eye: No discharge.        Left eye: No discharge.  Neck:     Vascular: No JVD.  Cardiovascular:     Rate and Rhythm: Normal rate and regular rhythm.     Pulses:          Posterior tibial pulses are 2+ on the right side and 2+ on the left side.     Heart sounds: S1 normal and S2 normal. Heart sounds not distant. No midsystolic click and no opening snap. Murmur heard.     Systolic murmur is present with a grade of 1/6 at the upper right sternal border.     No friction rub.  Pulmonary:     Effort: Pulmonary effort is normal. No respiratory distress.     Breath sounds: Normal breath sounds. No decreased breath sounds, wheezing or rales.  Chest:     Chest wall: No tenderness.  Abdominal:     General: There is no distension.  Musculoskeletal:     Cervical back: Normal range of motion.     Right lower leg: No edema.     Left lower leg: No edema.  Skin:    General: Skin is warm and dry.     Nails: There is no clubbing.  Neurological:     Mental Status: She is alert and oriented to person, place, and time.  Psychiatric:        Speech: Speech normal.        Behavior: Behavior normal.        Thought Content: Thought content normal.        Judgment: Judgment normal.     Wt Readings from Last 3 Encounters:  08/19/22 176 lb 12.8 oz (80.2 kg)  08/08/22 177 lb 2 oz (80.3 kg)  07/07/22 176 lb 4 oz (79.9 kg)     ASSESSMENT & PLAN:   CAD involving native coronary arteries with stable angina and abnormal coronary CTA: Currently chest pain-free.  She reports an approximate 36-month history of exertional chest tightness and  dyspnea.  Coronary CTA was suggestive of distal left main, proximal LAD and LCx stenosis with positive ctFFR in those territories.  Schedule LHC to be [facility] at [facility] given coronary CTA results.  Continue aggressive risk factor modification and current medical therapy including amlodipine, atorvastatin, and clopidogrel.  We will also pursue an echo to evaluate for any cardiomyopathy given her dyspnea.  Murmur: Echo scheduled.  HTN: Blood pressure is well controlled in the office today.  Continue current medical therapy.  HLD: LDL 87 in 10/2020.  Goal LDL at least less than 70.  Check CMP, lipid panel, and direct LDL with recommendation to escalate lipid-lowering therapy as indicated to achieve target LDL.  History of CVA: No new deficits.  She remains on clopidogrel.  Follow-up with PCP as directed.  CKD stage II: Check renal function today.   Shared Decision Making/Informed Consent{  The risks [stroke (1 in 1000), death (1 in 1000), kidney failure [usually temporary] (1 in 500), bleeding (1 in 200), allergic reaction [possibly serious] (1 in 200)], benefits (diagnostic support and management of coronary artery disease) and alternatives of a cardiac catheterization were discussed in detail with Lisa [name] and she is willing to proceed.     Disposition: F/u with Dr. Garen Lah or an APP 1 to  2 weeks after cardiac cath.   Medication Adjustments/Labs and Tests Ordered: Current medicines are reviewed at length with the patient today.  Concerns regarding medicines are outlined above. Medication changes, Labs and Tests ordered today are summarized above and listed in the Patient Instructions accessible in Encounters.   Signed, Eula Listen, PA-C 08/19/2022 10:23 AM     [facility] [place] [address] [place], [place] [address] [phone]

## 2022-08-19 NOTE — Progress Notes (Signed)
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 female with history of CAD as outlined below, CVA, CKD stage II, HTN, HLD, and GERD who presents for follow-up of coronary CTA.  She was admitted to the hospital in 10/2020 with acute left lacunar stroke.  Outpatient cardiac monitoring showed no evidence of A-fib or flutter.  Echo demonstrated a preserved LV systolic function.  She was placed on Plavix and Lipitor.  She had an RSV infection earlier in 2023 that led her to be quite sedentary with associated fatigue, weakness, dyspnea with singing.  PCP noted a murmur on exam recently as well.  She was evaluated on 08/08/2022 with exertional dyspnea and chest discomfort.  Given symptoms, she underwent coronary CTA on 08/15/2022 which demonstrated a calcium score of 1329 which was the 94th percentile along with distal left main 25% stenosis, proximal LAD greater than 70% stenosis, proximal LCx 25 to 49% stenosis, and aortic root/descending aortic calcification.  ctFFR was positive within the distal left main at 0.5, LAD, and LCx.  ctFFR of the RCA normal at 0.82.  Echo to evaluate murmur is pending.  She comes in accompanied by her daughter today.  She continues to note exertional dyspnea and substernal chest tightness with exertion that dates back at least to 20 12/2021.  With this, she has also experienced a significant amount of fatigue.  No significant lower extremity swelling.  Symptoms improve with rest.  No dizziness, presyncope, or syncope.  Tolerating medications without issues.  Currently chest pain-free.   Labs independently reviewed: 07/2022 - potassium 3.8, BUN 34, serum creatinine 1.32 10/2020 - TC 177, TG 215, HDL 47, LDL 87, A1c  6.1, albumin 4.2, AST/ALT normal, Hgb 12.4, PLT 255  Past Medical History:  Diagnosis Date   Acne rosacea    Allergic rhinitis    Chronic kidney disease    kidney stones, Nov. 2013- tx with Lithotripsy, still has some present    Cough variant asthma    Degenerative disc disease    knees, ankles, back- OA   GERD (gastroesophageal reflux disease)    Headache(784.0)    since stopping aleve- 07/30/2013   Hyperlipemia    dyslipidemia   Hypertension    [facility] pharmacist follows pt. Riki Rusk Smart ,has never taken anti-hypertensive   Osteoarthritis    Plantar fasciitis    (using orthotics-they are wearing out)- Dr Luther Bradley   Pneumonia    double pneumonia - relative to sinus infection , had chemical cauterization    PONV (postoperative nausea and vomiting)    urgency, N&V for days, memory & processing   Sleep apnea    Bringing cpap mask and tubing, study last done- 2006    Past Surgical History:  Procedure Laterality Date   ABDOMINAL HYSTERECTOMY  1990's   BLADDER REPAIR     BLADDER SUSPENSION  90's   MAXIMUM ACCESS (MAS)POSTERIOR LUMBAR INTERBODY FUSION (PLIF) 2 LEVEL N/A 08/12/2013   Procedure: FOR MAXIMUM ACCESS (MAS) POSTERIOR LUMBAR INTERBODY FUSION (PLIF) 2 LEVEL;  Surgeon: Tia Alert, MD;  Location: [facility];  Service: Neurosurgery;  Laterality: N/A;  FOR MAXIMUM ACCESS (MAS) POSTERIOR LUMBAR INTERBODY FUSION (PLIF) 2 LEVEL   MULTIPLE  TOOTH EXTRACTIONS     wisdom teeth extractions    POSTERIOR FUSION LUMBAR SPINE  08/12/2013   Dr Ronnald Ramp   TONSILLECTOMY      Current Medications: Current Meds  Medication Sig   acetaminophen (TYLENOL) 500 MG tablet Take 500 mg by mouth every 6 (six) hours as needed for pain. 1000 mg in the morning and at night  Additional 1000 mg daily at mid day if needed.   alendronate (FOSAMAX) 70 MG tablet Take 70 mg by mouth once a week. Take with a full glass of water on an empty stomach.   amLODipine (NORVASC) 5 MG tablet Take 5 mg by  mouth daily.   atorvastatin (LIPITOR) 40 MG tablet Take 1 tablet (40 mg total) by mouth at bedtime.   clopidogrel (PLAVIX) 75 MG tablet Take 1 tablet (75 mg total) by mouth daily.   Cobalamin Combinations (NEURIVA PLUS PO) Take 1 capsule by mouth daily.   cyanocobalamin 1000 MCG tablet Take 5,000 mcg by mouth daily.   fenofibrate (TRICOR) 145 MG tablet Take 1 tablet by mouth daily, need MD visit for further refills   Ferrous Sulfate (IRON PO) Take 60 mg by mouth every other day.   fluticasone (FLONASE) 50 MCG/ACT nasal spray Place 2 sprays into the nose daily as needed. Allergic rhinitis.   magnesium oxide (MAG-OX) 400 MG tablet Take 400 mg by mouth daily.   methylcellulose oral powder Take by mouth daily.   Multiple Vitamin (MULTIVITAMIN WITH MINERALS) TABS Take 1 tablet by mouth daily.   Multiple Vitamins-Minerals (EMERGEN-C IMMUNE PO) Take by mouth.   MYRBETRIQ 50 MG TB24 tablet Take 50 mg by mouth daily.   Turmeric 500 MG CAPS Take 1,000 mg by mouth.   UNABLE TO FIND GOLI Applecider Vinegar   valsartan-hydrochlorothiazide (DIOVAN-HCT) 320-12.5 MG tablet TK 1 T PO QD   [DISCONTINUED] Cobalamin Combinations (NEURIVA PLUS) CAPS Take by mouth.    Allergies:   Other, Oxycodone, Sulfa antibiotics, and Sulfasalazine   Social History   Socioeconomic History   Marital status: Divorced    Spouse name: Not on file   Number of children: Not on file   Years of education: Not on file   Highest education level: Not on file  Occupational History   Occupation: retired  Tobacco Use   Smoking status: Never   Smokeless tobacco: Never  Substance and Sexual Activity   Alcohol use: Yes    Comment: on occassion   Drug use: No   Sexual activity: Not on file  Other Topics Concern   Not on file  Social History Narrative   Lives alone   Left Handed   Drinks 2-3 cups caffeine daily   Social Determinants of Health   Financial Resource Strain: Not on file  Food Insecurity: Not on file   Transportation Needs: Not on file  Physical Activity: Not on file  Stress: Not on file  Social Connections: Not on file     Family History:  The patient's family history includes CVA in her father; Heart Problems in her father and mother; Heart attack in her father; Heart disease in her father.  ROS:   12-point review of systems is negative unless otherwise noted in the HPI.   EKGs/Labs/Other Studies Reviewed:    Studies reviewed were summarized above. The additional studies were reviewed today:  Coronary CTA 08/10/2022: Aorta: Normal size. Aortic root and descending aorta calcifications. No dissection.   Aortic Valve:  Trileaflet.  No calcifications.   Coronary Arteries:  Normal coronary origin.  Right dominance.   RCA gives rise to PDA and PLA.  There is no plaque.   Left main gives rise to LAD and LCX arteries. Calcified plaque causing mild distal LM stenosis (25%).   LAD has calcified plaque proximally causing severe stenosis (>70%).   LCX is a non-dominant artery that gives rise to three obtuse marginal branches. There is calcified plaque in the proximal LCx causing mild stenosis (25-49%).   Other findings:   Normal pulmonary vein drainage into the left atrium.   Normal left atrial appendage without a thrombus.   Normal size of the pulmonary artery.   IMPRESSION: 1. Coronary calcium score of 1329. This was 94th percentile for age and sex matched control. 2. Normal coronary origin with right dominance. 3. Severe proximal LAD stenosis (>70%). 4. Mild distal LM and proximal LCx stenosis (25-49%). 5. CAD-RADS 4 Severe stenosis. (70-99% or > 50% left main). Cardiac catheterization or CT FFR is recommended. Consider symptom-guided anti-ischemic pharmacotherapy as well as risk factor modification per guideline directed care. 6. Additional analysis with CT FFR will be submitted and reported separately.  ctFFR 1. Left Main: There is significant stenosis in the  distal LM. FFRct 0.5% 2. LAD: significant stenosis. 3. LCX: significant stenosis. 4. RCA: No significant stenosis.  FFRct 0.82   IMPRESSION: 1.  CT FFR analysis showed significant stenosis in the distal LM. 2.  Recommend cardiac catheterization. __________  Elwyn Reach patch 11/2021: 1 run of paroxysmal SVT.  No evidence of atrial fibrillation or atrial flutter noted. __________  Elwyn Reach patch 10/2020: No A. fib or flutter noted to explain symptoms of stroke. Paroxysmal SVT noted __________  2D echo 10/2020: 1. Left ventricular ejection fraction, by estimation, is 55 to 60%. The  left ventricle has normal function. The left ventricle has no regional  wall motion abnormalities. Left ventricular diastolic parameters are  consistent with Grade I diastolic  dysfunction (impaired relaxation).   2. Right ventricular systolic function is normal. The right ventricular  size is normal.   3. The mitral valve is normal in structure. No evidence of mitral valve  regurgitation. No evidence of mitral stenosis.   4. The aortic valve is tricuspid. Aortic valve regurgitation is not  visualized. No aortic stenosis is present. __________  Carlton Adam MPI 01/08/2018: Nuclear stress EF: 57%. There was no ST segment deviation noted during stress. The study is normal. The left ventricular ejection fraction is normal (55-65%).   1. EF 57%, normal wall motion.  2. No evidence for ischemia/infarction by perfusion imaging.    Normal study.    EKG:  EKG is not ordered today.    Recent Labs: 08/08/2022: BUN 34; Creatinine, Ser 1.32; Potassium 3.8; Sodium 144 08/19/2022: Hemoglobin 11.7; Platelets 281  Recent Lipid Panel    Component Value Date/Time   CHOL 177 11/07/2020 0415   TRIG 215 (H) 11/07/2020 0415   HDL 47 11/07/2020 0415   CHOLHDL 3.8 11/07/2020 0415   VLDL 43 (H) 11/07/2020 0415   LDLCALC 87 11/07/2020 0415   LDLDIRECT 105.0 05/07/2015 0813    PHYSICAL EXAM:    VS:  BP (!) 124/58 (BP  Location: Left Arm, Patient Position: Sitting, Cuff Size: Large)   Pulse 68   Ht 5\' 3"  (1.6 m)   Wt 176 lb 12.8 oz (80.2 kg)   SpO2 96%   BMI 31.32 kg/m   BMI: Body mass index is 31.32 kg/m.  Physical Exam Vitals reviewed.  Constitutional:  Appearance: She is well-developed.  HENT:     Head: Normocephalic and atraumatic.  Eyes:     General:        Right eye: No discharge.        Left eye: No discharge.  Neck:     Vascular: No JVD.  Cardiovascular:     Rate and Rhythm: Normal rate and regular rhythm.     Pulses:          Posterior tibial pulses are 2+ on the right side and 2+ on the left side.     Heart sounds: S1 normal and S2 normal. Heart sounds not distant. No midsystolic click and no opening snap. Murmur heard.     Systolic murmur is present with a grade of 1/6 at the upper right sternal border.     No friction rub.  Pulmonary:     Effort: Pulmonary effort is normal. No respiratory distress.     Breath sounds: Normal breath sounds. No decreased breath sounds, wheezing or rales.  Chest:     Chest wall: No tenderness.  Abdominal:     General: There is no distension.  Musculoskeletal:     Cervical back: Normal range of motion.     Right lower leg: No edema.     Left lower leg: No edema.  Skin:    General: Skin is warm and dry.     Nails: There is no clubbing.  Neurological:     Mental Status: She is alert and oriented to person, place, and time.  Psychiatric:        Speech: Speech normal.        Behavior: Behavior normal.        Thought Content: Thought content normal.        Judgment: Judgment normal.     Wt Readings from Last 3 Encounters:  08/19/22 176 lb 12.8 oz (80.2 kg)  08/08/22 177 lb 2 oz (80.3 kg)  07/07/22 176 lb 4 oz (79.9 kg)     ASSESSMENT & PLAN:   CAD involving native coronary arteries with stable angina and abnormal coronary CTA: Currently chest pain-free.  She reports an approximate 16-month history of exertional chest tightness and  dyspnea.  Coronary CTA was suggestive of distal left main, proximal LAD and LCx stenosis with positive ctFFR in those territories.  Schedule LHC to be completed at [facility] given coronary CTA results.  Continue aggressive risk factor modification and current medical therapy including amlodipine, atorvastatin, and clopidogrel.  We will also pursue an echo to evaluate for any cardiomyopathy given her dyspnea.  Murmur: Echo scheduled.  HTN: Blood pressure is well controlled in the office today.  Continue current medical therapy.  HLD: LDL 87 in 10/2020.  Goal LDL at least less than 70.  Check CMP, lipid panel, and direct LDL with recommendation to escalate lipid-lowering therapy as indicated to achieve target LDL.  History of CVA: No new deficits.  She remains on clopidogrel.  Follow-up with PCP as directed.  CKD stage II: Check renal function today.   Shared Decision Making/Informed Consent{  The risks [stroke (1 in 1000), death (1 in 1000), kidney failure [usually temporary] (1 in 500), bleeding (1 in 200), allergic reaction [possibly serious] (1 in 200)], benefits (diagnostic support and management of coronary artery disease) and alternatives of a cardiac catheterization were discussed in detail with Ms. Cassis and she is willing to proceed.     Disposition: F/u with Dr. Garen Lah or an APP 1 to  2 weeks after cardiac cath.   Medication Adjustments/Labs and Tests Ordered: Current medicines are reviewed at length with the patient today.  Concerns regarding medicines are outlined above. Medication changes, Labs and Tests ordered today are summarized above and listed in the Patient Instructions accessible in Encounters.   Signed, Eula Listen, PA-C 08/19/2022 10:23 AM     [facility] [place] [address] [place], [place] [address] [phone]

## 2022-08-20 ENCOUNTER — Inpatient Hospital Stay (HOSPITAL_COMMUNITY): Payer: Medicare Other

## 2022-08-20 ENCOUNTER — Inpatient Hospital Stay (HOSPITAL_COMMUNITY)
Admission: AD | Admit: 2022-08-20 | Discharge: 2022-09-02 | DRG: 234 | Disposition: A | Discharge status: Skilled Nursing Facility | Payer: Medicare Other | Attending: Cardiothoracic Surgery | Admitting: Cardiothoracic Surgery

## 2022-08-20 ENCOUNTER — Encounter (HOSPITAL_COMMUNITY)
Admission: AD | Disposition: A | Discharge status: Skilled Nursing Facility | Payer: Self-pay | Source: Home / Self Care | Attending: Cardiothoracic Surgery

## 2022-08-20 ENCOUNTER — Other Ambulatory Visit: Payer: Self-pay

## 2022-08-20 DIAGNOSIS — J45991 Cough variant asthma: Secondary | ICD-10-CM | POA: Diagnosis present

## 2022-08-20 DIAGNOSIS — L719 Rosacea, unspecified: Secondary | ICD-10-CM | POA: Diagnosis present

## 2022-08-20 DIAGNOSIS — K219 Gastro-esophageal reflux disease without esophagitis: Secondary | ICD-10-CM | POA: Diagnosis present

## 2022-08-20 DIAGNOSIS — R079 Chest pain, unspecified: Secondary | ICD-10-CM

## 2022-08-20 DIAGNOSIS — Z8701 Personal history of pneumonia (recurrent): Secondary | ICD-10-CM | POA: Diagnosis not present

## 2022-08-20 DIAGNOSIS — Z8673 Personal history of transient ischemic attack (TIA), and cerebral infarction without residual deficits: Secondary | ICD-10-CM

## 2022-08-20 DIAGNOSIS — Z951 Presence of aortocoronary bypass graft: Secondary | ICD-10-CM | POA: Diagnosis not present

## 2022-08-20 DIAGNOSIS — M479 Spondylosis, unspecified: Secondary | ICD-10-CM | POA: Diagnosis not present

## 2022-08-20 DIAGNOSIS — I25110 Atherosclerotic heart disease of native coronary artery with unstable angina pectoris: Principal | ICD-10-CM | POA: Diagnosis present

## 2022-08-20 DIAGNOSIS — M4800 Spinal stenosis, site unspecified: Secondary | ICD-10-CM | POA: Diagnosis not present

## 2022-08-20 DIAGNOSIS — E7800 Pure hypercholesterolemia, unspecified: Secondary | ICD-10-CM | POA: Diagnosis present

## 2022-08-20 DIAGNOSIS — D62 Acute posthemorrhagic anemia: Secondary | ICD-10-CM | POA: Diagnosis not present

## 2022-08-20 DIAGNOSIS — Z741 Need for assistance with personal care: Secondary | ICD-10-CM | POA: Diagnosis not present

## 2022-08-20 DIAGNOSIS — M48061 Spinal stenosis, lumbar region without neurogenic claudication: Secondary | ICD-10-CM | POA: Diagnosis present

## 2022-08-20 DIAGNOSIS — Z20822 Contact with and (suspected) exposure to covid-19: Secondary | ICD-10-CM | POA: Diagnosis not present

## 2022-08-20 DIAGNOSIS — Z87442 Personal history of urinary calculi: Secondary | ICD-10-CM | POA: Diagnosis not present

## 2022-08-20 DIAGNOSIS — Z8619 Personal history of other infectious and parasitic diseases: Secondary | ICD-10-CM

## 2022-08-20 DIAGNOSIS — I700 Atherosclerosis of aorta: Secondary | ICD-10-CM | POA: Diagnosis present

## 2022-08-20 DIAGNOSIS — Z452 Encounter for adjustment and management of vascular access device: Secondary | ICD-10-CM | POA: Diagnosis not present

## 2022-08-20 DIAGNOSIS — N183 Chronic kidney disease, stage 3 unspecified: Secondary | ICD-10-CM | POA: Diagnosis not present

## 2022-08-20 DIAGNOSIS — E8770 Fluid overload, unspecified: Secondary | ICD-10-CM | POA: Diagnosis not present

## 2022-08-20 DIAGNOSIS — Z7902 Long term (current) use of antithrombotics/antiplatelets: Secondary | ICD-10-CM

## 2022-08-20 DIAGNOSIS — Z885 Allergy status to narcotic agent status: Secondary | ICD-10-CM

## 2022-08-20 DIAGNOSIS — M47816 Spondylosis without myelopathy or radiculopathy, lumbar region: Secondary | ICD-10-CM | POA: Diagnosis present

## 2022-08-20 DIAGNOSIS — I517 Cardiomegaly: Secondary | ICD-10-CM | POA: Diagnosis not present

## 2022-08-20 DIAGNOSIS — J811 Chronic pulmonary edema: Secondary | ICD-10-CM | POA: Diagnosis not present

## 2022-08-20 DIAGNOSIS — G4730 Sleep apnea, unspecified: Secondary | ICD-10-CM | POA: Diagnosis not present

## 2022-08-20 DIAGNOSIS — I129 Hypertensive chronic kidney disease with stage 1 through stage 4 chronic kidney disease, or unspecified chronic kidney disease: Secondary | ICD-10-CM | POA: Diagnosis not present

## 2022-08-20 DIAGNOSIS — N182 Chronic kidney disease, stage 2 (mild): Secondary | ICD-10-CM | POA: Diagnosis present

## 2022-08-20 DIAGNOSIS — I2510 Atherosclerotic heart disease of native coronary artery without angina pectoris: Secondary | ICD-10-CM | POA: Diagnosis not present

## 2022-08-20 DIAGNOSIS — Z8249 Family history of ischemic heart disease and other diseases of the circulatory system: Secondary | ICD-10-CM

## 2022-08-20 DIAGNOSIS — I25119 Atherosclerotic heart disease of native coronary artery with unspecified angina pectoris: Secondary | ICD-10-CM | POA: Diagnosis not present

## 2022-08-20 DIAGNOSIS — N179 Acute kidney failure, unspecified: Secondary | ICD-10-CM | POA: Diagnosis not present

## 2022-08-20 DIAGNOSIS — R0789 Other chest pain: Secondary | ICD-10-CM | POA: Diagnosis present

## 2022-08-20 DIAGNOSIS — J309 Allergic rhinitis, unspecified: Secondary | ICD-10-CM | POA: Diagnosis not present

## 2022-08-20 DIAGNOSIS — I081 Rheumatic disorders of both mitral and tricuspid valves: Secondary | ICD-10-CM | POA: Diagnosis not present

## 2022-08-20 DIAGNOSIS — M722 Plantar fascial fibromatosis: Secondary | ICD-10-CM | POA: Diagnosis present

## 2022-08-20 DIAGNOSIS — J984 Other disorders of lung: Secondary | ICD-10-CM | POA: Diagnosis not present

## 2022-08-20 DIAGNOSIS — I471 Supraventricular tachycardia: Secondary | ICD-10-CM | POA: Diagnosis present

## 2022-08-20 DIAGNOSIS — Z823 Family history of stroke: Secondary | ICD-10-CM

## 2022-08-20 DIAGNOSIS — M6281 Muscle weakness (generalized): Secondary | ICD-10-CM | POA: Diagnosis not present

## 2022-08-20 DIAGNOSIS — Z79899 Other long term (current) drug therapy: Secondary | ICD-10-CM

## 2022-08-20 DIAGNOSIS — G4733 Obstructive sleep apnea (adult) (pediatric): Secondary | ICD-10-CM | POA: Diagnosis present

## 2022-08-20 DIAGNOSIS — I480 Paroxysmal atrial fibrillation: Secondary | ICD-10-CM | POA: Diagnosis not present

## 2022-08-20 DIAGNOSIS — Z01810 Encounter for preprocedural cardiovascular examination: Secondary | ICD-10-CM | POA: Diagnosis not present

## 2022-08-20 DIAGNOSIS — I10 Essential (primary) hypertension: Secondary | ICD-10-CM | POA: Diagnosis not present

## 2022-08-20 DIAGNOSIS — Z48812 Encounter for surgical aftercare following surgery on the circulatory system: Secondary | ICD-10-CM | POA: Diagnosis not present

## 2022-08-20 DIAGNOSIS — R918 Other nonspecific abnormal finding of lung field: Secondary | ICD-10-CM | POA: Diagnosis not present

## 2022-08-20 DIAGNOSIS — Z882 Allergy status to sulfonamides status: Secondary | ICD-10-CM

## 2022-08-20 DIAGNOSIS — R278 Other lack of coordination: Secondary | ICD-10-CM | POA: Diagnosis not present

## 2022-08-20 DIAGNOSIS — I080 Rheumatic disorders of both mitral and aortic valves: Secondary | ICD-10-CM | POA: Diagnosis not present

## 2022-08-20 DIAGNOSIS — I6523 Occlusion and stenosis of bilateral carotid arteries: Secondary | ICD-10-CM | POA: Diagnosis not present

## 2022-08-20 DIAGNOSIS — Z981 Arthrodesis status: Secondary | ICD-10-CM

## 2022-08-20 DIAGNOSIS — I200 Unstable angina: Secondary | ICD-10-CM | POA: Diagnosis not present

## 2022-08-20 DIAGNOSIS — I4891 Unspecified atrial fibrillation: Secondary | ICD-10-CM | POA: Diagnosis not present

## 2022-08-20 DIAGNOSIS — Z9989 Dependence on other enabling machines and devices: Secondary | ICD-10-CM | POA: Diagnosis not present

## 2022-08-20 DIAGNOSIS — Z90710 Acquired absence of both cervix and uterus: Secondary | ICD-10-CM

## 2022-08-20 DIAGNOSIS — J90 Pleural effusion, not elsewhere classified: Secondary | ICD-10-CM | POA: Diagnosis not present

## 2022-08-20 HISTORY — PX: LEFT HEART CATH AND CORONARY ANGIOGRAPHY: CATH118249

## 2022-08-20 LAB — ECHOCARDIOGRAM COMPLETE
Area-P 1/2: 3.34 cm2
Height: 63 in
S' Lateral: 3.6 cm
Weight: 2828.59 oz

## 2022-08-20 LAB — LDL CHOLESTEROL, DIRECT: Direct LDL: 92 mg/dL (ref 0–99)

## 2022-08-20 SURGERY — LEFT HEART CATH AND CORONARY ANGIOGRAPHY
Anesthesia: LOCAL

## 2022-08-20 MED ORDER — FLUTICASONE PROPIONATE 50 MCG/ACT NA SUSP
2.0000 | Freq: Every day | NASAL | Status: DC
  Administered 2022-08-21 – 2022-08-26 (×6): 2 via NASAL
  Filled 2022-08-20: qty 16

## 2022-08-20 MED ORDER — VERAPAMIL HCL 2.5 MG/ML IV SOLN
INTRAVENOUS | Status: DC | PRN
  Administered 2022-08-20: 10 mL via INTRA_ARTERIAL

## 2022-08-20 MED ORDER — SODIUM CHLORIDE 0.9% FLUSH
3.0000 mL | Freq: Two times a day (BID) | INTRAVENOUS | Status: DC
  Administered 2022-08-20 – 2022-08-21 (×2): 3 mL via INTRAVENOUS

## 2022-08-20 MED ORDER — SODIUM CHLORIDE 0.9 % WEIGHT BASED INFUSION
3.0000 mL/kg/h | INTRAVENOUS | Status: DC
  Administered 2022-08-20: 3 mL/kg/h via INTRAVENOUS

## 2022-08-20 MED ORDER — SODIUM CHLORIDE 0.9 % IV SOLN: 250.0000 mL | INTRAVENOUS | Status: DC | PRN

## 2022-08-20 MED ORDER — FENTANYL CITRATE (PF) 100 MCG/2ML IJ SOLN
INTRAMUSCULAR | Status: DC | PRN
Start: 2022-08-20 — End: 2022-08-20
  Administered 2022-08-20: 25 ug via INTRAVENOUS

## 2022-08-20 MED ORDER — HEPARIN SODIUM (PORCINE) 1000 UNIT/ML IJ SOLN
INTRAMUSCULAR | Status: AC
Start: 2022-08-20 — End: ?
  Filled 2022-08-20: qty 10

## 2022-08-20 MED ORDER — HEPARIN (PORCINE) IN NACL 1000-0.9 UT/500ML-% IV SOLN
INTRAVENOUS | Status: DC | PRN
  Administered 2022-08-20 (×2): 500 mL

## 2022-08-20 MED ORDER — FENTANYL CITRATE (PF) 100 MCG/2ML IJ SOLN
INTRAMUSCULAR | Status: AC
  Filled 2022-08-20: qty 2

## 2022-08-20 MED ORDER — MIRABEGRON ER 50 MG PO TB24
50.0000 mg | ORAL_TABLET | Freq: Every day | ORAL | Status: DC
Start: 2022-08-20 — End: 2022-08-26
  Administered 2022-08-20 – 2022-08-25 (×6): 50 mg via ORAL
  Filled 2022-08-20 (×6): qty 1

## 2022-08-20 MED ORDER — SODIUM CHLORIDE 0.9 % IV SOLN: INTRAVENOUS | Status: AC

## 2022-08-20 MED ORDER — HEPARIN SODIUM (PORCINE) 1000 UNIT/ML IJ SOLN
INTRAMUSCULAR | Status: DC | PRN
  Administered 2022-08-20: 4000 [IU] via INTRAVENOUS

## 2022-08-20 MED ORDER — LIDOCAINE HCL (PF) 1 % IJ SOLN
INTRAMUSCULAR | Status: DC | PRN
  Administered 2022-08-20: 2 mL

## 2022-08-20 MED ORDER — CLOPIDOGREL BISULFATE 75 MG PO TABS: 75.0000 mg | ORAL_TABLET | ORAL | Status: DC

## 2022-08-20 MED ORDER — HEPARIN (PORCINE) IN NACL 1000-0.9 UT/500ML-% IV SOLN
INTRAVENOUS | Status: AC
Start: 2022-08-20 — End: ?
  Filled 2022-08-20: qty 1000

## 2022-08-20 MED ORDER — MIDAZOLAM HCL 2 MG/2ML IJ SOLN
INTRAMUSCULAR | Status: AC
  Filled 2022-08-20: qty 2

## 2022-08-20 MED ORDER — IOHEXOL 350 MG/ML SOLN
INTRAVENOUS | Status: DC | PRN
  Administered 2022-08-20: 50 mL

## 2022-08-20 MED ORDER — ONDANSETRON HCL 4 MG/2ML IJ SOLN: 4.0000 mg | Freq: Four times a day (QID) | INTRAMUSCULAR | Status: DC | PRN

## 2022-08-20 MED ORDER — ATORVASTATIN CALCIUM 40 MG PO TABS
40.0000 mg | ORAL_TABLET | Freq: Every day | ORAL | Status: DC
Start: 2022-08-20 — End: 2022-08-21
  Administered 2022-08-20: 40 mg via ORAL
  Filled 2022-08-20: qty 1

## 2022-08-20 MED ORDER — MAGNESIUM 250 MG PO TABS
250.0000 mg | ORAL_TABLET | Freq: Every day | ORAL | Status: DC
Start: 2022-08-20 — End: 2022-08-20

## 2022-08-20 MED ORDER — SODIUM CHLORIDE 0.9 % WEIGHT BASED INFUSION: 1.0000 mL/kg/h | INTRAVENOUS | Status: DC

## 2022-08-20 MED ORDER — HEPARIN (PORCINE) 25000 UT/250ML-% IV SOLN
1300.0000 [IU]/h | INTRAVENOUS | Status: DC
  Administered 2022-08-20 (×2): 950 [IU]/h via INTRAVENOUS
  Administered 2022-08-21: 1200 [IU]/h via INTRAVENOUS
  Administered 2022-08-22: 1400 [IU]/h via INTRAVENOUS
  Administered 2022-08-23 – 2022-08-25 (×4): 1300 [IU]/h via INTRAVENOUS
  Filled 2022-08-20 (×7): qty 250

## 2022-08-20 MED ORDER — FENOFIBRATE 160 MG PO TABS
160.0000 mg | ORAL_TABLET | Freq: Every day | ORAL | Status: DC
  Administered 2022-08-20 – 2022-08-25 (×6): 160 mg via ORAL
  Filled 2022-08-20 (×7): qty 1

## 2022-08-20 MED ORDER — METOPROLOL TARTRATE 25 MG PO TABS
25.0000 mg | ORAL_TABLET | Freq: Two times a day (BID) | ORAL | Status: DC
  Administered 2022-08-20 – 2022-08-25 (×12): 25 mg via ORAL
  Filled 2022-08-20 (×12): qty 1

## 2022-08-20 MED ORDER — MIDAZOLAM HCL 2 MG/2ML IJ SOLN
INTRAMUSCULAR | Status: DC | PRN
  Administered 2022-08-20: 1 mg via INTRAVENOUS

## 2022-08-20 MED ORDER — ASPIRIN 325 MG PO TBEC
325.0000 mg | DELAYED_RELEASE_TABLET | Freq: Every day | ORAL | Status: DC
  Administered 2022-08-20: 325 mg via ORAL
  Filled 2022-08-20: qty 1

## 2022-08-20 MED ORDER — SODIUM CHLORIDE 0.9% FLUSH: 3.0000 mL | INTRAVENOUS | Status: DC | PRN

## 2022-08-20 MED ORDER — AMLODIPINE BESYLATE 5 MG PO TABS
5.0000 mg | ORAL_TABLET | Freq: Every day | ORAL | Status: DC
Start: 2022-08-20 — End: 2022-08-26
  Administered 2022-08-20 – 2022-08-25 (×6): 5 mg via ORAL
  Filled 2022-08-20 (×6): qty 1

## 2022-08-20 MED ORDER — OLOPATADINE HCL 0.1 % OP SOLN: 1.0000 [drp] | Freq: Every day | OPHTHALMIC | Status: DC

## 2022-08-20 MED ORDER — ACETAMINOPHEN 325 MG PO TABS
650.0000 mg | ORAL_TABLET | ORAL | Status: DC | PRN
  Administered 2022-08-21 – 2022-08-25 (×3): 650 mg via ORAL
  Filled 2022-08-20 (×3): qty 2

## 2022-08-20 MED ORDER — SODIUM CHLORIDE 0.9% FLUSH
3.0000 mL | Freq: Two times a day (BID) | INTRAVENOUS | Status: DC
  Administered 2022-08-21: 3 mL via INTRAVENOUS

## 2022-08-20 MED ORDER — VERAPAMIL HCL 2.5 MG/ML IV SOLN
INTRAVENOUS | Status: AC
  Filled 2022-08-20: qty 2

## 2022-08-20 MED ORDER — LIDOCAINE HCL (PF) 1 % IJ SOLN
INTRAMUSCULAR | Status: AC
  Filled 2022-08-20: qty 30

## 2022-08-20 MED ORDER — SODIUM CHLORIDE 0.9% FLUSH
3.0000 mL | INTRAVENOUS | Status: DC | PRN
Start: 2022-08-20 — End: 2022-08-20

## 2022-08-20 MED ORDER — FERROUS SULFATE 325 (65 FE) MG PO TABS
324.0000 mg | ORAL_TABLET | ORAL | Status: DC
Start: 2022-08-21 — End: 2022-08-25
  Administered 2022-08-21: 324 mg via ORAL
  Administered 2022-08-23: 325 mg via ORAL
  Administered 2022-08-25: 324 mg via ORAL
  Filled 2022-08-20 (×6): qty 1

## 2022-08-20 SURGICAL SUPPLY — 12 items
BAND CMPR LRG ZPHR (HEMOSTASIS) ×1
BAND ZEPHYR COMPRESS 30 LONG (HEMOSTASIS) IMPLANT
CATH INFINITI 5FR JK (CATHETERS) IMPLANT
CATH INFINITI JR4 5F (CATHETERS) IMPLANT
GLIDESHEATH SLEND SS 6F .021 (SHEATH) IMPLANT
GUIDEWIRE INQWIRE 1.5J.035X260 (WIRE) IMPLANT
INQWIRE 1.5J .035X260CM (WIRE) ×1
KIT HEART LEFT (KITS) ×2 IMPLANT
PACK CARDIAC CATHETERIZATION (CUSTOM PROCEDURE TRAY) ×2 IMPLANT
SHEATH PROBE COVER 6X72 (BAG) IMPLANT
TRANSDUCER W/STOPCOCK (MISCELLANEOUS) ×2 IMPLANT
TUBING CIL FLEX 10 FLL-RA (TUBING) ×2 IMPLANT

## 2022-08-20 NOTE — Progress Notes (Signed)
ANTICOAGULATION CONSULT NOTE - Initial Consult  Pharmacy Consult for heparin  Indication: chest pain/ACS  Allergies  Allergen Reactions   Other Other (See Comments)    Anesthesia causes nausea and vomiting, memory difficulty and problems processing.   Oxycodone Nausea Only   Sulfa Antibiotics Rash   Sulfasalazine Rash    Patient Measurements: Heparin dosing weight= 70kg  Vital Signs: Temp: 97.6 F (36.4 C) (08/30 0939) BP: 131/49 (08/30 1315) Pulse Rate: 69 (08/30 1315)  Labs: Recent Labs    08/19/22 0957  HGB 11.7*  HCT 35.9*  PLT 281  CREATININE 1.07*    Estimated Creatinine Clearance: 42 mL/min (A) (by C-G formula based on SCr of 1.07 mg/dL (H)).   Medical History: Past Medical History:  Diagnosis Date   Acne rosacea    Allergic rhinitis    Chronic kidney disease    kidney stones, Nov. 2013- tx with Lithotripsy, still has some present    Cough variant asthma    Degenerative disc disease    knees, ankles, back- OA   GERD (gastroesophageal reflux disease)    Headache(784.0)    since stopping aleve- 07/30/2013   Hyperlipemia    dyslipidemia   Hypertension    [facility] follows pt. Riki Rusk Smart ,has never taken anti-hypertensive   Osteoarthritis    Plantar fasciitis    (using orthotics-they are wearing out)- Dr Luther Bradley   Pneumonia    double pneumonia - relative to sinus infection , had chemical cauterization    PONV (postoperative nausea and vomiting)    urgency, N&V for days, memory & processing   Sleep apnea    Bringing cpap mask and tubing, study last done- 2006    Medications:  Medications Prior to Admission  Medication Sig Dispense Refill Last Dose   acetaminophen (TYLENOL) 500 MG tablet Take 1,000 mg by mouth See admin instructions. 1000 mg in the morning and at night  Additional 1000 mg daily at mid day if needed.      alendronate (FOSAMAX) 70 MG tablet Take 70 mg by mouth once a week. Take with a full glass of water on an  empty stomach.  Saturday Morning   Past Week   amLODipine (NORVASC) 5 MG tablet Take 5 mg by mouth at bedtime.   08/19/2022   atorvastatin (LIPITOR) 40 MG tablet Take 1 tablet (40 mg total) by mouth at bedtime. 90 tablet 0 08/19/2022   Cholecalciferol (VITAMIN D-3) 125 MCG (5000 UT) TABS Take 5,000 Units by mouth daily.   08/19/2022   clopidogrel (PLAVIX) 75 MG tablet Take 1 tablet (75 mg total) by mouth daily. 90 tablet 0 08/20/2022 at 0530   Cobalamin Combinations (NEURIVA PLUS PO) Take 1 capsule by mouth daily.   08/19/2022   fenofibrate (TRICOR) 145 MG tablet Take 1 tablet by mouth daily, need MD visit for further refills (Patient taking differently: Take 145 mg by mouth at bedtime.) 90 tablet 0 08/19/2022   ferrous sulfate 324 MG TBEC Take 324 mg by mouth every other day.   08/19/2022   FIBER PO Take 1 tablet by mouth every morning. Fiber well gummy   08/19/2022   fluticasone (FLONASE) 50 MCG/ACT nasal spray Place 2 sprays into the nose daily. Allergic rhinitis.   08/20/2022 at 0530   hydrochlorothiazide (HYDRODIURIL) 12.5 MG tablet Take 12.5 mg by mouth daily.   08/19/2022   Magnesium 250 MG TABS Take 250 mg by mouth at bedtime.   08/19/2022   Multiple Vitamin (MULTIVITAMIN WITH  MINERALS) TABS Take 1 tablet by mouth daily. Centrum  Woman 50+   08/19/2022   Multiple Vitamins-Minerals (EMERGEN-C IMMUNE PO) Take 1,500 mg by mouth daily. 750 mg each gummy   08/19/2022   MYRBETRIQ 50 MG TB24 tablet Take 50 mg by mouth at bedtime.   08/19/2022   olopatadine (PATADAY) 0.1 % ophthalmic solution Place 1 drop into both eyes daily.      OVER THE COUNTER MEDICATION Take 1 tablet by mouth daily. Nervive      Polyethyl Glycol-Propyl Glycol (SYSTANE OP) Place 1 drop into both eyes daily as needed (Dry eyes).      TURMERIC PO Take 1,000 mg by mouth 2 (two) times daily.   08/19/2022   UNABLE TO FIND Take 1 tablet by mouth daily. GOLI Applecider Vinegar /Gummy   Past Month   Scheduled:   aspirin EC  325 mg Oral Daily    sodium chloride flush  3 mL Intravenous Q12H    Assessment 81 yo female s/p cath with left main disease for possible CABG. Pharmacy to start heparin 8 hours after sheath removal (removed ~ 10:30am). He was not on anticoagulation at home.    Goal of Therapy:  Heparin level 0.3-0.7 units/ml Monitor platelets by anticoagulation protocol: Yes   Plan:  -Start heparin 950 units/hr at 6:30pm -heparin level and CBC in am  Harland German, PharmD Clinical Pharmacist **Pharmacist phone directory can now be found on [email or web address] ([email or web address]).  Listed under [facility].

## 2022-08-20 NOTE — Interval H&P Note (Signed)
Cath Lab Visit (complete for each Cath Lab visit)  Clinical Evaluation Leading to the Procedure:   ACS: No.  Non-ACS:    Anginal Classification: CCS III  Anti-ischemic medical therapy: Minimal Therapy (1 class of medications)  Non-Invasive Test Results: High-risk stress test findings: cardiac mortality >3%/year  Prior CABG: No previous CABG      History and Physical Interval Note:  08/20/2022 10:06 AM  Lisa [name], Dr.  has presented today for surgery, with the diagnosis of abnormal ct.  The various methods of treatment have been discussed with the patient and family. After consideration of risks, benefits and other options for treatment, the patient has consented to  Procedure(s): LEFT HEART CATH AND CORONARY ANGIOGRAPHY (N/A) as a surgical intervention.  The patient's history has been reviewed, patient examined, no change in status, stable for surgery.  I have reviewed the patient's chart and labs.  Questions were answered to the patient's satisfaction.     Lisa [name]

## 2022-08-20 NOTE — Consult Note (Cosign Needed)
[address]       [place] [address]             [phone]        Caffie Pinto, Dr. 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  She also has a history of RSV infection earlier this year with sequela of fatigue, weakness and dyspnea.  She recently requested medical evaluation for chest tightness and dyspnea on exertion .  She said she was noticed the symptoms with activities such as washing her hair or walking.  She was seen by her cardiologist about 2 weeks ago and was advised to have coronary CT. The study was conducted on 8/25 and demonstrated a coronary calcium score of 1329 which was in the 94th percentile for her demographic profile.  This study also suggested a significant left main coronary artery stenosis.  Dr. Christell Faith recommended proceeding with left heart catheterization.  She presented today for elective left heart cath which confirmed a 90% distal left main coronary artery stenosis with extension into the LAD.  Additionally, there is a proximal 99% lesion in the circumflex coronary artery and an 85% mid RCA stenosis.  The LV end diastolic pressure was mildly elevated but ejection fraction was preserved at 55 to 65%.  Her cardiologist does not feel that her coronary anatomy is amenable to percutaneous interventions. CT surgery has been asked to evaluate Ms. Hermans for consideration of surgical  revascularization for symptomatic multivessel and left main coronary artery stenoses.  Ms. Christoff is currently resting in bed on 6E.  Her daughter is at the bedside.  IV heparin is infusing.  Ms. Tuley denies chest pain or shortness of breath while at rest.  She lives by herself at [facility] near [place].  She tells me the facility has a fairly elaborate fitness facility and both physical and occupational therapist on staff.  She continues to drive.  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   She sees a dentist every 6 months and says her teeth in generally good repair.  She has an old bridge in the upper left jaw that she thinks has had some deterioration recently. She describes the stroke she had in 2020 as "mild".  This was manifested by slurred speech and right facial droop.  Symptoms resolved without any residual neurologic deficits.   Current Activity/ Functional Status: Patient is independent with mobility/ambulation, transfers, ADL's, IADL's.   Zubrod Score: At the time of surgery this patient's most appropriate activity status/level should be described as: []     0    Normal activity, no symptoms []     1    Restricted in physical strenuous activity but ambulatory, able to do out light work [x]     2    Ambulatory and capable of self care, unable to do work activities, up and about  more than 50%  Of the time                            []     3    Only limited self care, in bed greater than 50% of waking hours []     4    Completely disabled, no self care, confined to bed or chair []     5    Moribund  Past Medical History:  Diagnosis Date   Acne rosacea    Allergic rhinitis    Chronic kidney disease    kidney stones, Nov. 2013- tx with Lithotripsy, still has some present    Cough variant asthma    Degenerative disc disease    knees, ankles, back- OA   GERD  (gastroesophageal reflux disease)    Headache(784.0)    since stopping aleve- 07/30/2013   Hyperlipemia    dyslipidemia   Hypertension    [facility] cardiac pharmacist follows pt. Smart ,has never taken anti-hypertensive   Osteoarthritis    Plantar fasciitis    (using orthotics-they are wearing out)- Dr 06-20-1971   Pneumonia    double pneumonia - relative to sinus infection , had chemical cauterization    PONV (postoperative nausea and vomiting)    urgency, N&V for days, memory & processing   Sleep apnea    Bringing cpap mask and tubing, study last done- 2006    Past Surgical History:  Procedure Laterality Date   ABDOMINAL HYSTERECTOMY  1990's   BLADDER REPAIR     BLADDER SUSPENSION  90's   MAXIMUM ACCESS (MAS)POSTERIOR LUMBAR INTERBODY FUSION (PLIF) 2 LEVEL N/A 08/12/2013   Procedure: FOR MAXIMUM ACCESS (MAS) POSTERIOR LUMBAR INTERBODY FUSION (PLIF) 2 LEVEL;  Surgeon: Luther Bradley, MD;  Location: [facility];  Service: Neurosurgery;  Laterality: N/A;  FOR MAXIMUM ACCESS (MAS) POSTERIOR LUMBAR INTERBODY FUSION (PLIF) 2 LEVEL   MULTIPLE TOOTH EXTRACTIONS     wisdom teeth extractions    POSTERIOR FUSION LUMBAR SPINE  08/12/2013   Dr 08/14/2013   TONSILLECTOMY      Social History   Tobacco Use  Smoking Status Never  Smokeless Tobacco Never    Social History   Substance and Sexual Activity  Alcohol Use Yes   Comment: on occassion     Allergies  Allergen Reactions   Other Other (See Comments)    Anesthesia causes nausea and vomiting, memory difficulty and problems processing.   Oxycodone Nausea Only   Sulfa Antibiotics Rash   Sulfasalazine Rash    Current Facility-Administered Medications  Medication Dose Route Frequency Provider Last Rate Last Admin   0.9 %  sodium chloride infusion  250 mL Intravenous PRN Dunn, Ryan M, PA-C       0.9 %  sodium chloride infusion   Intravenous Continuous Yetta Barre A, MD 75 mL/hr at 08/20/22 1059 75 mL/hr at 08/20/22 1059    0.9% sodium chloride infusion  1 mL/kg/hr Intravenous Continuous [name] M, PA-C 80.2 mL/hr at 08/20/22 1035 1 mL/kg/hr at 08/20/22 1035   aspirin EC tablet 325 mg  325 mg Oral Daily M, MD       clopidogrel (PLAVIX) tablet 75 mg  75 mg Oral Pre-Cath Dunn, Ryan M, PA-C       fentaNYL (SUBLIMAZE) injection    PRN 08/22/22, MD   25 mcg at 08/20/22 1006   Heparin (Porcine) in NaCl 1000-0.9 UT/500ML-% SOLN  PRN Iran Ouch, MD   500 mL at 08/20/22 0959   heparin sodium (porcine) injection    PRN Iran Ouch, MD   4,000 Units at 08/20/22 1022   iohexol (OMNIPAQUE) 350 MG/ML injection    PRN Iran Ouch, MD   50 mL at 08/20/22 1035   lidocaine (PF) (XYLOCAINE) 1 % injection    PRN Iran Ouch, MD   2 mL at 08/20/22 1015   midazolam (VERSED) injection    PRN Iran Ouch, MD   1 mg at 08/20/22 1006   ondansetron (ZOFRAN) injection 4 mg  4 mg Intravenous Q6H PRN Iran Ouch, MD       Radial Cocktail/Verapamil only    PRN Iran Ouch, MD   10 mL at 08/20/22 1020   sodium chloride flush (NS) 0.9 % injection 3 mL  3 mL Intravenous Q12H Dunn, Ryan M, PA-C       sodium chloride flush (NS) 0.9 % injection 3 mL  3 mL Intravenous PRN Sondra Barges, PA-C        Medications Prior to Admission  Medication Sig Dispense Refill Last Dose   acetaminophen (TYLENOL) 500 MG tablet Take 1,000 mg by mouth See admin instructions. 1000 mg in the morning and at night  Additional 1000 mg daily at mid day if needed.      alendronate (FOSAMAX) 70 MG tablet Take 70 mg by mouth once a week. Take with a full glass of water on an empty stomach.  Saturday Morning   Past Week   amLODipine (NORVASC) 5 MG tablet Take 5 mg by mouth at bedtime.   08/19/2022   atorvastatin (LIPITOR) 40 MG tablet Take 1 tablet (40 mg total) by mouth at bedtime. 90 tablet 0 08/19/2022   Cholecalciferol (VITAMIN D-3) 125 MCG (5000 UT) TABS Take 5,000 Units by mouth daily.   08/19/2022    clopidogrel (PLAVIX) 75 MG tablet Take 1 tablet (75 mg total) by mouth daily. 90 tablet 0 08/20/2022 at 0530   Cobalamin Combinations (NEURIVA PLUS PO) Take 1 capsule by mouth daily.   08/19/2022   fenofibrate (TRICOR) 145 MG tablet Take 1 tablet by mouth daily, need MD visit for further refills (Patient taking differently: Take 145 mg by mouth at bedtime.) 90 tablet 0 08/19/2022   ferrous sulfate 324 MG TBEC Take 324 mg by mouth every other day.   08/19/2022   FIBER PO Take 1 tablet by mouth every morning. Fiber well gummy   08/19/2022   fluticasone (FLONASE) 50 MCG/ACT nasal spray Place 2 sprays into the nose daily. Allergic rhinitis.   08/20/2022 at 0530   hydrochlorothiazide (HYDRODIURIL) 12.5 MG tablet Take 12.5 mg by mouth daily.   08/19/2022   Magnesium 250 MG TABS Take 250 mg by mouth at bedtime.   08/19/2022   Multiple Vitamin (MULTIVITAMIN WITH MINERALS) TABS Take 1 tablet by mouth daily. Centrum  Woman 50+   08/19/2022   Multiple Vitamins-Minerals (EMERGEN-C IMMUNE PO) Take 1,500 mg by mouth daily. 750 mg each gummy   08/19/2022   MYRBETRIQ 50 MG TB24 tablet Take 50 mg by mouth at bedtime.   08/19/2022   olopatadine (PATADAY) 0.1 % ophthalmic solution Place 1 drop into both eyes daily.      OVER THE COUNTER MEDICATION Take 1 tablet by mouth daily. Nervive      Polyethyl Glycol-Propyl Glycol (SYSTANE OP) Place 1 drop into both eyes daily as needed (Dry eyes).  TURMERIC PO Take 1,000 mg by mouth 2 (two) times daily.   08/19/2022   UNABLE TO FIND Take 1 tablet by mouth daily. GOLI Applecider Vinegar /Gummy   Past Month    Family History  Problem Relation Age of Onset   Heart Problems Mother    Heart Problems Father    Heart attack Father    CVA Father    Heart disease Father      Review of Systems:     Cardiac Review of Systems: Y or  [    ]= no  Chest Pain [tightness]  Resting SOB [   ] Exertional SOB  [ x ]  Orthopnea [ x ]   Pedal Edema [   ]    Palpitations [  ] Syncope  [  ]    Presyncope [   ]  General Review of Systems: [Y] = yes [  ]=no Constitional: recent weight change [  ]; anorexia [  ]; fatigue [  ]; nausea [  ]; night sweats [  ]; fever [  ]; or chills [  ]                                                               Dental: Last Dentist visit: About 2 months ago  Eye : blurred vision [  ]; diplopia [   ]; vision changes [  ];  Amaurosis fugax[  ]; Resp: cough [  ];  wheezing[  ];  hemoptysis[  ]; shortness of breath[ x ]; paroxysmal nocturnal dyspnea[  ]; dyspnea on exertion[ x ]; or orthopnea[  ];  GI:  gallstones[  ], vomiting[  ];  dysphagia[  ]; melena[  ];  hematochezia [  ]; heartburn[  ];   Hx of  Colonoscopy[  ]; GU: kidney stones [  ]; hematuria[  ];   dysuria [  ];  nocturia[  ];  history of     obstruction [  ]; urinary frequency [  ]             Skin: rash, swelling[  ];, hair loss[  ];  peripheral edema[  ];  or itching[  ]; Musculosketetal: myalgias[  ];  joint swelling[  ];  joint erythema[  ];  joint pain[  ];  back pain[  ];  Heme/Lymph: bruising[  ];  bleeding[  ];  anemia[  ];  Neuro: TIA[  ];  headaches[  ];  stroke[  ];  vertigo[  ];  seizures[  ];   paresthesias[  ];  difficulty walking[  ];  Psych:depression[  ]; anxiety[  ];  Endocrine: diabetes[  ];  thyroid dysfunction[  ];         Physical Exam: BP (!) 152/54   Pulse 74   Temp 97.6 F (36.4 C)   Resp 13   SpO2 97%    General appearance: alert, cooperative, and no distress Head: Normocephalic, without obvious abnormality, atraumatic Neck: no adenopathy, no carotid bruit, no JVD, and supple, symmetrical, trachea midline Lymph nodes: No cervical or clavicular adenopathy Resp: clear to auscultation bilaterally Cardio: Regular rate and rhythm, there is a grade 3/6 systolic murmur GI: Soft, nontender, active bowel sounds Extremities: All are warm and well-perfused.  All distal pulses  are palpable.  There is no peripheral edema.  No deformities.  No significant varicosities  in the lower extremities. Neurologic: Grossly normal  Diagnostic Studies & Laboratory data:  LEFT HEART CATH AND CORONARY ANGIOGRAPHY   Conclusion      Mid LM to Ost LAD lesion is 90% stenosed.   Ost Cx to Prox Cx lesion is 99% stenosed.   Prox LAD to Mid LAD lesion is 30% stenosed.   Prox RCA lesion is 85% stenosed.   The left ventricular systolic function is normal.   LV end diastolic pressure is mildly elevated.   The left ventricular ejection fraction is 55-65% by visual estimate.   1.  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  The coronary arteries are heavily calcified. 2.  Normal LV systolic function mildly elevated left ventricular end-diastolic pressure.  No significant gradient across aortic valve.   Recommendations: Recommend CABG for revascularization.  Given the patient's symptoms at rest, recommend admission to the hospital and initiation of heparin drip.  Unfortunately, the patient is on clopidogrel therapy as an outpatient and this was discontinued today.  CABG can likely be performed early next week.  The coronary anatomy is not favorable for PCI.    Coronary Findings  Diagnostic Dominance: Co-dominant Left Main  Mid LM to Ost LAD lesion is 90% stenosed. The lesion is severely calcified.    Left Anterior Descending  Prox LAD to Mid LAD lesion is 30% stenosed.    Left Circumflex  Ost Cx to Prox Cx lesion is 99% stenosed. The lesion is severely calcified.    First Obtuse Marginal Branch  Vessel is small in size.    Second Obtuse Marginal Branch  Vessel is small in size.    Third Obtuse Marginal Branch  Vessel is large in size. Vessel is angiographically normal.    First Left Posterolateral Branch  Vessel is large in size. Vessel is angiographically normal.    Second Left Posterolateral Branch  Vessel is large  in size. Vessel is angiographically normal.    Left Posterior Atrioventricular Artery  Vessel is angiographically normal.    Right Coronary Artery  Prox RCA lesion is 85% stenosed.    Intervention   No interventions have been documented.   Left Heart  Left Ventricle The left ventricular size is normal. The left ventricular systolic function is normal. LV end diastolic pressure is mildly elevated. The left ventricular ejection fraction is 55-65% by visual estimate. No regional wall motion abnormalities.   Coronary Diagrams   Diagnostic Dominance: Co-dominant       Recent Radiology Findings:     I have independently reviewed the above radiologic studies and discussed with the patient   Recent Lab Findings: Lab Results  Component Value Date   WBC 5.2 08/19/2022   HGB 11.7 (L) 08/19/2022   HCT 35.9 (L) 08/19/2022   PLT 281 08/19/2022   GLUCOSE 114 (H) 08/19/2022   CHOL 171 08/19/2022   TRIG 160 (H) 08/19/2022   HDL 67 08/19/2022   LDLDIRECT 92 08/19/2022   LDLCALC 72 08/19/2022   ALT 19 08/19/2022   AST 20 08/19/2022   NA 141 08/19/2022   K 3.9 08/19/2022   CL 109 08/19/2022   CREATININE 1.07 (H) 08/19/2022   BUN 27 (H) 08/19/2022   CO2 25 08/19/2022   TSH 1.330 11/21/2020   INR 1.0 11/06/2020  HGBA1C 6.1 (H) 11/07/2020      Assessment / Plan:     -Coronary artery disease pleasant 81 year old female with recent onset exertional chest tightness and shortness of breath is discovered to have severe multivessel coronary artery disease and a high-grade distal left main coronary artery stenosis on elective left heart catheterization earlier today.   She appears to have good distal targets for grafting and we agree that coronary bypass grafting is her best option for revascularization.  The procedure and expected perioperative recovery is discussed with her in detail.  She would like to proceed with planning and preparation for surgery.  She has been on Plavix for  the last few years following a lacunar CVA.  Her last dose was yesterday.  She will need 5-day washout prior to surgery.  We will tentatively plan for coronary bypass grafting on Tuesday, September 5 by Dr. Maren Beach.   She does have a systolic murmur.  An echocardiogram has been ordered but has not yet been performed.  We will follow-up on results.  Should she require any surgical treatment for valvular disease, we would recommend full dental evaluation before proceeding with surgery.  -History of CVA 11/21-treated with aspirin and Plavix since that time.  No residual neurologic deficits  -Dyslipidemia  -History of obstructive sleep apnea.  She uses CPAP at home  -History of hypertension  -History of spinal stenosis-history of spinal surgery with persistent back pain which limits her mobility  I  spent 25 minutes counseling the patient face to face.   Leary Roca, PA-C  08/20/2022 11:29 AM  Patient examined, images of coronary angiogram and 2D echocardiogram personally reviewed and counseled with patient. 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  She has been having exertional symptoms of extreme shortness of breath and neck discomfort.  Cardiac CT was followed by cardiac catheterization which confirmed significant left main stenosis, proximal ostial 90% circumflex stenosis and 85% proximal RCA stenosis.  LV systolic function is well-preserved.  An echocardiogram she has some thickening of the aortic valve/aortic sclerosis but no significant gradient, no aortic stenosis.  Per cardiology team has recommended surgical coronary revascularization.  The patient has been living at a high functional level independently in her villa at a retirement  center in [place].  She uses a cane to walk due to spinal stenosis and low back arthritis.  She has had low back surgery in  several low back injections.  She was scheduled to see her spinal surgeon when she started developing her symptoms of angina.  Patient had a mild stroke of the left hemisphere 2 years ago with full recovery.  She has been on Plavix since then.  Her other medical problems include sleep apnea using CPAP, dyslipidemia, GERD and mild chronic kidney disease.  She has no symptoms of other vascular disease and pre-CABG Dopplers are pending.  The patient meets criteria for benefit from multivessel CABG after Plavix washout which will be scheduled for Tuesday, September 5.  Results of her pre-CABG Dopplers and PFTs and orthopantogram will be reviewed.  She understands she will need to remain in the hospital on IV heparin until surgery.  patient examined and medical record reviewed,agree with above note. Lovett Sox 08/21/2022

## 2022-08-20 NOTE — Progress Notes (Signed)
TR BAND REMOVAL  LOCATION:    Right radial  DEFLATED PER PROTOCOL:    Yes.    TIME BAND OFF / DRESSING APPLIED:    1300p a clean dry dressing applied with gauze and tegaderm   SITE UPON ARRIVAL:    Level 0  SITE AFTER BAND REMOVAL:    Level 0  CIRCULATION SENSATION AND MOVEMENT:    Within Normal Limits   Yes.    COMMENTS:   Care instruction given to patient

## 2022-08-21 ENCOUNTER — Inpatient Hospital Stay (HOSPITAL_COMMUNITY): Payer: Medicare Other

## 2022-08-21 ENCOUNTER — Inpatient Hospital Stay: Payer: Self-pay

## 2022-08-21 ENCOUNTER — Encounter (HOSPITAL_COMMUNITY): Payer: Self-pay | Admitting: Cardiovascular Disease

## 2022-08-21 DIAGNOSIS — I200 Unstable angina: Secondary | ICD-10-CM

## 2022-08-21 LAB — RENAL FUNCTION PANEL
Albumin: 3.4 g/dL — ABNORMAL LOW (ref 3.5–5.0)
Anion gap: 9 (ref 5–15)
BUN: 24 mg/dL — ABNORMAL HIGH (ref 8–23)
CO2: 23 mmol/L (ref 22–32)
Calcium: 8.9 mg/dL (ref 8.9–10.3)
Chloride: 111 mmol/L (ref 98–111)
Creatinine, Ser: 1.14 mg/dL — ABNORMAL HIGH (ref 0.44–1.00)
GFR, Estimated: 49 mL/min — ABNORMAL LOW (ref >60)
Glucose, Bld: 112 mg/dL — ABNORMAL HIGH (ref 70–99)
Phosphorus: 3.7 mg/dL (ref 2.5–4.6)
Potassium: 4.3 mmol/L (ref 3.5–5.1)
Sodium: 143 mmol/L (ref 135–145)

## 2022-08-21 LAB — CBC
HCT: 34.1 % — ABNORMAL LOW (ref 36.0–46.0)
Hemoglobin: 11.1 g/dL — ABNORMAL LOW (ref 12.0–15.0)
MCH: 29.8 pg (ref 26.0–34.0)
MCHC: 32.6 g/dL (ref 30.0–36.0)
MCV: 91.4 fL (ref 80.0–100.0)
Platelets: 275 10*3/uL (ref 150–400)
RBC: 3.73 MIL/uL — ABNORMAL LOW (ref 3.87–5.11)
RDW: 13.2 % (ref 11.5–15.5)
WBC: 5.8 10*3/uL (ref 4.0–10.5)
nRBC: 0 % (ref 0.0–0.2)

## 2022-08-21 LAB — TSH: TSH: 4.751 u[IU]/mL — ABNORMAL HIGH (ref 0.350–4.500)

## 2022-08-21 LAB — HEMOGLOBIN A1C
Hgb A1c MFr Bld: 6.3 % — ABNORMAL HIGH (ref 4.8–5.6)
Mean Plasma Glucose: 134.11 mg/dL

## 2022-08-21 LAB — HEPARIN LEVEL (UNFRACTIONATED)
Heparin Unfractionated: 0.12 IU/mL — ABNORMAL LOW (ref 0.30–0.70)
Heparin Unfractionated: <0.1 IU/mL — ABNORMAL LOW (ref 0.30–0.70)

## 2022-08-21 MED ORDER — SODIUM CHLORIDE 0.9% FLUSH: 10.0000 mL | INTRAVENOUS | Status: DC | PRN

## 2022-08-21 MED ORDER — SODIUM CHLORIDE 0.9% FLUSH
10.0000 mL | Freq: Two times a day (BID) | INTRAVENOUS | Status: DC
  Administered 2022-08-21 – 2022-08-25 (×9): 10 mL

## 2022-08-21 MED ORDER — MELATONIN 5 MG PO TABS
5.0000 mg | ORAL_TABLET | Freq: Every day | ORAL | Status: DC
  Administered 2022-08-21 – 2022-08-25 (×5): 5 mg via ORAL
  Filled 2022-08-21 (×5): qty 1

## 2022-08-21 MED ORDER — ASPIRIN 81 MG PO TBEC
81.0000 mg | DELAYED_RELEASE_TABLET | Freq: Every day | ORAL | Status: DC
  Administered 2022-08-21 – 2022-08-25 (×5): 81 mg via ORAL
  Filled 2022-08-21 (×5): qty 1

## 2022-08-21 MED ORDER — ATORVASTATIN CALCIUM 80 MG PO TABS
80.0000 mg | ORAL_TABLET | Freq: Every day | ORAL | Status: DC
  Administered 2022-08-21 – 2022-09-01 (×12): 80 mg via ORAL
  Filled 2022-08-21 (×12): qty 1

## 2022-08-21 MED ORDER — CHLORHEXIDINE GLUCONATE CLOTH 2 % EX PADS
6.0000 | MEDICATED_PAD | Freq: Every day | CUTANEOUS | Status: DC
  Administered 2022-08-21 – 2022-08-25 (×5): 6 via TOPICAL

## 2022-08-21 NOTE — Progress Notes (Signed)
CARDIAC REHAB PHASE I   [facility] pre-op education complete. Pt receptive to education on [facility] booklet, sternal precautions, IS, mobility importance, and discharge care planning. Pt demonstrated 1250 ml on IS, continued use encouraged. Pt practiced entering/exiting bed following sternal precautions and did well following verbal cues. All questions from pt answered, pt left in bed with call bell in reach.  [phone]  Jonna Coup, MS 08/21/2022 11:41 AM

## 2022-08-21 NOTE — Progress Notes (Signed)
Anesthesia Preop Eval I saw Lisa [name] today at the request of Dr. Maren Beach for post operative nausea and vomiting. She reports being "violently ill" after every surgery she's every had. Last operation she believes was ~15 years ago. Based on anesthetic records, her last operation was in 2014 and was lower back surgery. She received fairly standard prophylaxis (ondansetron, dexamethasone, midazolam) as well as metoclopramide but reports still vomiting post op.   I discussed the lack of data regarding PONV prophylaxis in cardiac surgery. My recommendation would be to consider aprepitant (Emend) 40 mg PO either the night before surgery or several hours prior on DOS, and/or to give 4mg  ondansetron IV 30 minutes prior to extubation in the ICU. Please let me or another anesthesia team member know if we can be of further assistance.

## 2022-08-21 NOTE — Progress Notes (Signed)
Peripherally Inserted Central Catheter Placement  The IV Nurse has discussed with the patient and/or persons authorized to consent for the patient, the purpose of this procedure and the potential benefits and risks involved with this procedure.  The benefits include less needle sticks, lab draws from the catheter, and the patient may be discharged home with the catheter. Risks include, but not limited to, infection, bleeding, blood clot (thrombus formation), and puncture of an artery; nerve damage and irregular heartbeat and possibility to perform a PICC exchange if needed/ordered by physician.  Alternatives to this procedure were also discussed.  Bard Power PICC patient education guide, fact sheet on infection prevention and patient information card has been provided to patient /or left at bedside.  PICC inserted by Curt Jews, RN.  PICC Placement Documentation  PICC Double Lumen 08/21/22 Right Basilic 38 cm 1 cm (Active)  Indication for Insertion or Continuance of Line Prolonged intravenous therapies;Limited venous access - need for IV therapy >5 days (PICC only) 08/21/22 1514  Exposed Catheter (cm) 1 cm 08/21/22 1514  Site Assessment Clean;Intact;Dry 08/21/22 1514  Lumen #1 Status Flushed;Saline locked;Blood return noted 08/21/22 1514  Lumen #2 Status Flushed;Saline locked;No blood return 08/21/22 1514  Dressing Type Transparent;Securing device 08/21/22 1514  Dressing Status Antimicrobial disc in place;Clean, Dry, Intact 08/21/22 1514  Safety Lock Not Applicable 08/21/22 1514  Line Care Connections checked and tightened 08/21/22 1514  Line Adjustment (NICU/IV Team Only) No 08/21/22 1514  Dressing Intervention New dressing 08/21/22 1514  Dressing Change Due 08/28/22 08/21/22 1514       Jennessa Trigo, Lajean Manes 08/21/2022, 3:15 PM

## 2022-08-21 NOTE — Progress Notes (Signed)
ANTICOAGULATION CONSULT NOTE   Pharmacy Consult for heparin  Indication: chest pain/ACS  Allergies  Allergen Reactions   Other Other (See Comments)    Anesthesia causes nausea and vomiting, memory difficulty and problems processing.   Oxycodone Nausea Only   Sulfa Antibiotics Rash   Sulfasalazine Rash    Patient Measurements: Heparin dosing weight= 70kg  Vital Signs: Temp: 98 F (36.7 C) (08/31 0125) Temp Source: Oral (08/31 0125) BP: 118/53 (08/31 0125) Pulse Rate: 60 (08/31 0125)  Labs: Recent Labs    08/19/22 0957 08/21/22 0301  HGB 11.7* 11.1*  HCT 35.9* 34.1*  PLT 281 275  HEPARINUNFRC  --  0.12*  CREATININE 1.07* 1.14*     Estimated Creatinine Clearance: 39.5 mL/min (A) (by C-G formula based on SCr of 1.14 mg/dL (H)).   Medical History: Past Medical History:  Diagnosis Date   Acne rosacea    Allergic rhinitis    Chronic kidney disease    kidney stones, Nov. 2013- tx with Lithotripsy, still has some present    Cough variant asthma    Degenerative disc disease    knees, ankles, back- OA   GERD (gastroesophageal reflux disease)    Headache(784.0)    since stopping aleve- 07/30/2013   Hyperlipemia    dyslipidemia   Hypertension    [facility] pharmacist follows pt. Riki Rusk Smart ,has never taken anti-hypertensive   Osteoarthritis    Plantar fasciitis    (using orthotics-they are wearing out)- Dr Luther Bradley   Pneumonia    double pneumonia - relative to sinus infection , had chemical cauterization    PONV (postoperative nausea and vomiting)    urgency, N&V for days, memory & processing   Sleep apnea    Bringing cpap mask and tubing, study last done- 2006    Medications:  Medications Prior to Admission  Medication Sig Dispense Refill Last Dose   acetaminophen (TYLENOL) 500 MG tablet Take 1,000 mg by mouth See admin instructions. 1000 mg in the morning and at night  Additional 1000 mg daily at mid day if needed.      alendronate (FOSAMAX) 70 MG  tablet Take 70 mg by mouth once a week. Take with a full glass of water on an empty stomach.  Saturday Morning   Past Week   amLODipine (NORVASC) 5 MG tablet Take 5 mg by mouth at bedtime.   08/19/2022   atorvastatin (LIPITOR) 40 MG tablet Take 1 tablet (40 mg total) by mouth at bedtime. 90 tablet 0 08/19/2022   Cholecalciferol (VITAMIN D-3) 125 MCG (5000 UT) TABS Take 5,000 Units by mouth daily.   08/19/2022   clopidogrel (PLAVIX) 75 MG tablet Take 1 tablet (75 mg total) by mouth daily. 90 tablet 0 08/20/2022 at 0530   Cobalamin Combinations (NEURIVA PLUS PO) Take 1 capsule by mouth daily.   08/19/2022   fenofibrate (TRICOR) 145 MG tablet Take 1 tablet by mouth daily, need MD visit for further refills (Patient taking differently: Take 145 mg by mouth at bedtime.) 90 tablet 0 08/19/2022   ferrous sulfate 324 MG TBEC Take 324 mg by mouth every other day.   08/19/2022   FIBER PO Take 1 tablet by mouth every morning. Fiber well gummy   08/19/2022   fluticasone (FLONASE) 50 MCG/ACT nasal spray Place 2 sprays into the nose daily. Allergic rhinitis.   08/20/2022 at 0530   hydrochlorothiazide (HYDRODIURIL) 12.5 MG tablet Take 12.5 mg by mouth daily.   08/19/2022   Magnesium 250 MG TABS  Take 250 mg by mouth at bedtime.   08/19/2022   Multiple Vitamin (MULTIVITAMIN WITH MINERALS) TABS Take 1 tablet by mouth daily. Centrum  Woman 50+   08/19/2022   Multiple Vitamins-Minerals (EMERGEN-C IMMUNE PO) Take 1,500 mg by mouth daily. 750 mg each gummy   08/19/2022   MYRBETRIQ 50 MG TB24 tablet Take 50 mg by mouth at bedtime.   08/19/2022   OVER THE COUNTER MEDICATION Take 1 tablet by mouth daily. Nervive      Polyethyl Glycol-Propyl Glycol (SYSTANE OP) Place 1 drop into both eyes daily as needed (Dry eyes).      TURMERIC PO Take 1,000 mg by mouth 2 (two) times daily.   08/19/2022   UNABLE TO FIND Take 1 tablet by mouth daily. GOLI Applecider Vinegar /Gummy   Past Month   Scheduled:   amLODipine  5 mg Oral QHS   aspirin EC   325 mg Oral Daily   atorvastatin  40 mg Oral QHS   fenofibrate  160 mg Oral Daily   ferrous sulfate  324 mg Oral QODAY   fluticasone  2 spray Each Nare Daily   metoprolol tartrate  25 mg Oral BID   mirabegron ER  50 mg Oral QHS   sodium chloride flush  3 mL Intravenous Q12H   sodium chloride flush  3 mL Intravenous Q12H    Assessment 81 yo female s/p cath with left main disease for possible CABG. Pharmacy to start heparin 8 hours after sheath removal (removed ~ 10:30am). He was not on anticoagulation at home.   Initial heparin level low: 0.12 no infusion issues or overt s/sx of bleeding per RN. CBC stable  Goal of Therapy:  Heparin level 0.3-0.7 units/ml Monitor platelets by anticoagulation protocol: Yes   Plan:  -Increase heparin gtt to 1200 units/hr  -heparin level in 8 hours and CBC in am  Ruben Im, PharmD Clinical Pharmacist 08/21/2022 4:15 AM Please check AMION for all [facility] Pharmacy numbers

## 2022-08-21 NOTE — Progress Notes (Addendum)
Rounding Note    Patient Name: Lisa [name], Dr. Date of Encounter: 08/21/2022  [place] Cardiologist: Kate Sable, MD   Subjective   Doing well this morning. No chest pain overnight.   Inpatient Medications    Scheduled Meds:  amLODipine  5 mg Oral QHS   aspirin EC  325 mg Oral Daily   atorvastatin  40 mg Oral QHS   fenofibrate  160 mg Oral Daily   ferrous sulfate  324 mg Oral QODAY   fluticasone  2 spray Each Nare Daily   metoprolol tartrate  25 mg Oral BID   mirabegron ER  50 mg Oral QHS   sodium chloride flush  3 mL Intravenous Q12H   sodium chloride flush  3 mL Intravenous Q12H   Continuous Infusions:  sodium chloride     heparin 1,200 Units/hr (08/21/22 0600)   PRN Meds: sodium chloride, acetaminophen, ondansetron (ZOFRAN) IV, sodium chloride flush   Vital Signs    Vitals:   08/20/22 1555 08/20/22 2110 08/20/22 2128 08/21/22 0125  BP: 136/60 (!) 144/73 (!) 144/73 (!) 118/53  Pulse: 70 69 64 60  Resp: 17 18  17   Temp: 98.2 F (36.8 C) 98 F (36.7 C)  98 F (36.7 C)  TempSrc: Oral Oral  Oral  SpO2: 97% 98%    Weight:      Height:        Intake/Output Summary (Last 24 hours) at 08/21/2022 0753 Last data filed at 08/21/2022 0600 Gross per 24 hour  Intake 965.34 ml  Output 300 ml  Net 665.34 ml      08/20/2022    2:29 PM 08/19/2022    8:24 AM 08/08/2022    1:44 PM  Last 3 Weights  Weight (lbs) 176 lb 12.6 oz 176 lb 12.8 oz 177 lb 2 oz  Weight (kg) 80.19 kg 80.196 kg 80.343 kg      Telemetry    Sinus Rhythm/bradycardia - Personally Reviewed  ECG    No new tracing  Physical Exam   GEN: No acute distress.   Neck: No JVD Cardiac: RRR, soft systolic murmur, no rubs, or gallops.  Respiratory: Clear to auscultation bilaterally. GI: Soft, nontender, non-distended  MS: No edema; No deformity. Right radial cath site stable.  Neuro:  Nonfocal  Psych: Normal affect   Labs    High Sensitivity Troponin:  No results for  input(s): "TROPONINIHS" in the last 720 hours.   Chemistry Recent Labs  Lab 08/19/22 0957 08/21/22 0301  NA 141 143  K 3.9 4.3  CL 109 111  CO2 25 23  GLUCOSE 114* 112*  BUN 27* 24*  CREATININE 1.07* 1.14*  CALCIUM 9.9 8.9  PROT 7.5  --   ALBUMIN 4.1 3.4*  AST 20  --   ALT 19  --   ALKPHOS 66  --   BILITOT 0.4  --   GFRNONAA 53* 49*  ANIONGAP 7 9    Lipids  Recent Labs  Lab 08/19/22 0957  CHOL 171  TRIG 160*  HDL 67  LDLCALC 72  CHOLHDL 2.6    Hematology Recent Labs  Lab 08/19/22 0957 08/21/22 0301  WBC 5.2 5.8  RBC 3.96 3.73*  HGB 11.7* 11.1*  HCT 35.9* 34.1*  MCV 90.7 91.4  MCH 29.5 29.8  MCHC 32.6 32.6  RDW 12.9 13.2  PLT 281 275   Thyroid  Recent Labs  Lab 08/21/22 0301  TSH 4.751*    BNPNo results for input(s): "BNP", "  PROBNP" in the last 168 hours.  DDimer No results for input(s): "DDIMER" in the last 168 hours.   Radiology    ECHOCARDIOGRAM COMPLETE  Result Date: 08/20/2022    ECHOCARDIOGRAM REPORT   Patient Name:   Lisa [name] Date of Exam: 08/20/2022 Medical Rec #:  [id number]        Height:       63.0 in Accession #:    [id number]       Weight:       176.8 lb Date of Birth:  Jun 13, 1941        BSA:          1.835 m Patient Age:    81 years         BP:           130/56 mmHg Patient Gender: F                HR:           66 bpm. Exam Location:  Inpatient Procedure: 2D Echo, Color Doppler and Cardiac Doppler Indications:    R07.9* Chest pain, unspecified  History:        Patient has prior history of Echocardiogram examinations, most                 recent 11/07/2020. Risk Factors:Hypertension, Dyslipidemia and                 Sleep Apnea.  Sonographer:    Irving Burton Senior RDCS Referring Phys: [id number] MUHAMMAD A ARIDA IMPRESSIONS  1. Left ventricular ejection fraction, by estimation, is 60 to 65%. The left ventricle has normal function. The left ventricle has no regional wall motion abnormalities. Left ventricular diastolic parameters are consistent with  Grade I diastolic dysfunction (impaired relaxation).  2. Right ventricular systolic function is normal. The right ventricular size is normal. There is normal pulmonary artery systolic pressure. The estimated right ventricular systolic pressure is 27.2 mmHg.  3. The mitral valve is normal in structure. Mild mitral valve regurgitation. No evidence of mitral stenosis. Moderate mitral annular calcification.  4. The aortic valve is tricuspid. There is mild calcification of the aortic valve. There is mild thickening of the aortic valve. Aortic valve regurgitation is not visualized. Aortic valve sclerosis/calcification is present, without any evidence of aortic stenosis.  5. The inferior vena cava is normal in size with greater than 50% respiratory variability, suggesting right atrial pressure of 3 mmHg. Comparison(s): No significant change from prior study. Prior images reviewed side by side. FINDINGS  Left Ventricle: Left ventricular ejection fraction, by estimation, is 60 to 65%. The left ventricle has normal function. The left ventricle has no regional wall motion abnormalities. The left ventricular internal cavity size was normal in size. There is  no left ventricular hypertrophy. Left ventricular diastolic parameters are consistent with Grade I diastolic dysfunction (impaired relaxation). Indeterminate filling pressures. Right Ventricle: The right ventricular size is normal. No increase in right ventricular wall thickness. Right ventricular systolic function is normal. There is normal pulmonary artery systolic pressure. The tricuspid regurgitant velocity is 2.46 m/s, and  with an assumed right atrial pressure of 3 mmHg, the estimated right ventricular systolic pressure is 27.2 mmHg. Left Atrium: Left atrial size was normal in size. Right Atrium: Right atrial size was normal in size. Pericardium: There is no evidence of pericardial effusion. Mitral Valve: The mitral valve is normal in structure. Moderate mitral  annular calcification. Mild mitral valve regurgitation. No evidence of mitral valve  stenosis. Tricuspid Valve: The tricuspid valve is normal in structure. Tricuspid valve regurgitation is not demonstrated. No evidence of tricuspid stenosis. Aortic Valve: The aortic valve is tricuspid. There is mild calcification of the aortic valve. There is mild thickening of the aortic valve. Aortic valve regurgitation is not visualized. Aortic valve sclerosis/calcification is present, without any evidence of aortic stenosis. Pulmonic Valve: The pulmonic valve was normal in structure. Pulmonic valve regurgitation is not visualized. No evidence of pulmonic stenosis. Aorta: The aortic root is normal in size and structure. Venous: The inferior vena cava is normal in size with greater than 50% respiratory variability, suggesting right atrial pressure of 3 mmHg. IAS/Shunts: No atrial level shunt detected by color flow Doppler.  LEFT VENTRICLE PLAX 2D LVIDd:         5.10 cm   Diastology LVIDs:         3.60 cm   LV e' medial:    5.00 cm/s LV PW:         1.10 cm   LV E/e' medial:  12.4 LV IVS:        0.90 cm   LV e' lateral:   6.85 cm/s LVOT diam:     2.00 cm   LV E/e' lateral: 9.1 LV SV:         56 LV SV Index:   30 LVOT Area:     3.14 cm  RIGHT VENTRICLE RV S prime:     13.90 cm/s TAPSE (M-mode): 2.0 cm LEFT ATRIUM             Index        RIGHT ATRIUM           Index LA diam:        3.60 cm 1.96 cm/m   RA Area:     17.20 cm LA Vol (A2C):   34.1 ml 18.58 ml/m  RA Volume:   44.50 ml  24.25 ml/m LA Vol (A4C):   34.4 ml 18.75 ml/m LA Biplane Vol: 36.3 ml 19.78 ml/m  AORTIC VALVE LVOT Vmax:   66.60 cm/s LVOT Vmean:  53.800 cm/s LVOT VTI:    0.178 m  AORTA Ao Root diam: 2.90 cm Ao Asc diam:  2.90 cm MITRAL VALVE                TRICUSPID VALVE MV Area (PHT): 3.34 cm     TR Peak grad:   24.2 mmHg MV Decel Time: 227 msec     TR Vmax:        246.00 cm/s MV E velocity: 62.10 cm/s MV A velocity: 109.00 cm/s  SHUNTS MV E/A ratio:  0.57          Systemic VTI:  0.18 m                             Systemic Diam: 2.00 cm Dani Gobble Croitoru MD Electronically signed by Sanda Klein MD Signature Date/Time: 08/20/2022/3:38:15 PM    Final    CARDIAC CATHETERIZATION  Result Date: 08/20/2022   Mid LM to Ost LAD lesion is 90% stenosed.   Ost Cx to Prox Cx lesion is 99% stenosed.   Prox LAD to Mid LAD lesion is 30% stenosed.   Prox RCA lesion is 85% stenosed.   The left ventricular systolic function is normal.   LV end diastolic pressure is mildly elevated.   The left ventricular ejection fraction is 55-65% by visual estimate. 1.  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  The coronary arteries are heavily calcified. 2.  Normal LV systolic function mildly elevated left ventricular end-diastolic pressure.  No significant gradient across aortic valve. Recommendations: Recommend CABG for revascularization.  Given the patient's symptoms at rest, recommend admission to the hospital and initiation of heparin drip.  Unfortunately, the patient is on clopidogrel therapy as an outpatient and this was discontinued today.  CABG can likely be performed early next week.  The coronary anatomy is not favorable for PCI.   Cardiac Studies   Cath: 08/20/22    Mid LM to Ost LAD lesion is 90% stenosed.   Ost Cx to Prox Cx lesion is 99% stenosed.   Prox LAD to Mid LAD lesion is 30% stenosed.   Prox RCA lesion is 85% stenosed.   The left ventricular systolic function is normal.   LV end diastolic pressure is mildly elevated.   The left ventricular ejection fraction is 55-65% by visual estimate.   1.  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  The coronary arteries are heavily calcified. 2.   Normal LV systolic function mildly elevated left ventricular end-diastolic pressure.  No significant gradient across aortic valve.   Recommendations: Recommend CABG for revascularization.  Given the patient's symptoms at rest, recommend admission to the hospital and initiation of heparin drip.  Unfortunately, the patient is on clopidogrel therapy as an outpatient and this was discontinued today.  CABG can likely be performed early next week.  The coronary anatomy is not favorable for PCI.  Diagnostic Dominance: Co-dominant  Echo: 08/20/22  IMPRESSIONS     1. Left ventricular ejection fraction, by estimation, is 60 to 65%. The  left ventricle has normal function. The left ventricle has no regional  wall motion abnormalities. Left ventricular diastolic parameters are  consistent with Grade I diastolic  dysfunction (impaired relaxation).   2. Right ventricular systolic function is normal. The right ventricular  size is normal. There is normal pulmonary artery systolic pressure. The  estimated right ventricular systolic pressure is 27.2 mmHg.   3. The mitral valve is normal in structure. Mild mitral valve  regurgitation. No evidence of mitral stenosis. Moderate mitral annular  calcification.   4. The aortic valve is tricuspid. There is mild calcification of the  aortic valve. There is mild thickening of the aortic valve. Aortic valve  regurgitation is not visualized. Aortic valve sclerosis/calcification is  present, without any evidence of  aortic stenosis.   5. The inferior vena cava is normal in size with greater than 50%  respiratory variability, suggesting right atrial pressure of 3 mmHg.   Comparison(s): No significant change from prior study. Prior images  reviewed side by side.   Patient Profile     81 y.o. female with past medical history of CAD, stroke, CKD stage II, hypertension, hyperlipidemia and GERD who was recently seen in the office for complaints of exertional dyspnea  and chest discomfort.  Underwent outpatient coronary CTA which was abnormal and set up for outpatient cardiac catheterization.  Assessment & Plan    CAD Dyspnea on exertion -- Underwent outpatient cardiac catheterization noted above with codominant coronary arteries with severe distal left main bifurcation disease into the ostium of the LAD and left circumflex of  90 and 99%.  Also with 85% proximal RCA lesion.  Recommendations for TCTS evaluation for CABG though she needs to undergo washout of Plavix.  Tentatively planned for CABG on 9/5 with Dr. Darcey Nora.  LVEF 60 to [id number], grade 1 diastolic dysfunction, normal RV size and function with normal pulmonary artery pressure. -- Reduce aspirin to 81 mg, increase atorvastatin to 80 mg daily, continue fenofibrate, metoprolol and Norvasc  Hypertension -- Overall stable -- Continue metoprolol 25 mg twice daily and Norvasc 5 mg daily  Hyperlipidemia -- LDL 72, HDL 61, triglycerides 160 -- Increased atorvastatin to 80 mg daily -- We will need LFT/FLP in 8 weeks  CKD stage II -- Creatinine 1.3>> 1.0>> 1.1 -- Follow BMET  Mild mitral regurgitation -- Noted on echo 8/30  For questions or updates, please contact [place] Please consult [email or web address] for contact info under        Signed, Reino Bellis, NP  08/21/2022, 7:53 AM     ATTENDING ATTESTATION:  After conducting a review of all available clinical information with the care team, interviewing the patient, and performing a physical exam, I agree with the findings and plan described in this note.   GEN: No acute distress.   HEENT:  MMM, no JVD, no scleral icterus Cardiac: RRR, no murmurs, rubs, or gallops.  Respiratory: Clear to auscultation bilaterally. GI: Soft, nontender, non-distended  MS: No edema; No deformity. Neuro:  Nonfocal  Vasc:  TR band R radial site  Patient doing well and remains chest pain and dyspnea free.  She is on a heparin drip.  We are awaiting  Plavix washout.  She had been on Plavix chronically due to a stroke sustained in March 2022.  She is a difficult blood draw in I will place an order for a PICC line placement.  Otherwise continue current therapy.  Lenna Sciara, MD Pager [phone]

## 2022-08-21 NOTE — Progress Notes (Signed)
ANTICOAGULATION CONSULT NOTE   Pharmacy Consult for heparin  Indication: chest pain/ACS  Allergies  Allergen Reactions   Other Other (See Comments)    Anesthesia causes nausea and vomiting, memory difficulty and problems processing.   Oxycodone Nausea Only   Sulfa Antibiotics Rash   Sulfasalazine Rash    Patient Measurements: Heparin dosing weight= 70kg  Vital Signs: Temp: 98 F (36.7 C) (08/31 0830) Temp Source: Oral (08/31 0830) BP: 122/57 (08/31 0830) Pulse Rate: 67 (08/31 0830)  Labs: Recent Labs    08/19/22 0957 08/21/22 0301 08/21/22 1240  HGB 11.7* 11.1*  --   HCT 35.9* 34.1*  --   PLT 281 275  --   HEPARINUNFRC  --  0.12* <0.10*  CREATININE 1.07* 1.14*  --      Estimated Creatinine Clearance: 39.5 mL/min (A) (by C-G formula based on SCr of 1.14 mg/dL (H)).   Medical History: Past Medical History:  Diagnosis Date   Acne rosacea    Allergic rhinitis    Chronic kidney disease    kidney stones, Nov. 2013- tx with Lithotripsy, still has some present    Cough variant asthma    Degenerative disc disease    knees, ankles, back- OA   GERD (gastroesophageal reflux disease)    Headache(784.0)    since stopping aleve- 07/30/2013   Hyperlipemia    dyslipidemia   Hypertension    [facility] follows pt. Riki Rusk Smart ,has never taken anti-hypertensive   Osteoarthritis    Plantar fasciitis    (using orthotics-they are wearing out)- Dr Luther Bradley   Pneumonia    double pneumonia - relative to sinus infection , had chemical cauterization    PONV (postoperative nausea and vomiting)    urgency, N&V for days, memory & processing   Sleep apnea    Bringing cpap mask and tubing, study last done- 2006    Medications:  Medications Prior to Admission  Medication Sig Dispense Refill Last Dose   acetaminophen (TYLENOL) 500 MG tablet Take 1,000 mg by mouth See admin instructions. 1000 mg in the morning and at night  Additional 1000 mg daily at mid day if  needed.      alendronate (FOSAMAX) 70 MG tablet Take 70 mg by mouth once a week. Take with a full glass of water on an empty stomach.  Saturday Morning   Past Week   amLODipine (NORVASC) 5 MG tablet Take 5 mg by mouth at bedtime.   08/19/2022   atorvastatin (LIPITOR) 40 MG tablet Take 1 tablet (40 mg total) by mouth at bedtime. 90 tablet 0 08/19/2022   Cholecalciferol (VITAMIN D-3) 125 MCG (5000 UT) TABS Take 5,000 Units by mouth daily.   08/19/2022   clopidogrel (PLAVIX) 75 MG tablet Take 1 tablet (75 mg total) by mouth daily. 90 tablet 0 08/20/2022 at 0530   Cobalamin Combinations (NEURIVA PLUS PO) Take 1 capsule by mouth daily.   08/19/2022   fenofibrate (TRICOR) 145 MG tablet Take 1 tablet by mouth daily, need MD visit for further refills (Patient taking differently: Take 145 mg by mouth at bedtime.) 90 tablet 0 08/19/2022   ferrous sulfate 324 MG TBEC Take 324 mg by mouth every other day.   08/19/2022   FIBER PO Take 1 tablet by mouth every morning. Fiber well gummy   08/19/2022   fluticasone (FLONASE) 50 MCG/ACT nasal spray Place 2 sprays into the nose daily. Allergic rhinitis.   08/20/2022 at 0530   hydrochlorothiazide (HYDRODIURIL) 12.5 MG tablet  Take 12.5 mg by mouth daily.   08/19/2022   Magnesium 250 MG TABS Take 250 mg by mouth at bedtime.   08/19/2022   Multiple Vitamin (MULTIVITAMIN WITH MINERALS) TABS Take 1 tablet by mouth daily. Centrum  Woman 50+   08/19/2022   Multiple Vitamins-Minerals (EMERGEN-C IMMUNE PO) Take 1,500 mg by mouth daily. 750 mg each gummy   08/19/2022   MYRBETRIQ 50 MG TB24 tablet Take 50 mg by mouth at bedtime.   08/19/2022   OVER THE COUNTER MEDICATION Take 1 tablet by mouth daily. Nervive      Polyethyl Glycol-Propyl Glycol (SYSTANE OP) Place 1 drop into both eyes daily as needed (Dry eyes).      TURMERIC PO Take 1,000 mg by mouth 2 (two) times daily.   08/19/2022   UNABLE TO FIND Take 1 tablet by mouth daily. GOLI Applecider Vinegar /Gummy   Past Month   Scheduled:    amLODipine  5 mg Oral QHS   aspirin EC  81 mg Oral Daily   atorvastatin  80 mg Oral QHS   Chlorhexidine Gluconate Cloth  6 each Topical Daily   fenofibrate  160 mg Oral Daily   ferrous sulfate  324 mg Oral QODAY   fluticasone  2 spray Each Nare Daily   metoprolol tartrate  25 mg Oral BID   mirabegron ER  50 mg Oral QHS   sodium chloride flush  10-40 mL Intracatheter Q12H    Assessment 81 yo female s/p cath with left main disease for possible CABG. Pharmacy to start heparin 8 hours after sheath removal (removed ~ 10:30am). He was not on anticoagulation at home.   Patient with CAD awaiting CABG - needs clopidogrel washout - last dose 8/29 Heparin drip 1200 uts/hr heparin level < 0.1 < goal  Cbc stable no bleeding   Goal of Therapy:  Heparin level 0.3-0.7 units/ml Monitor platelets by anticoagulation protocol: Yes   Plan:  -Increase heparin gtt to 1400 units/hr  -heparin level and CBC in am    Leota Sauers Pharm.D. CPP, BCPS Clinical Pharmacist [phone] 08/21/2022 3:44 PM   Please check AMION for all [facility] Pharmacy numbers

## 2022-08-22 ENCOUNTER — Inpatient Hospital Stay (HOSPITAL_COMMUNITY): Payer: Medicare Other

## 2022-08-22 DIAGNOSIS — Z01810 Encounter for preprocedural cardiovascular examination: Secondary | ICD-10-CM

## 2022-08-22 DIAGNOSIS — I6523 Occlusion and stenosis of bilateral carotid arteries: Secondary | ICD-10-CM

## 2022-08-22 DIAGNOSIS — I2510 Atherosclerotic heart disease of native coronary artery without angina pectoris: Secondary | ICD-10-CM

## 2022-08-22 DIAGNOSIS — I200 Unstable angina: Secondary | ICD-10-CM | POA: Diagnosis not present

## 2022-08-22 LAB — CBC
HCT: 31.7 % — ABNORMAL LOW (ref 36.0–46.0)
Hemoglobin: 10.4 g/dL — ABNORMAL LOW (ref 12.0–15.0)
MCH: 30 pg (ref 26.0–34.0)
MCHC: 32.8 g/dL (ref 30.0–36.0)
MCV: 91.4 fL (ref 80.0–100.0)
Platelets: 236 10*3/uL (ref 150–400)
RBC: 3.47 MIL/uL — ABNORMAL LOW (ref 3.87–5.11)
RDW: 13.1 % (ref 11.5–15.5)
WBC: 5.7 10*3/uL (ref 4.0–10.5)
nRBC: 0 % (ref 0.0–0.2)

## 2022-08-22 LAB — URINALYSIS, ROUTINE W REFLEX MICROSCOPIC
Bacteria, UA: NONE SEEN
Bilirubin Urine: NEGATIVE
Glucose, UA: NEGATIVE mg/dL
Hgb urine dipstick: NEGATIVE
Ketones, ur: NEGATIVE mg/dL
Nitrite: NEGATIVE
Protein, ur: NEGATIVE mg/dL
Specific Gravity, Urine: 1.015 (ref 1.005–1.030)
pH: 5 (ref 5.0–8.0)

## 2022-08-22 LAB — BASIC METABOLIC PANEL
Anion gap: 6 (ref 5–15)
BUN: 27 mg/dL — ABNORMAL HIGH (ref 8–23)
CO2: 24 mmol/L (ref 22–32)
Calcium: 8.7 mg/dL — ABNORMAL LOW (ref 8.9–10.3)
Chloride: 109 mmol/L (ref 98–111)
Creatinine, Ser: 1.2 mg/dL — ABNORMAL HIGH (ref 0.44–1.00)
GFR, Estimated: 46 mL/min — ABNORMAL LOW (ref >60)
Glucose, Bld: 120 mg/dL — ABNORMAL HIGH (ref 70–99)
Potassium: 3.9 mmol/L (ref 3.5–5.1)
Sodium: 139 mmol/L (ref 135–145)

## 2022-08-22 LAB — SURGICAL PCR SCREEN
MRSA, PCR: NEGATIVE
Staphylococcus aureus: POSITIVE — AB

## 2022-08-22 LAB — HEPARIN LEVEL (UNFRACTIONATED): Heparin Unfractionated: 0.67 IU/mL (ref 0.30–0.70)

## 2022-08-22 LAB — LIPOPROTEIN A (LPA): Lipoprotein (a): 220.2 nmol/L — ABNORMAL HIGH (ref <75.0)

## 2022-08-22 MED ORDER — NITROGLYCERIN IN D5W 200-5 MCG/ML-% IV SOLN
2.0000 ug/min | INTRAVENOUS | Status: AC
  Administered 2022-08-26: 16.6 ug/min via INTRAVENOUS
  Filled 2022-08-22: qty 250

## 2022-08-22 MED ORDER — DEXMEDETOMIDINE HCL IN NACL 400 MCG/100ML IV SOLN
0.1000 ug/kg/h | INTRAVENOUS | Status: AC
  Administered 2022-08-26: .5 ug/kg/h via INTRAVENOUS
  Filled 2022-08-22: qty 100

## 2022-08-22 MED ORDER — DOCUSATE SODIUM 100 MG PO CAPS
100.0000 mg | ORAL_CAPSULE | Freq: Every day | ORAL | Status: DC
  Administered 2022-08-22 – 2022-08-25 (×4): 100 mg via ORAL
  Filled 2022-08-22 (×4): qty 1

## 2022-08-22 MED ORDER — POLYETHYLENE GLYCOL 3350 17 G PO PACK
17.0000 g | PACK | Freq: Every day | ORAL | Status: DC
  Administered 2022-08-22 – 2022-08-23 (×2): 17 g via ORAL
  Filled 2022-08-22 (×3): qty 1

## 2022-08-22 MED ORDER — INSULIN REGULAR(HUMAN) IN NACL 100-0.9 UT/100ML-% IV SOLN
INTRAVENOUS | Status: AC
  Administered 2022-08-26: .9 [IU]/h via INTRAVENOUS
  Filled 2022-08-22: qty 100

## 2022-08-22 MED ORDER — EPINEPHRINE HCL 5 MG/250ML IV SOLN IN NS
0.0000 ug/min | INTRAVENOUS | Status: DC
  Filled 2022-08-22: qty 250

## 2022-08-22 MED ORDER — POTASSIUM CHLORIDE 2 MEQ/ML IV SOLN
80.0000 meq | INTRAVENOUS | Status: DC
  Filled 2022-08-22: qty 40

## 2022-08-22 MED ORDER — MILRINONE LACTATE IN DEXTROSE 20-5 MG/100ML-% IV SOLN
0.3000 ug/kg/min | INTRAVENOUS | Status: AC
  Administered 2022-08-26: .25 ug/kg/min via INTRAVENOUS
  Filled 2022-08-22: qty 100

## 2022-08-22 MED ORDER — MUPIROCIN 2 % EX OINT
1.0000 | TOPICAL_OINTMENT | Freq: Two times a day (BID) | CUTANEOUS | Status: DC
  Administered 2022-08-22 – 2022-08-25 (×8): 1 via NASAL
  Filled 2022-08-22 (×3): qty 22

## 2022-08-22 MED ORDER — PHENYLEPHRINE HCL-NACL 20-0.9 MG/250ML-% IV SOLN
30.0000 ug/min | INTRAVENOUS | Status: AC
  Administered 2022-08-26: 25 ug/min via INTRAVENOUS
  Filled 2022-08-22: qty 250

## 2022-08-22 MED ORDER — TRANEXAMIC ACID (OHS) PUMP PRIME SOLUTION
2.0000 mg/kg | INTRAVENOUS | Status: DC
  Filled 2022-08-22: qty 1.6

## 2022-08-22 MED ORDER — VANCOMYCIN HCL 1250 MG/250ML IV SOLN
1250.0000 mg | INTRAVENOUS | Status: AC
  Administered 2022-08-26: 1250 mg via INTRAVENOUS
  Filled 2022-08-22: qty 250

## 2022-08-22 MED ORDER — HEPARIN 30,000 UNITS/1000 ML (OHS) CELLSAVER SOLUTION
Status: DC
  Filled 2022-08-22: qty 1000

## 2022-08-22 MED ORDER — MANNITOL 20 % IV SOLN
INTRAVENOUS | Status: DC
  Filled 2022-08-22: qty 13

## 2022-08-22 MED ORDER — PLASMA-LYTE A IV SOLN
INTRAVENOUS | Status: DC
  Filled 2022-08-22: qty 2.5

## 2022-08-22 MED ORDER — TRANEXAMIC ACID (OHS) BOLUS VIA INFUSION
15.0000 mg/kg | INTRAVENOUS | Status: AC
  Administered 2022-08-26: 952.5 mg via INTRAVENOUS
  Filled 2022-08-22: qty 953

## 2022-08-22 MED ORDER — CEFAZOLIN SODIUM-DEXTROSE 2-4 GM/100ML-% IV SOLN
2.0000 g | INTRAVENOUS | Status: AC
  Administered 2022-08-26: 2 g via INTRAVENOUS
  Filled 2022-08-22: qty 100

## 2022-08-22 MED ORDER — TRANEXAMIC ACID 1000 MG/10ML IV SOLN
1.5000 mg/kg/h | INTRAVENOUS | Status: AC
  Administered 2022-08-26: 1.5 mg/kg/h via INTRAVENOUS
  Filled 2022-08-22: qty 25

## 2022-08-22 MED ORDER — NOREPINEPHRINE 4 MG/250ML-% IV SOLN
0.0000 ug/min | INTRAVENOUS | Status: DC
  Filled 2022-08-22: qty 250

## 2022-08-22 NOTE — Progress Notes (Signed)
Reviewed preop ed. Pt practicing IS, achieving 1250 ml currently. Holding ambulation. All questions answered. She is investigating options at d/c. Will s/o till after surgery. [phone] Lisa [name] BS, ACSM-CEP 08/22/2022 10:29 AM

## 2022-08-22 NOTE — Progress Notes (Signed)
2 Days Post-Op Procedure(s) (LRB): LEFT HEART CATH AND CORONARY ANGIOGRAPHY (N/A) Subjective:  Patient with minimal symptoms on IV heparin Pre-CABG for showed no significant carotid disease or peripheral vascular disease Multivessel CABG planned for Tuesday a.m. September 5 using left IMA and saphenous vein.  Objective: Vital signs in last 24 hours: Temp:  [97.7 F (36.5 C)-98.2 F (36.8 C)] 97.8 F (36.6 C) (09/01 0925) Pulse Rate:  [51-60] 60 (09/01 0925) Cardiac Rhythm: Sinus bradycardia;Normal sinus rhythm (09/01 0925) Resp:  [16] 16 (09/01 0925) BP: (111-127)/(53-61) 127/53 (09/01 0925) SpO2:  [99 %] 99 % (09/01 0925)  Hemodynamic parameters for last 24 hours:  Sinus rhythm  Intake/Output from previous day: 08/31 0701 - 09/01 0700 In: 311 [I.V.:311] Out: -  Intake/Output this shift: No intake/output data recorded.  Exam Alert and comfortable Is clear No bleeding from cath site No intact  Lab Results: Recent Labs    08/21/22 0301 08/22/22 0527  WBC 5.8 5.7  HGB 11.1* 10.4*  HCT 34.1* 31.7*  PLT 275 236   BMET:  Recent Labs    08/21/22 0301 08/22/22 0527  NA 143 139  K 4.3 3.9  CL 111 109  CO2 23 24  GLUCOSE 112* 120*  BUN 24* 27*  CREATININE 1.14* 1.20*  CALCIUM 8.9 8.7*    PT/INR: No results for input(s): "LABPROT", "INR" in the last 72 hours. ABG No results found for: "PHART", "HCO3", "TCO2", "ACIDBASEDEF", "O2SAT" CBG (last 3)  No results for input(s): "GLUCAP" in the last 72 hours.  Assessment/Plan: S/P Procedure(s) (LRB): LEFT HEART CATH AND CORONARY ANGIOGRAPHY (N/A) Plan multivessel CABG Tuesday a.m. September 5.   LOS: 2 days    Lovett Sox 08/22/2022

## 2022-08-22 NOTE — Progress Notes (Signed)
Pre-CABG Dopplers completed. Refer to "CV Proc" under chart review to view preliminary results.  08/22/2022 2:36 PM Eula Fried., MHA, RVT, RDCS, RDMS

## 2022-08-22 NOTE — Progress Notes (Signed)
ANTICOAGULATION CONSULT NOTE   Pharmacy Consult for heparin  Indication: chest pain/ACS  Allergies  Allergen Reactions   Other Other (See Comments)    Anesthesia causes nausea and vomiting, memory difficulty and problems processing.   Oxycodone Nausea Only   Sulfa Antibiotics Rash   Sulfasalazine Rash    Patient Measurements: Heparin dosing weight= 70kg  Vital Signs: Temp: 97.7 F (36.5 C) (09/01 0345) Temp Source: Oral (09/01 0345) BP: 111/57 (09/01 0345) Pulse Rate: 51 (09/01 0345)  Labs: Recent Labs    08/19/22 0957 08/21/22 0301 08/21/22 1240 08/22/22 0527  HGB 11.7* 11.1*  --  10.4*  HCT 35.9* 34.1*  --  31.7*  PLT 281 275  --  236  HEPARINUNFRC  --  0.12* <0.10* 0.67  CREATININE 1.07* 1.14*  --  1.20*     Estimated Creatinine Clearance: 37.5 mL/min (A) (by C-G formula based on SCr of 1.2 mg/dL (H)).   Medical History: Past Medical History:  Diagnosis Date   Acne rosacea    Allergic rhinitis    Chronic kidney disease    kidney stones, Nov. 2013- tx with Lithotripsy, still has some present    Cough variant asthma    Degenerative disc disease    knees, ankles, back- OA   GERD (gastroesophageal reflux disease)    Headache(784.0)    since stopping aleve- 07/30/2013   Hyperlipemia    dyslipidemia   Hypertension    [facility] cardiac pharmacist follows pt. Riki Rusk Smart ,has never taken anti-hypertensive   Osteoarthritis    Plantar fasciitis    (using orthotics-they are wearing out)- Dr Luther Bradley   Pneumonia    double pneumonia - relative to sinus infection , had chemical cauterization    PONV (postoperative nausea and vomiting)    urgency, N&V for days, memory & processing   Sleep apnea    Bringing cpap mask and tubing, study last done- 2006    Medications:  Medications Prior to Admission  Medication Sig Dispense Refill Last Dose   acetaminophen (TYLENOL) 500 MG tablet Take 1,000 mg by mouth See admin instructions. 1000 mg in the morning and at  night  Additional 1000 mg daily at mid day if needed.      alendronate (FOSAMAX) 70 MG tablet Take 70 mg by mouth once a week. Take with a full glass of water on an empty stomach.  Saturday Morning   Past Week   amLODipine (NORVASC) 5 MG tablet Take 5 mg by mouth at bedtime.   08/19/2022   atorvastatin (LIPITOR) 40 MG tablet Take 1 tablet (40 mg total) by mouth at bedtime. 90 tablet 0 08/19/2022   Cholecalciferol (VITAMIN D-3) 125 MCG (5000 UT) TABS Take 5,000 Units by mouth daily.   08/19/2022   clopidogrel (PLAVIX) 75 MG tablet Take 1 tablet (75 mg total) by mouth daily. 90 tablet 0 08/20/2022 at 0530   Cobalamin Combinations (NEURIVA PLUS PO) Take 1 capsule by mouth daily.   08/19/2022   fenofibrate (TRICOR) 145 MG tablet Take 1 tablet by mouth daily, need MD visit for further refills (Patient taking differently: Take 145 mg by mouth at bedtime.) 90 tablet 0 08/19/2022   ferrous sulfate 324 MG TBEC Take 324 mg by mouth every other day.   08/19/2022   FIBER PO Take 1 tablet by mouth every morning. Fiber well gummy   08/19/2022   fluticasone (FLONASE) 50 MCG/ACT nasal spray Place 2 sprays into the nose daily. Allergic rhinitis.   08/20/2022 at 0530  hydrochlorothiazide (HYDRODIURIL) 12.5 MG tablet Take 12.5 mg by mouth daily.   08/19/2022   Magnesium 250 MG TABS Take 250 mg by mouth at bedtime.   08/19/2022   Multiple Vitamin (MULTIVITAMIN WITH MINERALS) TABS Take 1 tablet by mouth daily. Centrum  Woman 50+   08/19/2022   Multiple Vitamins-Minerals (EMERGEN-C IMMUNE PO) Take 1,500 mg by mouth daily. 750 mg each gummy   08/19/2022   MYRBETRIQ 50 MG TB24 tablet Take 50 mg by mouth at bedtime.   08/19/2022   OVER THE COUNTER MEDICATION Take 1 tablet by mouth daily. Nervive      Polyethyl Glycol-Propyl Glycol (SYSTANE OP) Place 1 drop into both eyes daily as needed (Dry eyes).      TURMERIC PO Take 1,000 mg by mouth 2 (two) times daily.   08/19/2022   UNABLE TO FIND Take 1 tablet by mouth daily. GOLI Applec[employer]  Vinegar /Gummy   Past Month   Scheduled:   amLODipine  5 mg Oral QHS   aspirin EC  81 mg Oral Daily   atorvastatin  80 mg Oral QHS   Chlorhexidine Gluconate Cloth  6 each Topical Daily   fenofibrate  160 mg Oral Daily   ferrous sulfate  324 mg Oral QODAY   fluticasone  2 spray Each Nare Daily   melatonin  5 mg Oral QHS   metoprolol tartrate  25 mg Oral BID   mirabegron ER  50 mg Oral QHS   sodium chloride flush  10-40 mL Intracatheter Q12H    Assessment 81 yo female s/p cath with left main disease for possible CABG. Pharmacy to start heparin 8 hours after sheath removal (removed ~ 10:30am). He was not on anticoagulation at home.   Patient with CAD awaiting CABG - needs clopidogrel washout - last dose 8/29 Heparin drip 1400 uts/hr heparin level 0.67 this am at goal but gig jump with increased heparin drip rate yesterday - will decrease slightly  Cbc stable no bleeding   Goal of Therapy:  Heparin level 0.3-0.7 units/ml Monitor platelets by anticoagulation protocol: Yes   Plan:  - decrease heparin gtt to 1300 units/hr  -heparin level and CBC in am    Leota Sauers Pharm.D. CPP, BCPS Clinical Pharmacist [phone] 08/22/2022 7:22 AM   Please check AMION for all [facility] Pharmacy numbers

## 2022-08-22 NOTE — Progress Notes (Addendum)
Rounding Note    Patient Name: Lisa [name], Dr. Date of Encounter: 08/22/2022  [facility] Cardiologist: Debbe Odea, MD   Subjective   No chest pain but still dyspneic with movement unless she goes slow  Inpatient Medications    Scheduled Meds:  amLODipine  5 mg Oral QHS   aspirin EC  81 mg Oral Daily   atorvastatin  80 mg Oral QHS   Chlorhexidine Gluconate Cloth  6 each Topical Daily   fenofibrate  160 mg Oral Daily   ferrous sulfate  324 mg Oral QODAY   fluticasone  2 spray Each Nare Daily   melatonin  5 mg Oral QHS   metoprolol tartrate  25 mg Oral BID   mirabegron ER  50 mg Oral QHS   mupirocin ointment  1 Application Nasal BID   sodium chloride flush  10-40 mL Intracatheter Q12H   Continuous Infusions:  sodium chloride     heparin 1,300 Units/hr (08/22/22 0725)   PRN Meds: sodium chloride, acetaminophen, ondansetron (ZOFRAN) IV, sodium chloride flush   Vital Signs    Vitals:   08/21/22 0830 08/21/22 2007 08/21/22 2219 08/22/22 0345  BP: (!) 122/57 (!) 120/59 127/61 (!) 111/57  Pulse: 67 (!) 59 60 (!) 51  Resp: 16     Temp: 98 F (36.7 C) 98.2 F (36.8 C)  97.7 F (36.5 C)  TempSrc: Oral Oral  Oral  SpO2:  99%  99%  Weight:      Height:        Intake/Output Summary (Last 24 hours) at 08/22/2022 0802 Last data filed at 08/22/2022 0549 Gross per 24 hour  Intake 310.95 ml  Output --  Net 310.95 ml      08/20/2022    2:29 PM 08/19/2022    8:24 AM 08/08/2022    1:44 PM  Last 3 Weights  Weight (lbs) 176 lb 12.6 oz 176 lb 12.8 oz 177 lb 2 oz  Weight (kg) 80.19 kg 80.196 kg 80.343 kg      Telemetry    SR to SB - Personally Reviewed  ECG    No new - Personally Reviewed  Physical Exam   GEN: No acute distress.   Neck: No JVD Cardiac: RRR, no murmurs, rubs, or gallops.  Respiratory: Clear to auscultation bilaterally. GI: Soft, nontender, non-distended  MS: No edema; No deformity. Neuro:  Nonfocal  Psych: Normal affect    Labs    High Sensitivity Troponin:  No results for input(s): "TROPONINIHS" in the last 720 hours.   Chemistry Recent Labs  Lab 08/19/22 0957 08/21/22 0301 08/22/22 0527  NA 141 143 139  K 3.9 4.3 3.9  CL 109 111 109  CO2 25 23 24   GLUCOSE 114* 112* 120*  BUN 27* 24* 27*  CREATININE 1.07* 1.14* 1.20*  CALCIUM 9.9 8.9 8.7*  PROT 7.5  --   --   ALBUMIN 4.1 3.4*  --   AST 20  --   --   ALT 19  --   --   ALKPHOS 66  --   --   BILITOT 0.4  --   --   GFRNONAA 53* 49* 46*  ANIONGAP 7 9 6     Lipids  Recent Labs  Lab 08/19/22 0957  CHOL 171  TRIG 160*  HDL 67  LDLCALC 72  CHOLHDL 2.6    Hematology Recent Labs  Lab 08/19/22 0957 08/21/22 0301 08/22/22 0527  WBC 5.2 5.8 5.7  RBC 3.96 3.73*  3.47*  HGB 11.7* 11.1* 10.4*  HCT 35.9* 34.1* 31.7*  MCV 90.7 91.4 91.4  MCH 29.5 29.8 30.0  MCHC 32.6 32.6 32.8  RDW 12.9 13.2 13.1  PLT 281 275 236   Thyroid  Recent Labs  Lab 08/21/22 0301  TSH 4.751*    BNPNo results for input(s): "BNP", "PROBNP" in the last 168 hours.  DDimer No results for input(s): "DDIMER" in the last 168 hours.   Radiology    DG Orthopantogram  Result Date: 08/21/2022 CLINICAL DATA:  Pre-op exam EXAM: ORTHOPANTOGRAM/PANORAMIC COMPARISON:  None Available. 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 No significant periodontal disease identified allowing for midline artifact typical of the Panorex technique. The mandible is intact. The temporomandibular joints appear unremarkable. IMPRESSION: No acute findings. Electronically Signed   By: Carey Bullocks M.D.   On: 08/21/2022 16:29   [place] EKG SITE RITE  Result Date: 08/21/2022 If Site Rite image not attached, placement could not be confirmed due to current cardiac rhythm.  ECHOCARDIOGRAM COMPLETE  Result Date: 08/20/2022    ECHOCARDIOGRAM REPORT   Patient Name:   Lisa [name] Date of Exam: 08/20/2022 Medical Rec #:  [id number]        Height:        63.0 in Accession #:    [id number]       Weight:       176.8 lb Date of Birth:  December 27, 1940        BSA:          1.835 m Patient Age:    81 years         BP:           130/56 mmHg Patient Gender: F                HR:           66 bpm. Exam Location:  Inpatient Procedure: 2D Echo, Color Doppler and Cardiac Doppler Indications:    R07.9* Chest pain, unspecified  History:        Patient has prior history of Echocardiogram examinations, most                 recent 11/07/2020. Risk Factors:Hypertension, Dyslipidemia and                 Sleep Apnea.  Sonographer:    Irving Burton Senior RDCS Referring Phys: [id number] MUHAMMAD A ARIDA IMPRESSIONS  1. Left ventricular ejection fraction, by estimation, is 60 to 65%. The left ventricle has normal function. The left ventricle has no regional wall motion abnormalities. Left ventricular diastolic parameters are consistent with Grade I diastolic dysfunction (impaired relaxation).  2. Right ventricular systolic function is normal. The right ventricular size is normal. There is normal pulmonary artery systolic pressure. The estimated right ventricular systolic pressure is 27.2 mmHg.  3. The mitral valve is normal in structure. Mild mitral valve regurgitation. No evidence of mitral stenosis. Moderate mitral annular calcification.  4. The aortic valve is tricuspid. There is mild calcification of the aortic valve. There is mild thickening of the aortic valve. Aortic valve regurgitation is not visualized. Aortic valve sclerosis/calcification is present, without any evidence of aortic stenosis.  5. The inferior vena cava is normal in size with greater than 50% respiratory variability, suggesting right atrial pressure of 3 mmHg. Comparison(s): No significant change from prior study. Prior images reviewed side by side. FINDINGS  Left Ventricle: Left ventricular ejection fraction, by estimation, is  60 to 65%. The left ventricle has normal function. The left ventricle has no regional wall motion  abnormalities. The left ventricular internal cavity size was normal in size. There is  no left ventricular hypertrophy. Left ventricular diastolic parameters are consistent with Grade I diastolic dysfunction (impaired relaxation). Indeterminate filling pressures. Right Ventricle: The right ventricular size is normal. No increase in right ventricular wall thickness. Right ventricular systolic function is normal. There is normal pulmonary artery systolic pressure. The tricuspid regurgitant velocity is 2.46 m/s, and  with an assumed right atrial pressure of 3 mmHg, the estimated right ventricular systolic pressure is [id number] mmHg. Left Atrium: Left atrial size was normal in size. Right Atrium: Right atrial size was normal in size. Pericardium: There is no evidence of pericardial effusion. Mitral Valve: The mitral valve is normal in structure. Moderate mitral annular calcification. Mild mitral valve regurgitation. No evidence of mitral valve stenosis. Tricuspid Valve: The tricuspid valve is normal in structure. Tricuspid valve regurgitation is not demonstrated. No evidence of tricuspid stenosis. Aortic Valve: The aortic valve is tricuspid. There is mild calcification of the aortic valve. There is mild thickening of the aortic valve. Aortic valve regurgitation is not visualized. Aortic valve sclerosis/calcification is present, without any evidence of aortic stenosis. Pulmonic Valve: The pulmonic valve was normal in structure. Pulmonic valve regurgitation is not visualized. No evidence of pulmonic stenosis. Aorta: The aortic root is normal in size and structure. Venous: The inferior vena cava is normal in size with greater than 50% respiratory variability, suggesting right atrial pressure of 3 mmHg. IAS/Shunts: No atrial level shunt detected by color flow Doppler.  LEFT VENTRICLE PLAX 2D LVIDd:         5.10 cm   Diastology LVIDs:         3.60 cm   LV e' medial:    5.00 cm/s LV PW:         1.10 cm   LV E/e' medial:  12.4 LV  IVS:        0.90 cm   LV e' lateral:   6.85 cm/s LVOT diam:     2.00 cm   LV E/e' lateral: 9.1 LV SV:         56 LV SV Index:   30 LVOT Area:     3.14 cm  RIGHT VENTRICLE RV S prime:     13.90 cm/s TAPSE (M-mode): 2.0 cm LEFT ATRIUM             Index        RIGHT ATRIUM           Index LA diam:        3.60 cm 1.96 cm/m   RA Area:     17.20 cm LA Vol (A2C):   34.1 ml 18.58 ml/m  RA Volume:   44.50 ml  24.25 ml/m LA Vol (A4C):   34.4 ml 18.75 ml/m LA Biplane Vol: 36.3 ml 19.78 ml/m  AORTIC VALVE LVOT Vmax:   66.60 cm/s LVOT Vmean:  53.800 cm/s LVOT VTI:    0.178 m  AORTA Ao Root diam: 2.90 cm Ao Asc diam:  2.90 cm MITRAL VALVE                TRICUSPID VALVE MV Area (PHT): 3.34 cm     TR Peak grad:   24.2 mmHg MV Decel Time: 227 msec     TR Vmax:        246.00 cm/s MV E velocity: 62.10 cm/s  MV A velocity: 109.00 cm/s  SHUNTS MV E/A ratio:  0.57         Systemic VTI:  0.18 m                             Systemic Diam: 2.00 cm Sanda Klein MD Electronically signed by Sanda Klein MD Signature Date/Time: 08/20/2022/3:38:15 PM    Final    CARDIAC CATHETERIZATION  Result Date: 08/20/2022   Mid LM to Ost LAD lesion is 90% stenosed.   Ost Cx to Prox Cx lesion is 99% stenosed.   Prox LAD to Mid LAD lesion is 30% stenosed.   Prox RCA lesion is 85% stenosed.   The left ventricular systolic function is normal.   LV end diastolic pressure is mildly elevated.   The left ventricular ejection fraction is 55-65% by visual estimate. 1.  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  The coronary arteries are heavily calcified. 2.  Normal LV systolic function mildly elevated left ventricular end-diastolic pressure.  No significant gradient across aortic valve. Recommendations: Recommend CABG for revascularization.  Given the patient's symptoms at rest, recommend admission to the  hospital and initiation of heparin drip.  Unfortunately, the patient is on clopidogrel therapy as an outpatient and this was discontinued today.  CABG can likely be performed early next week.  The coronary anatomy is not favorable for PCI.   Cardiac Studies   Cath: 08/20/22     Mid LM to Ost LAD lesion is 90% stenosed.   Ost Cx to Prox Cx lesion is 99% stenosed.   Prox LAD to Mid LAD lesion is 30% stenosed.   Prox RCA lesion is 85% stenosed.   The left ventricular systolic function is normal.   LV end diastolic pressure is mildly elevated.   The left ventricular ejection fraction is 55-65% by visual estimate.   1.  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  The coronary arteries are heavily calcified. 2.  Normal LV systolic function mildly elevated left ventricular end-diastolic pressure.  No significant gradient across aortic valve.   Recommendations: Recommend CABG for revascularization.  Given the patient's symptoms at rest, recommend admission to the hospital and initiation of heparin drip.  Unfortunately, the patient is on clopidogrel therapy as an outpatient and this was discontinued today.  CABG can likely be performed early next week.  The coronary anatomy is not favorable for PCI.   Diagnostic Dominance: Co-dominant  Echo: 08/20/22   IMPRESSIONS     1. Left ventricular ejection fraction, by estimation, is 60 to 65%. The  left ventricle has normal function. The left ventricle has no regional  wall motion abnormalities. Left ventricular diastolic parameters are  consistent with Grade I diastolic  dysfunction (impaired relaxation).   2. Right ventricular systolic function is normal. The right ventricular  size is normal. There is normal pulmonary artery systolic pressure. The  estimated right ventricular systolic pressure is [id number] mmHg.    3. The mitral valve is normal in structure. Mild mitral valve  regurgitation. No evidence of mitral stenosis. Moderate mitral annular  calcification.   4. The aortic valve is tricuspid. There is mild calcification of the  aortic valve. There is mild thickening  of the aortic valve. Aortic valve  regurgitation is not visualized. Aortic valve sclerosis/calcification is  present, without any evidence of  aortic stenosis.   5. The inferior vena cava is normal in size with greater than 50%  respiratory variability, suggesting right atrial pressure of 3 mmHg.   Comparison(s): No significant change from prior study. Prior images  reviewed side by side.   Patient Profile     81 y.o. female past medical history of CAD, stroke, CKD stage II, hypertension, hyperlipidemia and GERD who was recently seen in the office for complaints of exertional dyspnea and chest discomfort.  Underwent outpatient coronary CTA which was abnormal and presented for cardiac cath and now plans for CABG  Assessment & Plan    CAD Dyspnea on exertion -- Underwent outpatient cardiac catheterization noted above with codominant coronary arteries with severe distal left main bifurcation disease into the ostium of the LAD and left circumflex of 90 and 99%.  Also with 85% proximal RCA lesion.  Recommendations for TCTS evaluation for CABG though she needs to undergo washout of Plavix.  Tentatively planned for CABG on 9/5 with Dr. Darcey Nora.  LVEF 60 to [id number], grade 1 diastolic dysfunction, normal RV size and function with normal pulmonary artery pressure. -- Reduced aspirin to 81 mg, increase atorvastatin to 80 mg daily, continue fenofibrate, metoprolol and Norvasc -continue IV heparin    Hypertension -- well controlled -- Continue metoprolol 25 mg twice daily and Norvasc 5 mg daily   Hyperlipidemia -- LDL 72, HDL 61, triglycerides 160 -- Increased atorvastatin to 80 mg daily -- We will need LFT/FLP in 8 weeks   CKD stage  II -- Creatinine 1.3>> 1.0>> 1.1>>1.20 -- Follow BMET   Mild mitral regurgitation -- Noted on echo 8/30  Hx of CVA and was on Plavix  For questions or updates, please contact [place] Please consult [email or web address] for contact info under        Signed, Cecilie Kicks, NP  08/22/2022, 8:02 AM     ATTENDING ATTESTATION:  After conducting a review of all available clinical information with the care team, interviewing the patient, and performing a physical exam, I agree with the findings and plan described in this note.   Patient remains well without chest pain or significant dyspnea.  No acute events overnight.   GEN: No acute distress.   HEENT:  MMM, no JVD, no scleral icterus Cardiac: RRR, soft systolic murmur, no rubs, or gallops.  Respiratory: Clear to auscultation bilaterally. GI: Soft, nontender, non-distended  MS: No edema; No deformity. Neuro:  Nonfocal  Vasc:  +2 radial pulses  Plan: Multivessel coronary artery disease: Awaiting CABG on Tuesday as Plavix is washing out.  Continue metoprolol and heparin. 2.  Hypertension: BP is well controlled on her current regimen; continue metoprolol. 3.  Hyperlipidemia: Continue high-dose atorvastatin 4.  CKD stage II: Creatinine is stable at around 1.2.  Lenna Sciara, MD Pager [phone]

## 2022-08-22 NOTE — Care Management Important Message (Signed)
Important Message  Patient Details  Name: Lisa [name], Dr. MRN: [id number] Date of Birth: [birth year]   Medicare Important Message Given:  Yes     Renie Ora 08/22/2022, 12:13 PM

## 2022-08-23 DIAGNOSIS — I200 Unstable angina: Secondary | ICD-10-CM | POA: Diagnosis not present

## 2022-08-23 LAB — BASIC METABOLIC PANEL
Anion gap: 5 (ref 5–15)
BUN: 25 mg/dL — ABNORMAL HIGH (ref 8–23)
CO2: 25 mmol/L (ref 22–32)
Calcium: 8.9 mg/dL (ref 8.9–10.3)
Chloride: 111 mmol/L (ref 98–111)
Creatinine, Ser: 1.14 mg/dL — ABNORMAL HIGH (ref 0.44–1.00)
GFR, Estimated: 49 mL/min — ABNORMAL LOW (ref >60)
Glucose, Bld: 105 mg/dL — ABNORMAL HIGH (ref 70–99)
Potassium: 4.1 mmol/L (ref 3.5–5.1)
Sodium: 141 mmol/L (ref 135–145)

## 2022-08-23 LAB — CBC
HCT: 30.9 % — ABNORMAL LOW (ref 36.0–46.0)
Hemoglobin: 10.2 g/dL — ABNORMAL LOW (ref 12.0–15.0)
MCH: 30.2 pg (ref 26.0–34.0)
MCHC: 33 g/dL (ref 30.0–36.0)
MCV: 91.4 fL (ref 80.0–100.0)
Platelets: 223 10*3/uL (ref 150–400)
RBC: 3.38 MIL/uL — ABNORMAL LOW (ref 3.87–5.11)
RDW: 13.1 % (ref 11.5–15.5)
WBC: 5.1 10*3/uL (ref 4.0–10.5)
nRBC: 0 % (ref 0.0–0.2)

## 2022-08-23 LAB — HEPARIN LEVEL (UNFRACTIONATED): Heparin Unfractionated: 0.46 IU/mL (ref 0.30–0.70)

## 2022-08-23 NOTE — Plan of Care (Signed)
  Problem: Education: Goal: Understanding of CV disease, CV risk reduction, and recovery process will improve Outcome: Progressing Goal: Individualized Educational Video(s) Outcome: Progressing   Problem: Activity: Goal: Ability to return to baseline activity level will improve Outcome: Progressing   Problem: Cardiovascular: Goal: Ability to achieve and maintain adequate cardiovascular perfusion will improve Outcome: Progressing Goal: Vascular access site(s) Level 0-1 will be maintained Outcome: Progressing   Problem: Health Behavior/Discharge Planning: Goal: Ability to safely manage health-related needs after discharge will improve Outcome: Progressing   Problem: Education: Goal: Knowledge of General Education information will improve Description: Including pain rating scale, medication(s)/side effects and non-pharmacologic comfort measures Outcome: Progressing   Problem: Health Behavior/Discharge Planning: Goal: Ability to manage health-related needs will improve Outcome: Progressing   Problem: Clinical Measurements: Goal: Ability to maintain clinical measurements within normal limits will improve Outcome: Progressing Goal: Will remain free from infection Outcome: Progressing Goal: Diagnostic test results will improve Outcome: Progressing Goal: Respiratory complications will improve Outcome: Progressing Goal: Cardiovascular complication will be avoided Outcome: Progressing   Problem: Activity: Goal: Risk for activity intolerance will decrease Outcome: Progressing   Problem: Nutrition: Goal: Adequate nutrition will be maintained Outcome: Progressing   Problem: Coping: Goal: Level of anxiety will decrease Outcome: Progressing   Problem: Elimination: Goal: Will not experience complications related to bowel motility Outcome: Progressing Goal: Will not experience complications related to urinary retention Outcome: Progressing   Problem: Pain Managment: Goal:  General experience of comfort will improve Outcome: Progressing   Problem: Safety: Goal: Ability to remain free from injury will improve Outcome: Progressing   Problem: Skin Integrity: Goal: Risk for impaired skin integrity will decrease Outcome: Progressing   Problem: Education: Goal: Will demonstrate proper wound care and an understanding of methods to prevent future damage Outcome: Progressing Goal: Knowledge of disease or condition will improve Outcome: Progressing Goal: Knowledge of the prescribed therapeutic regimen will improve Outcome: Progressing Goal: Individualized Educational Video(s) Outcome: Progressing   Problem: Activity: Goal: Risk for activity intolerance will decrease Outcome: Progressing   Problem: Cardiac: Goal: Will achieve and/or maintain hemodynamic stability Outcome: Progressing   Problem: Clinical Measurements: Goal: Postoperative complications will be avoided or minimized Outcome: Progressing   Problem: Respiratory: Goal: Respiratory status will improve Outcome: Progressing   Problem: Skin Integrity: Goal: Wound healing without signs and symptoms of infection Outcome: Progressing Goal: Risk for impaired skin integrity will decrease Outcome: Progressing   Problem: Urinary Elimination: Goal: Ability to achieve and maintain adequate renal perfusion and functioning will improve Outcome: Progressing   

## 2022-08-23 NOTE — Progress Notes (Signed)
Rounding Note    Patient Name: Lisa [name], Dr. Date of Encounter: 08/23/2022  [place] Cardiologist: Kate Sable, MD   Subjective   No angina Very pleasant and patient about waiting for CABG Tuesday  Inpatient Medications    Scheduled Meds:  amLODipine  5 mg Oral QHS   aspirin EC  81 mg Oral Daily   atorvastatin  80 mg Oral QHS   Chlorhexidine Gluconate Cloth  6 each Topical Daily   docusate sodium  100 mg Oral Daily   [START ON 08/26/2022] epinephrine  0-10 mcg/min Intravenous To OR   fenofibrate  160 mg Oral Daily   ferrous sulfate  324 mg Oral QODAY   fluticasone  2 spray Each Nare Daily   [START ON 08/26/2022] heparin sodium (porcine) 2,500 Units, papaverine 30 mg in electrolyte-A (PLASMALYTE-A PH 7.4) 500 mL irrigation   Irrigation To OR   [START ON 08/26/2022] insulin   Intravenous To OR   [START ON 08/26/2022] [place] Blood Cardioplegia vial (lidocaine/magnesium/mannitol 0.26g-4g-6.4g)   Intracoronary To OR   melatonin  5 mg Oral QHS   metoprolol tartrate  25 mg Oral BID   mirabegron ER  50 mg Oral QHS   mupirocin ointment  1 Application Nasal BID   [START ON 08/26/2022] phenylephrine  30-200 mcg/min Intravenous To OR   polyethylene glycol  17 g Oral Daily   [START ON 08/26/2022] potassium chloride  80 mEq Other To OR   sodium chloride flush  10-40 mL Intracatheter Q12H   [START ON 08/26/2022] tranexamic acid  15 mg/kg (Adjusted) Intravenous To OR   [START ON 08/26/2022] tranexamic acid  2 mg/kg Intracatheter To OR   Continuous Infusions:  sodium chloride     [START ON 08/26/2022]  ceFAZolin (ANCEF) IV     [START ON 08/26/2022]  ceFAZolin (ANCEF) IV     [START ON 08/26/2022] dexmedetomidine     [START ON 08/26/2022] heparin 30,000 units/NS 1000 mL solution for CELLSAVER     heparin 1,300 Units/hr (08/23/22 0157)   [START ON 08/26/2022] milrinone     [START ON 08/26/2022] nitroGLYCERIN     [START ON 08/26/2022] norepinephrine     [START ON 08/26/2022] tranexamic  acid (CYKLOKAPRON) 2,500 mg in sodium chloride 0.9 % 250 mL (10 mg/mL) infusion     [START ON 08/26/2022] vancomycin     PRN Meds: sodium chloride, acetaminophen, ondansetron (ZOFRAN) IV, sodium chloride flush   Vital Signs    Vitals:   08/22/22 0345 08/22/22 0925 08/22/22 2052 08/23/22 0426  BP: (!) 111/57 (!) 127/53 123/62 115/64  Pulse: (!) 51 60 (!) 58 (!) 49  Resp:  16 18 16   Temp: 97.7 F (36.5 C) 97.8 F (36.6 C) 98.1 F (36.7 C) 97.6 F (36.4 C)  TempSrc: Oral Oral Oral Axillary  SpO2: 99% 99%    Weight:      Height:       No intake or output data in the 24 hours ending 08/23/22 0852     08/20/2022    2:29 PM 08/19/2022    8:24 AM 08/08/2022    1:44 PM  Last 3 Weights  Weight (lbs) 176 lb 12.6 oz 176 lb 12.8 oz 177 lb 2 oz  Weight (kg) 80.19 kg 80.196 kg 80.343 kg      Telemetry    SR to SB - Personally Reviewed  ECG    No new - Personally Reviewed  Physical Exam   GEN: No acute distress.  Neck: No JVD Cardiac: RRR, no murmurs, rubs, or gallops.  Respiratory: Clear to auscultation bilaterally. GI: Soft, nontender, non-distended  MS: No edema; No deformity. Neuro:  Nonfocal  Psych: Normal affect   Labs    High Sensitivity Troponin:  No results for input(s): "TROPONINIHS" in the last 720 hours.   Chemistry Recent Labs  Lab 08/19/22 0957 08/21/22 0301 08/22/22 0527 08/23/22 0500  NA 141 143 139 141  K 3.9 4.3 3.9 4.1  CL 109 111 109 111  CO2 25 23 24 25   GLUCOSE 114* 112* 120* 105*  BUN 27* 24* 27* 25*  CREATININE 1.07* 1.14* 1.20* 1.14*  CALCIUM 9.9 8.9 8.7* 8.9  PROT 7.5  --   --   --   ALBUMIN 4.1 3.4*  --   --   AST 20  --   --   --   ALT 19  --   --   --   ALKPHOS 66  --   --   --   BILITOT 0.4  --   --   --   GFRNONAA 53* 49* 46* 49*  ANIONGAP 7 9 6 5     Lipids  Recent Labs  Lab 08/19/22 0957  CHOL 171  TRIG 160*  HDL 67  LDLCALC 72  CHOLHDL 2.6    Hematology Recent Labs  Lab 08/21/22 0301 08/22/22 0527  08/23/22 0500  WBC 5.8 5.7 5.1  RBC 3.73* 3.47* 3.38*  HGB 11.1* 10.4* 10.2*  HCT 34.1* 31.7* 30.9*  MCV 91.4 91.4 91.4  MCH 29.8 30.0 30.2  MCHC 32.6 32.8 33.0  RDW 13.2 13.1 13.1  PLT 275 236 223   Thyroid  Recent Labs  Lab 08/21/22 0301  TSH 4.751*    BNPNo results for input(s): "BNP", "PROBNP" in the last 168 hours.  DDimer No results for input(s): "DDIMER" in the last 168 hours.   Radiology    VAS US DOPPLER PRE CABG  Result Date: 08/22/2022 PREOPERATIVE VASCULAR EVALUATION Patient Name:  Lisa [name]  Date of Exam:   08/22/2022 Medical Rec #: [id number]         Accession #:    [id number] Date of Birth: 04-25-1941         Patient Gender: F Patient Age:   81 years Exam Location:  [facility] Procedure:      VAS US DOPPLER PRE CABG Referring Phys: Collier Salina VANTRIGT --------------------------------------------------------------------------------  Indications:      Pre-CABG. Risk Factors:     Hypertension, hyperlipidemia. Comparison Study: No prior study Performing Technologist: Maudry Mayhew MHA, RVT, RDCS, RDMS  Examination Guidelines: A complete evaluation includes B-mode imaging, spectral Doppler, color Doppler, and power Doppler as needed of all accessible portions of each vessel. Bilateral testing is considered an integral part of a complete examination. Limited examinations for reoccurring indications may be performed as noted.  Right Carotid Findings: +----------+--------+--------+--------+---------------------+------------------+           PSV cm/sEDV cm/sStenosisDescribe             Comments           +----------+--------+--------+--------+---------------------+------------------+ CCA Prox  80      14                                   intimal thickening +----------+--------+--------+--------+---------------------+------------------+ CCA Distal88      16  focal, smooth and                                                          heterogenous                            +----------+--------+--------+--------+---------------------+------------------+ ICA Prox  68      14              smooth and                                                                heterogenous                            +----------+--------+--------+--------+---------------------+------------------+ ICA Distal98      30                                                      +----------+--------+--------+--------+---------------------+------------------+ ECA       77      11              calcific and focal                      +----------+--------+--------+--------+---------------------+------------------+ +----------+--------+-------+----------------+------------+           PSV cm/sEDV cmsDescribe        Arm Pressure +----------+--------+-------+----------------+------------+ Subclavian95             Multiphasic, WNL             +----------+--------+-------+----------------+------------+ +---------+--------+--+--------+--+---------+ VertebralPSV cm/s42EDV cm/s12Antegrade +---------+--------+--+--------+--+---------+ Left Carotid Findings: +----------+--------+--------+--------+---------------------+------------------+           PSV cm/sEDV cm/sStenosisDescribe             Comments           +----------+--------+--------+--------+---------------------+------------------+ CCA Prox  83      14                                                      +----------+--------+--------+--------+---------------------+------------------+ CCA Distal88      14                                   intimal thickening +----------+--------+--------+--------+---------------------+------------------+ ICA Prox  71      16              smooth, hyperechoic  and calcific                             +----------+--------+--------+--------+---------------------+------------------+ ICA Distal84      28                                                      +----------+--------+--------+--------+---------------------+------------------+ ECA       93                      smooth and                                                                heterogenous                            +----------+--------+--------+--------+---------------------+------------------+ +----------+--------+--------+----------------+------------+ SubclavianPSV cm/sEDV cm/sDescribe        Arm Pressure +----------+--------+--------+----------------+------------+           144             Multiphasic, WNL             +----------+--------+--------+----------------+------------+ +---------+--------+--+--------+--+---------+ VertebralPSV cm/s64EDV cm/s15Antegrade +---------+--------+--+--------+--+---------+  ABI Findings: +--------+------------------+-----+---------+--------+ Right   Rt Pressure (mmHg)IndexWaveform Comment  +--------+------------------+-----+---------+--------+ Brachial                       triphasic         +--------+------------------+-----+---------+--------+ PTA     178               1.09 triphasic         +--------+------------------+-----+---------+--------+ DP      169               1.03 triphasic         +--------+------------------+-----+---------+--------+ +--------+------------------+-----+---------+-------+ Left    Lt Pressure (mmHg)IndexWaveform Comment +--------+------------------+-----+---------+-------+ [id number]                    triphasic        +--------+------------------+-----+---------+-------+ PTA     172               1.05 triphasic        +--------+------------------+-----+---------+-------+ DP      164               1.00 triphasic        +--------+------------------+-----+---------+-------+  +-------+---------------+----------------+ ABI/TBIToday's ABI/TBIPrevious ABI/TBI +-------+---------------+----------------+ Right  1.09                            +-------+---------------+----------------+ Left   1.05                            +-------+---------------+----------------+  Right Doppler Findings: +-----------+--------+-----+---------+--------------------+ Site       PressureIndexDoppler  Comments             +-----------+--------+-----+---------+--------------------+ Brachial                triphasic                     +-----------+--------+-----+---------+--------------------+  Radial                  triphasic                     +-----------+--------+-----+---------+--------------------+ Ulnar                   triphasic                     +-----------+--------+-----+---------+--------------------+ Palmar Arch                      Within normal limits +-----------+--------+-----+---------+--------------------+  Left Doppler Findings: +-----------+--------+-----+---------+--------------------+ Site       PressureIndexDoppler  Comments             +-----------+--------+-----+---------+--------------------+ Brachial   164          triphasic                     +-----------+--------+-----+---------+--------------------+ Radial                  triphasic                     +-----------+--------+-----+---------+--------------------+ Ulnar                   triphasic                     +-----------+--------+-----+---------+--------------------+ Palmar Arch                      Within normal limits +-----------+--------+-----+---------+--------------------+   Summary: Right Carotid: Velocities in the right ICA are consistent with a 1-39% stenosis. Left Carotid: Velocities in the left ICA are consistent with a 1-39% stenosis. Vertebrals:  Bilateral vertebral arteries demonstrate antegrade flow. Subclavians: Normal flow hemodynamics  were seen in bilateral subclavian              arteries. Right ABI: Resting right ankle-brachial index is within normal range. Left ABI: Resting left ankle-brachial index is within normal range. Right Upper Extremity: Doppler waveforms remain within normal limits with right radial compression. Doppler waveforms remain within normal limits with right ulnar compression. Left Upper Extremity: Doppler waveforms decrease <50% w left radial compression. Doppler waveforms decrease <50% with left ulnar compression.  Electronically signed by Servando Snare MD on 08/22/2022 at 3:09:07 PM.    Final    DG Orthopantogram  Result Date: 08/21/2022 CLINICAL DATA:  Pre-op exam EXAM: ORTHOPANTOGRAM/PANORAMIC COMPARISON:  None Available. 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 No significant periodontal disease identified allowing for midline artifact typical of the Panorex technique. The mandible is intact. The temporomandibular joints appear unremarkable. IMPRESSION: No acute findings. Electronically Signed   By: Richardean Sale M.D.   On: 08/21/2022 16:29   [place] EKG SITE RITE  Result Date: 08/21/2022 If Site Rite image not attached, placement could not be confirmed due to current cardiac rhythm.   Cardiac Studies   Cath: 08/20/22     Mid LM to Ost LAD lesion is 90% stenosed.   Ost Cx to Prox Cx lesion is 99% stenosed.   Prox LAD to Mid LAD lesion is 30% stenosed.   Prox RCA lesion is 85% stenosed.   The left ventricular systolic function is normal.   LV end diastolic pressure is mildly elevated.   The left ventricular ejection fraction is 55-65% by visual estimate.  1.  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  The coronary arteries are heavily calcified. 2.  Normal LV systolic function mildly elevated left ventricular  end-diastolic pressure.  No significant gradient across aortic valve.   Recommendations: Recommend CABG for revascularization.  Given the patient's symptoms at rest, recommend admission to the hospital and initiation of heparin drip.  Unfortunately, the patient is on clopidogrel therapy as an outpatient and this was discontinued today.  CABG can likely be performed early next week.  The coronary anatomy is not favorable for PCI.   Diagnostic Dominance: Co-dominant  Echo: 08/20/22   IMPRESSIONS     1. Left ventricular ejection fraction, by estimation, is 60 to 65%. The  left ventricle has normal function. The left ventricle has no regional  wall motion abnormalities. Left ventricular diastolic parameters are  consistent with Grade I diastolic  dysfunction (impaired relaxation).   2. Right ventricular systolic function is normal. The right ventricular  size is normal. There is normal pulmonary artery systolic pressure. The  estimated right ventricular systolic pressure is [id number] mmHg.   3. The mitral valve is normal in structure. Mild mitral valve  regurgitation. No evidence of mitral stenosis. Moderate mitral annular  calcification.   4. The aortic valve is tricuspid. There is mild calcification of the  aortic valve. There is mild thickening of the aortic valve. Aortic valve  regurgitation is not visualized. Aortic valve sclerosis/calcification is  present, without any evidence of  aortic stenosis.   5. The inferior vena cava is normal in size with greater than 50%  respiratory variability, suggesting right atrial pressure of 3 mmHg.   Comparison(s): No significant change from prior study. Prior images  reviewed side by side.   Patient Profile     81 y.o. female past medical history of CAD, stroke, CKD stage II, hypertension, hyperlipidemia and GERD who was recently seen in the office for complaints of exertional dyspnea and chest discomfort.  Underwent outpatient coronary CTA which  was abnormal and presented for cardiac cath and now plans for CABG  Assessment & Plan    CAD Dyspnea on exertion -- Underwent outpatient cardiac catheterization noted above with codominant coronary arteries with severe distal left main bifurcation disease into the ostium of the LAD and left circumflex of 90 and 99%.  Also with 85% proximal RCA lesion.  CABG Tuesday with PVT waiting on Plavix washout and holiday weekend Continue heparin  Hypertension -- well controlled -- Continue metoprolol 25 mg twice daily and Norvasc 5 mg daily   Hyperlipidemia -- LDL 72, HDL 61, triglycerides 160 -- Increased atorvastatin to 80 mg daily -- We will need LFT/FLP in 8 weeks   CKD stage II -- Creatinine 1.3>> 1.0>> 1.1>>1.20 -- Follow BMET   Mild mitral regurgitation -- Noted on echo 8/30  Hx of CVA and was on Plavix  For questions or updates, please contact [place] Please consult [email or web address] for contact info under        Signed, Jenkins Rouge, MD  08/23/2022, 8:52 AM     ATTENDING ATTESTATION:  After conducting a review of all available clinical information with the care team, interviewing the patient, and performing a physical exam, I agree with the findings and plan described in this note.   Patient remains well without chest pain or significant dyspnea.  No acute events overnight.   GEN:  No acute distress.   HEENT:  MMM, no JVD, no scleral icterus Cardiac: RRR, soft systolic murmur, no rubs, or gallops.  Respiratory: Clear to auscultation bilaterally. GI: Soft, nontender, non-distended  MS: No edema; No deformity. Neuro:  Nonfocal  Vasc:  +2 radial pulses  Plan: Multivessel coronary artery disease: Awaiting CABG on Tuesday as Plavix is washing out.  Continue metoprolol and heparin. 2.  Hypertension: BP is well controlled on her current regimen; continue metoprolol. 3.  Hyperlipidemia: Continue high-dose atorvastatin 4.  CKD stage II: Creatinine is stable at  around 1.2.  Alverda Skeans, MD Pager [phone]

## 2022-08-23 NOTE — Progress Notes (Signed)
ANTICOAGULATION CONSULT NOTE- Follow Up  Pharmacy Consult for heparin  Indication: chest pain/ACS  Allergies  Allergen Reactions   Other Other (See Comments)    Anesthesia causes nausea and vomiting, memory difficulty and problems processing.   Oxycodone Nausea Only   Sulfa Antibiotics Rash   Sulfasalazine Rash    Patient Measurements: Heparin dosing weight= 70kg  Vital Signs: Temp: 97.6 F (36.4 C) (09/02 0426) Temp Source: Axillary (09/02 0426) BP: 115/64 (09/02 0426) Pulse Rate: 49 (09/02 0426)  Labs: Recent Labs    08/21/22 0301 08/21/22 1240 08/22/22 0527 08/23/22 0500  HGB 11.1*  --  10.4* 10.2*  HCT 34.1*  --  31.7* 30.9*  PLT 275  --  236 223  HEPARINUNFRC 0.12* <0.10* 0.67 0.46  CREATININE 1.14*  --  1.20* 1.14*     Estimated Creatinine Clearance: 39.5 mL/min (A) (by C-G formula based on SCr of 1.14 mg/dL (H)).   Medical History: Past Medical History:  Diagnosis Date   Acne rosacea    Allergic rhinitis    Chronic kidney disease    kidney stones, Nov. 2013- tx with Lithotripsy, still has some present    Cough variant asthma    Degenerative disc disease    knees, ankles, back- OA   GERD (gastroesophageal reflux disease)    Headache(784.0)    since stopping aleve- 07/30/2013   Hyperlipemia    dyslipidemia   Hypertension    [facility] follows pt. Riki Rusk Smart ,has never taken anti-hypertensive   Osteoarthritis    Plantar fasciitis    (using orthotics-they are wearing out)- Dr Luther Bradley   Pneumonia    double pneumonia - relative to sinus infection , had chemical cauterization    PONV (postoperative nausea and vomiting)    urgency, N&V for days, memory & processing   Sleep apnea    Bringing cpap mask and tubing, study last done- 2006    Medications:  Medications Prior to Admission  Medication Sig Dispense Refill Last Dose   acetaminophen (TYLENOL) 500 MG tablet Take 1,000 mg by mouth See admin instructions. 1000 mg in the  morning and at night  Additional 1000 mg daily at mid day if needed.      alendronate (FOSAMAX) 70 MG tablet Take 70 mg by mouth once a week. Take with a full glass of water on an empty stomach.  Saturday Morning   Past Week   amLODipine (NORVASC) 5 MG tablet Take 5 mg by mouth at bedtime.   08/19/2022   atorvastatin (LIPITOR) 40 MG tablet Take 1 tablet (40 mg total) by mouth at bedtime. 90 tablet 0 08/19/2022   Cholecalciferol (VITAMIN D-3) 125 MCG (5000 UT) TABS Take 5,000 Units by mouth daily.   08/19/2022   clopidogrel (PLAVIX) 75 MG tablet Take 1 tablet (75 mg total) by mouth daily. 90 tablet 0 08/20/2022 at 0530   Cobalamin Combinations (NEURIVA PLUS PO) Take 1 capsule by mouth daily.   08/19/2022   fenofibrate (TRICOR) 145 MG tablet Take 1 tablet by mouth daily, need MD visit for further refills (Patient taking differently: Take 145 mg by mouth at bedtime.) 90 tablet 0 08/19/2022   ferrous sulfate 324 MG TBEC Take 324 mg by mouth every other day.   08/19/2022   FIBER PO Take 1 tablet by mouth every morning. Fiber well gummy   08/19/2022   fluticasone (FLONASE) 50 MCG/ACT nasal spray Place 2 sprays into the nose daily. Allergic rhinitis.   08/20/2022 at 0530  hydrochlorothiazide (HYDRODIURIL) 12.5 MG tablet Take 12.5 mg by mouth daily.   08/19/2022   Magnesium 250 MG TABS Take 250 mg by mouth at bedtime.   08/19/2022   Multiple Vitamin (MULTIVITAMIN WITH MINERALS) TABS Take 1 tablet by mouth daily. Centrum  Woman 50+   08/19/2022   Multiple Vitamins-Minerals (EMERGEN-C IMMUNE PO) Take 1,500 mg by mouth daily. 750 mg each gummy   08/19/2022   MYRBETRIQ 50 MG TB24 tablet Take 50 mg by mouth at bedtime.   08/19/2022   OVER THE COUNTER MEDICATION Take 1 tablet by mouth daily. Nervive      Polyethyl Glycol-Propyl Glycol (SYSTANE OP) Place 1 drop into both eyes daily as needed (Dry eyes).      TURMERIC PO Take 1,000 mg by mouth 2 (two) times daily.   08/19/2022   UNABLE TO FIND Take 1 tablet by mouth daily.  GOLI Applecider Vinegar /Gummy   Past Month   Scheduled:   amLODipine  5 mg Oral QHS   aspirin EC  81 mg Oral Daily   atorvastatin  80 mg Oral QHS   Chlorhexidine Gluconate Cloth  6 each Topical Daily   docusate sodium  100 mg Oral Daily   [START ON 08/26/2022] epinephrine  0-10 mcg/min Intravenous To OR   fenofibrate  160 mg Oral Daily   ferrous sulfate  324 mg Oral QODAY   fluticasone  2 spray Each Nare Daily   [START ON 08/26/2022] heparin sodium (porcine) 2,500 Units, papaverine 30 mg in electrolyte-A (PLASMALYTE-A PH 7.4) 500 mL irrigation   Irrigation To OR   [START ON 08/26/2022] insulin   Intravenous To OR   [START ON 08/26/2022] [facility] Blood Cardioplegia vial (lidocaine/magnesium/mannitol 0.26g-4g-6.4g)   Intracoronary To OR   melatonin  5 mg Oral QHS   metoprolol tartrate  25 mg Oral BID   mirabegron ER  50 mg Oral QHS   mupirocin ointment  1 Application Nasal BID   [START ON 08/26/2022] phenylephrine  30-200 mcg/min Intravenous To OR   polyethylene glycol  17 g Oral Daily   [START ON 08/26/2022] potassium chloride  80 mEq Other To OR   sodium chloride flush  10-40 mL Intracatheter Q12H   [START ON 08/26/2022] tranexamic acid  15 mg/kg (Adjusted) Intravenous To OR   [START ON 08/26/2022] tranexamic acid  2 mg/kg Intracatheter To OR    Assessment 81 yo female s/p cath with left main disease for possible CABG. Pharmacy to start heparin 8 hours after sheath removal (removed ~ 10:30am). She was not on anticoagulation at home.   Patient multivessel CABG planned for 9/5- needs clopidogrel washout - last dose 8/29 Heparin drip 1400 uts/hr heparin level 0.46 this am at goal; dropped from 0.67 Cbc stable Spoke with the nurse, no issues with the infusion running or signs/symptoms of bleeding  Goal of Therapy:  Heparin level 0.3-0.7 units/ml Monitor platelets by anticoagulation protocol: Yes   Plan:  -Continue heparin gtt to 1300 units/hr  -heparin level and CBC in am    Arabella Merles,  PharmD. [facility] Acute Care PGY-1 08/23/2022 8:25 AM

## 2022-08-24 DIAGNOSIS — I200 Unstable angina: Secondary | ICD-10-CM | POA: Diagnosis not present

## 2022-08-24 LAB — CBC
HCT: 31.9 % — ABNORMAL LOW (ref 36.0–46.0)
Hemoglobin: 10.3 g/dL — ABNORMAL LOW (ref 12.0–15.0)
MCH: 29.7 pg (ref 26.0–34.0)
MCHC: 32.3 g/dL (ref 30.0–36.0)
MCV: 91.9 fL (ref 80.0–100.0)
Platelets: 221 10*3/uL (ref 150–400)
RBC: 3.47 MIL/uL — ABNORMAL LOW (ref 3.87–5.11)
RDW: 13 % (ref 11.5–15.5)
WBC: 5.3 10*3/uL (ref 4.0–10.5)
nRBC: 0 % (ref 0.0–0.2)

## 2022-08-24 LAB — BASIC METABOLIC PANEL
Anion gap: 6 (ref 5–15)
BUN: 28 mg/dL — ABNORMAL HIGH (ref 8–23)
CO2: 24 mmol/L (ref 22–32)
Calcium: 9 mg/dL (ref 8.9–10.3)
Chloride: 111 mmol/L (ref 98–111)
Creatinine, Ser: 1.16 mg/dL — ABNORMAL HIGH (ref 0.44–1.00)
GFR, Estimated: 48 mL/min — ABNORMAL LOW (ref >60)
Glucose, Bld: 111 mg/dL — ABNORMAL HIGH (ref 70–99)
Potassium: 4.1 mmol/L (ref 3.5–5.1)
Sodium: 141 mmol/L (ref 135–145)

## 2022-08-24 LAB — HEPARIN LEVEL (UNFRACTIONATED): Heparin Unfractionated: 0.5 IU/mL (ref 0.30–0.70)

## 2022-08-24 LAB — SARS CORONAVIRUS 2 BY RT PCR: SARS Coronavirus 2 by RT PCR: NEGATIVE

## 2022-08-24 NOTE — Progress Notes (Signed)
ANTICOAGULATION CONSULT NOTE- Follow Up  Pharmacy Consult for heparin  Indication: chest pain/ACS  Allergies  Allergen Reactions   Other Other (See Comments)    Anesthesia causes nausea and vomiting, memory difficulty and problems processing.   Oxycodone Nausea Only   Sulfa Antibiotics Rash   Sulfasalazine Rash    Patient Measurements: Heparin dosing weight= 70kg  Vital Signs: Temp: 98 F (36.7 C) (09/03 0544) Temp Source: Axillary (09/03 0544) BP: 130/64 (09/03 0544)  Labs: Recent Labs    08/22/22 0527 08/23/22 0500 08/24/22 0500 08/24/22 0520  HGB 10.4* 10.2*  --  10.3*  HCT 31.7* 30.9*  --  31.9*  PLT 236 223  --  221  HEPARINUNFRC 0.67 0.46 0.50  --   CREATININE 1.20* 1.14*  --  1.16*     Estimated Creatinine Clearance: 38.8 mL/min (A) (by C-G formula based on SCr of 1.16 mg/dL (H)).   Medical History: Past Medical History:  Diagnosis Date   Acne rosacea    Allergic rhinitis    Chronic kidney disease    kidney stones, Nov. 2013- tx with Lithotripsy, still has some present    Cough variant asthma    Degenerative disc disease    knees, ankles, back- OA   GERD (gastroesophageal reflux disease)    Headache(784.0)    since stopping aleve- 07/30/2013   Hyperlipemia    dyslipidemia   Hypertension    [facility] follows pt. Riki Rusk Smart ,has never taken anti-hypertensive   Osteoarthritis    Plantar fasciitis    (using orthotics-they are wearing out)- Dr Luther Bradley   Pneumonia    double pneumonia - relative to sinus infection , had chemical cauterization    PONV (postoperative nausea and vomiting)    urgency, N&V for days, memory & processing   Sleep apnea    Bringing cpap mask and tubing, study last done- 2006    Medications:  Medications Prior to Admission  Medication Sig Dispense Refill Last Dose   acetaminophen (TYLENOL) 500 MG tablet Take 1,000 mg by mouth See admin instructions. 1000 mg in the morning and at night  Additional 1000  mg daily at mid day if needed.      alendronate (FOSAMAX) 70 MG tablet Take 70 mg by mouth once a week. Take with a full glass of water on an empty stomach.  Saturday Morning   Past Week   amLODipine (NORVASC) 5 MG tablet Take 5 mg by mouth at bedtime.   08/19/2022   atorvastatin (LIPITOR) 40 MG tablet Take 1 tablet (40 mg total) by mouth at bedtime. 90 tablet 0 08/19/2022   Cholecalciferol (VITAMIN D-3) 125 MCG (5000 UT) TABS Take 5,000 Units by mouth daily.   08/19/2022   clopidogrel (PLAVIX) 75 MG tablet Take 1 tablet (75 mg total) by mouth daily. 90 tablet 0 08/20/2022 at 0530   Cobalamin Combinations (NEURIVA PLUS PO) Take 1 capsule by mouth daily.   08/19/2022   fenofibrate (TRICOR) 145 MG tablet Take 1 tablet by mouth daily, need MD visit for further refills (Patient taking differently: Take 145 mg by mouth at bedtime.) 90 tablet 0 08/19/2022   ferrous sulfate 324 MG TBEC Take 324 mg by mouth every other day.   08/19/2022   FIBER PO Take 1 tablet by mouth every morning. Fiber well gummy   08/19/2022   fluticasone (FLONASE) 50 MCG/ACT nasal spray Place 2 sprays into the nose daily. Allergic rhinitis.   08/20/2022 at 0530   hydrochlorothiazide (HYDRODIURIL)  12.5 MG tablet Take 12.5 mg by mouth daily.   08/19/2022   Magnesium 250 MG TABS Take 250 mg by mouth at bedtime.   08/19/2022   Multiple Vitamin (MULTIVITAMIN WITH MINERALS) TABS Take 1 tablet by mouth daily. Centrum  Woman 50+   08/19/2022   Multiple Vitamins-Minerals (EMERGEN-C IMMUNE PO) Take 1,500 mg by mouth daily. 750 mg each gummy   08/19/2022   MYRBETRIQ 50 MG TB24 tablet Take 50 mg by mouth at bedtime.   08/19/2022   OVER THE COUNTER MEDICATION Take 1 tablet by mouth daily. Nervive      Polyethyl Glycol-Propyl Glycol (SYSTANE OP) Place 1 drop into both eyes daily as needed (Dry eyes).      TURMERIC PO Take 1,000 mg by mouth 2 (two) times daily.   08/19/2022   UNABLE TO FIND Take 1 tablet by mouth daily. GOLI Applecider Vinegar /Gummy   Past  Month   Scheduled:   amLODipine  5 mg Oral QHS   aspirin EC  81 mg Oral Daily   atorvastatin  80 mg Oral QHS   Chlorhexidine Gluconate Cloth  6 each Topical Daily   docusate sodium  100 mg Oral Daily   [START ON 08/26/2022] epinephrine  0-10 mcg/min Intravenous To OR   fenofibrate  160 mg Oral Daily   ferrous sulfate  324 mg Oral QODAY   fluticasone  2 spray Each Nare Daily   [START ON 08/26/2022] heparin sodium (porcine) 2,500 Units, papaverine 30 mg in electrolyte-A (PLASMALYTE-A PH 7.4) 500 mL irrigation   Irrigation To OR   [START ON 08/26/2022] insulin   Intravenous To OR   [START ON 08/26/2022] [facility] Blood Cardioplegia vial (lidocaine/magnesium/mannitol 0.26g-4g-6.4g)   Intracoronary To OR   melatonin  5 mg Oral QHS   metoprolol tartrate  25 mg Oral BID   mirabegron ER  50 mg Oral QHS   mupirocin ointment  1 Application Nasal BID   [START ON 08/26/2022] phenylephrine  30-200 mcg/min Intravenous To OR   polyethylene glycol  17 g Oral Daily   [START ON 08/26/2022] potassium chloride  80 mEq Other To OR   sodium chloride flush  10-40 mL Intracatheter Q12H   [START ON 08/26/2022] tranexamic acid  15 mg/kg (Adjusted) Intravenous To OR   [START ON 08/26/2022] tranexamic acid  2 mg/kg Intracatheter To OR    Assessment 81 yo female s/p cath with left main disease for possible CABG. Pharmacy to start heparin 8 hours after sheath removal (removed ~ 10:30am). She was not on anticoagulation at home.   Patient multivessel CABG planned for 9/5- needs clopidogrel washout - last dose 8/29  Heparin drip 1300 uts/hr heparin level 0.5 this am at goal Cbc stable Spoke with the nurse, no issues with the infusion running or signs/symptoms of bleeding  Goal of Therapy:  Heparin level 0.3-0.7 units/ml Monitor platelets by anticoagulation protocol: Yes   Plan:  -Continue heparin gtt to 1300 units/hr  -heparin level and CBC in am    Arabella Merles, PharmD. [facility] Acute Care PGY-1  08/24/2022 8:24  AM

## 2022-08-24 NOTE — Progress Notes (Signed)
Rounding Note    Patient Name: Lisa [name], Dr. Date of Encounter: 08/24/2022  [place] Cardiologist: Kate Sable, MD   Subjective   No angina Very pleasant and patient about waiting for CABG Tuesday  Inpatient Medications    Scheduled Meds:  amLODipine  5 mg Oral QHS   aspirin EC  81 mg Oral Daily   atorvastatin  80 mg Oral QHS   Chlorhexidine Gluconate Cloth  6 each Topical Daily   docusate sodium  100 mg Oral Daily   [START ON 08/26/2022] epinephrine  0-10 mcg/min Intravenous To OR   fenofibrate  160 mg Oral Daily   ferrous sulfate  324 mg Oral QODAY   fluticasone  2 spray Each Nare Daily   [START ON 08/26/2022] heparin sodium (porcine) 2,500 Units, papaverine 30 mg in electrolyte-A (PLASMALYTE-A PH 7.4) 500 mL irrigation   Irrigation To OR   [START ON 08/26/2022] insulin   Intravenous To OR   [START ON 08/26/2022] [place] Blood Cardioplegia vial (lidocaine/magnesium/mannitol 0.26g-4g-6.4g)   Intracoronary To OR   melatonin  5 mg Oral QHS   metoprolol tartrate  25 mg Oral BID   mirabegron ER  50 mg Oral QHS   mupirocin ointment  1 Application Nasal BID   [START ON 08/26/2022] phenylephrine  30-200 mcg/min Intravenous To OR   polyethylene glycol  17 g Oral Daily   [START ON 08/26/2022] potassium chloride  80 mEq Other To OR   sodium chloride flush  10-40 mL Intracatheter Q12H   [START ON 08/26/2022] tranexamic acid  15 mg/kg (Adjusted) Intravenous To OR   [START ON 08/26/2022] tranexamic acid  2 mg/kg Intracatheter To OR   Continuous Infusions:  sodium chloride     [START ON 08/26/2022]  ceFAZolin (ANCEF) IV     [START ON 08/26/2022]  ceFAZolin (ANCEF) IV     [START ON 08/26/2022] dexmedetomidine     [START ON 08/26/2022] heparin 30,000 units/NS 1000 mL solution for CELLSAVER     heparin 1,300 Units/hr (08/23/22 2135)   [START ON 08/26/2022] milrinone     [START ON 08/26/2022] nitroGLYCERIN     [START ON 08/26/2022] norepinephrine     [START ON 08/26/2022] tranexamic  acid (CYKLOKAPRON) 2,500 mg in sodium chloride 0.9 % 250 mL (10 mg/mL) infusion     [START ON 08/26/2022] vancomycin     PRN Meds: sodium chloride, acetaminophen, ondansetron (ZOFRAN) IV, sodium chloride flush   Vital Signs    Vitals:   08/23/22 0426 08/23/22 1052 08/23/22 1955 08/24/22 0544  BP: 115/64 139/66 (!) 142/62 130/64  Pulse: (!) 49 (!) 59    Resp: 16 17 16 17   Temp: 97.6 F (36.4 C) 98.2 F (36.8 C) 98.8 F (37.1 C) 98 F (36.7 C)  TempSrc: Axillary Oral Oral Axillary  SpO2:  100%    Weight:      Height:        Intake/Output Summary (Last 24 hours) at 08/24/2022 0857 Last data filed at 08/23/2022 2135 Gross per 24 hour  Intake 510 ml  Output --  Net 510 ml       08/20/2022    2:29 PM 08/19/2022    8:24 AM 08/08/2022    1:44 PM  Last 3 Weights  Weight (lbs) 176 lb 12.6 oz 176 lb 12.8 oz 177 lb 2 oz  Weight (kg) 80.19 kg 80.196 kg 80.343 kg      Telemetry    SR to SB - Personally Reviewed  ECG  No new - Personally Reviewed  Physical Exam   GEN: No acute distress.   Neck: No JVD Cardiac: RRR, no murmurs, rubs, or gallops.  Respiratory: Clear to auscultation bilaterally. GI: Soft, nontender, non-distended  MS: No edema; No deformity. Neuro:  Nonfocal  Psych: Normal affect   Labs    High Sensitivity Troponin:  No results for input(s): "TROPONINIHS" in the last 720 hours.   Chemistry Recent Labs  Lab 08/19/22 0957 08/21/22 0301 08/22/22 0527 08/23/22 0500 08/24/22 0520  NA 141 143 139 141 141  K 3.9 4.3 3.9 4.1 4.1  CL 109 111 109 111 111  CO2 25 23 24 25 24   GLUCOSE 114* 112* 120* 105* 111*  BUN 27* 24* 27* 25* 28*  CREATININE 1.07* 1.14* 1.20* 1.14* 1.16*  CALCIUM 9.9 8.9 8.7* 8.9 9.0  PROT 7.5  --   --   --   --   ALBUMIN 4.1 3.4*  --   --   --   AST 20  --   --   --   --   ALT 19  --   --   --   --   ALKPHOS 66  --   --   --   --   BILITOT 0.4  --   --   --   --   GFRNONAA 53* 49* 46* 49* 48*  ANIONGAP 7 9 6 5 6     Lipids   Recent Labs  Lab 08/19/22 0957  CHOL 171  TRIG 160*  HDL 67  LDLCALC 72  CHOLHDL 2.6    Hematology Recent Labs  Lab 08/22/22 0527 08/23/22 0500 08/24/22 0520  WBC 5.7 5.1 5.3  RBC 3.47* 3.38* 3.47*  HGB 10.4* 10.2* 10.3*  HCT 31.7* 30.9* 31.9*  MCV 91.4 91.4 91.9  MCH 30.0 30.2 29.7  MCHC 32.8 33.0 32.3  RDW 13.1 13.1 13.0  PLT 236 223 221   Thyroid  Recent Labs  Lab 08/21/22 0301  TSH 4.751*    BNPNo results for input(s): "BNP", "PROBNP" in the last 168 hours.  DDimer No results for input(s): "DDIMER" in the last 168 hours.   Radiology    VAS US DOPPLER PRE CABG  Result Date: 08/22/2022 PREOPERATIVE VASCULAR EVALUATION Patient Name:  Lisa [name] Fung  Date of Exam:   08/22/2022 Medical Rec #: [id number]         Accession #:    [id number] Date of Birth: [birth year]         Patient Gender: F Patient Age:   38 years Exam Location:  [facility] Procedure:      VAS US DOPPLER PRE CABG Referring Phys: Collier Salina VANTRIGT --------------------------------------------------------------------------------  Indications:      Pre-CABG. Risk Factors:     Hypertension, hyperlipidemia. Comparison Study: No prior study Performing Technologist: Maudry Mayhew MHA, RVT, RDCS, RDMS  Examination Guidelines: A complete evaluation includes B-mode imaging, spectral Doppler, color Doppler, and power Doppler as needed of all accessible portions of each vessel. Bilateral testing is considered an integral part of a complete examination. Limited examinations for reoccurring indications may be performed as noted.  Right Carotid Findings: +----------+--------+--------+--------+---------------------+------------------+           PSV cm/sEDV cm/sStenosisDescribe             Comments           +----------+--------+--------+--------+---------------------+------------------+ CCA Prox  80      14  intimal thickening  +----------+--------+--------+--------+---------------------+------------------+ CCA Distal88      16              focal, smooth and                                                         heterogenous                            +----------+--------+--------+--------+---------------------+------------------+ ICA Prox  68      14              smooth and                                                                heterogenous                            +----------+--------+--------+--------+---------------------+------------------+ ICA Distal98      30                                                      +----------+--------+--------+--------+---------------------+------------------+ ECA       77      11              calcific and focal                      +----------+--------+--------+--------+---------------------+------------------+ +----------+--------+-------+----------------+------------+           PSV cm/sEDV cmsDescribe        Arm Pressure +----------+--------+-------+----------------+------------+ Subclavian95             Multiphasic, WNL             +----------+--------+-------+----------------+------------+ +---------+--------+--+--------+--+---------+ VertebralPSV cm/s42EDV cm/s12Antegrade +---------+--------+--+--------+--+---------+ Left Carotid Findings: +----------+--------+--------+--------+---------------------+------------------+           PSV cm/sEDV cm/sStenosisDescribe             Comments           +----------+--------+--------+--------+---------------------+------------------+ CCA Prox  83      14                                                      +----------+--------+--------+--------+---------------------+------------------+ CCA Distal88      14                                   intimal thickening +----------+--------+--------+--------+---------------------+------------------+ ICA Prox  71      16               smooth, hyperechoic  and calcific                            +----------+--------+--------+--------+---------------------+------------------+ ICA Distal84      28                                                      +----------+--------+--------+--------+---------------------+------------------+ ECA       93                      smooth and                                                                heterogenous                            +----------+--------+--------+--------+---------------------+------------------+ +----------+--------+--------+----------------+------------+ SubclavianPSV cm/sEDV cm/sDescribe        Arm Pressure +----------+--------+--------+----------------+------------+           144             Multiphasic, WNL             +----------+--------+--------+----------------+------------+ +---------+--------+--+--------+--+---------+ VertebralPSV cm/s64EDV cm/s15Antegrade +---------+--------+--+--------+--+---------+  ABI Findings: +--------+------------------+-----+---------+--------+ Right   Rt Pressure (mmHg)IndexWaveform Comment  +--------+------------------+-----+---------+--------+ Brachial                       triphasic         +--------+------------------+-----+---------+--------+ PTA     178               1.09 triphasic         +--------+------------------+-----+---------+--------+ DP      169               1.03 triphasic         +--------+------------------+-----+---------+--------+ +--------+------------------+-----+---------+-------+ Left    Lt Pressure (mmHg)IndexWaveform Comment +--------+------------------+-----+---------+-------+ [id number]                    triphasic        +--------+------------------+-----+---------+-------+ PTA     172               1.05 triphasic        +--------+------------------+-----+---------+-------+  DP      164               1.00 triphasic        +--------+------------------+-----+---------+-------+ +-------+---------------+----------------+ ABI/TBIToday's ABI/TBIPrevious ABI/TBI +-------+---------------+----------------+ Right  1.09                            +-------+---------------+----------------+ Left   1.05                            +-------+---------------+----------------+  Right Doppler Findings: +-----------+--------+-----+---------+--------------------+ Site       PressureIndexDoppler  Comments             +-----------+--------+-----+---------+--------------------+ Brachial                triphasic                     +-----------+--------+-----+---------+--------------------+  Radial                  triphasic                     +-----------+--------+-----+---------+--------------------+ Ulnar                   triphasic                     +-----------+--------+-----+---------+--------------------+ Palmar Arch                      Within normal limits +-----------+--------+-----+---------+--------------------+  Left Doppler Findings: +-----------+--------+-----+---------+--------------------+ Site       PressureIndexDoppler  Comments             +-----------+--------+-----+---------+--------------------+ Brachial   164          triphasic                     +-----------+--------+-----+---------+--------------------+ Radial                  triphasic                     +-----------+--------+-----+---------+--------------------+ Ulnar                   triphasic                     +-----------+--------+-----+---------+--------------------+ Palmar Arch                      Within normal limits +-----------+--------+-----+---------+--------------------+   Summary: Right Carotid: Velocities in the right ICA are consistent with a 1-39% stenosis. Left Carotid: Velocities in the left ICA are consistent with a 1-39% stenosis.  Vertebrals:  Bilateral vertebral arteries demonstrate antegrade flow. Subclavians: Normal flow hemodynamics were seen in bilateral subclavian              arteries. Right ABI: Resting right ankle-brachial index is within normal range. Left ABI: Resting left ankle-brachial index is within normal range. Right Upper Extremity: Doppler waveforms remain within normal limits with right radial compression. Doppler waveforms remain within normal limits with right ulnar compression. Left Upper Extremity: Doppler waveforms decrease <50% w left radial compression. Doppler waveforms decrease <50% with left ulnar compression.  Electronically signed by Lemar Livings MD on 08/22/2022 at 3:09:07 PM.    Final     Cardiac Studies   Cath: 08/20/22     Mid LM to Ost LAD lesion is 90% stenosed.   Ost Cx to Prox Cx lesion is 99% stenosed.   Prox LAD to Mid LAD lesion is 30% stenosed.   Prox RCA lesion is 85% stenosed.   The left ventricular systolic function is normal.   LV end diastolic pressure is mildly elevated.   The left ventricular ejection fraction is 55-65% by visual estimate.   1.  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  The coronary arteries are heavily calcified. 2.  Normal LV systolic function mildly elevated left ventricular end-diastolic pressure.  No significant gradient across aortic valve.   Recommendations: Recommend CABG for revascularization.  Given the patient's symptoms at rest, recommend admission to the hospital and initiation of heparin drip.  Unfortunately, the patient is on clopidogrel therapy as an outpatient and this was discontinued today.  CABG  can likely be performed early next week.  The coronary anatomy is not favorable for PCI.   Diagnostic Dominance: Co-dominant  Echo: 08/20/22   IMPRESSIONS     1. Left ventricular ejection fraction,  by estimation, is 60 to 65%. The  left ventricle has normal function. The left ventricle has no regional  wall motion abnormalities. Left ventricular diastolic parameters are  consistent with Grade I diastolic  dysfunction (impaired relaxation).   2. Right ventricular systolic function is normal. The right ventricular  size is normal. There is normal pulmonary artery systolic pressure. The  estimated right ventricular systolic pressure is [id number] mmHg.   3. The mitral valve is normal in structure. Mild mitral valve  regurgitation. No evidence of mitral stenosis. Moderate mitral annular  calcification.   4. The aortic valve is tricuspid. There is mild calcification of the  aortic valve. There is mild thickening of the aortic valve. Aortic valve  regurgitation is not visualized. Aortic valve sclerosis/calcification is  present, without any evidence of  aortic stenosis.   5. The inferior vena cava is normal in size with greater than 50%  respiratory variability, suggesting right atrial pressure of 3 mmHg.   Comparison(s): No significant change from prior study. Prior images  reviewed side by side.   Patient Profile     81 y.o. female past medical history of CAD, stroke, CKD stage II, hypertension, hyperlipidemia and GERD who was recently seen in the office for complaints of exertional dyspnea and chest discomfort.  Underwent outpatient coronary CTA which was abnormal and presented for cardiac cath and now plans for CABG  Assessment & Plan    CAD Dyspnea on exertion -- Underwent outpatient cardiac catheterization noted above with codominant coronary arteries with severe distal left main bifurcation disease into the ostium of the LAD and left circumflex of 90 and 99%.  Also with 85% proximal RCA lesion.  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

## 2022-08-24 NOTE — Plan of Care (Signed)
  Problem: Education: Goal: Understanding of CV disease, CV risk reduction, and recovery process will improve Outcome: Progressing Goal: Individualized Educational Video(s) Outcome: Progressing   Problem: Activity: Goal: Ability to return to baseline activity level will improve Outcome: Progressing   Problem: Cardiovascular: Goal: Ability to achieve and maintain adequate cardiovascular perfusion will improve Outcome: Progressing Goal: Vascular access site(s) Level 0-1 will be maintained Outcome: Progressing   Problem: Health Behavior/Discharge Planning: Goal: Ability to safely manage health-related needs after discharge will improve Outcome: Progressing   Problem: Education: Goal: Knowledge of General Education information will improve Description: Including pain rating scale, medication(s)/side effects and non-pharmacologic comfort measures Outcome: Progressing   Problem: Health Behavior/Discharge Planning: Goal: Ability to manage health-related needs will improve Outcome: Progressing   Problem: Clinical Measurements: Goal: Ability to maintain clinical measurements within normal limits will improve Outcome: Progressing Goal: Will remain free from infection Outcome: Progressing Goal: Diagnostic test results will improve Outcome: Progressing Goal: Respiratory complications will improve Outcome: Progressing Goal: Cardiovascular complication will be avoided Outcome: Progressing   Problem: Activity: Goal: Risk for activity intolerance will decrease Outcome: Progressing   Problem: Nutrition: Goal: Adequate nutrition will be maintained Outcome: Progressing   Problem: Coping: Goal: Level of anxiety will decrease Outcome: Progressing   Problem: Elimination: Goal: Will not experience complications related to bowel motility Outcome: Progressing Goal: Will not experience complications related to urinary retention Outcome: Progressing   Problem: Pain Managment: Goal:  General experience of comfort will improve Outcome: Progressing   Problem: Safety: Goal: Ability to remain free from injury will improve Outcome: Progressing   Problem: Skin Integrity: Goal: Risk for impaired skin integrity will decrease Outcome: Progressing   Problem: Education: Goal: Will demonstrate proper wound care and an understanding of methods to prevent future damage Outcome: Progressing Goal: Knowledge of disease or condition will improve Outcome: Progressing Goal: Knowledge of the prescribed therapeutic regimen will improve Outcome: Progressing Goal: Individualized Educational Video(s) Outcome: Progressing   Problem: Activity: Goal: Risk for activity intolerance will decrease Outcome: Progressing   Problem: Cardiac: Goal: Will achieve and/or maintain hemodynamic stability Outcome: Progressing   Problem: Clinical Measurements: Goal: Postoperative complications will be avoided or minimized Outcome: Progressing   Problem: Respiratory: Goal: Respiratory status will improve Outcome: Progressing   Problem: Skin Integrity: Goal: Wound healing without signs and symptoms of infection Outcome: Progressing Goal: Risk for impaired skin integrity will decrease Outcome: Progressing   Problem: Urinary Elimination: Goal: Ability to achieve and maintain adequate renal perfusion and functioning will improve Outcome: Progressing   

## 2022-08-25 DIAGNOSIS — I200 Unstable angina: Secondary | ICD-10-CM | POA: Diagnosis not present

## 2022-08-25 DIAGNOSIS — I2510 Atherosclerotic heart disease of native coronary artery without angina pectoris: Secondary | ICD-10-CM | POA: Diagnosis not present

## 2022-08-25 LAB — CBC
HCT: 32.4 % — ABNORMAL LOW (ref 36.0–46.0)
Hemoglobin: 10.8 g/dL — ABNORMAL LOW (ref 12.0–15.0)
MCH: 30.4 pg (ref 26.0–34.0)
MCHC: 33.3 g/dL (ref 30.0–36.0)
MCV: 91.3 fL (ref 80.0–100.0)
Platelets: 214 10*3/uL (ref 150–400)
RBC: 3.55 MIL/uL — ABNORMAL LOW (ref 3.87–5.11)
RDW: 12.9 % (ref 11.5–15.5)
WBC: 5.1 10*3/uL (ref 4.0–10.5)
nRBC: 0 % (ref 0.0–0.2)

## 2022-08-25 LAB — BLOOD GAS, ARTERIAL
Acid-Base Excess: 0.7 mmol/L (ref 0.0–2.0)
Bicarbonate: 24.3 mmol/L (ref 20.0–28.0)
Drawn by: 34762
O2 Saturation: 97.9 %
Patient temperature: 36.5
pCO2 arterial: 34 mmHg (ref 32–48)
pH, Arterial: 7.46 — ABNORMAL HIGH (ref 7.35–7.45)
pO2, Arterial: 161 mmHg — ABNORMAL HIGH (ref 83–108)

## 2022-08-25 LAB — BASIC METABOLIC PANEL
Anion gap: 5 (ref 5–15)
BUN: 27 mg/dL — ABNORMAL HIGH (ref 8–23)
CO2: 22 mmol/L (ref 22–32)
Calcium: 8.7 mg/dL — ABNORMAL LOW (ref 8.9–10.3)
Chloride: 114 mmol/L — ABNORMAL HIGH (ref 98–111)
Creatinine, Ser: 0.99 mg/dL (ref 0.44–1.00)
GFR, Estimated: 58 mL/min — ABNORMAL LOW (ref >60)
Glucose, Bld: 110 mg/dL — ABNORMAL HIGH (ref 70–99)
Potassium: 3.7 mmol/L (ref 3.5–5.1)
Sodium: 141 mmol/L (ref 135–145)

## 2022-08-25 LAB — APTT: aPTT: 85 seconds — ABNORMAL HIGH (ref 24–36)

## 2022-08-25 LAB — PROTIME-INR
INR: 1.1 (ref 0.8–1.2)
Prothrombin Time: 14.1 seconds (ref 11.4–15.2)

## 2022-08-25 LAB — HEPARIN LEVEL (UNFRACTIONATED): Heparin Unfractionated: 0.48 IU/mL (ref 0.30–0.70)

## 2022-08-25 LAB — PREPARE RBC (CROSSMATCH)

## 2022-08-25 MED ORDER — CHLORHEXIDINE GLUCONATE 0.12 % MT SOLN
15.0000 mL | Freq: Once | OROMUCOSAL | Status: AC
  Administered 2022-08-26: 15 mL via OROMUCOSAL
  Filled 2022-08-25: qty 15

## 2022-08-25 MED ORDER — BISACODYL 5 MG PO TBEC: 5.0000 mg | DELAYED_RELEASE_TABLET | Freq: Once | ORAL | Status: DC

## 2022-08-25 MED ORDER — DIAZEPAM 2 MG PO TABS
2.0000 mg | ORAL_TABLET | Freq: Once | ORAL | Status: AC
  Administered 2022-08-26: 2 mg via ORAL
  Filled 2022-08-25: qty 1

## 2022-08-25 MED ORDER — ALPRAZOLAM 0.5 MG PO TABS: 0.5000 mg | ORAL_TABLET | Freq: Three times a day (TID) | ORAL | Status: DC | PRN

## 2022-08-25 MED ORDER — METOPROLOL TARTRATE 12.5 MG HALF TABLET
12.5000 mg | ORAL_TABLET | Freq: Once | ORAL | Status: AC
  Administered 2022-08-26: 12.5 mg via ORAL
  Filled 2022-08-25: qty 1

## 2022-08-25 MED ORDER — CHLORHEXIDINE GLUCONATE 4 % EX LIQD
60.0000 mL | Freq: Once | CUTANEOUS | Status: AC
  Administered 2022-08-25: 4 via TOPICAL
  Filled 2022-08-25: qty 60

## 2022-08-25 MED ORDER — BISACODYL 5 MG PO TBEC
5.0000 mg | DELAYED_RELEASE_TABLET | Freq: Once | ORAL | Status: AC
  Administered 2022-08-25: 5 mg via ORAL
  Filled 2022-08-25: qty 1

## 2022-08-25 MED ORDER — TEMAZEPAM 15 MG PO CAPS: 15.0000 mg | ORAL_CAPSULE | Freq: Once | ORAL | Status: DC | PRN

## 2022-08-25 NOTE — Progress Notes (Signed)
Rounding Note    Patient Name: Lisa [name], Dr. Date of Encounter: 08/25/2022  [facility] Cardiologist: Debbe Odea, MD   Subjective   Getting anxious about surgery in am   Inpatient Medications    Scheduled Meds:  amLODipine  5 mg Oral QHS   aspirin EC  81 mg Oral Daily   atorvastatin  80 mg Oral QHS   Chlorhexidine Gluconate Cloth  6 each Topical Daily   docusate sodium  100 mg Oral Daily   [START ON 08/26/2022] epinephrine  0-10 mcg/min Intravenous To OR   fenofibrate  160 mg Oral Daily   ferrous sulfate  324 mg Oral QODAY   fluticasone  2 spray Each Nare Daily   [START ON 08/26/2022] heparin sodium (porcine) 2,500 Units, papaverine 30 mg in electrolyte-A (PLASMALYTE-A PH 7.4) 500 mL irrigation   Irrigation To OR   [START ON 08/26/2022] insulin   Intravenous To OR   [START ON 08/26/2022] [place] Blood Cardioplegia vial (lidocaine/magnesium/mannitol 0.26g-4g-6.4g)   Intracoronary To OR   melatonin  5 mg Oral QHS   metoprolol tartrate  25 mg Oral BID   mirabegron ER  50 mg Oral QHS   mupirocin ointment  1 Application Nasal BID   [START ON 08/26/2022] phenylephrine  30-200 mcg/min Intravenous To OR   polyethylene glycol  17 g Oral Daily   [START ON 08/26/2022] potassium chloride  80 mEq Other To OR   sodium chloride flush  10-40 mL Intracatheter Q12H   [START ON 08/26/2022] tranexamic acid  15 mg/kg (Adjusted) Intravenous To OR   [START ON 08/26/2022] tranexamic acid  2 mg/kg Intracatheter To OR   Continuous Infusions:  sodium chloride     [START ON 08/26/2022]  ceFAZolin (ANCEF) IV     [START ON 08/26/2022]  ceFAZolin (ANCEF) IV     [START ON 08/26/2022] dexmedetomidine     [START ON 08/26/2022] heparin 30,000 units/NS 1000 mL solution for CELLSAVER     heparin 1,300 Units/hr (08/25/22 0616)   [START ON 08/26/2022] milrinone     [START ON 08/26/2022] nitroGLYCERIN     [START ON 08/26/2022] norepinephrine     [START ON 08/26/2022] tranexamic acid (CYKLOKAPRON) 2,500 mg in  sodium chloride 0.9 % 250 mL (10 mg/mL) infusion     [START ON 08/26/2022] vancomycin     PRN Meds: sodium chloride, acetaminophen, ondansetron (ZOFRAN) IV, sodium chloride flush   Vital Signs    Vitals:   08/24/22 1010 08/24/22 2032 08/25/22 0340 08/25/22 0918  BP: 128/78 (!) 142/69 111/86 139/70  Pulse: 63   61  Resp: 18 17 16    Temp: 98.3 F (36.8 C) 98.1 F (36.7 C) 97.7 F (36.5 C)   TempSrc: Oral Oral Oral   SpO2: 100% 100% 100%   Weight:      Height:        Intake/Output Summary (Last 24 hours) at 08/25/2022 0922 Last data filed at 08/25/2022 0616 Gross per 24 hour  Intake 933.93 ml  Output --  Net 933.93 ml       08/20/2022    2:29 PM 08/19/2022    8:24 AM 08/08/2022    1:44 PM  Last 3 Weights  Weight (lbs) 176 lb 12.6 oz 176 lb 12.8 oz 177 lb 2 oz  Weight (kg) 80.19 kg 80.196 kg 80.343 kg      Telemetry    SR to SB - Personally Reviewed  ECG    No new - Personally Reviewed  Physical  Exam   GEN: No acute distress.   Neck: No JVD Cardiac: RRR, no murmurs, rubs, or gallops.  Respiratory: Clear to auscultation bilaterally. GI: Soft, nontender, non-distended  MS: No edema; No deformity. Neuro:  Nonfocal  Psych: Normal affect   Labs    High Sensitivity Troponin:  No results for input(s): "TROPONINIHS" in the last 720 hours.   Chemistry Recent Labs  Lab 08/19/22 0957 08/21/22 0301 08/22/22 0527 08/23/22 0500 08/24/22 0520 08/25/22 0500  NA 141 143   < > 141 141 141  K 3.9 4.3   < > 4.1 4.1 3.7  CL 109 111   < > 111 111 114*  CO2 25 23   < > 25 24 22   GLUCOSE 114* 112*   < > 105* 111* 110*  BUN 27* 24*   < > 25* 28* 27*  CREATININE 1.07* 1.14*   < > 1.14* 1.16* 0.99  CALCIUM 9.9 8.9   < > 8.9 9.0 8.7*  PROT 7.5  --   --   --   --   --   ALBUMIN 4.1 3.4*  --   --   --   --   AST 20  --   --   --   --   --   ALT 19  --   --   --   --   --   ALKPHOS 66  --   --   --   --   --   BILITOT 0.4  --   --   --   --   --   GFRNONAA 53* 49*   < > 49*  48* 58*  ANIONGAP 7 9   < > 5 6 5    < > = values in this interval not displayed.    Lipids  Recent Labs  Lab 08/19/22 0957  CHOL 171  TRIG 160*  HDL 67  LDLCALC 72  CHOLHDL 2.6    Hematology Recent Labs  Lab 08/23/22 0500 08/24/22 0520 08/25/22 0500  WBC 5.1 5.3 5.1  RBC 3.38* 3.47* 3.55*  HGB 10.2* 10.3* 10.8*  HCT 30.9* 31.9* 32.4*  MCV 91.4 91.9 91.3  MCH 30.2 29.7 30.4  MCHC 33.0 32.3 33.3  RDW 13.1 13.0 12.9  PLT 223 221 214   Thyroid  Recent Labs  Lab 08/21/22 0301  TSH 4.751*    BNPNo results for input(s): "BNP", "PROBNP" in the last 168 hours.  DDimer No results for input(s): "DDIMER" in the last 168 hours.   Radiology    No results found.  Cardiac Studies   Cath: 08/20/22     Mid LM to Ost LAD lesion is 90% stenosed.   Ost Cx to Prox Cx lesion is 99% stenosed.   Prox LAD to Mid LAD lesion is 30% stenosed.   Prox RCA lesion is 85% stenosed.   The left ventricular systolic function is normal.   LV end diastolic pressure is mildly elevated.   The left ventricular ejection fraction is 55-65% by visual estimate.   1.  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  The coronary arteries are heavily calcified. 2.  Normal LV systolic function mildly elevated left ventricular end-diastolic pressure.  No significant gradient across aortic valve.   Recommendations: Recommend CABG for revascularization.  Given the patient's symptoms at rest, recommend admission to the hospital and initiation  of heparin drip.  Unfortunately, the patient is on clopidogrel therapy as an outpatient and this was discontinued today.  CABG can likely be performed early next week.  The coronary anatomy is not favorable for PCI.   Diagnostic Dominance: Co-dominant  Echo: 08/20/22   IMPRESSIONS     1. Left ventricular ejection fraction, by  estimation, is 60 to 65%. The  left ventricle has normal function. The left ventricle has no regional  wall motion abnormalities. Left ventricular diastolic parameters are  consistent with Grade I diastolic  dysfunction (impaired relaxation).   2. Right ventricular systolic function is normal. The right ventricular  size is normal. There is normal pulmonary artery systolic pressure. The  estimated right ventricular systolic pressure is [id number] mmHg.   3. The mitral valve is normal in structure. Mild mitral valve  regurgitation. No evidence of mitral stenosis. Moderate mitral annular  calcification.   4. The aortic valve is tricuspid. There is mild calcification of the  aortic valve. There is mild thickening of the aortic valve. Aortic valve  regurgitation is not visualized. Aortic valve sclerosis/calcification is  present, without any evidence of  aortic stenosis.   5. The inferior vena cava is normal in size with greater than 50%  respiratory variability, suggesting right atrial pressure of 3 mmHg.   Comparison(s): No significant change from prior study. Prior images  reviewed side by side.   Patient Profile     81 y.o. female past medical history of CAD, stroke, CKD stage II, hypertension, hyperlipidemia and GERD who was recently seen in the office for complaints of exertional dyspnea and chest discomfort.  Underwent outpatient coronary CTA which was abnormal and presented for cardiac cath and now plans for CABG  Assessment & Plan    CAD Dyspnea on exertion -- Underwent outpatient cardiac catheterization noted above with codominant coronary arteries with severe distal left main bifurcation disease into the ostium of the LAD and left circumflex of 90 and 99%.  Also with 85% proximal RCA lesion.  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

## 2022-08-25 NOTE — Anesthesia Preprocedure Evaluation (Addendum)
Anesthesia Evaluation  Patient identified by MRN, date of birth, ID band Patient awake    Reviewed: Allergy & Precautions, NPO status , Patient's Chart, lab work & pertinent test results, reviewed documented beta blocker date and time   History of Anesthesia Complications (+) PONV and history of anesthetic complications  Airway Mallampati: III  TM Distance: >3 FB Neck ROM: Full    Dental  (+) Teeth Intact, Dental Advisory Given Upper and lower bridge :   Pulmonary asthma , sleep apnea and Continuous Positive Airway Pressure Ventilation ,    Pulmonary exam normal breath sounds clear to auscultation       Cardiovascular hypertension, Pt. on medications + angina + CAD  Normal cardiovascular exam Rhythm:Regular Rate:Normal  Echo 08/20/22: 1. Left ventricular ejection fraction, by estimation, is 60 to 65%. The  left ventricle has normal function. The left ventricle has no regional  wall motion abnormalities. Left ventricular diastolic parameters are  consistent with Grade I diastolic  dysfunction (impaired relaxation).  2. Right ventricular systolic function is normal. The right ventricular  size is normal. There is normal pulmonary artery systolic pressure. The  estimated right ventricular systolic pressure is 27.2 mmHg.  3. The mitral valve is normal in structure. Mild mitral valve  regurgitation. No evidence of mitral stenosis. Moderate mitral annular  calcification.  4. The aortic valve is tricuspid. There is mild calcification of the  aortic valve. There is mild thickening of the aortic valve. Aortic valve  regurgitation is not visualized. Aortic valve sclerosis/calcification is  present, without any evidence of  aortic stenosis.  5. The inferior vena cava is normal in size with greater than 50%  respiratory variability, suggesting right atrial pressure of 3 mmHg.    Neuro/Psych  Headaches, CVA    GI/Hepatic Neg liver  ROS, GERD  ,  Endo/Other  Obesity   Renal/GU Renal InsufficiencyRenal disease     Musculoskeletal  (+) Arthritis ,   Abdominal   Peds  Hematology  (+) Blood dyscrasia (Plavix), anemia ,   Anesthesia Other Findings   Reproductive/Obstetrics                            Anesthesia Physical Anesthesia Plan  ASA: 4  Anesthesia Plan: General   Post-op Pain Management:    Induction: Intravenous  PONV Risk Score and Plan: 4 or greater and Dexamethasone, Ondansetron and Midazolam  Airway Management Planned: Oral ETT  Additional Equipment: Arterial line, CVP, PA Cath, TEE and Ultrasound Guidance Line Placement  Intra-op Plan:   Post-operative Plan: Post-operative intubation/ventilation  Informed Consent: I have reviewed the patients History and Physical, chart, labs and discussed the procedure including the risks, benefits and alternatives for the proposed anesthesia with the patient or authorized representative who has indicated his/her understanding and acceptance.     Dental advisory given  Plan Discussed with: CRNA  Anesthesia Plan Comments:        Anesthesia Quick Evaluation

## 2022-08-25 NOTE — Progress Notes (Signed)
ANTICOAGULATION CONSULT NOTE- Follow Up  Pharmacy Consult for heparin  Indication: chest pain/ACS  Allergies  Allergen Reactions   Other Other (See Comments)    Anesthesia causes nausea and vomiting, memory difficulty and problems processing.   Oxycodone Nausea Only   Sulfa Antibiotics Rash   Sulfasalazine Rash    Patient Measurements: Heparin dosing weight= 70kg  Vital Signs: Temp: 97.7 F (36.5 C) (09/04 0340) Temp Source: Oral (09/04 0340) BP: 111/86 (09/04 0340)  Labs: Recent Labs    08/23/22 0500 08/24/22 0500 08/24/22 0520 08/25/22 0500  HGB 10.2*  --  10.3* 10.8*  HCT 30.9*  --  31.9* 32.4*  PLT 223  --  221 214  APTT  --   --   --  85*  LABPROT  --   --   --  14.1  INR  --   --   --  1.1  HEPARINUNFRC 0.46 0.50  --  0.48  CREATININE 1.14*  --  1.16* 0.99     Estimated Creatinine Clearance: 45.4 mL/min (by C-G formula based on SCr of 0.99 mg/dL).   Medical History: Past Medical History:  Diagnosis Date   Acne rosacea    Allergic rhinitis    Chronic kidney disease    kidney stones, Nov. 2013- tx with Lithotripsy, still has some present    Cough variant asthma    Degenerative disc disease    knees, ankles, back- OA   GERD (gastroesophageal reflux disease)    Headache(784.0)    since stopping aleve- 07/30/2013   Hyperlipemia    dyslipidemia   Hypertension    [facility] cardiac pharmacist follows pt. Riki Rusk Smart ,has never taken anti-hypertensive   Osteoarthritis    Plantar fasciitis    (using orthotics-they are wearing out)- Dr Luther Bradley   Pneumonia    double pneumonia - relative to sinus infection , had chemical cauterization    PONV (postoperative nausea and vomiting)    urgency, N&V for days, memory & processing   Sleep apnea    Bringing cpap mask and tubing, study last done- 2006    Medications:  Medications Prior to Admission  Medication Sig Dispense Refill Last Dose   acetaminophen (TYLENOL) 500 MG tablet Take 1,000 mg by mouth See  admin instructions. 1000 mg in the morning and at night  Additional 1000 mg daily at mid day if needed.      alendronate (FOSAMAX) 70 MG tablet Take 70 mg by mouth once a week. Take with a full glass of water on an empty stomach.  Saturday Morning   Past Week   amLODipine (NORVASC) 5 MG tablet Take 5 mg by mouth at bedtime.   08/19/2022   atorvastatin (LIPITOR) 40 MG tablet Take 1 tablet (40 mg total) by mouth at bedtime. 90 tablet 0 08/19/2022   Cholecalciferol (VITAMIN D-3) 125 MCG (5000 UT) TABS Take 5,000 Units by mouth daily.   08/19/2022   clopidogrel (PLAVIX) 75 MG tablet Take 1 tablet (75 mg total) by mouth daily. 90 tablet 0 08/20/2022 at 0530   Cobalamin Combinations (NEURIVA PLUS PO) Take 1 capsule by mouth daily.   08/19/2022   fenofibrate (TRICOR) 145 MG tablet Take 1 tablet by mouth daily, need MD visit for further refills (Patient taking differently: Take 145 mg by mouth at bedtime.) 90 tablet 0 08/19/2022   ferrous sulfate 324 MG TBEC Take 324 mg by mouth every other day.   08/19/2022   FIBER PO Take 1 tablet by mouth  every morning. Fiber well gummy   08/19/2022   fluticasone (FLONASE) 50 MCG/ACT nasal spray Place 2 sprays into the nose daily. Allergic rhinitis.   08/20/2022 at 0530   hydrochlorothiazide (HYDRODIURIL) 12.5 MG tablet Take 12.5 mg by mouth daily.   08/19/2022   Magnesium 250 MG TABS Take 250 mg by mouth at bedtime.   08/19/2022   Multiple Vitamin (MULTIVITAMIN WITH MINERALS) TABS Take 1 tablet by mouth daily. Centrum  Woman 50+   08/19/2022   Multiple Vitamins-Minerals (EMERGEN-C IMMUNE PO) Take 1,500 mg by mouth daily. 750 mg each gummy   08/19/2022   MYRBETRIQ 50 MG TB24 tablet Take 50 mg by mouth at bedtime.   08/19/2022   OVER THE COUNTER MEDICATION Take 1 tablet by mouth daily. Nervive      Polyethyl Glycol-Propyl Glycol (SYSTANE OP) Place 1 drop into both eyes daily as needed (Dry eyes).      TURMERIC PO Take 1,000 mg by mouth 2 (two) times daily.   08/19/2022   UNABLE TO  FIND Take 1 tablet by mouth daily. GOLI Applecider Vinegar /Gummy   Past Month   Scheduled:   amLODipine  5 mg Oral QHS   aspirin EC  81 mg Oral Daily   atorvastatin  80 mg Oral QHS   Chlorhexidine Gluconate Cloth  6 each Topical Daily   docusate sodium  100 mg Oral Daily   [START ON 08/26/2022] epinephrine  0-10 mcg/min Intravenous To OR   fenofibrate  160 mg Oral Daily   ferrous sulfate  324 mg Oral QODAY   fluticasone  2 spray Each Nare Daily   [START ON 08/26/2022] heparin sodium (porcine) 2,500 Units, papaverine 30 mg in electrolyte-A (PLASMALYTE-A PH 7.4) 500 mL irrigation   Irrigation To OR   [START ON 08/26/2022] insulin   Intravenous To OR   [START ON 08/26/2022] [place] Blood Cardioplegia vial (lidocaine/magnesium/mannitol 0.26g-4g-6.4g)   Intracoronary To OR   melatonin  5 mg Oral QHS   metoprolol tartrate  25 mg Oral BID   mirabegron ER  50 mg Oral QHS   mupirocin ointment  1 Application Nasal BID   [START ON 08/26/2022] phenylephrine  30-200 mcg/min Intravenous To OR   polyethylene glycol  17 g Oral Daily   [START ON 08/26/2022] potassium chloride  80 mEq Other To OR   sodium chloride flush  10-40 mL Intracatheter Q12H   [START ON 08/26/2022] tranexamic acid  15 mg/kg (Adjusted) Intravenous To OR   [START ON 08/26/2022] tranexamic acid  2 mg/kg Intracatheter To OR    Assessment 81 yo female s/p cath with left main disease for possible CABG. Pharmacy to start heparin 8 hours after sheath removal (removed ~ 10:30am). She was not on anticoagulation at home.   Patient multivessel CABG planned for 9/5- needs clopidogrel washout - last dose 8/29  Heparin drip 1300 uts/hr heparin level 0.48 this am at goal Cbc stable, Hgb trending up Spoke with the nurse, no issues with the infusion running or signs/symptoms of bleeding  Goal of Therapy:  Heparin level 0.3-0.7 units/ml Monitor platelets by anticoagulation protocol: Yes   Plan:  -Continue heparin gtt to 1300 units/hr  -heparin level  and CBC in am -Monitor for signs/symptoms of bleeding  Arabella Merles, PharmD. [facility] Acute Care PGY-1 08/25/2022 7:34 AM

## 2022-08-25 NOTE — Progress Notes (Signed)
5 Days Post-Op Procedure(s) (LRB): LEFT HEART CATH AND CORONARY ANGIOGRAPHY (N/A) Subjective: Ready for surgery in am Walked in hall yesterday Remains asymptomatic on iv heparin Objective: Vital signs in last 24 hours: Temp:  [97.7 F (36.5 C)-98.3 F (36.8 C)] 97.7 F (36.5 C) (09/04 0340) Pulse Rate:  [61-63] 61 (09/04 0918) Cardiac Rhythm: Sinus bradycardia;Normal sinus rhythm (09/03 2032) Resp:  [16-18] 16 (09/04 0340) BP: (111-142)/(69-86) 139/70 (09/04 0918) SpO2:  [100 %] 100 % (09/04 0340)  Hemodynamic parameters for last 24 hours:  stable  Intake/Output from previous day: 09/03 0701 - 09/04 0700 In: 933.9 [I.V.:933.9] Out: -  Intake/Output this shift: No intake/output data recorded.       Exam    General- alert and comfortable    Neck- no JVD, no cervical adenopathy palpable, no carotid bruit   Lungs- clear without rales, wheezes   Cor- regular rate and rhythm, no murmur , gallop   Abdomen- soft, non-tender   Extremities - warm, non-tender, minimal edema   Neuro- oriented, appropriate, no focal weakness   Lab Results: Recent Labs    08/24/22 0520 08/25/22 0500  WBC 5.3 5.1  HGB 10.3* 10.8*  HCT 31.9* 32.4*  PLT 221 214   BMET:  Recent Labs    08/24/22 0520 08/25/22 0500  NA 141 141  K 4.1 3.7  CL 111 114*  CO2 24 22  GLUCOSE 111* 110*  BUN 28* 27*  CREATININE 1.16* 0.99  CALCIUM 9.0 8.7*    PT/INR:  Recent Labs    08/25/22 0500  LABPROT 14.1  INR 1.1   ABG    Component Value Date/Time   PHART 7.46 (H) 08/25/2022 0555   HCO3 24.3 08/25/2022 0555   O2SAT 97.9 08/25/2022 0555   CBG (last 3)  No results for input(s): "GLUCAP" in the last 72 hours.  Assessment/Plan: S/P Procedure(s) (LRB): LEFT HEART CATH AND CORONARY ANGIOGRAPHY (N/A) CABGx3 in am Benefits and risks d/w patient   LOS: 5 days    Lovett Sox 08/25/2022

## 2022-08-25 NOTE — Progress Notes (Signed)
Mobility Specialist Progress Note:   08/25/22 0937  Mobility  Activity Ambulated with assistance in hallway  Level of Assistance Modified independent, requires aide device or extra time  Assistive Device  (IV pole)  Distance Ambulated (ft) 550 ft  Activity Response Tolerated well  $Mobility charge 1 Mobility   Pt received in chair willing to participate in mobility. Upon returning to room pt felt SOB, educated pt on pursed lip breathing. Left EOB with call bell in reach and all needs met.   [facility] Lisa [name] Mobility Specialist

## 2022-08-26 ENCOUNTER — Inpatient Hospital Stay (HOSPITAL_COMMUNITY): Payer: Medicare Other | Admitting: Vascular Surgery

## 2022-08-26 ENCOUNTER — Inpatient Hospital Stay (HOSPITAL_COMMUNITY): Payer: Medicare Other

## 2022-08-26 ENCOUNTER — Encounter (HOSPITAL_COMMUNITY): Payer: Self-pay | Admitting: Cardiovascular Disease

## 2022-08-26 ENCOUNTER — Other Ambulatory Visit: Payer: Self-pay

## 2022-08-26 ENCOUNTER — Inpatient Hospital Stay (HOSPITAL_COMMUNITY)
Admission: AD | Disposition: A | Discharge status: Skilled Nursing Facility | Payer: Self-pay | Source: Home / Self Care | Attending: Cardiothoracic Surgery

## 2022-08-26 DIAGNOSIS — Z9989 Dependence on other enabling machines and devices: Secondary | ICD-10-CM | POA: Diagnosis not present

## 2022-08-26 DIAGNOSIS — G4733 Obstructive sleep apnea (adult) (pediatric): Secondary | ICD-10-CM | POA: Diagnosis not present

## 2022-08-26 DIAGNOSIS — I25119 Atherosclerotic heart disease of native coronary artery with unspecified angina pectoris: Secondary | ICD-10-CM

## 2022-08-26 DIAGNOSIS — I10 Essential (primary) hypertension: Secondary | ICD-10-CM

## 2022-08-26 DIAGNOSIS — Z951 Presence of aortocoronary bypass graft: Secondary | ICD-10-CM

## 2022-08-26 DIAGNOSIS — I2510 Atherosclerotic heart disease of native coronary artery without angina pectoris: Secondary | ICD-10-CM | POA: Diagnosis not present

## 2022-08-26 HISTORY — PX: CORONARY ARTERY BYPASS GRAFT: SHX141

## 2022-08-26 HISTORY — PX: TEE WITHOUT CARDIOVERSION: SHX5443

## 2022-08-26 LAB — POCT I-STAT 7, (LYTES, BLD GAS, ICA,H+H)
Acid-Base Excess: 1 mmol/L (ref 0.0–2.0)
Acid-Base Excess: 0 mmol/L (ref 0.0–2.0)
Acid-Base Excess: 1 mmol/L (ref 0.0–2.0)
Acid-Base Excess: 0 mmol/L (ref 0.0–2.0)
Acid-base deficit: 3 mmol/L — ABNORMAL HIGH (ref 0.0–2.0)
Acid-base deficit: 2 mmol/L (ref 0.0–2.0)
Bicarbonate: 23 mmol/L (ref 20.0–28.0)
Bicarbonate: 25.5 mmol/L (ref 20.0–28.0)
Bicarbonate: 23.8 mmol/L (ref 20.0–28.0)
Bicarbonate: 25.1 mmol/L (ref 20.0–28.0)
Bicarbonate: 23.1 mmol/L (ref 20.0–28.0)
Bicarbonate: 24.2 mmol/L (ref 20.0–28.0)
Calcium, Ion: 1.27 mmol/L (ref 1.15–1.40)
Calcium, Ion: 1.01 mmol/L — ABNORMAL LOW (ref 1.15–1.40)
Calcium, Ion: 0.99 mmol/L — ABNORMAL LOW (ref 1.15–1.40)
Calcium, Ion: 1.04 mmol/L — ABNORMAL LOW (ref 1.15–1.40)
Calcium, Ion: 1.04 mmol/L — ABNORMAL LOW (ref 1.15–1.40)
Calcium, Ion: 1.14 mmol/L — ABNORMAL LOW (ref 1.15–1.40)
HCT: 33 % — ABNORMAL LOW (ref 36.0–46.0)
HCT: 23 % — ABNORMAL LOW (ref 36.0–46.0)
HCT: 22 % — ABNORMAL LOW (ref 36.0–46.0)
HCT: 26 % — ABNORMAL LOW (ref 36.0–46.0)
HCT: 24 % — ABNORMAL LOW (ref 36.0–46.0)
HCT: 27 % — ABNORMAL LOW (ref 36.0–46.0)
Hemoglobin: 11.2 g/dL — ABNORMAL LOW (ref 12.0–15.0)
Hemoglobin: 7.8 g/dL — ABNORMAL LOW (ref 12.0–15.0)
Hemoglobin: 7.5 g/dL — ABNORMAL LOW (ref 12.0–15.0)
Hemoglobin: 8.8 g/dL — ABNORMAL LOW (ref 12.0–15.0)
Hemoglobin: 8.2 g/dL — ABNORMAL LOW (ref 12.0–15.0)
Hemoglobin: 9.2 g/dL — ABNORMAL LOW (ref 12.0–15.0)
O2 Saturation: 100 %
O2 Saturation: 100 %
O2 Saturation: 100 %
O2 Saturation: 100 %
O2 Saturation: 99 %
O2 Saturation: 98 %
Potassium: 3.9 mmol/L (ref 3.5–5.1)
Potassium: 4.4 mmol/L (ref 3.5–5.1)
Potassium: 5.3 mmol/L — ABNORMAL HIGH (ref 3.5–5.1)
Potassium: 5.3 mmol/L — ABNORMAL HIGH (ref 3.5–5.1)
Potassium: 5.7 mmol/L — ABNORMAL HIGH (ref 3.5–5.1)
Potassium: 5.7 mmol/L — ABNORMAL HIGH (ref 3.5–5.1)
Sodium: 141 mmol/L (ref 135–145)
Sodium: 139 mmol/L (ref 135–145)
Sodium: 137 mmol/L (ref 135–145)
Sodium: 138 mmol/L (ref 135–145)
Sodium: 137 mmol/L (ref 135–145)
Sodium: 137 mmol/L (ref 135–145)
TCO2: 24 mmol/L (ref 22–32)
TCO2: 27 mmol/L (ref 22–32)
TCO2: 25 mmol/L (ref 22–32)
TCO2: 26 mmol/L (ref 22–32)
TCO2: 24 mmol/L (ref 22–32)
TCO2: 25 mmol/L (ref 22–32)
pCO2 arterial: 43.9 mmHg (ref 32–48)
pCO2 arterial: 38.1 mmHg (ref 32–48)
pCO2 arterial: 31.8 mmHg — ABNORMAL LOW (ref 32–48)
pCO2 arterial: 37.3 mmHg (ref 32–48)
pCO2 arterial: 40.4 mmHg (ref 32–48)
pCO2 arterial: 38.5 mmHg (ref 32–48)
pH, Arterial: 7.327 — ABNORMAL LOW (ref 7.35–7.45)
pH, Arterial: 7.433 (ref 7.35–7.45)
pH, Arterial: 7.436 (ref 7.35–7.45)
pH, Arterial: 7.483 — ABNORMAL HIGH (ref 7.35–7.45)
pH, Arterial: 7.366 (ref 7.35–7.45)
pH, Arterial: 7.407 (ref 7.35–7.45)
pO2, Arterial: 337 mmHg — ABNORMAL HIGH (ref 83–108)
pO2, Arterial: 432 mmHg — ABNORMAL HIGH (ref 83–108)
pO2, Arterial: 312 mmHg — ABNORMAL HIGH (ref 83–108)
pO2, Arterial: 446 mmHg — ABNORMAL HIGH (ref 83–108)
pO2, Arterial: 135 mmHg — ABNORMAL HIGH (ref 83–108)
pO2, Arterial: 96 mmHg (ref 83–108)

## 2022-08-26 LAB — POCT I-STAT, CHEM 8
BUN: 26 mg/dL — ABNORMAL HIGH (ref 8–23)
BUN: 41 mg/dL — ABNORMAL HIGH (ref 8–23)
BUN: 25 mg/dL — ABNORMAL HIGH (ref 8–23)
BUN: 24 mg/dL — ABNORMAL HIGH (ref 8–23)
BUN: 32 mg/dL — ABNORMAL HIGH (ref 8–23)
Calcium, Ion: 1.27 mmol/L (ref 1.15–1.40)
Calcium, Ion: 1.22 mmol/L (ref 1.15–1.40)
Calcium, Ion: 1.08 mmol/L — ABNORMAL LOW (ref 1.15–1.40)
Calcium, Ion: 1.05 mmol/L — ABNORMAL LOW (ref 1.15–1.40)
Calcium, Ion: 1.16 mmol/L (ref 1.15–1.40)
Chloride: 107 mmol/L (ref 98–111)
Chloride: 108 mmol/L (ref 98–111)
Chloride: 105 mmol/L (ref 98–111)
Chloride: 105 mmol/L (ref 98–111)
Chloride: 105 mmol/L (ref 98–111)
Creatinine, Ser: 1 mg/dL (ref 0.44–1.00)
Creatinine, Ser: 1 mg/dL (ref 0.44–1.00)
Creatinine, Ser: 0.8 mg/dL (ref 0.44–1.00)
Creatinine, Ser: 0.8 mg/dL (ref 0.44–1.00)
Creatinine, Ser: 0.9 mg/dL (ref 0.44–1.00)
Glucose, Bld: 121 mg/dL — ABNORMAL HIGH (ref 70–99)
Glucose, Bld: 110 mg/dL — ABNORMAL HIGH (ref 70–99)
Glucose, Bld: 132 mg/dL — ABNORMAL HIGH (ref 70–99)
Glucose, Bld: 148 mg/dL — ABNORMAL HIGH (ref 70–99)
Glucose, Bld: 178 mg/dL — ABNORMAL HIGH (ref 70–99)
HCT: 33 % — ABNORMAL LOW (ref 36.0–46.0)
HCT: 30 % — ABNORMAL LOW (ref 36.0–46.0)
HCT: 26 % — ABNORMAL LOW (ref 36.0–46.0)
HCT: 23 % — ABNORMAL LOW (ref 36.0–46.0)
HCT: 26 % — ABNORMAL LOW (ref 36.0–46.0)
Hemoglobin: 11.2 g/dL — ABNORMAL LOW (ref 12.0–15.0)
Hemoglobin: 10.2 g/dL — ABNORMAL LOW (ref 12.0–15.0)
Hemoglobin: 8.8 g/dL — ABNORMAL LOW (ref 12.0–15.0)
Hemoglobin: 7.8 g/dL — ABNORMAL LOW (ref 12.0–15.0)
Hemoglobin: 8.8 g/dL — ABNORMAL LOW (ref 12.0–15.0)
Potassium: 3.9 mmol/L (ref 3.5–5.1)
Potassium: 5 mmol/L (ref 3.5–5.1)
Potassium: 5.3 mmol/L — ABNORMAL HIGH (ref 3.5–5.1)
Potassium: 5.7 mmol/L — ABNORMAL HIGH (ref 3.5–5.1)
Potassium: 5.7 mmol/L — ABNORMAL HIGH (ref 3.5–5.1)
Sodium: 142 mmol/L (ref 135–145)
Sodium: 139 mmol/L (ref 135–145)
Sodium: 137 mmol/L (ref 135–145)
Sodium: 137 mmol/L (ref 135–145)
Sodium: 138 mmol/L (ref 135–145)
TCO2: 23 mmol/L (ref 22–32)
TCO2: 24 mmol/L (ref 22–32)
TCO2: 26 mmol/L (ref 22–32)
TCO2: 22 mmol/L (ref 22–32)
TCO2: 23 mmol/L (ref 22–32)

## 2022-08-26 LAB — BASIC METABOLIC PANEL
Anion gap: 13 (ref 5–15)
Anion gap: 9 (ref 5–15)
BUN: 28 mg/dL — ABNORMAL HIGH (ref 8–23)
BUN: 23 mg/dL (ref 8–23)
CO2: 23 mmol/L (ref 22–32)
CO2: 20 mmol/L — ABNORMAL LOW (ref 22–32)
Calcium: 9.9 mg/dL (ref 8.9–10.3)
Calcium: 7.9 mg/dL — ABNORMAL LOW (ref 8.9–10.3)
Chloride: 107 mmol/L (ref 98–111)
Chloride: 110 mmol/L (ref 98–111)
Creatinine, Ser: 1.16 mg/dL — ABNORMAL HIGH (ref 0.44–1.00)
Creatinine, Ser: 1.08 mg/dL — ABNORMAL HIGH (ref 0.44–1.00)
GFR, Estimated: 48 mL/min — ABNORMAL LOW (ref >60)
GFR, Estimated: 52 mL/min — ABNORMAL LOW (ref >60)
Glucose, Bld: 117 mg/dL — ABNORMAL HIGH (ref 70–99)
Glucose, Bld: 141 mg/dL — ABNORMAL HIGH (ref 70–99)
Potassium: 4.2 mmol/L (ref 3.5–5.1)
Potassium: 4.1 mmol/L (ref 3.5–5.1)
Sodium: 143 mmol/L (ref 135–145)
Sodium: 139 mmol/L (ref 135–145)

## 2022-08-26 LAB — CBC
HCT: 35.9 % — ABNORMAL LOW (ref 36.0–46.0)
HCT: 28.1 % — ABNORMAL LOW (ref 36.0–46.0)
HCT: 29.3 % — ABNORMAL LOW (ref 36.0–46.0)
Hemoglobin: 11.9 g/dL — ABNORMAL LOW (ref 12.0–15.0)
Hemoglobin: 9.3 g/dL — ABNORMAL LOW (ref 12.0–15.0)
Hemoglobin: 10.1 g/dL — ABNORMAL LOW (ref 12.0–15.0)
MCH: 30.1 pg (ref 26.0–34.0)
MCH: 29.9 pg (ref 26.0–34.0)
MCH: 30.5 pg (ref 26.0–34.0)
MCHC: 33.1 g/dL (ref 30.0–36.0)
MCHC: 33.1 g/dL (ref 30.0–36.0)
MCHC: 34.5 g/dL (ref 30.0–36.0)
MCV: 90.9 fL (ref 80.0–100.0)
MCV: 90.4 fL (ref 80.0–100.0)
MCV: 88.5 fL (ref 80.0–100.0)
Platelets: 229 10*3/uL (ref 150–400)
Platelets: 135 10*3/uL — ABNORMAL LOW (ref 150–400)
Platelets: 153 10*3/uL (ref 150–400)
RBC: 3.95 MIL/uL (ref 3.87–5.11)
RBC: 3.11 MIL/uL — ABNORMAL LOW (ref 3.87–5.11)
RBC: 3.31 MIL/uL — ABNORMAL LOW (ref 3.87–5.11)
RDW: 13 % (ref 11.5–15.5)
RDW: 13.3 % (ref 11.5–15.5)
RDW: 13.4 % (ref 11.5–15.5)
WBC: 6.6 10*3/uL (ref 4.0–10.5)
WBC: 9.1 10*3/uL (ref 4.0–10.5)
WBC: 10 10*3/uL (ref 4.0–10.5)
nRBC: 0 % (ref 0.0–0.2)
nRBC: 0 % (ref 0.0–0.2)
nRBC: 0 % (ref 0.0–0.2)

## 2022-08-26 LAB — PROTIME-INR
INR: 1.5 — ABNORMAL HIGH (ref 0.8–1.2)
Prothrombin Time: 17.9 seconds — ABNORMAL HIGH (ref 11.4–15.2)

## 2022-08-26 LAB — GLUCOSE, CAPILLARY
Glucose-Capillary: 120 mg/dL — ABNORMAL HIGH (ref 70–99)
Glucose-Capillary: 158 mg/dL — ABNORMAL HIGH (ref 70–99)
Glucose-Capillary: 163 mg/dL — ABNORMAL HIGH (ref 70–99)
Glucose-Capillary: 143 mg/dL — ABNORMAL HIGH (ref 70–99)
Glucose-Capillary: 152 mg/dL — ABNORMAL HIGH (ref 70–99)
Glucose-Capillary: 151 mg/dL — ABNORMAL HIGH (ref 70–99)
Glucose-Capillary: 140 mg/dL — ABNORMAL HIGH (ref 70–99)
Glucose-Capillary: 155 mg/dL — ABNORMAL HIGH (ref 70–99)
Glucose-Capillary: 172 mg/dL — ABNORMAL HIGH (ref 70–99)
Glucose-Capillary: 170 mg/dL — ABNORMAL HIGH (ref 70–99)

## 2022-08-26 LAB — POCT I-STAT EG7
Acid-base deficit: 2 mmol/L (ref 0.0–2.0)
Bicarbonate: 22.2 mmol/L (ref 20.0–28.0)
Calcium, Ion: 1.06 mmol/L — ABNORMAL LOW (ref 1.15–1.40)
HCT: 23 % — ABNORMAL LOW (ref 36.0–46.0)
Hemoglobin: 7.8 g/dL — ABNORMAL LOW (ref 12.0–15.0)
O2 Saturation: 80 %
Potassium: 4.1 mmol/L (ref 3.5–5.1)
Sodium: 141 mmol/L (ref 135–145)
TCO2: 23 mmol/L (ref 22–32)
pCO2, Ven: 36.8 mmHg — ABNORMAL LOW (ref 44–60)
pH, Ven: 7.389 (ref 7.25–7.43)
pO2, Ven: 44 mmHg (ref 32–45)

## 2022-08-26 LAB — PREPARE RBC (CROSSMATCH)

## 2022-08-26 LAB — HEMOGLOBIN AND HEMATOCRIT, BLOOD
HCT: 22.6 % — ABNORMAL LOW (ref 36.0–46.0)
Hemoglobin: 7.6 g/dL — ABNORMAL LOW (ref 12.0–15.0)

## 2022-08-26 LAB — PLATELET COUNT: Platelets: 132 10*3/uL — ABNORMAL LOW (ref 150–400)

## 2022-08-26 LAB — HEPARIN LEVEL (UNFRACTIONATED): Heparin Unfractionated: 0.58 IU/mL (ref 0.30–0.70)

## 2022-08-26 LAB — MAGNESIUM: Magnesium: 3 mg/dL — ABNORMAL HIGH (ref 1.7–2.4)

## 2022-08-26 LAB — APTT: aPTT: 31 seconds (ref 24–36)

## 2022-08-26 SURGERY — CORONARY ARTERY BYPASS GRAFTING (CABG)
Anesthesia: General | Site: Chest

## 2022-08-26 MED ORDER — BISACODYL 10 MG RE SUPP: 10.0000 mg | Freq: Every day | RECTAL | Status: DC

## 2022-08-26 MED ORDER — PLASMA-LYTE A IV SOLN
INTRAVENOUS | Status: DC | PRN
  Administered 2022-08-26: 500 mL via INTRAVASCULAR

## 2022-08-26 MED ORDER — INSULIN REGULAR(HUMAN) IN NACL 100-0.9 UT/100ML-% IV SOLN: INTRAVENOUS | Status: DC

## 2022-08-26 MED ORDER — SODIUM CHLORIDE 0.9% FLUSH: 3.0000 mL | INTRAVENOUS | Status: DC | PRN

## 2022-08-26 MED ORDER — CHLORHEXIDINE GLUCONATE 0.12 % MT SOLN
15.0000 mL | OROMUCOSAL | Status: AC
  Administered 2022-08-26: 15 mL via OROMUCOSAL
  Filled 2022-08-26: qty 15

## 2022-08-26 MED ORDER — FENTANYL CITRATE (PF) 250 MCG/5ML IJ SOLN
INTRAMUSCULAR | Status: AC
  Filled 2022-08-26: qty 5

## 2022-08-26 MED ORDER — HEPARIN SODIUM (PORCINE) 1000 UNIT/ML IJ SOLN
INTRAMUSCULAR | Status: AC
  Filled 2022-08-26: qty 1

## 2022-08-26 MED ORDER — TRAMADOL HCL 50 MG PO TABS
50.0000 mg | ORAL_TABLET | ORAL | Status: DC | PRN
  Administered 2022-08-26 – 2022-08-31 (×11): 50 mg via ORAL
  Filled 2022-08-26 (×12): qty 1

## 2022-08-26 MED ORDER — METOPROLOL TARTRATE 5 MG/5ML IV SOLN
2.5000 mg | INTRAVENOUS | Status: DC | PRN
  Administered 2022-08-26: 5 mg via INTRAVENOUS
  Administered 2022-08-27: 2.5 mg via INTRAVENOUS
  Administered 2022-08-27: 5 mg via INTRAVENOUS
  Filled 2022-08-26 (×2): qty 5

## 2022-08-26 MED ORDER — FENTANYL CITRATE (PF) 250 MCG/5ML IJ SOLN
INTRAMUSCULAR | Status: DC | PRN
Start: 2022-08-26 — End: 2022-08-26
  Administered 2022-08-26: 100 ug via INTRAVENOUS
  Administered 2022-08-26 (×2): 50 ug via INTRAVENOUS
  Administered 2022-08-26: 150 ug via INTRAVENOUS
  Administered 2022-08-26: 200 ug via INTRAVENOUS
  Administered 2022-08-26: 100 ug via INTRAVENOUS
  Administered 2022-08-26: 50 ug via INTRAVENOUS
  Administered 2022-08-26 (×3): 100 ug via INTRAVENOUS

## 2022-08-26 MED ORDER — SODIUM CHLORIDE 0.45 % IV SOLN: INTRAVENOUS | Status: DC | PRN

## 2022-08-26 MED ORDER — SODIUM CHLORIDE 0.9% IV SOLUTION: Freq: Once | INTRAVENOUS | Status: DC

## 2022-08-26 MED ORDER — FENTANYL CITRATE PF 50 MCG/ML IJ SOSY
50.0000 ug | PREFILLED_SYRINGE | INTRAMUSCULAR | Status: DC | PRN
  Administered 2022-08-26 – 2022-08-27 (×5): 50 ug via INTRAVENOUS
  Filled 2022-08-26 (×5): qty 1

## 2022-08-26 MED ORDER — HEMOSTATIC AGENTS (NO CHARGE) OPTIME
TOPICAL | Status: DC | PRN
  Administered 2022-08-26 (×3): 1 via TOPICAL

## 2022-08-26 MED ORDER — HEPARIN SODIUM (PORCINE) 1000 UNIT/ML IJ SOLN
INTRAMUSCULAR | Status: DC | PRN
  Administered 2022-08-26: 28000 [IU] via INTRAVENOUS
  Administered 2022-08-26: 2000 [IU] via INTRAVENOUS

## 2022-08-26 MED ORDER — DOCUSATE SODIUM 100 MG PO CAPS
200.0000 mg | ORAL_CAPSULE | Freq: Every day | ORAL | Status: DC
  Administered 2022-08-27 – 2022-09-01 (×6): 200 mg via ORAL
  Filled 2022-08-26 (×6): qty 2

## 2022-08-26 MED ORDER — MIDAZOLAM HCL 2 MG/2ML IJ SOLN: 2.0000 mg | INTRAMUSCULAR | Status: DC | PRN

## 2022-08-26 MED ORDER — FENTANYL CITRATE PF 50 MCG/ML IJ SOSY: 50.0000 ug | PREFILLED_SYRINGE | INTRAMUSCULAR | Status: DC | PRN

## 2022-08-26 MED ORDER — FAMOTIDINE IN NACL 20-0.9 MG/50ML-% IV SOLN
20.0000 mg | Freq: Two times a day (BID) | INTRAVENOUS | Status: AC
  Administered 2022-08-26 (×2): 20 mg via INTRAVENOUS
  Filled 2022-08-26 (×2): qty 50

## 2022-08-26 MED ORDER — PROTAMINE SULFATE 10 MG/ML IV SOLN
INTRAVENOUS | Status: AC
  Filled 2022-08-26: qty 25

## 2022-08-26 MED ORDER — BISACODYL 5 MG PO TBEC
10.0000 mg | DELAYED_RELEASE_TABLET | Freq: Every day | ORAL | Status: DC
  Administered 2022-08-27 – 2022-09-01 (×6): 10 mg via ORAL
  Filled 2022-08-26 (×6): qty 2

## 2022-08-26 MED ORDER — ARTIFICIAL TEARS OPHTHALMIC OINT
TOPICAL_OINTMENT | OPHTHALMIC | Status: AC
  Filled 2022-08-26: qty 3.5

## 2022-08-26 MED ORDER — NITROGLYCERIN IN D5W 200-5 MCG/ML-% IV SOLN: 0.0000 ug/min | INTRAVENOUS | Status: DC

## 2022-08-26 MED ORDER — 0.9 % SODIUM CHLORIDE (POUR BTL) OPTIME
TOPICAL | Status: DC | PRN
  Administered 2022-08-26: 6000 mL

## 2022-08-26 MED ORDER — SODIUM CHLORIDE 0.9 % IV SOLN: 250.0000 mL | INTRAVENOUS | Status: DC

## 2022-08-26 MED ORDER — SODIUM CHLORIDE 0.9% FLUSH
10.0000 mL | Freq: Two times a day (BID) | INTRAVENOUS | Status: DC
  Administered 2022-08-26 – 2022-08-28 (×3): 10 mL
  Administered 2022-08-28: 20 mL
  Administered 2022-08-29 – 2022-08-30 (×3): 10 mL
  Administered 2022-08-30: 20 mL
  Administered 2022-08-31: 10 mL
  Administered 2022-08-31: 20 mL
  Administered 2022-09-01: 10 mL

## 2022-08-26 MED ORDER — LACTATED RINGERS IV SOLN: INTRAVENOUS | Status: DC | PRN

## 2022-08-26 MED ORDER — LACTATED RINGERS IV SOLN: INTRAVENOUS | Status: DC

## 2022-08-26 MED ORDER — PROPOFOL 10 MG/ML IV BOLUS
INTRAVENOUS | Status: AC
  Filled 2022-08-26: qty 20

## 2022-08-26 MED ORDER — ACETAMINOPHEN 160 MG/5ML PO SOLN: 650.0000 mg | Freq: Once | ORAL | Status: AC

## 2022-08-26 MED ORDER — VANCOMYCIN HCL IN DEXTROSE 1-5 GM/200ML-% IV SOLN
1000.0000 mg | Freq: Once | INTRAVENOUS | Status: AC
  Administered 2022-08-26: 1000 mg via INTRAVENOUS
  Filled 2022-08-26: qty 200

## 2022-08-26 MED ORDER — ACETAMINOPHEN 160 MG/5ML PO SOLN: 1000.0000 mg | Freq: Four times a day (QID) | ORAL | Status: AC

## 2022-08-26 MED ORDER — LIDOCAINE 2% (20 MG/ML) 5 ML SYRINGE
INTRAMUSCULAR | Status: DC | PRN
  Administered 2022-08-26: 80 mg via INTRAVENOUS

## 2022-08-26 MED ORDER — ROCURONIUM BROMIDE 10 MG/ML (PF) SYRINGE
PREFILLED_SYRINGE | INTRAVENOUS | Status: AC
  Filled 2022-08-26: qty 10

## 2022-08-26 MED ORDER — METOCLOPRAMIDE HCL 5 MG/ML IJ SOLN
5.0000 mg | Freq: Four times a day (QID) | INTRAMUSCULAR | Status: DC
  Administered 2022-08-26 – 2022-09-02 (×18): 5 mg via INTRAVENOUS
  Filled 2022-08-26 (×18): qty 2

## 2022-08-26 MED ORDER — ASPIRIN 325 MG PO TBEC
325.0000 mg | DELAYED_RELEASE_TABLET | Freq: Every day | ORAL | Status: DC
  Administered 2022-08-27 – 2022-09-02 (×7): 325 mg via ORAL
  Filled 2022-08-26 (×7): qty 1

## 2022-08-26 MED ORDER — ROCURONIUM BROMIDE 10 MG/ML (PF) SYRINGE
PREFILLED_SYRINGE | INTRAVENOUS | Status: DC | PRN
  Administered 2022-08-26: 100 mg via INTRAVENOUS
  Administered 2022-08-26: 40 mg via INTRAVENOUS
  Administered 2022-08-26 (×2): 50 mg via INTRAVENOUS

## 2022-08-26 MED ORDER — SODIUM CHLORIDE (PF) 0.9 % IJ SOLN
OROMUCOSAL | Status: DC | PRN
  Administered 2022-08-26 (×3): 4 mL via TOPICAL

## 2022-08-26 MED ORDER — ASPIRIN 81 MG PO CHEW
324.0000 mg | CHEWABLE_TABLET | Freq: Every day | ORAL | Status: DC
  Filled 2022-08-26 (×2): qty 4

## 2022-08-26 MED ORDER — MIDAZOLAM HCL (PF) 10 MG/2ML IJ SOLN
INTRAMUSCULAR | Status: AC
  Filled 2022-08-26: qty 2

## 2022-08-26 MED ORDER — PROTAMINE SULFATE 10 MG/ML IV SOLN
INTRAVENOUS | Status: AC
Start: 2022-08-26 — End: ?
  Filled 2022-08-26: qty 5

## 2022-08-26 MED ORDER — SODIUM CHLORIDE 0.9 % IV SOLN: INTRAVENOUS | Status: DC | PRN

## 2022-08-26 MED ORDER — CEFAZOLIN SODIUM-DEXTROSE 2-4 GM/100ML-% IV SOLN
2.0000 g | Freq: Three times a day (TID) | INTRAVENOUS | Status: AC
  Administered 2022-08-26 – 2022-08-28 (×6): 2 g via INTRAVENOUS
  Filled 2022-08-26 (×6): qty 100

## 2022-08-26 MED ORDER — SODIUM CHLORIDE 0.9 % IV SOLN: INTRAVENOUS | Status: DC

## 2022-08-26 MED ORDER — METOPROLOL TARTRATE 25 MG/10 ML ORAL SUSPENSION
12.5000 mg | Freq: Two times a day (BID) | ORAL | Status: DC
  Filled 2022-08-26 (×7): qty 5
  Filled 2022-08-26: qty 10

## 2022-08-26 MED ORDER — LIDOCAINE 2% (20 MG/ML) 5 ML SYRINGE
INTRAMUSCULAR | Status: AC
  Filled 2022-08-26: qty 5

## 2022-08-26 MED ORDER — MIDAZOLAM HCL 5 MG/5ML IJ SOLN
INTRAMUSCULAR | Status: DC | PRN
  Administered 2022-08-26: 1 mg via INTRAVENOUS
  Administered 2022-08-26 (×3): 2 mg via INTRAVENOUS
  Administered 2022-08-26: 1 mg via INTRAVENOUS

## 2022-08-26 MED ORDER — SUCCINYLCHOLINE CHLORIDE 200 MG/10ML IV SOSY
PREFILLED_SYRINGE | INTRAVENOUS | Status: AC
  Filled 2022-08-26: qty 10

## 2022-08-26 MED ORDER — DEXMEDETOMIDINE HCL IN NACL 400 MCG/100ML IV SOLN
0.0000 ug/kg/h | INTRAVENOUS | Status: DC
  Administered 2022-08-26: 0.7 ug/kg/h via INTRAVENOUS
  Filled 2022-08-26: qty 100

## 2022-08-26 MED ORDER — DEXAMETHASONE SODIUM PHOSPHATE 10 MG/ML IJ SOLN
INTRAMUSCULAR | Status: DC | PRN
  Administered 2022-08-26: 10 mg via INTRAVENOUS

## 2022-08-26 MED ORDER — ACETAMINOPHEN 650 MG RE SUPP
650.0000 mg | Freq: Once | RECTAL | Status: AC
  Administered 2022-08-26: 650 mg via RECTAL

## 2022-08-26 MED ORDER — ALBUMIN HUMAN 5 % IV SOLN
250.0000 mL | INTRAVENOUS | Status: AC | PRN
  Administered 2022-08-26 (×2): 12.5 g via INTRAVENOUS

## 2022-08-26 MED ORDER — DEXTROSE 50 % IV SOLN: 0.0000 mL | INTRAVENOUS | Status: DC | PRN

## 2022-08-26 MED ORDER — ONDANSETRON HCL 4 MG/2ML IJ SOLN
4.0000 mg | Freq: Four times a day (QID) | INTRAMUSCULAR | Status: DC
  Administered 2022-08-26 – 2022-08-28 (×7): 4 mg via INTRAVENOUS
  Filled 2022-08-26 (×7): qty 2

## 2022-08-26 MED ORDER — METOPROLOL TARTRATE 12.5 MG HALF TABLET
12.5000 mg | ORAL_TABLET | Freq: Two times a day (BID) | ORAL | Status: DC
  Administered 2022-08-26 – 2022-09-02 (×14): 12.5 mg via ORAL
  Filled 2022-08-26 (×15): qty 1

## 2022-08-26 MED ORDER — SODIUM CHLORIDE 0.9% FLUSH
3.0000 mL | Freq: Two times a day (BID) | INTRAVENOUS | Status: DC
  Administered 2022-08-27 – 2022-08-31 (×4): 3 mL via INTRAVENOUS

## 2022-08-26 MED ORDER — MAGNESIUM SULFATE 4 GM/100ML IV SOLN
4.0000 g | Freq: Once | INTRAVENOUS | Status: AC
  Administered 2022-08-26: 4 g via INTRAVENOUS
  Filled 2022-08-26: qty 100

## 2022-08-26 MED ORDER — ONDANSETRON HCL 4 MG/2ML IJ SOLN
INTRAMUSCULAR | Status: DC | PRN
  Administered 2022-08-26: 4 mg via INTRAVENOUS

## 2022-08-26 MED ORDER — SODIUM CHLORIDE 0.9% FLUSH
10.0000 mL | INTRAVENOUS | Status: DC | PRN
  Administered 2022-09-01 (×2): 10 mL

## 2022-08-26 MED ORDER — POTASSIUM CHLORIDE 10 MEQ/50ML IV SOLN: 10.0000 meq | INTRAVENOUS | Status: AC

## 2022-08-26 MED ORDER — PROPOFOL 10 MG/ML IV BOLUS
INTRAVENOUS | Status: DC | PRN
  Administered 2022-08-26: 50 mg via INTRAVENOUS
  Administered 2022-08-26: 20 mg via INTRAVENOUS

## 2022-08-26 MED ORDER — PHENYLEPHRINE 80 MCG/ML (10ML) SYRINGE FOR IV PUSH (FOR BLOOD PRESSURE SUPPORT)
PREFILLED_SYRINGE | INTRAVENOUS | Status: DC | PRN
  Administered 2022-08-26 (×2): 80 ug via INTRAVENOUS

## 2022-08-26 MED ORDER — ONDANSETRON HCL 4 MG/2ML IJ SOLN: 4.0000 mg | Freq: Four times a day (QID) | INTRAMUSCULAR | Status: DC | PRN

## 2022-08-26 MED ORDER — PANTOPRAZOLE SODIUM 40 MG PO TBEC
40.0000 mg | DELAYED_RELEASE_TABLET | Freq: Every day | ORAL | Status: DC
  Administered 2022-08-28 – 2022-09-02 (×6): 40 mg via ORAL
  Filled 2022-08-26 (×6): qty 1

## 2022-08-26 MED ORDER — MILRINONE LACTATE IN DEXTROSE 20-5 MG/100ML-% IV SOLN: 0.1250 ug/kg/min | INTRAVENOUS | Status: DC

## 2022-08-26 MED ORDER — CHLORHEXIDINE GLUCONATE CLOTH 2 % EX PADS
6.0000 | MEDICATED_PAD | Freq: Every day | CUTANEOUS | Status: DC
  Administered 2022-08-26 – 2022-08-31 (×6): 6 via TOPICAL

## 2022-08-26 MED ORDER — PHENYLEPHRINE HCL-NACL 20-0.9 MG/250ML-% IV SOLN: 0.0000 ug/min | INTRAVENOUS | Status: DC

## 2022-08-26 MED ORDER — LACTATED RINGERS IV SOLN: 500.0000 mL | Freq: Once | INTRAVENOUS | Status: DC | PRN

## 2022-08-26 MED ORDER — PROTAMINE SULFATE 10 MG/ML IV SOLN
INTRAVENOUS | Status: DC | PRN
  Administered 2022-08-26: 20 mg via INTRAVENOUS
  Administered 2022-08-26: 280 mg via INTRAVENOUS

## 2022-08-26 MED ORDER — ACETAMINOPHEN 500 MG PO TABS
1000.0000 mg | ORAL_TABLET | Freq: Four times a day (QID) | ORAL | Status: AC
  Administered 2022-08-26 – 2022-08-31 (×20): 1000 mg via ORAL
  Filled 2022-08-26 (×20): qty 2

## 2022-08-26 SURGICAL SUPPLY — 104 items
ADAPTER CARDIO PERF ANTE/RETRO (ADAPTER) ×3 IMPLANT
ADPR PRFSN 84XANTGRD RTRGD (ADAPTER) ×2
AGENT HMST KT MTR STRL THRMB (HEMOSTASIS) ×2
BAG DECANTER FOR FLEXI CONT (MISCELLANEOUS) ×3 IMPLANT
BLADE NDL 3 SS STRL (BLADE) IMPLANT
BLADE NEEDLE 3 SS STRL (BLADE) ×2 IMPLANT
BLADE STERNUM SYSTEM 6 (BLADE) ×3 IMPLANT
BLADE SURG 12 STRL SS (BLADE) ×3 IMPLANT
BNDG CMPR MED 10X6 ELC LF (GAUZE/BANDAGES/DRESSINGS) ×2
BNDG ELASTIC 4X5.8 VLCR STR LF (GAUZE/BANDAGES/DRESSINGS) ×3 IMPLANT
BNDG ELASTIC 6X10 VLCR STRL LF (GAUZE/BANDAGES/DRESSINGS) IMPLANT
BNDG ELASTIC 6X5.8 VLCR STR LF (GAUZE/BANDAGES/DRESSINGS) ×3 IMPLANT
BNDG GAUZE DERMACEA FLUFF 4 (GAUZE/BANDAGES/DRESSINGS) ×3 IMPLANT
BNDG GZE DERMACEA 4 6PLY (GAUZE/BANDAGES/DRESSINGS) ×2
CANISTER SUCT 3000ML PPV (MISCELLANEOUS) ×3 IMPLANT
CANNULA GUNDRY RCSP 15FR (MISCELLANEOUS) ×3 IMPLANT
CATH CPB KIT VANTRIGT (MISCELLANEOUS) ×3 IMPLANT
CATH ROBINSON RED A/P 18FR (CATHETERS) ×9 IMPLANT
CATH THORACIC 28FR RT ANG (CATHETERS) ×3 IMPLANT
CLIP TI WIDE RED SMALL 24 (CLIP) IMPLANT
CNTNR URN SCR LID CUP LEK RST (MISCELLANEOUS) IMPLANT
CONT SPEC 4OZ STRL OR WHT (MISCELLANEOUS) ×4
CONTAINER PROTECT SURGISLUSH (MISCELLANEOUS) ×6 IMPLANT
DRAIN CHANNEL 32F RND 10.7 FF (WOUND CARE) ×3 IMPLANT
DRAPE CARDIOVASCULAR INCISE (DRAPES) ×2
DRAPE SLUSH/WARMER DISC (DRAPES) ×3 IMPLANT
DRAPE SRG 135X102X78XABS (DRAPES) ×3 IMPLANT
DRSG AQUACEL AG ADV 3.5X14 (GAUZE/BANDAGES/DRESSINGS) ×3 IMPLANT
ELECT BLADE 4.0 EZ CLEAN MEGAD (MISCELLANEOUS) ×2
ELECT BLADE 6.5 EXT (BLADE) ×3 IMPLANT
ELECT CAUTERY BLADE 6.4 (BLADE) ×3 IMPLANT
ELECT REM PT RETURN 9FT ADLT (ELECTROSURGICAL) ×4
ELECTRODE BLDE 4.0 EZ CLN MEGD (MISCELLANEOUS) ×3 IMPLANT
ELECTRODE REM PT RTRN 9FT ADLT (ELECTROSURGICAL) ×6 IMPLANT
FELT TEFLON 1X6 (MISCELLANEOUS) ×6 IMPLANT
GAUZE 4X4 16PLY ~~LOC~~+RFID DBL (SPONGE) ×3 IMPLANT
GAUZE SPONGE 4X4 12PLY STRL (GAUZE/BANDAGES/DRESSINGS) ×6 IMPLANT
GLOVE BIO SURGEON STRL SZ7.5 (GLOVE) ×9 IMPLANT
GLOVE BIOGEL PI IND STRL 6 (GLOVE) IMPLANT
GLOVE SS BIOGEL STRL SZ 6 (GLOVE) IMPLANT
GLOVE SS BIOGEL STRL SZ 6.5 (GLOVE) IMPLANT
GLOVE SURG SS PI 7.5 STRL IVOR (GLOVE) IMPLANT
GOWN STRL REIN XL XLG (GOWN DISPOSABLE) IMPLANT
GOWN STRL REUS W/ TWL LRG LVL3 (GOWN DISPOSABLE) ×12 IMPLANT
GOWN STRL REUS W/TWL LRG LVL3 (GOWN DISPOSABLE) ×16
HEMOSTAT POWDER SURGIFOAM 1G (HEMOSTASIS) ×9 IMPLANT
HEMOSTAT SURGICEL 2X14 (HEMOSTASIS) ×3 IMPLANT
INSERT FOGARTY 61MM (MISCELLANEOUS) IMPLANT
KIT BASIN OR (CUSTOM PROCEDURE TRAY) ×3 IMPLANT
KIT SUCTION CATH 14FR (SUCTIONS) ×3 IMPLANT
KIT TURNOVER KIT B (KITS) ×3 IMPLANT
KIT VASOVIEW HEMOPRO 2 VH 4000 (KITS) ×3 IMPLANT
LEAD PACING MYOCARDI (MISCELLANEOUS) ×3 IMPLANT
MARKER GRAFT CORONARY BYPASS (MISCELLANEOUS) ×9 IMPLANT
NS IRRIG 1000ML POUR BTL (IV SOLUTION) ×15 IMPLANT
PACK E OPEN HEART (SUTURE) ×3 IMPLANT
PACK OPEN HEART (CUSTOM PROCEDURE TRAY) ×3 IMPLANT
PAD ARMBOARD 7.5X6 YLW CONV (MISCELLANEOUS) ×6 IMPLANT
PAD ELECT DEFIB RADIOL ZOLL (MISCELLANEOUS) ×3 IMPLANT
PENCIL BUTTON HOLSTER BLD 10FT (ELECTRODE) ×3 IMPLANT
POSITIONER HEAD DONUT 9IN (MISCELLANEOUS) ×3 IMPLANT
POWDER SURGICEL 3.0 GRAM (HEMOSTASIS) ×3 IMPLANT
PUNCH AORTIC ROTATE 4.0MM (MISCELLANEOUS) IMPLANT
PUNCH AORTIC ROTATE 4.5MM 8IN (MISCELLANEOUS) IMPLANT
PUNCH AORTIC ROTATE 5MM 8IN (MISCELLANEOUS) IMPLANT
SET MPS 3-ND DEL (MISCELLANEOUS) IMPLANT
SPONGE T-LAP 18X18 ~~LOC~~+RFID (SPONGE) ×12 IMPLANT
SPONGE T-LAP 4X18 ~~LOC~~+RFID (SPONGE) ×3 IMPLANT
SUPPORT HEART JANKE-BARRON (MISCELLANEOUS) ×3 IMPLANT
SURGIFLO W/THROMBIN 8M KIT (HEMOSTASIS) ×3 IMPLANT
SUT BONE WAX W31G (SUTURE) ×3 IMPLANT
SUT MNCRL AB 4-0 PS2 18 (SUTURE) IMPLANT
SUT PROLENE 3 0 SH DA (SUTURE) IMPLANT
SUT PROLENE 3 0 SH1 36 (SUTURE) IMPLANT
SUT PROLENE 4 0 RB 1 (SUTURE) ×2
SUT PROLENE 4 0 SH DA (SUTURE) ×3 IMPLANT
SUT PROLENE 4-0 RB1 .5 CRCL 36 (SUTURE) ×3 IMPLANT
SUT PROLENE 5 0 C 1 36 (SUTURE) IMPLANT
SUT PROLENE 6 0 C 1 24 (SUTURE) IMPLANT
SUT PROLENE 6 0 C 1 30 (SUTURE) IMPLANT
SUT PROLENE 6 0 CC (SUTURE) ×9 IMPLANT
SUT PROLENE 8 0 BV175 6 (SUTURE) IMPLANT
SUT PROLENE BLUE 7 0 (SUTURE) ×3 IMPLANT
SUT SILK  1 MH (SUTURE) ×4
SUT SILK 1 MH (SUTURE) IMPLANT
SUT SILK 2 0 SH CR/8 (SUTURE) IMPLANT
SUT SILK 3 0 SH CR/8 (SUTURE) IMPLANT
SUT STEEL 6MS V (SUTURE) ×6 IMPLANT
SUT STEEL SZ 6 DBL 3X14 BALL (SUTURE) ×3 IMPLANT
SUT VIC AB 1 CTX 36 (SUTURE) ×6
SUT VIC AB 1 CTX36XBRD ANBCTR (SUTURE) ×6 IMPLANT
SUT VIC AB 2-0 CT1 27 (SUTURE) ×2
SUT VIC AB 2-0 CT1 TAPERPNT 27 (SUTURE) IMPLANT
SUT VIC AB 2-0 CTX 27 (SUTURE) IMPLANT
SUT VIC AB 3-0 X1 27 (SUTURE) IMPLANT
SYSTEM SAHARA CHEST DRAIN ATS (WOUND CARE) ×3 IMPLANT
TAPE CLOTH SURG 4X10 WHT LF (GAUZE/BANDAGES/DRESSINGS) IMPLANT
TAPE PAPER 2X10 WHT MICROPORE (GAUZE/BANDAGES/DRESSINGS) IMPLANT
TOWEL GREEN STERILE (TOWEL DISPOSABLE) ×3 IMPLANT
TOWEL GREEN STERILE FF (TOWEL DISPOSABLE) ×3 IMPLANT
TRAY FOLEY SLVR 16FR TEMP STAT (SET/KITS/TRAYS/PACK) ×3 IMPLANT
TUBING LAP HI FLOW INSUFFLATIO (TUBING) ×3 IMPLANT
UNDERPAD 30X36 HEAVY ABSORB (UNDERPADS AND DIAPERS) ×3 IMPLANT
WATER STERILE IRR 1000ML POUR (IV SOLUTION) ×6 IMPLANT

## 2022-08-26 NOTE — Op Note (Unsigned)
NAME: Lisa [name], Lisa [name] MEDICAL RECORD NO: [id number] ACCOUNT NO: [id number] DATE OF BIRTH: [birth year] FACILITY: [facility] LOCATION: [facility] PHYSICIAN: Kerin Perna III, MD  Operative Report   DATE OF PROCEDURE: 08/26/2022  OPERATION: 1.  Coronary artery bypass grafting x3 (left internal mammary artery to LAD, saphenous vein graft to OM2, saphenous vein graft to RCA). 2.  Endoscopic harvest of right leg greater saphenous vein.  SURGEON:  Mikey Bussing, MD  ASSISTANT:  Jillyn Hidden, PA-C.  A surgical first assistant was needed for this surgery due to its complexity and to maintain the standard of surgical care for cardiac surgery at this hospital.  The surgical first assistant was needed to endoscopically harvest the saphenous vein  conduit, close the leg incision, assist with the distal anastomoses with suture management, exposure, suctioning, and general assistance.  ANESTHESIA:  General by Dr. Merilynn Finland.  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  DESCRIPTION OF PROCEDURE:  The patient was seen in preoperative holding where informed consent was documented and final issues regarding the procedure were discussed with the patient.  The patient had been previously seen several times in consultation to  discuss the procedure of CABG including the benefits, risks and alternatives.  The patient presented with a severe 90% left main stenosis and a 99% proximal circumflex stenosis with unstable angina and was recommended for multivessel CABG by her  cardiologist.  The patient had been on Plavix for several years following a mini stroke in 2021 and needed  a Plavix washout before surgery during which time she remained stable on IV heparin.  The patient was directly brought to the operating room from preoperative holding where she was placed on the OR table and general anesthesia was induced under invasive hemodynamic monitoring.  The patient remained stable.  A Foley catheter was inserted  and the patient was prepped and draped as a sterile field and a proper timeout was performed.  A transesophageal echo probe had been placed by the anesthesia team.  A sternal incision was made as the saphenous vein was harvested endoscopically from the right leg.  The left internal mammary artery was harvested as a pedicle graft from its origin at the subclavian vessels.  It was 1.4 mm vessel with good flow.  The  sternal retractor was placed and the pericardium was opened and suspended.  Pursestrings were placed in the ascending aorta and right atrium and after the vein had been harvested, the patient was heparinized.  The patient was cannulated and placed on  cardiopulmonary bypass.  The targets for grafting and the LAD distribution, the circumflex and the main RCA were identified and dissected out.  Cardioplegia cannulas were placed both antegrade and retrograde cold blood cardioplegia and the patient was  cooled to 32 degrees.  The aortic crossclamp was applied and 1 liter of cold blood cardioplegia was delivered in split doses between the antegrade aortic and retrograde coronary sinus catheters.  There was good cardioplegic arrest and supple temperature  dropped less than 14 degrees.  Cardioplegia was delivered every 20 minutes while the crossclamp was in place.  The distal coronary anastomoses were performed.  The first distal anastomosis was the RCA before the bifurcation.  There was a proximal  90% stenosis.  A reverse saphenous vein was sewn end-to-side with running 7-0 Prolene.  There was good flow through  the graft.  Cardioplegia was redosed.  The second  distal anastomosis was the OM2 branch of the left circumflex.  This was a proximal 99% left circumflex stenosis.  A reverse saphenous vein was sewn end-to-side with running 7-0 Prolene with good flow through the graft.  Cardioplegia was  redosed.  The third distal anastomosis was the distal third of the LAD.  There was a proximal 90% left main stenosis.  The left IMA pedicle was brought through an opening and the left lateral pericardium was brought onto the LAD and sewn end-to-side with running  8-0 Prolene.  There was good flow through the anastomosis after briefly releasing the bulldog mammary on the mammary pedicle.  The bulldog was reapplied and the pedicle was secured to epicardium with 6-0 Prolenes.  Cardioplegia was redosed.  The crossclamp was still in place, 2 proximal vein anastomoses were performed on the ascending aorta using a 4.5 mm punch and running 6-0 Prolene.  Prior to tying down the final proximal anastomosis, air was vented from the coronaries with a dose of  retrograde warm blood cardioplegia.  The crossclamp was removed.  The heart resumed a spontaneous rhythm.  The vein grafts were de-aired and opened.  Each was examined and had good flow.  Hemostasis was documented at the proximal and distal anastomoses.  The patient was then rewarmed and reperfused.  Temporary pacing  wires were applied.  The lungs were expanded and ventilator was resumed.  The patient was weaned from cardiopulmonary bypass on low dose milrinone without difficulty.  Echo showed normal preservation of LV, normal function.  Hemodynamics were stable.   Protamine was administered without adverse reaction.  There was incomplete hemostasis.  The patient was given 1 unit of FFP and platelets with restoration of good hemostasis following her Plavix previous therapy.  The superior pericardial fat was closed over the aorta and vein grafts.  Anterior mediastinal and left pleural chest tubes were placed and brought out  through separate incisions.  The sternum was closed with wire.  The patient remained stable.  The  pectoralis fascia was closed with a running #1 Vicryl.  Subcutaneous and skin layers were closed with running Vicryl and sterile dressings were applied.  Total cardiopulmonary bypass time was 110 minutes.   PUS D: 08/26/2022 4:02:46 pm T: 08/26/2022 4:29:00 pm  JOB: [id number]/ [id number]

## 2022-08-26 NOTE — Hospital Course (Addendum)
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  She has been having exertional symptoms of extreme shortness of breath and neck discomfort.  Cardiac CT was followed by cardiac catheterization which confirmed significant left main stenosis, proximal ostial 90% circumflex stenosis and 85% proximal RCA stenosis.  LV systolic function is well-preserved.  An echocardiogram she has some thickening of the aortic valve/aortic sclerosis but no significant gradient, no aortic stenosis.  Per cardiology team has recommended surgical coronary revascularization.   The patient has been living at a high functional level independently in her villa at a retirement  center in [place].  She uses a cane to walk due to spinal stenosis and low back arthritis.  She has had low back surgery in several low back injections.  She was scheduled to see her spinal surgeon when she started developing her symptoms of angina.   Patient had a mild stroke of the left hemisphere 2 years ago with full recovery.  She has been on Plavix since then.   Her other medical problems include sleep apnea using CPAP, dyslipidemia, GERD and mild chronic kidney disease.   She has no symptoms of other vascular disease and pre-CABG Dopplers show no obstructive carotid or peripheral artery disease.    The patient meets criteria for benefit from multivessel CABG after Plavix washout which will be scheduled for Tuesday, September 5.  Results of her pre-CABG Dopplers and PFTs and orthopantogram were reviewed and were satisfactory.  She understands she will need to remain in the hospital on IV heparin until surgery.  Hospital Course:  Ms. Louks remained stable on a heparin infusion while awaiting Plavix  washout. She was taken to the OR on 08/26/22 where CABG x 3 was carried out without complication.  Following surgery, she was taken to the surgical ICU on milrinone.  Vital signs, hemodynamics, and cardiac rhythm all remained stable.  She was extubated routinely.  She had significant nausea on the first postoperative day.  With limited p.o. intake and started her on scheduled Reglan along with Protonix and Zofran as needed.  She was mobilized.  The milrinone was weaned off without any difficulty.  Monitoring lines and Foley catheter were removed.  By the second postoperative day.  She was ambulating a full lap in the unit.  The nausea had resolved.  Her diet was advanced and well-tolerated.

## 2022-08-26 NOTE — Transfer of Care (Signed)
Immediate Anesthesia Transfer of Care Note  Patient: Lisa [name], Dr.  Nigel Bridgeman) Performed: CORONARY ARTERY BYPASS GRAFTING (CABG) X THREE, USING LEFT INTERNAL MAMMARY ARTERY AND RIGHT LEG GREATER SAPHENOUS VEIN HARVESTED ENDOSCOPICALLY (Chest) TRANSESOPHAGEAL ECHOCARDIOGRAM (TEE)  Patient Location: ICU  Anesthesia Type:General  Level of Consciousness: sedated, unresponsive and Patient remains intubated per anesthesia plan  Airway & Oxygen Therapy: Patient remains intubated per anesthesia plan and Patient placed on Ventilator (see vital sign flow sheet for setting)  Post-op Assessment: Report given to RN and Post -op Vital signs reviewed and stable  Post vital signs: Reviewed and stable  Last Vitals:  Vitals Value Taken Time  BP 120/58 08/26/22 1334  Temp 35.9 C 08/26/22 1341  Pulse 88 08/26/22 1341  Resp 12 08/26/22 1341  SpO2 94 % 08/26/22 1341  Vitals shown include unvalidated device data.  Last Pain:  Vitals:   08/26/22 0550  TempSrc: Oral  PainSc: 0-No pain      Patients Stated Pain Goal: 0 (08/22/22 2233)  Complications: No notable events documented.

## 2022-08-26 NOTE — Progress Notes (Signed)
Echocardiogram Echocardiogram Transesophageal has been performed.  Lisa [name] 08/26/2022, 9:05 AM

## 2022-08-26 NOTE — Anesthesia Procedure Notes (Signed)
Central Venous Catheter Insertion Performed by: Santa Lighter, MD, anesthesiologist Start/End9/04/2022 6:46 AM, 08/26/2022 6:56 AM Patient location: Pre-op. Preanesthetic checklist: patient identified, IV checked, site marked, risks and benefits discussed, surgical consent, monitors and equipment checked, pre-op evaluation, timeout performed and anesthesia consent Position: Trendelenburg Lidocaine 1% used for infiltration and patient sedated Hand hygiene performed , maximum sterile barriers used  and Seldinger technique used Catheter size: 9 Fr Central line was placed.MAC introducer Procedure performed using ultrasound guided technique. Ultrasound Notes:anatomy identified, needle tip was noted to be adjacent to the nerve/plexus identified, no ultrasound evidence of intravascular and/or intraneural injection and image(s) printed for medical record Attempts: 1 Following insertion, line sutured, dressing applied and Biopatch. Post procedure assessment: free fluid flow, blood return through all ports and no air  Patient tolerated the procedure well with no immediate complications.

## 2022-08-26 NOTE — Progress Notes (Signed)
      [address]       [place] [address]             [phone]      S/p CABG x 3  Intubated, awake  BP 100/65   Pulse 88   Temp 98.1 F (36.7 C)   Resp 12   Ht 5\' 3"  (1.6 m)   Wt 80.2 kg   SpO2 97%   BMI 31.32 kg/m  CI= 2.23  Intake/Output Summary (Last 24 hours) at 08/26/2022 1743 Last data filed at 08/26/2022 1700 Gross per 24 hour  Intake 4342.87 ml  Output 2212 ml  Net 2130.87 ml   Minimal CT output CBG well controlled Poor respiratory effort with initial attempt to wean To start wean again at 6 PM  Laquinton Bihm C. 10/26/2022, MD [facility] [phone]

## 2022-08-26 NOTE — Progress Notes (Signed)
Rounding Note    Patient Name: Lisa [name], Dr. Date of Encounter: 08/26/2022  [facility] Cardiologist: Debbe Odea, MD   Subjective   For CABG this am with PVT   Inpatient Medications    Scheduled Meds:  [MAR Hold] amLODipine  5 mg Oral QHS   [MAR Hold] aspirin EC  81 mg Oral Daily   [MAR Hold] atorvastatin  80 mg Oral QHS   [MAR Hold] Chlorhexidine Gluconate Cloth  6 each Topical Daily   [MAR Hold] docusate sodium  100 mg Oral Daily   epinephrine  0-10 mcg/min Intravenous To OR   [MAR Hold] fenofibrate  160 mg Oral Daily   [MAR Hold] fluticasone  2 spray Each Nare Daily   heparin sodium (porcine) 2,500 Units, papaverine 30 mg in electrolyte-A (PLASMALYTE-A PH 7.4) 500 mL irrigation   Irrigation To OR   insulin   Intravenous To OR   [facility] Blood Cardioplegia vial (lidocaine/magnesium/mannitol 0.26g-4g-6.4g)   Intracoronary To OR   [MAR Hold] melatonin  5 mg Oral QHS   [MAR Hold] metoprolol tartrate  25 mg Oral BID   [MAR Hold] mirabegron ER  50 mg Oral QHS   [MAR Hold] mupirocin ointment  1 Application Nasal BID   phenylephrine  30-200 mcg/min Intravenous To OR   [MAR Hold] polyethylene glycol  17 g Oral Daily   potassium chloride  80 mEq Other To OR   [MAR Hold] sodium chloride flush  10-40 mL Intracatheter Q12H   tranexamic acid  15 mg/kg (Adjusted) Intravenous To OR   tranexamic acid  2 mg/kg Intracatheter To OR   Continuous Infusions:  [MAR Hold] sodium chloride      ceFAZolin (ANCEF) IV      ceFAZolin (ANCEF) IV     dexmedetomidine     heparin 30,000 units/NS 1000 mL solution for CELLSAVER     heparin Stopped (08/26/22 0550)   milrinone     nitroGLYCERIN     norepinephrine     tranexamic acid (CYKLOKAPRON) 2,500 mg in sodium chloride 0.9 % 250 mL (10 mg/mL) infusion     vancomycin     PRN Meds: [MAR Hold] sodium chloride, [MAR Hold] acetaminophen, [MAR Hold] ALPRAZolam, [MAR Hold] ondansetron (ZOFRAN) IV, [MAR Hold] sodium chloride  flush, temazepam   Vital Signs    Vitals:   08/25/22 1241 08/25/22 2020 08/25/22 2157 08/26/22 0550  BP: (!) 127/59 (!) 103/90 138/60   Pulse: (!) 58 63 78 (!) 58  Resp: 17 15  18   Temp: 97.8 F (36.6 C) 98.2 F (36.8 C)  98.2 F (36.8 C)  TempSrc: Oral Oral  Oral  SpO2: 99% 98%    Weight:      Height:        Intake/Output Summary (Last 24 hours) at 08/26/2022 0649 Last data filed at 08/25/2022 2251 Gross per 24 hour  Intake 215.32 ml  Output --  Net 215.32 ml       08/20/2022    2:29 PM 08/19/2022    8:24 AM 08/08/2022    1:44 PM  Last 3 Weights  Weight (lbs) 176 lb 12.6 oz 176 lb 12.8 oz 177 lb 2 oz  Weight (kg) 80.19 kg 80.196 kg 80.343 kg      Telemetry    SR to SB - Personally Reviewed  ECG    No new - Personally Reviewed  Physical Exam   GEN: No acute distress.   Neck: No JVD Cardiac: RRR, no murmurs, rubs, or  gallops.  Respiratory: Clear to auscultation bilaterally. GI: Soft, nontender, non-distended  MS: No edema; No deformity. Neuro:  Nonfocal  Psych: Normal affect   Labs    High Sensitivity Troponin:  No results for input(s): "TROPONINIHS" in the last 720 hours.   Chemistry Recent Labs  Lab 08/19/22 0957 08/21/22 0301 08/22/22 0527 08/24/22 0520 08/25/22 0500 08/26/22 0539  NA 141 143   < > 141 141 143  K 3.9 4.3   < > 4.1 3.7 4.2  CL 109 111   < > 111 114* 107  CO2 25 23   < > 24 22 23   GLUCOSE 114* 112*   < > 111* 110* 117*  BUN 27* 24*   < > 28* 27* 28*  CREATININE 1.07* 1.14*   < > 1.16* 0.99 1.16*  CALCIUM 9.9 8.9   < > 9.0 8.7* 9.9  PROT 7.5  --   --   --   --   --   ALBUMIN 4.1 3.4*  --   --   --   --   AST 20  --   --   --   --   --   ALT 19  --   --   --   --   --   ALKPHOS 66  --   --   --   --   --   BILITOT 0.4  --   --   --   --   --   GFRNONAA 53* 49*   < > 48* 58* 48*  ANIONGAP 7 9   < > 6 5 13    < > = values in this interval not displayed.    Lipids  Recent Labs  Lab 08/19/22 0957  CHOL 171  TRIG 160*  HDL  67  LDLCALC 72  CHOLHDL 2.6    Hematology Recent Labs  Lab 08/24/22 0520 08/25/22 0500 08/26/22 0539  WBC 5.3 5.1 6.6  RBC 3.47* 3.55* 3.95  HGB 10.3* 10.8* 11.9*  HCT 31.9* 32.4* 35.9*  MCV 91.9 91.3 90.9  MCH 29.7 30.4 30.1  MCHC 32.3 33.3 33.1  RDW 13.0 12.9 13.0  PLT 221 214 229   Thyroid  Recent Labs  Lab 08/21/22 0301  TSH 4.751*    BNPNo results for input(s): "BNP", "PROBNP" in the last 168 hours.  DDimer No results for input(s): "DDIMER" in the last 168 hours.   Radiology    No results found.  Cardiac Studies   Cath: 08/20/22     Mid LM to Ost LAD lesion is 90% stenosed.   Ost Cx to Prox Cx lesion is 99% stenosed.   Prox LAD to Mid LAD lesion is 30% stenosed.   Prox RCA lesion is 85% stenosed.   The left ventricular systolic function is normal.   LV end diastolic pressure is mildly elevated.   The left ventricular ejection fraction is 55-65% by visual estimate.   1.  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  The coronary arteries are heavily calcified. 2.  Normal LV systolic function mildly elevated left ventricular end-diastolic pressure.  No significant gradient across aortic valve.   Recommendations: Recommend CABG for revascularization.  Given the patient's symptoms at rest, recommend admission to the hospital and initiation of heparin drip.  Unfortunately, the patient is on clopidogrel therapy as an outpatient and this was discontinued  today.  CABG can likely be performed early next week.  The coronary anatomy is not favorable for PCI.   Diagnostic Dominance: Co-dominant  Echo: 08/20/22   IMPRESSIONS     1. Left ventricular ejection fraction, by estimation, is 60 to 65%. The  left ventricle has normal function. The left ventricle has no regional  wall motion abnormalities. Left ventricular diastolic  parameters are  consistent with Grade I diastolic  dysfunction (impaired relaxation).   2. Right ventricular systolic function is normal. The right ventricular  size is normal. There is normal pulmonary artery systolic pressure. The  estimated right ventricular systolic pressure is 27.2 mmHg.   3. The mitral valve is normal in structure. Mild mitral valve  regurgitation. No evidence of mitral stenosis. Moderate mitral annular  calcification.   4. The aortic valve is tricuspid. There is mild calcification of the  aortic valve. There is mild thickening of the aortic valve. Aortic valve  regurgitation is not visualized. Aortic valve sclerosis/calcification is  present, without any evidence of  aortic stenosis.   5. The inferior vena cava is normal in size with greater than 50%  respiratory variability, suggesting right atrial pressure of 3 mmHg.   Comparison(s): No significant change from prior study. Prior images  reviewed side by side.   Patient Profile     81 y.o. female past medical history of CAD, stroke, CKD stage II, hypertension, hyperlipidemia and GERD who was recently seen in the office for complaints of exertional dyspnea and chest discomfort.  Underwent outpatient coronary CTA which was abnormal and presented for cardiac cath and now plans for CABG  Assessment & Plan    CAD Dyspnea on exertion -- Underwent outpatient cardiac catheterization noted above with codominant coronary arteries with severe distal left main bifurcation disease into the ostium of the LAD and left circumflex of 90 and 99%.  Also with 85% proximal RCA lesion.  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

## 2022-08-26 NOTE — Procedures (Signed)
Extubation Procedure Note  Pt extubated following Cardiac wean. NIF -22, VC 0.85 L. Cuff leak +, No stridor. 2L [place] post extubation.   Patient Details:   Name: [name], Dr. Park Meo: 09-08-1941 MRN: [id number]   Airway Documentation:    Vent end date: 08/26/22 Vent end time: 2040   Evaluation  O2 sats: stable throughout Complications: No apparent complications Patient did tolerate procedure well. Bilateral Breath Sounds: Diminished   Yes  Lianne Bushy 08/26/2022, 8:44 PM

## 2022-08-26 NOTE — Progress Notes (Signed)
Pre Procedure note for inpatients:   [name], Dr. has been scheduled for Procedure(s): CORONARY ARTERY BYPASS GRAFTING (CABG) (N/A) TRANSESOPHAGEAL ECHOCARDIOGRAM (TEE) (N/A) today. The various methods of treatment have been discussed with the patient. After consideration of the risks, benefits and treatment options the patient has consented to the planned procedure.   The patient has been seen and labs reviewed. There are no changes in the patient's condition to prevent proceeding with the planned procedure today.  Recent labs:  Lab Results  Component Value Date   WBC 6.6 08/26/2022   HGB 11.9 (L) 08/26/2022   HCT 35.9 (L) 08/26/2022   PLT 229 08/26/2022   GLUCOSE 117 (H) 08/26/2022   CHOL 171 08/19/2022   TRIG 160 (H) 08/19/2022   HDL 67 08/19/2022   LDLDIRECT 92 08/19/2022   LDLCALC 72 08/19/2022   ALT 19 08/19/2022   AST 20 08/19/2022   NA 143 08/26/2022   K 4.2 08/26/2022   CL 107 08/26/2022   CREATININE 1.16 (H) 08/26/2022   BUN 28 (H) 08/26/2022   CO2 23 08/26/2022   TSH 4.751 (H) 08/21/2022   INR 1.1 08/25/2022   HGBA1C 6.3 (H) 08/21/2022    Lovett Sox, MD 08/26/2022 7:45 AM

## 2022-08-26 NOTE — Anesthesia Procedure Notes (Signed)
Arterial Line Insertion Start/End9/04/2022 7:02 AM, 08/26/2022 7:08 AM Performed by: Collene Schlichter, MD, anesthesiologist  Patient location: Pre-op. Preanesthetic checklist: patient identified, IV checked, site marked, risks and benefits discussed, surgical consent, monitors and equipment checked, pre-op evaluation, timeout performed and anesthesia consent Lidocaine 1% used for infiltration Left, radial was placed Catheter size: 20 G Hand hygiene performed  and maximum sterile barriers used   Attempts: 2 Procedure performed using ultrasound guided technique. Ultrasound Notes:anatomy identified, needle tip was noted to be adjacent to the nerve/plexus identified, no ultrasound evidence of intravascular and/or intraneural injection and image(s) printed for medical record Following insertion, dressing applied and Biopatch. Post procedure assessment: normal and unchanged  Post procedure complications: second provider assisted. Patient tolerated the procedure well with no immediate complications.

## 2022-08-26 NOTE — Anesthesia Procedure Notes (Signed)
Central Venous Catheter Insertion Performed by: Collene Schlichter, MD, anesthesiologist Start/End9/04/2022 6:56 AM, 08/26/2022 7:01 AM Patient location: Pre-op. Preanesthetic checklist: patient identified, IV checked, site marked, risks and benefits discussed, surgical consent, monitors and equipment checked, pre-op evaluation and timeout performed Position: Trendelenburg Hand hygiene performed  and maximum sterile barriers used  Total catheter length 100. PA cath was placed.Swan type:thermodilution PA Cath depth:45 Procedure performed without using ultrasound guided technique. Attempts: 1 Patient tolerated the procedure well with no immediate complications.

## 2022-08-26 NOTE — Anesthesia Procedure Notes (Signed)
Procedure Name: Intubation Date/Time: 08/26/2022 8:05 AM  Performed by: Nils Pyle, CRNAPre-anesthesia Checklist: Patient identified, Emergency Drugs available, Suction available and Patient being monitored Patient Re-evaluated:Patient Re-evaluated prior to induction Oxygen Delivery Method: Circle System Utilized Preoxygenation: Pre-oxygenation with 100% oxygen Induction Type: IV induction Ventilation: Mask ventilation without difficulty and Oral airway inserted - appropriate to patient size Laryngoscope Size: Hyacinth Meeker and 2 Grade View: Grade I Tube type: Oral Tube size: 8.0 mm Number of attempts: 1 Airway Equipment and Method: Stylet and Oral airway Placement Confirmation: ETT inserted through vocal cords under direct vision, positive ETCO2 and breath sounds checked- equal and bilateral Secured at: 22 cm Tube secured with: Tape Dental Injury: Teeth and Oropharynx as per pre-operative assessment

## 2022-08-26 NOTE — Brief Op Note (Addendum)
08/20/2022 - 08/26/2022  11:34 AM  PATIENT:  [name], Dr.  81 y.o. female  PRE-OPERATIVE DIAGNOSIS: Coronary Artery Disease, Severe Left Main Coronary Artery Stenosis, Unstable Angina Pectoris, preop plavix  POST-OPERATIVE DIAGNOSIS:  Coronary Artery Disease, Severe Left Main Coronary Artery Stenosis, Unstable Angina Pectoris, preop plavix  PROCEDURES:   CORONARY ARTERY BYPASS GRAFTING (CABG) X THREE, USING LEFT INTERNAL MAMMARY ARTERY AND RIGHT LEG  LIMA-LAD SVG-OM2 SVG-RCA  TRANSESOPHAGEAL ECHOCARDIOGRAM (TEE) (N/A)  GREATER SAPHENOUS VEIN HARVESTED ENDOSCOPICALLY Vein harvest time: Vein prep time:  SURGEON: Lovett Sox, MD - Primary  PHYSICIAN ASSISTANT: Roddenberry  ASSISTANTS: Tanda Rockers, RN, RN First Assistant   ANESTHESIA:   general  EBL:  BLOOD ADMINISTERED:  Plts x 1 pheresis, FFP x 1 unit  DRAINS:  Left pleural and mediastinal drains    LOCAL MEDICATIONS USED:  NONE  SPECIMEN:  No Specimen  DISPOSITION OF SPECIMEN:  N/A  COUNTS:  YES  DICTATION: .Dragon Dictation  PLAN OF CARE: Admit to inpatient   PATIENT DISPOSITION:  ICU - intubated and hemodynamically stable.   Delay start of Pharmacological VTE agent (>24hrs) due to surgical blood loss or risk of bleeding: yes

## 2022-08-27 ENCOUNTER — Encounter (HOSPITAL_COMMUNITY): Payer: Self-pay | Admitting: Cardiothoracic Surgery

## 2022-08-27 ENCOUNTER — Inpatient Hospital Stay (HOSPITAL_COMMUNITY): Payer: Medicare Other

## 2022-08-27 DIAGNOSIS — Z951 Presence of aortocoronary bypass graft: Secondary | ICD-10-CM

## 2022-08-27 LAB — MAGNESIUM
Magnesium: 2.7 mg/dL — ABNORMAL HIGH (ref 1.7–2.4)
Magnesium: 2.7 mg/dL — ABNORMAL HIGH (ref 1.7–2.4)

## 2022-08-27 LAB — POCT I-STAT 7, (LYTES, BLD GAS, ICA,H+H)
Acid-base deficit: 1 mmol/L (ref 0.0–2.0)
Acid-base deficit: 1 mmol/L (ref 0.0–2.0)
Acid-base deficit: 1 mmol/L (ref 0.0–2.0)
Acid-base deficit: 2 mmol/L (ref 0.0–2.0)
Bicarbonate: 23.6 mmol/L (ref 20.0–28.0)
Bicarbonate: 23.9 mmol/L (ref 20.0–28.0)
Bicarbonate: 23.5 mmol/L (ref 20.0–28.0)
Bicarbonate: 22.7 mmol/L (ref 20.0–28.0)
Calcium, Ion: 1.09 mmol/L — ABNORMAL LOW (ref 1.15–1.40)
Calcium, Ion: 1.13 mmol/L — ABNORMAL LOW (ref 1.15–1.40)
Calcium, Ion: 1.08 mmol/L — ABNORMAL LOW (ref 1.15–1.40)
Calcium, Ion: 1.11 mmol/L — ABNORMAL LOW (ref 1.15–1.40)
HCT: 26 % — ABNORMAL LOW (ref 36.0–46.0)
HCT: 27 % — ABNORMAL LOW (ref 36.0–46.0)
HCT: 29 % — ABNORMAL LOW (ref 36.0–46.0)
HCT: 28 % — ABNORMAL LOW (ref 36.0–46.0)
Hemoglobin: 8.8 g/dL — ABNORMAL LOW (ref 12.0–15.0)
Hemoglobin: 9.2 g/dL — ABNORMAL LOW (ref 12.0–15.0)
Hemoglobin: 9.9 g/dL — ABNORMAL LOW (ref 12.0–15.0)
Hemoglobin: 9.5 g/dL — ABNORMAL LOW (ref 12.0–15.0)
O2 Saturation: 99 %
O2 Saturation: 99 %
O2 Saturation: 99 %
O2 Saturation: 96 %
Patient temperature: 35.7
Patient temperature: 35.7
Patient temperature: 37.5
Patient temperature: 37.7
Potassium: 4 mmol/L (ref 3.5–5.1)
Potassium: 4.1 mmol/L (ref 3.5–5.1)
Potassium: 5 mmol/L (ref 3.5–5.1)
Potassium: 4.1 mmol/L (ref 3.5–5.1)
Sodium: 139 mmol/L (ref 135–145)
Sodium: 140 mmol/L (ref 135–145)
Sodium: 138 mmol/L (ref 135–145)
Sodium: 140 mmol/L (ref 135–145)
TCO2: 25 mmol/L (ref 22–32)
TCO2: 25 mmol/L (ref 22–32)
TCO2: 25 mmol/L (ref 22–32)
TCO2: 24 mmol/L (ref 22–32)
pCO2 arterial: 35.2 mmHg (ref 32–48)
pCO2 arterial: 36.6 mmHg (ref 32–48)
pCO2 arterial: 38.5 mmHg (ref 32–48)
pCO2 arterial: 38 mmHg (ref 32–48)
pH, Arterial: 7.417 (ref 7.35–7.45)
pH, Arterial: 7.429 (ref 7.35–7.45)
pH, Arterial: 7.395 (ref 7.35–7.45)
pH, Arterial: 7.386 (ref 7.35–7.45)
pO2, Arterial: 106 mmHg (ref 83–108)
pO2, Arterial: 112 mmHg — ABNORMAL HIGH (ref 83–108)
pO2, Arterial: 121 mmHg — ABNORMAL HIGH (ref 83–108)
pO2, Arterial: 89 mmHg (ref 83–108)

## 2022-08-27 LAB — CBC
HCT: 29.2 % — ABNORMAL LOW (ref 36.0–46.0)
HCT: 30.1 % — ABNORMAL LOW (ref 36.0–46.0)
Hemoglobin: 9.8 g/dL — ABNORMAL LOW (ref 12.0–15.0)
Hemoglobin: 10 g/dL — ABNORMAL LOW (ref 12.0–15.0)
MCH: 30.4 pg (ref 26.0–34.0)
MCH: 30.4 pg (ref 26.0–34.0)
MCHC: 33.6 g/dL (ref 30.0–36.0)
MCHC: 33.2 g/dL (ref 30.0–36.0)
MCV: 90.7 fL (ref 80.0–100.0)
MCV: 91.5 fL (ref 80.0–100.0)
Platelets: 170 10*3/uL (ref 150–400)
Platelets: 184 10*3/uL (ref 150–400)
RBC: 3.22 MIL/uL — ABNORMAL LOW (ref 3.87–5.11)
RBC: 3.29 MIL/uL — ABNORMAL LOW (ref 3.87–5.11)
RDW: 13.7 % (ref 11.5–15.5)
RDW: 13.9 % (ref 11.5–15.5)
WBC: 12.1 10*3/uL — ABNORMAL HIGH (ref 4.0–10.5)
WBC: 13.7 10*3/uL — ABNORMAL HIGH (ref 4.0–10.5)
nRBC: 0 % (ref 0.0–0.2)
nRBC: 0 % (ref 0.0–0.2)

## 2022-08-27 LAB — PREPARE PLATELET PHERESIS: Unit division: 0

## 2022-08-27 LAB — BASIC METABOLIC PANEL
Anion gap: 10 (ref 5–15)
Anion gap: 10 (ref 5–15)
BUN: 21 mg/dL (ref 8–23)
BUN: 27 mg/dL — ABNORMAL HIGH (ref 8–23)
CO2: 20 mmol/L — ABNORMAL LOW (ref 22–32)
CO2: 21 mmol/L — ABNORMAL LOW (ref 22–32)
Calcium: 7.9 mg/dL — ABNORMAL LOW (ref 8.9–10.3)
Calcium: 7.9 mg/dL — ABNORMAL LOW (ref 8.9–10.3)
Chloride: 110 mmol/L (ref 98–111)
Chloride: 106 mmol/L (ref 98–111)
Creatinine, Ser: 1.14 mg/dL — ABNORMAL HIGH (ref 0.44–1.00)
Creatinine, Ser: 1.6 mg/dL — ABNORMAL HIGH (ref 0.44–1.00)
GFR, Estimated: 49 mL/min — ABNORMAL LOW (ref >60)
GFR, Estimated: 32 mL/min — ABNORMAL LOW (ref >60)
Glucose, Bld: 143 mg/dL — ABNORMAL HIGH (ref 70–99)
Glucose, Bld: 154 mg/dL — ABNORMAL HIGH (ref 70–99)
Potassium: 4.2 mmol/L (ref 3.5–5.1)
Potassium: 4.2 mmol/L (ref 3.5–5.1)
Sodium: 140 mmol/L (ref 135–145)
Sodium: 137 mmol/L (ref 135–145)

## 2022-08-27 LAB — GLUCOSE, CAPILLARY
Glucose-Capillary: 117 mg/dL — ABNORMAL HIGH (ref 70–99)
Glucose-Capillary: 123 mg/dL — ABNORMAL HIGH (ref 70–99)
Glucose-Capillary: 126 mg/dL — ABNORMAL HIGH (ref 70–99)
Glucose-Capillary: 137 mg/dL — ABNORMAL HIGH (ref 70–99)
Glucose-Capillary: 141 mg/dL — ABNORMAL HIGH (ref 70–99)
Glucose-Capillary: 151 mg/dL — ABNORMAL HIGH (ref 70–99)
Glucose-Capillary: 151 mg/dL — ABNORMAL HIGH (ref 70–99)
Glucose-Capillary: 153 mg/dL — ABNORMAL HIGH (ref 70–99)
Glucose-Capillary: 157 mg/dL — ABNORMAL HIGH (ref 70–99)
Glucose-Capillary: 157 mg/dL — ABNORMAL HIGH (ref 70–99)
Glucose-Capillary: 157 mg/dL — ABNORMAL HIGH (ref 70–99)
Glucose-Capillary: 159 mg/dL — ABNORMAL HIGH (ref 70–99)
Glucose-Capillary: 159 mg/dL — ABNORMAL HIGH (ref 70–99)
Glucose-Capillary: 159 mg/dL — ABNORMAL HIGH (ref 70–99)
Glucose-Capillary: 165 mg/dL — ABNORMAL HIGH (ref 70–99)
Glucose-Capillary: 177 mg/dL — ABNORMAL HIGH (ref 70–99)

## 2022-08-27 LAB — BPAM PLATELET PHERESIS
Blood Product Expiration Date: 202309052359
ISSUE DATE / TIME: 202309051121
Unit Type and Rh: 6200

## 2022-08-27 LAB — BPAM FFP
Blood Product Expiration Date: 202309062359
ISSUE DATE / TIME: 202309051125
Unit Type and Rh: 6200

## 2022-08-27 LAB — PREPARE FRESH FROZEN PLASMA: Unit division: 0

## 2022-08-27 LAB — ECHO INTRAOPERATIVE TEE
Height: 63 in
Weight: 2828.59 oz

## 2022-08-27 LAB — COOXEMETRY PANEL
Carboxyhemoglobin: 1.3 % (ref 0.5–1.5)
Methemoglobin: <0.7 % (ref 0.0–1.5)
O2 Saturation: 65.6 %
Total hemoglobin: 9.8 g/dL — ABNORMAL LOW (ref 12.0–16.0)

## 2022-08-27 MED ORDER — FUROSEMIDE 10 MG/ML IJ SOLN
20.0000 mg | Freq: Two times a day (BID) | INTRAMUSCULAR | Status: DC
  Administered 2022-08-27 – 2022-08-28 (×4): 20 mg via INTRAVENOUS
  Filled 2022-08-27 (×5): qty 2

## 2022-08-27 MED ORDER — LIDOCAINE 5 % EX PTCH
2.0000 | MEDICATED_PATCH | CUTANEOUS | Status: DC
  Administered 2022-08-27 – 2022-09-02 (×7): 2 via TRANSDERMAL
  Filled 2022-08-27 (×7): qty 2

## 2022-08-27 MED ORDER — SODIUM CHLORIDE 0.9 % IV SOLN
6.2500 mg | Freq: Four times a day (QID) | INTRAVENOUS | Status: DC | PRN
  Administered 2022-08-27: 6.25 mg via INTRAVENOUS
  Filled 2022-08-27: qty 0.25

## 2022-08-27 MED ORDER — INSULIN DETEMIR 100 UNIT/ML ~~LOC~~ SOLN
6.0000 [IU] | Freq: Two times a day (BID) | SUBCUTANEOUS | Status: DC
  Administered 2022-08-27 – 2022-09-02 (×12): 6 [IU] via SUBCUTANEOUS
  Filled 2022-08-27 (×16): qty 0.06

## 2022-08-27 MED ORDER — INSULIN ASPART 100 UNIT/ML IJ SOLN
0.0000 [IU] | INTRAMUSCULAR | Status: DC
  Administered 2022-08-27: 2 [IU] via SUBCUTANEOUS
  Administered 2022-08-27: 4 [IU] via SUBCUTANEOUS
  Administered 2022-08-27: 2 [IU] via SUBCUTANEOUS

## 2022-08-27 MED ORDER — GABAPENTIN 100 MG PO CAPS
200.0000 mg | ORAL_CAPSULE | Freq: Two times a day (BID) | ORAL | Status: DC
  Administered 2022-08-27 – 2022-09-02 (×13): 200 mg via ORAL
  Filled 2022-08-27 (×13): qty 2

## 2022-08-27 MED ORDER — VANCOMYCIN HCL IN DEXTROSE 1-5 GM/200ML-% IV SOLN: 1000.0000 mg | Freq: Once | INTRAVENOUS | Status: DC

## 2022-08-27 MED ORDER — VANCOMYCIN HCL IN DEXTROSE 1-5 GM/200ML-% IV SOLN
1000.0000 mg | Freq: Once | INTRAVENOUS | Status: AC
Start: 2022-08-27 — End: 2022-08-27
  Administered 2022-08-27: 1000 mg via INTRAVENOUS
  Filled 2022-08-27: qty 200

## 2022-08-27 MED ORDER — SIMETHICONE 80 MG PO CHEW
80.0000 mg | CHEWABLE_TABLET | Freq: Four times a day (QID) | ORAL | Status: DC
  Administered 2022-08-27 – 2022-09-01 (×18): 80 mg via ORAL
  Filled 2022-08-27 (×19): qty 1

## 2022-08-27 MED ORDER — MIDAZOLAM HCL 2 MG/2ML IJ SOLN: 1.0000 mg | INTRAMUSCULAR | Status: DC | PRN

## 2022-08-27 NOTE — Progress Notes (Signed)
Rounding Note    Patient Name: Lisa [name], Dr. Date of Encounter: 08/27/2022  [facility] Cardiologist: Debbe Odea, MD   Subjective   Uncomfortable volume overloaded   Inpatient Medications    Scheduled Meds:  acetaminophen  1,000 mg Oral Q6H   Or   acetaminophen (TYLENOL) oral liquid 160 mg/5 mL  1,000 mg Per Tube Q6H   aspirin EC  325 mg Oral Daily   Or   aspirin  324 mg Per Tube Daily   atorvastatin  80 mg Oral QHS   bisacodyl  10 mg Oral Daily   Or   bisacodyl  10 mg Rectal Daily   Chlorhexidine Gluconate Cloth  6 each Topical Daily   docusate sodium  200 mg Oral Daily   furosemide  20 mg Intravenous BID   gabapentin  200 mg Oral BID   insulin aspart  0-24 Units Subcutaneous Q4H   insulin detemir  6 Units Subcutaneous BID   lidocaine  2 patch Transdermal Q24H   metoCLOPramide (REGLAN) injection  5 mg Intravenous Q6H   metoprolol tartrate  12.5 mg Oral BID   Or   metoprolol tartrate  12.5 mg Per Tube BID   ondansetron (ZOFRAN) IV  4 mg Intravenous Q6H   [START ON 08/28/2022] pantoprazole  40 mg Oral Daily   sodium chloride flush  10-40 mL Intracatheter Q12H   sodium chloride flush  3 mL Intravenous Q12H   Continuous Infusions:  sodium chloride Stopped (08/27/22 0756)   sodium chloride     sodium chloride 10 mL/hr at 08/27/22 0900   albumin human Stopped (08/26/22 1901)    ceFAZolin (ANCEF) IV Stopped (08/27/22 0546)   insulin 2 Units/hr (08/27/22 0900)   lactated ringers Stopped (08/26/22 1558)   lactated ringers 20 mL/hr at 08/27/22 0900   nitroGLYCERIN Stopped (08/26/22 2213)   phenylephrine (NEO-SYNEPHRINE) Adult infusion Stopped (08/26/22 1503)   promethazine (PHENERGAN) injection (IM or IVPB)     vancomycin     PRN Meds: sodium chloride, albumin human, dextrose, fentaNYL (SUBLIMAZE) injection, metoprolol tartrate, midazolam, promethazine (PHENERGAN) injection (IM or IVPB), sodium chloride flush, sodium chloride flush, traMADol    Vital Signs    Vitals:   08/27/22 0645 08/27/22 0700 08/27/22 0800 08/27/22 0900  BP:      Pulse: 68 69 70 70  Resp: 14 20 12 13   Temp: 97.9 F (36.6 C) 97.7 F (36.5 C)    TempSrc:  Oral    SpO2: 94% 94% 93% 95%  Weight:      Height:        Intake/Output Summary (Last 24 hours) at 08/27/2022 0914 Last data filed at 08/27/2022 0900 Gross per 24 hour  Intake 5448.16 ml  Output 2842 ml  Net 2606.16 ml       08/27/2022    5:00 AM 08/20/2022    2:29 PM 08/19/2022    8:24 AM  Last 3 Weights  Weight (lbs) 181 lb 3.5 oz 176 lb 12.6 oz 176 lb 12.8 oz  Weight (kg) 82.2 kg 80.19 kg 80.196 kg      Telemetry    SR to SB - Personally Reviewed  ECG    No new - Personally Reviewed  Physical Exam   Right IJ Edemetous Chest tube in Sternal dressing  Plus one edema Temp wires in place  Rub on exam   Labs    High Sensitivity Troponin:  No results for input(s): "TROPONINIHS" in the last 720 hours.  Chemistry Recent Labs  Lab 08/21/22 0301 08/22/22 0527 08/26/22 0539 08/26/22 0812 08/26/22 1228 08/26/22 1231 08/26/22 1937 08/26/22 2040 08/26/22 2202 08/27/22 0407  NA 143   < > 143   < > 137   < > 139 138 140 140  K 4.3   < > 4.2   < > 5.7*   < > 4.1 5.0 4.1 4.2  CL 111   < > 107   < > 105  --  110  --   --  110  CO2 23   < > 23  --   --   --  20*  --   --  20*  GLUCOSE 112*   < > 117*   < > 178*  --  141*  --   --  143*  BUN 24*   < > 28*   < > 32*  --  23  --   --  21  CREATININE 1.14*   < > 1.16*   < > 0.90  --  1.08*  --   --  1.14*  CALCIUM 8.9   < > 9.9  --   --   --  7.9*  --   --  7.9*  MG  --   --   --   --   --   --  3.0*  --   --  2.7*  ALBUMIN 3.4*  --   --   --   --   --   --   --   --   --   GFRNONAA 49*   < > 48*  --   --   --  52*  --   --  49*  ANIONGAP 9   < > 13  --   --   --  9  --   --  10   < > = values in this interval not displayed.    Lipids  No results for input(s): "CHOL", "TRIG", "HDL", "LABVLDL", "LDLCALC", "CHOLHDL" in the last  168 hours.   Hematology Recent Labs  Lab 08/26/22 1355 08/26/22 1402 08/26/22 1937 08/26/22 2040 08/26/22 2202 08/27/22 0407  WBC 9.1  --  10.0  --   --  12.1*  RBC 3.11*  --  3.31*  --   --  3.22*  HGB 9.3*   < > 10.1* 9.9* 9.5* 9.8*  HCT 28.1*   < > 29.3* 29.0* 28.0* 29.2*  MCV 90.4  --  88.5  --   --  90.7  MCH 29.9  --  30.5  --   --  30.4  MCHC 33.1  --  34.5  --   --  33.6  RDW 13.3  --  13.4  --   --  13.7  PLT 135*  --  153  --   --  170   < > = values in this interval not displayed.   Thyroid  Recent Labs  Lab 08/21/22 0301  TSH 4.751*    BNPNo results for input(s): "BNP", "PROBNP" in the last 168 hours.  DDimer No results for input(s): "DDIMER" in the last 168 hours.   Radiology    DG Chest Port 1 View  Result Date: 08/27/2022 CLINICAL DATA:  Status post CABG.  Chest tube present.  Sore chest. EXAM: PORTABLE CHEST 1 VIEW COMPARISON:  One-view chest x-ray 08/26/2022 FINDINGS: The patient has been extubated. Swan-Ganz catheter is stable position. Mediastinal drain and left-sided chest tube remain in  place. Small left pleural effusion is present without pneumothorax. Lung volumes are low. Mild pulmonary vascular congestion is present. IMPRESSION: 1. Interval extubation. 2. Small left pleural effusion without pneumothorax. 3. Stable Swan-Ganz catheter and mediastinal drain. Electronically Signed   By: San Morelle M.D.   On: 08/27/2022 08:19   DG Chest Port 1 View  Result Date: 08/26/2022 CLINICAL DATA:  Status post coronary bypass surgery EXAM: PORTABLE CHEST 1 VIEW COMPARISON:  06/09/2022 FINDINGS: Transverse diameter of heart is increased. The central pulmonary vessels are prominent without signs of alveolar pulmonary edema. Small linear densities are seen in lower lung fields. There is interval coronary artery bypass surgery. Tip of endotracheal tube is 3.2 cm above the carina. Tip of right IJ Swan-Ganz catheter is noted in main pulmonary artery. Left chest tube  is noted. Enteric tube is noted traversing the esophagus. Tip of PICC line introduced through the right upper extremity is seen in the region of right atrium. There is minimal blunting of left lateral CP angle. There is no pneumothorax. IMPRESSION: There are small linear densities in the lower lung fields suggesting subsegmental atelectasis. Possible small left pleural effusion. Other findings as described in the body of the report. Electronically Signed   By: Elmer Picker M.D.   On: 08/26/2022 14:16    Cardiac Studies   Cath: 08/20/22     Mid LM to Ost LAD lesion is 90% stenosed.   Ost Cx to Prox Cx lesion is 99% stenosed.   Prox LAD to Mid LAD lesion is 30% stenosed.   Prox RCA lesion is 85% stenosed.   The left ventricular systolic function is normal.   LV end diastolic pressure is mildly elevated.   The left ventricular ejection fraction is 55-65% by visual estimate.   1.  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  The coronary arteries are heavily calcified. 2.  Normal LV systolic function mildly elevated left ventricular end-diastolic pressure.  No significant gradient across aortic valve.   Recommendations: Recommend CABG for revascularization.  Given the patient's symptoms at rest, recommend admission to the hospital and initiation of heparin drip.  Unfortunately, the patient is on clopidogrel therapy as an outpatient and this was discontinued today.  CABG can likely be performed early next week.  The coronary anatomy is not favorable for PCI.   Diagnostic Dominance: Co-dominant  Echo: 08/20/22   IMPRESSIONS     1. Left ventricular ejection fraction, by estimation, is 60 to 65%. The  left ventricle has normal function. The left ventricle has no regional  wall motion abnormalities. Left ventricular diastolic parameters are   consistent with Grade I diastolic  dysfunction (impaired relaxation).   2. Right ventricular systolic function is normal. The right ventricular  size is normal. There is normal pulmonary artery systolic pressure. The  estimated right ventricular systolic pressure is [id number] mmHg.   3. The mitral valve is normal in structure. Mild mitral valve  regurgitation. No evidence of mitral stenosis. Moderate mitral annular  calcification.   4. The aortic valve is tricuspid. There is mild calcification of the  aortic valve. There is mild thickening of the aortic valve. Aortic valve  regurgitation is not visualized. Aortic valve sclerosis/calcification is  present, without any evidence of  aortic stenosis.   5. The inferior vena cava is normal in size with greater than 50%  respiratory variability, suggesting  right atrial pressure of 3 mmHg.   Comparison(s): No significant change from prior study. Prior images  reviewed side by side.   Patient Profile     81 y.o. female past medical history of CAD, stroke, CKD stage II, hypertension, hyperlipidemia and GERD who was recently seen in the office for complaints of exertional dyspnea and chest discomfort.  Underwent outpatient coronary CTA which was abnormal and presented for cardiac cath and now plans for CABG  Assessment & Plan    CAD -Day one post CABG - Rhythm stable - diurese  - LIMA to LAD, SVG OM2 and SVG RCA and right GSV harvest - Post op ECG stable SR rate 68 - Hct 29.2 - Coox ok 65.6%     Signed, Jenkins Rouge, MD  08/27/2022, 9:14 AM

## 2022-08-27 NOTE — Progress Notes (Signed)
1 Day Post-Op Procedure(s) (LRB): CORONARY ARTERY BYPASS GRAFTING (CABG) X THREE, USING LEFT INTERNAL MAMMARY ARTERY AND RIGHT LEG GREATER SAPHENOUS VEIN HARVESTED ENDOSCOPICALLY (N/A) TRANSESOPHAGEAL ECHOCARDIOGRAM (TEE) (N/A) Subjective: Extubated 8 pm Now doing well but having nausea, pain  Objective: Vital signs in last 24 hours: Temp:  [96.1 F (35.6 C)-99.9 F (37.7 C)] 97.9 F (36.6 C) (09/06 0645) Pulse Rate:  [66-88] 68 (09/06 0645) Cardiac Rhythm: Normal sinus rhythm (09/06 0400) Resp:  [12-29] 14 (09/06 0645) BP: (96-149)/(52-65) 149/65 (09/05 1721) SpO2:  [91 %-99 %] 94 % (09/06 0645) Arterial Line BP: (98-159)/(49-72) 140/56 (09/06 0645) FiO2 (%):  [40 %-50 %] 40 % (09/05 2011) Weight:  [82.2 kg] 82.2 kg (09/06 0500)  Hemodynamic parameters for last 24 hours: PAP: (20-37)/(11-22) 30/15 CO:  [3.7 L/min-5.6 L/min] 4.5 L/min CI:  [2 L/min/m2-3 L/min/m2] 2.4 L/min/m2  Intake/Output from previous day: 09/05 0701 - 09/06 0700 In: 5677.2 [P.O.:100; I.V.:3016.2; Blood:987; IV Piggyback:1574.1] Out: 3067 [Urine:2060; Blood:647; Chest Tube:360] Intake/Output this shift: No intake/output data recorded.       Exam    General- alert and comfortable    Neck- no JVD, no cervical adenopathy palpable, no carotid bruit   Lungs- clear without rales, wheezes. No air leak   Cor- regular rate and rhythm, no murmur , gallop   Abdomen- soft, non-tender   Extremities - warm, non-tender, minimal edema   Neuro- oriented, appropriate, no focal weakness   Lab Results: Recent Labs    08/26/22 1937 08/26/22 2040 08/26/22 2202 08/27/22 0407  WBC 10.0  --   --  12.1*  HGB 10.1*   < > 9.5* 9.8*  HCT 29.3*   < > 28.0* 29.2*  PLT 153  --   --  170   < > = values in this interval not displayed.   BMET:  Recent Labs    08/26/22 1937 08/26/22 2040 08/26/22 2202 08/27/22 0407  NA 139   < > 140 140  K 4.1   < > 4.1 4.2  CL 110  --   --  110  CO2 20*  --   --  20*  GLUCOSE  141*  --   --  143*  BUN 23  --   --  21  CREATININE 1.08*  --   --  1.14*  CALCIUM 7.9*  --   --  7.9*   < > = values in this interval not displayed.    PT/INR:  Recent Labs    08/26/22 1355  LABPROT 17.9*  INR 1.5*   ABG    Component Value Date/Time   PHART 7.386 08/26/2022 2202   HCO3 22.7 08/26/2022 2202   TCO2 24 08/26/2022 2202   ACIDBASEDEF 2.0 08/26/2022 2202   O2SAT 65.6 08/27/2022 0407   CBG (last 3)  Recent Labs    08/27/22 0525 08/27/22 0638 08/27/22 0748  GLUCAP 177* 157* 153*    Assessment/Plan: S/P Procedure(s) (LRB): CORONARY ARTERY BYPASS GRAFTING (CABG) X THREE, USING LEFT INTERNAL MAMMARY ARTERY AND RIGHT LEG GREATER SAPHENOUS VEIN HARVESTED ENDOSCOPICALLY (N/A) TRANSESOPHAGEAL ECHOCARDIOGRAM (TEE) (N/A) Mobilize Diuresis Diabetes control See progression orders Avoid morphine, oxycodone   LOS: 7 days    Lovett Sox 08/27/2022

## 2022-08-27 NOTE — Progress Notes (Signed)
TCTS DAILY ICU PROGRESS NOTE                   [address]            [place] [address]          [phone]   1 Day Post-Op Procedure(s) (LRB): CORONARY ARTERY BYPASS GRAFTING (CABG) X THREE, USING LEFT INTERNAL MAMMARY ARTERY AND RIGHT LEG GREATER SAPHENOUS VEIN HARVESTED ENDOSCOPICALLY (N/A) TRANSESOPHAGEAL ECHOCARDIOGRAM (TEE) (N/A)  Total Length of Stay:  LOS: 7 days   Subjective: Extubated at about 8 PM last evening.  Awake and alert.  Having some nausea without vomiting or abdominal pain.  Milrinone at 0.125 mics per kilo per minute Co. ox 65 Cardiac index 2.4  Objective: Vital signs in last 24 hours: Temp:  [96.1 F (35.6 C)-99.9 F (37.7 C)] 97.9 F (36.6 C) (09/06 0645) Pulse Rate:  [66-88] 68 (09/06 0645) Cardiac Rhythm: Normal sinus rhythm (09/06 0400) Resp:  [12-29] 14 (09/06 0645) BP: (96-149)/(52-65) 149/65 (09/05 1721) SpO2:  [91 %-99 %] 94 % (09/06 0645) Arterial Line BP: (98-159)/(49-72) 140/56 (09/06 0645) FiO2 (%):  [40 %-50 %] 40 % (09/05 2011) Weight:  [82.2 kg] 82.2 kg (09/06 0500)  Filed Weights   08/20/22 1429 08/27/22 0500  Weight: 80.2 kg 82.2 kg    Weight change:    Hemodynamic parameters for last 24 hours: PAP: (20-37)/(11-22) 30/15 CO:  [3.7 L/min-5.6 L/min] 4.5 L/min CI:  [2 L/min/m2-3 L/min/m2] 2.4 L/min/m2  Intake/Output from previous day: 09/05 0701 - 09/06 0700 In: 5677.2 [P.O.:100; I.V.:3016.2; Blood:987; IV Piggyback:1574.1] Out: 3067 [Urine:2060; Blood:647; Chest Tube:360]  Intake/Output this shift: No intake/output data recorded.  Current Meds: Scheduled Meds:  acetaminophen  1,000 mg Oral Q6H   Or   acetaminophen (TYLENOL) oral liquid 160 mg/5 mL  1,000 mg Per Tube Q6H   aspirin EC  325 mg Oral Daily   Or   aspirin  324 mg Per Tube Daily   atorvastatin  80 mg Oral QHS   bisacodyl  10 mg Oral Daily   Or   bisacodyl  10 mg Rectal Daily   Chlorhexidine Gluconate Cloth  6 each Topical Daily    docusate sodium  200 mg Oral Daily   insulin aspart  0-24 Units Subcutaneous Q4H   insulin detemir  6 Units Subcutaneous BID   metoCLOPramide (REGLAN) injection  5 mg Intravenous Q6H   metoprolol tartrate  12.5 mg Oral BID   Or   metoprolol tartrate  12.5 mg Per Tube BID   ondansetron (ZOFRAN) IV  4 mg Intravenous Q6H   [START ON 08/28/2022] pantoprazole  40 mg Oral Daily   sodium chloride flush  10-40 mL Intracatheter Q12H   sodium chloride flush  3 mL Intravenous Q12H   Continuous Infusions:  sodium chloride 10 mL/hr at 08/27/22 0600   sodium chloride     sodium chloride 10 mL/hr at 08/27/22 0600   albumin human Stopped (08/26/22 1901)    ceFAZolin (ANCEF) IV Stopped (08/27/22 0546)   insulin 2.2 Units/hr (08/27/22 0600)   lactated ringers Stopped (08/26/22 1558)   lactated ringers 20 mL/hr at 08/27/22 0600   nitroGLYCERIN Stopped (08/26/22 2213)   phenylephrine (NEO-SYNEPHRINE) Adult infusion Stopped (08/26/22 1503)   vancomycin     PRN Meds:.sodium chloride, albumin human, dextrose, fentaNYL (SUBLIMAZE) injection, metoprolol tartrate, midazolam, sodium chloride flush, sodium chloride flush, traMADol  General appearance: alert, cooperative, and mild distress Neurologic: intact Heart: Regular rate and rhythm,  pericardial friction rub  (likely from the mediastinal tube) Lungs: Breath sounds shallow but clear.  Chest x-ray showing no unexpected perioperative changes.  Lung fields are clear, low volumes. Abdomen: Soft, no tenderness, bowel sounds present Extremities: All well perfused, SCDs in place to the lower extremities.  The right leg EVH incision is covered with a dry dressing Wound: The sternotomy incision is covered with a dry dressing  Lab Results: CBC: Recent Labs    08/26/22 1937 08/26/22 2040 08/26/22 2202 08/27/22 0407  WBC 10.0  --   --  12.1*  HGB 10.1*   < > 9.5* 9.8*  HCT 29.3*   < > 28.0* 29.2*  PLT 153  --   --  170   < > = values in this interval not  displayed.   BMET:  Recent Labs    08/26/22 1937 08/26/22 2040 08/26/22 2202 08/27/22 0407  NA 139   < > 140 140  K 4.1   < > 4.1 4.2  CL 110  --   --  110  CO2 20*  --   --  20*  GLUCOSE 141*  --   --  143*  BUN 23  --   --  21  CREATININE 1.08*  --   --  1.14*  CALCIUM 7.9*  --   --  7.9*   < > = values in this interval not displayed.    CMET: Lab Results  Component Value Date   WBC 12.1 (H) 08/27/2022   HGB 9.8 (L) 08/27/2022   HCT 29.2 (L) 08/27/2022   PLT 170 08/27/2022   GLUCOSE 143 (H) 08/27/2022   CHOL 171 08/19/2022   TRIG 160 (H) 08/19/2022   HDL 67 08/19/2022   LDLDIRECT 92 08/19/2022   LDLCALC 72 08/19/2022   ALT 19 08/19/2022   AST 20 08/19/2022   NA 140 08/27/2022   K 4.2 08/27/2022   CL 110 08/27/2022   CREATININE 1.14 (H) 08/27/2022   BUN 21 08/27/2022   CO2 20 (L) 08/27/2022   TSH 4.751 (H) 08/21/2022   INR 1.5 (H) 08/26/2022   HGBA1C 6.3 (H) 08/21/2022      PT/INR:  Recent Labs    08/26/22 1355  LABPROT 17.9*  INR 1.5*   Radiology: [facility] Chest Port 1 View  Result Date: 08/26/2022 CLINICAL DATA:  Status post coronary bypass surgery EXAM: PORTABLE CHEST 1 VIEW COMPARISON:  06/09/2022 FINDINGS: Transverse diameter of heart is increased. The central pulmonary vessels are prominent without signs of alveolar pulmonary edema. Small linear densities are seen in lower lung fields. There is interval coronary artery bypass surgery. Tip of endotracheal tube is 3.2 cm above the carina. Tip of right IJ Swan-Ganz catheter is noted in main pulmonary artery. Left chest tube is noted. Enteric tube is noted traversing the esophagus. Tip of PICC line introduced through the right upper extremity is seen in the region of right atrium. There is minimal blunting of left lateral CP angle. There is no pneumothorax. IMPRESSION: There are small linear densities in the lower lung fields suggesting subsegmental atelectasis. Possible small left pleural effusion. Other findings  as described in the body of the report. Electronically Signed   By: Ernie Avena M.D.   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  Hemodynamics stable on low-dose milrinone. ON ASA< statin, starting BB.  Plan to remove monitoring lines and mobilize.  Leave chest tubes for drainage.  -Pulm-stable respiratory status since extubation. O2 sats good on 2L/[place] Encouraging pulmonary hygiene.  Mobilize.  -Endo-no history of diabetes mellitus but preop hemoglobin A1c was 6.3.  Glucose well controlled.  Transition insulin drip to sliding scale today.  Continue to monitor.  -GI-postop nausea-abdominal exam is benign.  Continue PPI and scheduled Reglan every 6 hours.  Zofran as needed.  -Heme-expected acute blood loss anemia.  Hematocrit is stable and the chest tube drainage is tapering off.  Continue to monitor.  -RENAL- h/o CKD. Stable creat and UO. Wt is only 2kg +.     Antony Odea, PA-C 08/27/2022 7:50 AM

## 2022-08-27 NOTE — Anesthesia Postprocedure Evaluation (Signed)
Anesthesia Post Note  Patient: [name], Dr.  Nigel Bridgeman) Performed: CORONARY ARTERY BYPASS GRAFTING (CABG) X THREE, USING LEFT INTERNAL MAMMARY ARTERY AND RIGHT LEG GREATER SAPHENOUS VEIN HARVESTED ENDOSCOPICALLY (Chest) TRANSESOPHAGEAL ECHOCARDIOGRAM (TEE)     Patient location during evaluation: SICU Anesthesia Type: General Level of consciousness: sedated Pain management: pain level controlled Vital Signs Assessment: post-procedure vital signs reviewed and stable Respiratory status: patient remains intubated per anesthesia plan Cardiovascular status: stable Postop Assessment: no apparent nausea or vomiting Anesthetic complications: no   No notable events documented.  Last Vitals:  Vitals:   08/27/22 0800 08/27/22 0900  BP:    Pulse: 70 70  Resp: 12 13  Temp:    SpO2: 93% 95%    Last Pain:  Vitals:   08/27/22 0800  TempSrc:   PainSc: 6                  Collene Schlichter

## 2022-08-27 NOTE — Progress Notes (Signed)
CT surgery PM rounds  Patient's nausea has improved and she was able to walk in the hallway Maintaining sinus rhythm P.m. labs satisfactory with slight increase in creatinine Continue current care  Blood pressure 137/75, pulse 71, temperature 98.6 F (37 C), temperature source Oral, resp. rate 15, height 5\' 3"  (1.6 m), weight 82.2 kg, SpO2 93 %.

## 2022-08-28 ENCOUNTER — Inpatient Hospital Stay (HOSPITAL_COMMUNITY): Payer: Medicare Other

## 2022-08-28 LAB — CBC
HCT: 28.1 % — ABNORMAL LOW (ref 36.0–46.0)
Hemoglobin: 9.4 g/dL — ABNORMAL LOW (ref 12.0–15.0)
MCH: 30.8 pg (ref 26.0–34.0)
MCHC: 33.5 g/dL (ref 30.0–36.0)
MCV: 92.1 fL (ref 80.0–100.0)
Platelets: 150 10*3/uL (ref 150–400)
RBC: 3.05 MIL/uL — ABNORMAL LOW (ref 3.87–5.11)
RDW: 14.1 % (ref 11.5–15.5)
WBC: 11.5 10*3/uL — ABNORMAL HIGH (ref 4.0–10.5)
nRBC: 0 % (ref 0.0–0.2)

## 2022-08-28 LAB — GLUCOSE, CAPILLARY
Glucose-Capillary: 114 mg/dL — ABNORMAL HIGH (ref 70–99)
Glucose-Capillary: 125 mg/dL — ABNORMAL HIGH (ref 70–99)
Glucose-Capillary: 139 mg/dL — ABNORMAL HIGH (ref 70–99)
Glucose-Capillary: 97 mg/dL (ref 70–99)
Glucose-Capillary: 107 mg/dL — ABNORMAL HIGH (ref 70–99)

## 2022-08-28 LAB — BASIC METABOLIC PANEL
Anion gap: 9 (ref 5–15)
BUN: 31 mg/dL — ABNORMAL HIGH (ref 8–23)
CO2: 22 mmol/L (ref 22–32)
Calcium: 7.8 mg/dL — ABNORMAL LOW (ref 8.9–10.3)
Chloride: 106 mmol/L (ref 98–111)
Creatinine, Ser: 1.56 mg/dL — ABNORMAL HIGH (ref 0.44–1.00)
GFR, Estimated: 33 mL/min — ABNORMAL LOW (ref >60)
Glucose, Bld: 114 mg/dL — ABNORMAL HIGH (ref 70–99)
Potassium: 3.9 mmol/L (ref 3.5–5.1)
Sodium: 137 mmol/L (ref 135–145)

## 2022-08-28 MED ORDER — ONDANSETRON HCL 4 MG/2ML IJ SOLN
4.0000 mg | Freq: Four times a day (QID) | INTRAMUSCULAR | Status: DC | PRN
Start: 2022-08-28 — End: 2022-09-02
  Administered 2022-08-31 – 2022-09-01 (×2): 4 mg via INTRAVENOUS
  Filled 2022-08-28 (×2): qty 2

## 2022-08-28 MED ORDER — INSULIN ASPART 100 UNIT/ML IJ SOLN
0.0000 [IU] | Freq: Three times a day (TID) | INTRAMUSCULAR | Status: DC
  Administered 2022-08-28 (×2): 2 [IU] via SUBCUTANEOUS

## 2022-08-28 MED ORDER — METOLAZONE 5 MG PO TABS
5.0000 mg | ORAL_TABLET | Freq: Once | ORAL | Status: AC
Start: 2022-08-28 — End: 2022-08-28
  Administered 2022-08-28: 5 mg via ORAL
  Filled 2022-08-28: qty 1

## 2022-08-28 MED ORDER — ORAL CARE MOUTH RINSE: 15.0000 mL | OROMUCOSAL | Status: DC | PRN

## 2022-08-28 MED ORDER — ENOXAPARIN SODIUM 30 MG/0.3ML IJ SOSY
30.0000 mg | PREFILLED_SYRINGE | INTRAMUSCULAR | Status: DC
  Administered 2022-08-28 – 2022-09-01 (×5): 30 mg via SUBCUTANEOUS
  Filled 2022-08-28 (×5): qty 0.3

## 2022-08-28 NOTE — Discharge Instructions (Signed)

## 2022-08-28 NOTE — Progress Notes (Addendum)
TCTS DAILY ICU PROGRESS NOTE                   [address]            [place] [address]          [phone]   2 Days Post-Op Procedure(s) (LRB): CORONARY ARTERY BYPASS GRAFTING (CABG) X THREE, USING LEFT INTERNAL MAMMARY ARTERY AND RIGHT LEG GREATER SAPHENOUS VEIN HARVESTED ENDOSCOPICALLY (N/A) TRANSESOPHAGEAL ECHOCARDIOGRAM (TEE) (N/A)  Total Length of Stay:  LOS: 8 days   Subjective: Awake and alert.  Walked a full lap in the unit this morning.  She said nausea has completely resolved.  She has been tolerating clear liquids with no trouble.  No bowel function or flatus yet.  Milrinone is off.  Objective: Vital signs in last 24 hours: Temp:  [97.6 F (36.4 C)-98.6 F (37 C)] 98.6 F (37 C) (09/07 0400) Pulse Rate:  [67-76] 69 (09/07 0700) Cardiac Rhythm: Normal sinus rhythm (09/07 0400) Resp:  [11-23] 11 (09/07 0700) BP: (102-151)/(60-85) 126/69 (09/07 0700) SpO2:  [90 %-97 %] 94 % (09/07 0700) Arterial Line BP: (142-156)/(60-68) 144/64 (09/06 1300) Weight:  [81.7 kg] 81.7 kg (09/07 0500)  Filed Weights   08/20/22 1429 08/27/22 0500 08/28/22 0500  Weight: 80.2 kg 82.2 kg 81.7 kg    Weight change: -0.5 kg   Hemodynamic parameters for last 24 hours: PAP: (45)/(23) 45/23  Intake/Output from previous day: 09/06 0701 - 09/07 0700 In: 1156.6 [P.O.:340; I.V.:347; IV Piggyback:469.6] Out: 1880 [Urine:1560; Chest Tube:320]  Intake/Output this shift: No intake/output data recorded.  Current Meds: Scheduled Meds:  acetaminophen  1,000 mg Oral Q6H   Or   acetaminophen (TYLENOL) oral liquid 160 mg/5 mL  1,000 mg Per Tube Q6H   aspirin EC  325 mg Oral Daily   Or   aspirin  324 mg Per Tube Daily   atorvastatin  80 mg Oral QHS   bisacodyl  10 mg Oral Daily   Or   bisacodyl  10 mg Rectal Daily   Chlorhexidine Gluconate Cloth  6 each Topical Daily   docusate sodium  200 mg Oral Daily   furosemide  20 mg Intravenous BID   gabapentin  200 mg Oral BID    insulin aspart  0-24 Units Subcutaneous Q4H   insulin detemir  6 Units Subcutaneous BID   lidocaine  2 patch Transdermal Q24H   metoCLOPramide (REGLAN) injection  5 mg Intravenous Q6H   metoprolol tartrate  12.5 mg Oral BID   Or   metoprolol tartrate  12.5 mg Per Tube BID   ondansetron (ZOFRAN) IV  4 mg Intravenous Q6H   pantoprazole  40 mg Oral Daily   simethicone  80 mg Oral QID   sodium chloride flush  10-40 mL Intracatheter Q12H   sodium chloride flush  3 mL Intravenous Q12H   Continuous Infusions:  sodium chloride Stopped (08/27/22 1454)   sodium chloride Stopped (08/27/22 2007)   sodium chloride Stopped (08/27/22 2008)   insulin Stopped (08/27/22 1310)   lactated ringers Stopped (08/26/22 1558)   lactated ringers Stopped (08/27/22 1310)   nitroGLYCERIN Stopped (08/26/22 2213)   phenylephrine (NEO-SYNEPHRINE) Adult infusion Stopped (08/26/22 1503)   promethazine (PHENERGAN) injection (IM or IVPB) Stopped (08/27/22 0934)   PRN Meds:.sodium chloride, dextrose, fentaNYL (SUBLIMAZE) injection, metoprolol tartrate, midazolam, promethazine (PHENERGAN) injection (IM or IVPB), sodium chloride flush, sodium chloride flush, traMADol  General appearance: alert, cooperative, and no distress Neurologic: intact Heart: Regular rate  and rhythm Lungs: Breath sounds full and clear.  Chest x-ray stable, mild left basilar volume loss.  Lung fields are clear.   Abdomen: Soft, no tenderness, bowel sounds present Extremities: All well perfused,  Mild LE edema.    Wounds: The right leg EVH incision and the sternotomy incision are covered with dry dressings.  Lab Results: CBC: Recent Labs    08/27/22 1650 08/28/22 0500  WBC 13.7* 11.5*  HGB 10.0* 9.4*  HCT 30.1* 28.1*  PLT 184 150    BMET:  Recent Labs    08/27/22 1650 08/28/22 0500  NA 137 137  K 4.2 3.9  CL 106 106  CO2 21* 22  GLUCOSE 154* 114*  BUN 27* 31*  CREATININE 1.60* 1.56*  CALCIUM 7.9* 7.8*     CMET: Lab  Results  Component Value Date   WBC 11.5 (H) 08/28/2022   HGB 9.4 (L) 08/28/2022   HCT 28.1 (L) 08/28/2022   PLT 150 08/28/2022   GLUCOSE 114 (H) 08/28/2022   CHOL 171 08/19/2022   TRIG 160 (H) 08/19/2022   HDL 67 08/19/2022   LDLDIRECT 92 08/19/2022   LDLCALC 72 08/19/2022   ALT 19 08/19/2022   AST 20 08/19/2022   NA 137 08/28/2022   K 3.9 08/28/2022   CL 106 08/28/2022   CREATININE 1.56 (H) 08/28/2022   BUN 31 (H) 08/28/2022   CO2 22 08/28/2022   TSH 4.751 (H) 08/21/2022   INR 1.5 (H) 08/26/2022   HGBA1C 6.3 (H) 08/21/2022      PT/INR:  Recent Labs    08/26/22 1355  LABPROT 17.9*  INR 1.5*    Radiology: No results found.   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  Vital signs and cardiac rhythm are stable.  On ASA, statin, BB.  Progressing with mobility.  Should be able to get chest tubes out today.  -Pulm-stable respiratory status.  O2 sats good on 2L/[place]. Encouraging pulmonary hygiene.  She would like to resume her CPAP at night.  -Endo-no history of diabetes mellitus but preop hemoglobin A1c was 6.3.  Glucose remains well controlled.  Continue SSI and Levemir twice daily  -GI-postop nausea-resolved.  Abdominal exam remains benign.  Continue PPI and scheduled Reglan every 6 hours for another day.  Zofran as needed.  -Heme-expected acute blood loss anemia.  Hematocrit is stable and the chest tube drainage is tapering off.  Continue to monitor.  -RENAL- h/o stage II CKD.  Mild increase in creatinine.  Urine output 1.5 L past 24 hours.  Monitor.    Leary Roca, PA-C 08/28/2022 7:25 AM  Progressing well DC lines, drains and cont  diuresis LSW eval for SNF  patient examined and medical record reviewed,agree with above note. Lovett Sox 08/28/2022

## 2022-08-28 NOTE — Progress Notes (Signed)
EVENING ROUNDS NOTE :     [address]       [place] [address]             [phone]                 2 Days Post-Op Procedure(s) (LRB): CORONARY ARTERY BYPASS GRAFTING (CABG) X THREE, USING LEFT INTERNAL MAMMARY ARTERY AND RIGHT LEG GREATER SAPHENOUS VEIN HARVESTED ENDOSCOPICALLY (N/A) TRANSESOPHAGEAL ECHOCARDIOGRAM (TEE) (N/A)   Total Length of Stay:  LOS: 8 days  Events:   No events Visiting with family    BP 117/64   Pulse 89   Temp 98.3 F (36.8 C)   Resp 15   Ht 5\' 3"  (1.6 m)   Wt 81.7 kg   SpO2 95%   BMI 31.91 kg/m          sodium chloride Stopped (08/27/22 2007)   nitroGLYCERIN Stopped (08/26/22 2213)   phenylephrine (NEO-SYNEPHRINE) Adult infusion Stopped (08/26/22 1503)   promethazine (PHENERGAN) injection (IM or IVPB) Stopped (08/27/22 0934)    I/O last 3 completed shifts: In: 2433.1 [P.O.:440; I.V.:872.7; IV Piggyback:1120.5] Out: 2550 [Urine:1990; Chest Tube:560]      Latest Ref Rng & Units 08/28/2022    5:00 AM 08/27/2022    4:50 PM 08/27/2022    4:07 AM  CBC  WBC 4.0 - 10.5 K/uL 11.5  13.7  12.1   Hemoglobin 12.0 - 15.0 g/dL 9.4  10/27/2022  9.8   Hematocrit 36.0 - 46.0 % 28.1  30.1  29.2   Platelets 150 - 400 K/uL 150  184  170        Latest Ref Rng & Units 08/28/2022    5:00 AM 08/27/2022    4:50 PM 08/27/2022    4:07 AM  BMP  Glucose 70 - 99 mg/dL 10/27/2022  628  366   BUN 8 - 23 mg/dL 31  27  21    Creatinine 0.44 - 1.00 mg/dL 294   7.65   Sodium 135 - 145 mmol/L 137  137  140   Potassium 3.5 - 5.1 mmol/L 3.9  4.2  4.2   Chloride 98 - 111 mmol/L 106  106  110   CO2 22 - 32 mmol/L 22  21  20    Calcium 8.9 - 10.3 mg/dL 7.8  7.9  7.9     ABG    Component Value Date/Time   PHART 7.386 08/26/2022 2202   PCO2ART 38.0 08/26/2022 2202   PO2ART 89 08/26/2022 2202   HCO3 22.7 08/26/2022 2202   TCO2 24 08/26/2022 2202   ACIDBASEDEF 2.0 08/26/2022 2202   O2SAT 65.6 08/27/2022 0407       10/26/2022, MD 08/28/2022  5:27 PM

## 2022-08-28 NOTE — Progress Notes (Signed)
TOC following patient. Awaiting PT/OT recommendations. CSW will continue to follow and assist with patients dc planning needs.

## 2022-08-28 NOTE — Progress Notes (Signed)
Rounding Note    Patient Name: Lisa [name], Dr. Date of Encounter: 08/28/2022  [place] Cardiologist: Kate Sable, MD   Subjective   Volume a bit better sitting in chair moderate pain still   Inpatient Medications    Scheduled Meds:  acetaminophen  1,000 mg Oral Q6H   Or   acetaminophen (TYLENOL) oral liquid 160 mg/5 mL  1,000 mg Per Tube Q6H   aspirin EC  325 mg Oral Daily   Or   aspirin  324 mg Per Tube Daily   atorvastatin  80 mg Oral QHS   bisacodyl  10 mg Oral Daily   Or   bisacodyl  10 mg Rectal Daily   Chlorhexidine Gluconate Cloth  6 each Topical Daily   docusate sodium  200 mg Oral Daily   enoxaparin (LOVENOX) injection  30 mg Subcutaneous Q24H   furosemide  20 mg Intravenous BID   gabapentin  200 mg Oral BID   insulin aspart  0-24 Units Subcutaneous TID AC   insulin detemir  6 Units Subcutaneous BID   lidocaine  2 patch Transdermal Q24H   metoCLOPramide (REGLAN) injection  5 mg Intravenous Q6H   metolazone  5 mg Oral Once   metoprolol tartrate  12.5 mg Oral BID   Or   metoprolol tartrate  12.5 mg Per Tube BID   pantoprazole  40 mg Oral Daily   simethicone  80 mg Oral QID   sodium chloride flush  10-40 mL Intracatheter Q12H   sodium chloride flush  3 mL Intravenous Q12H   Continuous Infusions:  sodium chloride Stopped (08/27/22 1454)   sodium chloride Stopped (08/27/22 2007)   lactated ringers Stopped (08/26/22 1558)   lactated ringers Stopped (08/27/22 1310)   nitroGLYCERIN Stopped (08/26/22 2213)   phenylephrine (NEO-SYNEPHRINE) Adult infusion Stopped (08/26/22 1503)   promethazine (PHENERGAN) injection (IM or IVPB) Stopped (08/27/22 0934)   PRN Meds: sodium chloride, fentaNYL (SUBLIMAZE) injection, metoprolol tartrate, ondansetron (ZOFRAN) IV, mouth rinse, promethazine (PHENERGAN) injection (IM or IVPB), sodium chloride flush, sodium chloride flush, traMADol   Vital Signs    Vitals:   08/28/22 0610 08/28/22 0700 08/28/22  0800 08/28/22 0900  BP: 119/68 126/69 (!) 140/77 (!) 134/53  Pulse: 72 69 68 (!) 40  Resp: 12 11 11 12   Temp:   98.5 F (36.9 C)   TempSrc:      SpO2: 93% 94% 96% 95%  Weight:      Height:        Intake/Output Summary (Last 24 hours) at 08/28/2022 0911 Last data filed at 08/28/2022 0900 Gross per 24 hour  Intake 1352.7 ml  Output 2230 ml  Net -877.3 ml       08/28/2022    5:00 AM 08/27/2022    5:00 AM 08/20/2022    2:29 PM  Last 3 Weights  Weight (lbs) 180 lb 1.9 oz 181 lb 3.5 oz 176 lb 12.6 oz  Weight (kg) 81.7 kg 82.2 kg 80.19 kg      Telemetry    SR to SB - Personally Reviewed  ECG    No new - Personally Reviewed  Physical Exam   Right IJ Edemetous Chest tube in Sternal dressing  Plus one edema Temp wires in place  Rub on exam   Labs    High Sensitivity Troponin:  No results for input(s): "TROPONINIHS" in the last 720 hours.   Chemistry Recent Labs  Lab 08/26/22 1937 08/26/22 2040 08/27/22 0407 08/27/22 1650 08/28/22 0500  NA 139   < > 140 137 137  K 4.1   < > 4.2 4.2 3.9  CL 110  --  110 106 106  CO2 20*  --  20* 21* 22  GLUCOSE 141*  --  143* 154* 114*  BUN 23  --  21 27* 31*  CREATININE 1.08*  --  1.14* 1.60* 1.56*  CALCIUM 7.9*  --  7.9* 7.9* 7.8*  MG 3.0*  --  2.7* 2.7*  --   GFRNONAA 52*  --  49* 32* 33*  ANIONGAP 9  --  10 10 9    < > = values in this interval not displayed.    Lipids  No results for input(s): "CHOL", "TRIG", "HDL", "LABVLDL", "LDLCALC", "CHOLHDL" in the last 168 hours.   Hematology Recent Labs  Lab 08/27/22 0407 08/27/22 1650 08/28/22 0500  WBC 12.1* 13.7* 11.5*  RBC 3.22* 3.29* 3.05*  HGB 9.8* 10.0* 9.4*  HCT 29.2* 30.1* 28.1*  MCV 90.7 91.5 92.1  MCH 30.4 30.4 30.8  MCHC 33.6 33.2 33.5  RDW 13.7 13.9 14.1  PLT 170 184 150   Thyroid  No results for input(s): "TSH", "FREET4" in the last 168 hours.   BNPNo results for input(s): "BNP", "PROBNP" in the last 168 hours.  DDimer No results for input(s):  "DDIMER" in the last 168 hours.   Radiology    DG Chest Port 1 View  Result Date: 08/28/2022 CLINICAL DATA:  Status post coronary artery bypass surgery EXAM: PORTABLE CHEST 1 VIEW COMPARISON:  Previous studies including the examination of 08/27/2022 FINDINGS: Transverse diameter of heart is increased. Recent coronary artery bypass surgery. There is poor inspiration. Small linear densities are seen in lower lung fields. There are no signs of alveolar pulmonary edema or new focal infiltrates. There is interval removal of Swan-Ganz catheter. There is a right IJ venous sheath with its tip in superior vena cava. Tip of right PICC line is seen in the course of superior vena cava. Mediastinal drain and left chest tube have not changed. There is no significant pleural effusion. There is no pneumothorax. IMPRESSION: Small linear densities in lower lung fields suggest subsegmental atelectasis. There are no signs of alveolar pulmonary edema or new focal infiltrates. Other findings as described in the body of the report. Electronically Signed   By: 10/27/2022 M.D.   On: 08/28/2022 08:23   DG Chest Port 1 View  Result Date: 08/27/2022 CLINICAL DATA:  Status post CABG.  Chest tube present.  Sore chest. EXAM: PORTABLE CHEST 1 VIEW COMPARISON:  One-view chest x-ray 08/26/2022 FINDINGS: The patient has been extubated. Swan-Ganz catheter is stable position. Mediastinal drain and left-sided chest tube remain in place. Small left pleural effusion is present without pneumothorax. Lung volumes are low. Mild pulmonary vascular congestion is present. IMPRESSION: 1. Interval extubation. 2. Small left pleural effusion without pneumothorax. 3. Stable Swan-Ganz catheter and mediastinal drain. Electronically Signed   By: 10/26/2022 M.D.   On: 08/27/2022 08:19   DG Chest Port 1 View  Result Date: 08/26/2022 CLINICAL DATA:  Status post coronary bypass surgery EXAM: PORTABLE CHEST 1 VIEW COMPARISON:  06/09/2022  FINDINGS: Transverse diameter of heart is increased. The central pulmonary vessels are prominent without signs of alveolar pulmonary edema. Small linear densities are seen in lower lung fields. There is interval coronary artery bypass surgery. Tip of endotracheal tube is 3.2 cm above the carina. Tip of right IJ Swan-Ganz catheter is noted in main pulmonary artery. Left chest  tube is noted. Enteric tube is noted traversing the esophagus. Tip of PICC line introduced through the right upper extremity is seen in the region of right atrium. There is minimal blunting of left lateral CP angle. There is no pneumothorax. IMPRESSION: There are small linear densities in the lower lung fields suggesting subsegmental atelectasis. Possible small left pleural effusion. Other findings as described in the body of the report. Electronically Signed   By: Ernie Avena M.D.   On: 08/26/2022 14:16    Cardiac Studies   Cath: 08/20/22     Mid LM to Ost LAD lesion is 90% stenosed.   Ost Cx to Prox Cx lesion is 99% stenosed.   Prox LAD to Mid LAD lesion is 30% stenosed.   Prox RCA lesion is 85% stenosed.   The left ventricular systolic function is normal.   LV end diastolic pressure is mildly elevated.   The left ventricular ejection fraction is 55-65% by visual estimate.   1.  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  The coronary arteries are heavily calcified. 2.  Normal LV systolic function mildly elevated left ventricular end-diastolic pressure.  No significant gradient across aortic valve.   Recommendations: Recommend CABG for revascularization.  Given the patient's symptoms at rest, recommend admission to the hospital and initiation of heparin drip.  Unfortunately, the patient is on clopidogrel therapy as an outpatient and this was discontinued today.  CABG can likely  be performed early next week.  The coronary anatomy is not favorable for PCI.   Diagnostic Dominance: Co-dominant  Echo: 08/20/22   IMPRESSIONS     1. Left ventricular ejection fraction, by estimation, is 60 to 65%. The  left ventricle has normal function. The left ventricle has no regional  wall motion abnormalities. Left ventricular diastolic parameters are  consistent with Grade I diastolic  dysfunction (impaired relaxation).   2. Right ventricular systolic function is normal. The right ventricular  size is normal. There is normal pulmonary artery systolic pressure. The  estimated right ventricular systolic pressure is 27.2 mmHg.   3. The mitral valve is normal in structure. Mild mitral valve  regurgitation. No evidence of mitral stenosis. Moderate mitral annular  calcification.   4. The aortic valve is tricuspid. There is mild calcification of the  aortic valve. There is mild thickening of the aortic valve. Aortic valve  regurgitation is not visualized. Aortic valve sclerosis/calcification is  present, without any evidence of  aortic stenosis.   5. The inferior vena cava is normal in size with greater than 50%  respiratory variability, suggesting right atrial pressure of 3 mmHg.   Comparison(s): No significant change from prior study. Prior images  reviewed side by side.   Patient Profile     81 y.o. female past medical history of CAD, stroke, CKD stage II, hypertension, hyperlipidemia and GERD who was recently seen in the office for complaints of exertional dyspnea and chest discomfort.  Underwent outpatient coronary CTA which was abnormal and presented for cardiac cath and now plans for CABG  Assessment & Plan    CAD -Day 2 post CABG - Rhythm stable - diurese  - LIMA to LAD, SVG OM2 and SVG RCA and right GSV harvest - Post op ECG stable SR rate 68 - Hct 28.1 - Coox ok 65.6%     Signed, Charlton Haws, MD  08/28/2022, 9:11 AM

## 2022-08-28 NOTE — Discharge Summary (Incomplete)
Physician Discharge Summary  Patient ID: Lisa [name], Dr. MRN: [id number] DOB/AGE: [birth year] 80 y.o.  Admit date: 08/20/2022 Discharge date: 08/28/2022  Admission Diagnoses:  Hypercholesterolemia History of CVA CKD, stage IIMultivessel coronary artery disease Unstable angina pectoris History of spinal stenosis Hypertension Gastroesophageal reflux disease  Discharge Diagnoses:   Hypercholesterolemia History of CVA CKD, stage IIMultivessel coronary artery disease Unstable angina pectoris History of spinal stenosis Hypertension Gastroesophageal reflux disease S/P CABG x 3   Discharged Condition: {condition:18240}  Referring: Iran Ouch, MD Primary Care: Thana Ates, MD Primary Cardiologist:Brian Agbor-Etang, MD  History of Present Illness:   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  She has been having exertional symptoms of extreme shortness of breath and neck discomfort.  Cardiac CT was followed by cardiac catheterization which confirmed significant left main stenosis, proximal ostial 90% circumflex stenosis and 85% proximal RCA stenosis.  LV systolic function is well-preserved.  An echocardiogram she has some thickening of the aortic valve/aortic sclerosis but no significant gradient, no aortic stenosis.  Per cardiology team has recommended surgical coronary revascularization.   The patient has been living at a high functional level independently in her villa at a retirement  center in [place].  She uses a cane to walk due to spinal stenosis and low back arthritis.  She has had low back surgery in several low back injections.  She was scheduled to see her spinal surgeon when she started developing her symptoms of angina.   Patient had a mild stroke of the left hemisphere 2 years ago with full recovery.  She has been on Plavix since then.   Her  other medical problems include sleep apnea using CPAP, dyslipidemia, GERD and mild chronic kidney disease.   She has no symptoms of other vascular disease and pre-CABG Dopplers show no obstructive carotid or peripheral artery disease.    The patient meets criteria for benefit from multivessel CABG after Plavix washout which will be scheduled for Tuesday, September 5.  Results of her pre-CABG Dopplers and PFTs and orthopantogram were reviewed and were satisfactory.  She understands she will need to remain in the hospital on IV heparin until surgery.  Hospital Course:  Ms. [place]mouth remained stable on a heparin infusion while awaiting Plavix washout. She was taken to the OR on 08/26/22 where CABG x 3 was carried out without complication.  Following surgery, she was taken to the surgical ICU on milrinone.  Vital signs, hemodynamics, and cardiac rhythm all remained stable.  She was extubated routinely.  She had significant nausea on the first postoperative day.  With limited p.o. intake and started her on scheduled Reglan along with Protonix and Zofran as needed.  She was mobilized.  The milrinone was weaned off without any difficulty.  Monitoring lines and Foley catheter were removed.  By the second postoperative day.  She was ambulating a full lap in the unit.  The nausea had resolved.  Her diet was advanced and well-tolerated.  Consults: {consultation:18241}  Significant Diagnostic Studies: {diagnostics:18242}  Treatments: Surgery Operative Report    DATE OF PROCEDURE: 08/26/2022   OPERATION: 1.  Coronary artery bypass grafting x3 (left internal mammary artery to LAD, saphenous vein graft to OM2, saphenous vein graft to RCA). 2.  Endoscopic harvest of right leg greater saphenous vein.   SURGEON:  Mikey Bussing, MD   ASSISTANT:  Jillyn Hidden, PA-C.   ANESTHESIA:  General  by Dr. Merilynn Finland.   PREOPERATIVE DIAGNOSES:  Severe left main and 3-vessel coronary artery disease with unstable  angina, chronic Plavix therapy for previous mini stroke, prediabetes, mild renal insufficiency.   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    Discharge Exam: Blood pressure 117/64, pulse 71, temperature 98.4 F (36.9 C), resp. rate 13, height 5\' 3"  (1.6 m), weight 81.7 kg, SpO2 95 %. {physical  Disposition:    Allergies as of 08/28/2022       Reactions   Other Other (See Comments)   Anesthesia causes nausea and vomiting, memory difficulty and problems processing.   Oxycodone Nausea Only   Sulfa Antibiotics Rash   Sulfasalazine Rash     Med Rec must be completed prior to using this [facility]***        The patient has been discharged on:   1.Beta Blocker:  Yes [   ]                              No   [   ]                              If No, reason:  2.Ace Inhibitor/ARB: Yes [   ]                                     No  [    ]                                     If No, reason:  3.Statin:   Yes [ x ]                  No  [   ]                  If No, reason:  4.Ecasa:  Yes  [ x ]                  No   [   ]                  If No, reason:  5. ACS on Admission?   P2Y12 Inhibitor:  Yes  [   ]                                No  [  ]    Signed: Quantavius Humm G. Angelisse Riso 08/28/2022, 3:11 PM

## 2022-08-29 ENCOUNTER — Inpatient Hospital Stay (HOSPITAL_COMMUNITY): Payer: Medicare Other

## 2022-08-29 LAB — TYPE AND SCREEN
ABO/RH(D): A POS
Antibody Screen: NEGATIVE
Unit division: 0
Unit division: 0
Unit division: 0
Unit division: 0

## 2022-08-29 LAB — BASIC METABOLIC PANEL
Anion gap: 10 (ref 5–15)
BUN: 33 mg/dL — ABNORMAL HIGH (ref 8–23)
CO2: 25 mmol/L (ref 22–32)
Calcium: 8.4 mg/dL — ABNORMAL LOW (ref 8.9–10.3)
Chloride: 104 mmol/L (ref 98–111)
Creatinine, Ser: 1.52 mg/dL — ABNORMAL HIGH (ref 0.44–1.00)
GFR, Estimated: 34 mL/min — ABNORMAL LOW (ref >60)
Glucose, Bld: 105 mg/dL — ABNORMAL HIGH (ref 70–99)
Potassium: 3.8 mmol/L (ref 3.5–5.1)
Sodium: 139 mmol/L (ref 135–145)

## 2022-08-29 LAB — BPAM RBC
Blood Product Expiration Date: 202309222359
Blood Product Expiration Date: 202309222359
Blood Product Expiration Date: 202309252359
Blood Product Expiration Date: 202309292359
ISSUE DATE / TIME: 202309050830
ISSUE DATE / TIME: 202309050830
Unit Type and Rh: 6200
Unit Type and Rh: 6200
Unit Type and Rh: 6200
Unit Type and Rh: 6200

## 2022-08-29 LAB — CBC
HCT: 28.3 % — ABNORMAL LOW (ref 36.0–46.0)
Hemoglobin: 9.4 g/dL — ABNORMAL LOW (ref 12.0–15.0)
MCH: 30.2 pg (ref 26.0–34.0)
MCHC: 33.2 g/dL (ref 30.0–36.0)
MCV: 91 fL (ref 80.0–100.0)
Platelets: 154 10*3/uL (ref 150–400)
RBC: 3.11 MIL/uL — ABNORMAL LOW (ref 3.87–5.11)
RDW: 13.8 % (ref 11.5–15.5)
WBC: 8.1 10*3/uL (ref 4.0–10.5)
nRBC: 0 % (ref 0.0–0.2)

## 2022-08-29 LAB — GLUCOSE, CAPILLARY
Glucose-Capillary: 97 mg/dL (ref 70–99)
Glucose-Capillary: 104 mg/dL — ABNORMAL HIGH (ref 70–99)
Glucose-Capillary: 115 mg/dL — ABNORMAL HIGH (ref 70–99)

## 2022-08-29 MED ORDER — FUROSEMIDE 40 MG PO TABS
40.0000 mg | ORAL_TABLET | Freq: Every day | ORAL | Status: DC
  Administered 2022-08-29 – 2022-08-30 (×2): 40 mg via ORAL
  Filled 2022-08-29 (×2): qty 1

## 2022-08-29 MED ORDER — FENTANYL CITRATE PF 50 MCG/ML IJ SOSY: 50.0000 ug | PREFILLED_SYRINGE | INTRAMUSCULAR | Status: DC | PRN

## 2022-08-29 NOTE — Evaluation (Signed)
Occupational Therapy Evaluation Patient Details Name: Lisa [name], Dr. MRN: [id number] DOB: July 27, 1941 Today's Date: 08/29/2022   History of Present Illness 81 yo female admitted 8/30 with CAD. Pt s/p Cath 8/30 and CABG x 3 9/5. PMhx: CVA, HLD, HTN, GERD   Clinical Impression   Patient admitted for the procedure above.  PTA she lives in a one story home at a local ILF/ALF.  She remains active and independent with respect to ADL, iADL and mobility.  Expected post op discomfort and generalized weakness are the primary deficits.  She currently needing up to Mod A for ADL completion, up to Mod A for bed mobility, and Min Guard for basic mobility.  OT will follow in the acute setting, with SNF recommended for post acute rehab prior to returning home alone.        Recommendations for follow up therapy are one component of a multi-disciplinary discharge planning process, led by the attending physician.  Recommendations may be updated based on patient status, additional functional criteria and insurance authorization.   Follow Up Recommendations  Skilled nursing-short term rehab (<3 hours/day)    Assistance Recommended at Discharge Frequent or constant Supervision/Assistance  Patient can return home with the following A little help with walking and/or transfers;A lot of help with bathing/dressing/bathroom;Assist for transportation;Assistance with cooking/housework    Functional Status Assessment  Patient has had a recent decline in their functional status and demonstrates the ability to make significant improvements in function in a reasonable and predictable amount of time.  Equipment Recommendations  None recommended by OT    Recommendations for Other Services       Precautions / Restrictions Precautions Precautions: Sternal;Fall;Other (comment) Precaution Booklet Issued: Yes (comment) Precaution Comments: watch sats Restrictions Weight Bearing Restrictions: Yes      Mobility Bed  Mobility Overal bed mobility: Needs Assistance Bed Mobility: Sidelying to Sit   Sidelying to sit: Min assist, HOB elevated     Sit to sidelying: Min assist, Mod assist      Transfers Overall transfer level: Needs assistance Equipment used: Rolling walker (2 wheels) Transfers: Sit to/from Stand Sit to Stand: Supervision                  Balance Overall balance assessment: Needs assistance Sitting-balance support: Feet supported Sitting balance-Leahy Scale: Good     Standing balance support: Bilateral upper extremity supported Standing balance-Leahy Scale: Poor                             ADL either performed or assessed with clinical judgement   ADL       Grooming: Wash/dry hands;Wash/dry face;Min guard;Standing           Upper Body Dressing : Moderate assistance;Sitting   Lower Body Dressing: Moderate assistance;Sit to/from stand   Toilet Transfer: Min guard;Rolling walker (2 wheels);Ambulation   Toileting- Clothing Manipulation and Hygiene: Supervision/safety;Sitting/lateral lean               Vision Patient Visual Report: No change from baseline       Perception Perception Perception: Within Functional Limits   Praxis Praxis Praxis: Intact    Pertinent Vitals/Pain Pain Assessment Pain Assessment: Faces Faces Pain Scale: Hurts little more Pain Location: R shoulder blade and low back Pain Descriptors / Indicators: Aching Pain Intervention(s): Monitored during session     Hand Dominance Left   Extremity/Trunk Assessment Upper Extremity Assessment Upper Extremity Assessment: Overall WFL for  tasks assessed   Lower Extremity Assessment Lower Extremity Assessment: Defer to PT evaluation   Cervical / Trunk Assessment Cervical / Trunk Assessment: Kyphotic   Communication Communication Communication: No difficulties   Cognition Arousal/Alertness: Awake/alert Behavior During Therapy: WFL for tasks  assessed/performed Overall Cognitive Status: Within Functional Limits for tasks assessed                                       General Comments   VSS on supplemental O2    Exercises     Shoulder Instructions      Home Living Family/patient expects to be discharged to:: Private residence Living Arrangements: Alone Available Help at Discharge: Friend(s);Available PRN/intermittently Type of Home: House Home Access: Level entry     Home Layout: One level     Bathroom Shower/Tub: Walk-in shower;Tub/shower unit   Bathroom Toilet: Handicapped height Bathroom Accessibility: Yes How Accessible: Accessible via walker Home Equipment: Cane - single point          Prior Functioning/Environment Prior Level of Function : Independent/Modified Independent                        OT Problem List: Decreased strength;Decreased activity tolerance;Impaired balance (sitting and/or standing)      OT Treatment/Interventions: Self-care/ADL training;Balance training;Therapeutic activities;DME and/or AE instruction;Patient/family education    OT Goals(Current goals can be found in the care plan section) Acute Rehab OT Goals Patient Stated Goal: Go to rehab and regain my independence OT Goal Formulation: With patient Time For Goal Achievement: 09/12/22 Potential to Achieve Goals: Good ADL Goals Pt Will Perform Grooming: with set-up;standing Pt Will Perform Upper Body Dressing: with supervision;sitting Pt Will Perform Lower Body Dressing: with supervision;sit to/from stand Pt Will Transfer to Toilet: with modified independence;ambulating;regular height toilet  OT Frequency: Min 2X/week    Co-evaluation              AM-PAC OT "6 Clicks" Daily Activity     Outcome Measure Help from another person eating meals?: None Help from another person taking care of personal grooming?: A Little Help from another person toileting, which includes using toliet, bedpan, or  urinal?: A Little Help from another person bathing (including washing, rinsing, drying)?: A Lot Help from another person to put on and taking off regular upper body clothing?: A Lot Help from another person to put on and taking off regular lower body clothing?: A Lot 6 Click Score: 16   End of Session Equipment Utilized During Treatment: Rolling walker (2 wheels);Oxygen Nurse Communication: Mobility status  Activity Tolerance: Patient tolerated treatment well Patient left: in bed;with call bell/phone within reach  OT Visit Diagnosis: Muscle weakness (generalized) (M62.81)                Time: 3419-3790 OT Time Calculation (min): 16 min Charges:  OT General Charges $OT Visit: 1 Visit OT Evaluation $OT Eval Moderate Complexity: 1 Mod  08/29/2022  RP, OTR/L  Acute Rehabilitation Services  Office:  [phone]   Lisa [name] 08/29/2022, 2:01 PM

## 2022-08-29 NOTE — TOC Initial Note (Addendum)
Transition of Care [facility]) - Initial/Assessment Note    Patient Details  Name: Lisa [name], Dr. MRN: [id number] Date of Birth: [birth year]  Transition of [facility]) CM/SW Contact:    Delilah Shan, LCSWA Phone Number: 08/29/2022, 1:40 PM  Clinical Narrative:                  CSW received consult for possible SNF placement at time of discharge. CSW spoke with patient and patients daughter Carollee Herter at bedside regarding PT recommendation of SNF placement at time of discharge. Patient reports she comes from [facility]. Patient expressed understanding of PT recommendation and is agreeable to SNF placement at [facility] when medically ready for dc.CSW discussed insurance authorization process with patient. Patient reports she has received Both Covid Vaccines and 3 boosters.Patient expressed being hopeful for rehab and to feel better soon. CSW spoke with Sue Lush with [facility] who confirmed SNF bed for patient.No further questions reported at this time. CSW to continue to follow and assist with discharge planning needs.   Expected Discharge Plan: Skilled Nursing Facility Barriers to Discharge: Continued Medical Work up   Patient Goals and CMS Choice Patient states their goals for this hospitalization and ongoing recovery are:: SNF CMS Medicare.gov Compare Post Acute Care list provided to:: Patient Choice offered to / list presented to : Patient  Expected Discharge Plan and Services Expected Discharge Plan: Skilled Nursing Facility       Living arrangements for the past 2 months: Independent Living Facility ([facility])                                      Prior Living Arrangements/Services Living arrangements for the past 2 months: Independent Living Facility ([facility]) Lives with:: Self Patient language and need for interpreter reviewed:: Yes Do you feel safe going back to the place where you live?: No   SNF  Need for  Family Participation in Patient Care: Yes (Comment) Care giver support system in place?: Yes (comment)   Criminal Activity/Legal Involvement Pertinent to Current Situation/Hospitalization: No - Comment as needed  Activities of Daily Living Home Assistive Devices/Equipment: None ADL Screening (condition at time of admission) Patient's cognitive ability adequate to safely complete daily activities?: Yes Is the patient deaf or have difficulty hearing?: Yes Does the patient have difficulty seeing, even when wearing glasses/contacts?: Yes Does the patient have difficulty concentrating, remembering, or making decisions?: Yes Patient able to express need for assistance with ADLs?: Yes Does the patient have difficulty dressing or bathing?: No Independently performs ADLs?: Yes (appropriate for developmental age) Does the patient have difficulty walking or climbing stairs?: No Weakness of Legs: None Weakness of Arms/Hands: None  Permission Sought/Granted Permission sought to share information with : Case Manager, Family Supports, Oceanographer granted to share information with : Yes, Verbal Permission Granted  Share Information with NAME: Carollee Herter  Permission granted to share info w AGENCY: SNF  Permission granted to share info w Relationship: Daughter  Permission granted to share info w Contact Information: Carollee Herter [phone]  Emotional Assessment Appearance:: Appears stated age Attitude/Demeanor/Rapport: Gracious Affect (typically observed): Calm Orientation: : Oriented to Self, Oriented to Place, Oriented to  Time, Oriented to Situation Alcohol / Substance Use: Not Applicable Psych Involvement: No (comment)  Admission diagnosis:  Unstable angina (HCC) [I20.0] 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] Patient Active Problem List  Diagnosis Date Noted   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   Unstable angina (HCC) 08/20/2022   Acute CVA (cerebrovascular accident) (HCC) 11/06/2020    Chronic kidney disease    Hypertension    Hyperlipemia    Ingrown toenail 09/26/2018   Acute non-recurrent maxillary sinusitis 01/05/2018   Spinal stenosis of lumbar region 09/20/2013   Pure hypercholesterolemia 09/20/2013   Allergic rhinitis, cause unspecified 09/20/2013   Encounter for long-term (current) use of other medications 09/20/2013   PCP:  Thana Ates, MD Pharmacy:   [id number] - [place], [place] - [address] AT [facility] [address] [place] [address] Phone: [phone] Fax: [phone]     Social Determinants of Health (SDOH) Interventions    Readmission Risk Interventions     No data to display

## 2022-08-29 NOTE — Progress Notes (Signed)
      [address]       [place] [address]             [phone]      POD # 3 CABG  Just back from a walk  BP 137/69   Pulse 78   Temp 98.5 F (36.9 C) (Oral)   Resp 16   Ht 5\' 3"  (1.6 m)   Wt 80.7 kg   SpO2 94%   BMI 31.52 kg/m  RA 92% sat  Intake/Output Summary (Last 24 hours) at 08/29/2022 1815 Last data filed at 08/29/2022 1151 Gross per 24 hour  Intake 260 ml  Output 2150 ml  Net -1890 ml   CBG well controlled Creatinine 1.52 this AM, down slightly  Awaiting bed on 4E  Caelan Branden C. 10/29/2022, MD [facility] [phone]

## 2022-08-29 NOTE — Evaluation (Signed)
Physical Therapy Evaluation Patient Details Name: Lisa [name], Dr. MRN: [id number] DOB: 08/14/1941 Today's Date: 08/29/2022  History of Present Illness  81 yo female admitted 8/30 with CAD. Pt s/p Cath 8/30 and CABG x 3 9/5. PMhx: CVA, HLD, HTN, GERD  Clinical Impression  Pt very pleasant and reports she has had progressive decline since Dec due to respiratory illness and now realizing she had CAD impacting that as well. Pt lives alone with her dog and [facility] and intends on rehab prior to home as no caregivers available. Pt with decreased strength, transfers, function and mobility who will benefit from acute therapy to maximize mobility, safety and function to decrease burden of care.   HR 85-86 SpO2 92% on RA on arrival with drop to 85% in standing, 1L during gait with sats inconsistent 85-93% BP 142/67 pre gait 141/67 post gait       Recommendations for follow up therapy are one component of a multi-disciplinary discharge planning process, led by the attending physician.  Recommendations may be updated based on patient status, additional functional criteria and insurance authorization.  Follow Up Recommendations Skilled nursing-short term rehab (<3 hours/day) Can patient physically be transported by private vehicle: Yes    Assistance Recommended at Discharge Intermittent Supervision/Assistance  Patient can return home with the following  A little help with walking and/or transfers;A little help with bathing/dressing/bathroom;Assistance with cooking/housework    Equipment Recommendations Rolling walker (2 wheels)  Recommendations for Other Services       Functional Status Assessment Patient has had a recent decline in their functional status and demonstrates the ability to make significant improvements in function in a reasonable and predictable amount of time.     Precautions / Restrictions Precautions Precautions: Sternal;Fall;Other (comment) Precaution Booklet Issued:  Yes (comment) Precaution Comments: watch sats Restrictions Weight Bearing Restrictions: Yes (sternal precautions)      Mobility  Bed Mobility Overal bed mobility: Needs Assistance Bed Mobility: Rolling, Sidelying to Sit, Sit to Sidelying Rolling: Min assist Sidelying to sit: Mod assist     Sit to sidelying: Min assist General bed mobility comments: physical assist to roll, rise from surface and lift legs back to bed. Pt denied OOB to chair end of session    Transfers Overall transfer level: Needs assistance   Transfers: Sit to/from Stand Sit to Stand: Min guard           General transfer comment: cues for hand placement and precautions    Ambulation/Gait Ambulation/Gait assistance: Min guard Gait Distance (Feet): 315 Feet Assistive device: Rolling walker (2 wheels) Gait Pattern/deviations: Step-through pattern, Decreased stride length   Gait velocity interpretation: 1.31 - 2.62 ft/sec, indicative of limited community ambulator   General Gait Details: cues for posture and breathing technique. pt with 1L O2 but without consistent pleth with numbers 85-93%  Stairs            Wheelchair Mobility    Modified Rankin (Stroke Patients Only)       Balance Overall balance assessment: Needs assistance   Sitting balance-Leahy Scale: Good     Standing balance support: Bilateral upper extremity supported Standing balance-Leahy Scale: Poor Standing balance comment: RW for standing and gait                             Pertinent Vitals/Pain Pain Assessment Pain Assessment: No/denies pain    Home Living Family/patient expects to be discharged to:: Private residence Living Arrangements: Alone  Type of Home: House Home Access: Level entry       Home Layout: One level Home Equipment: Cane - single point      Prior Function Prior Level of Function : Independent/Modified Independent                     Hand Dominance         Extremity/Trunk Assessment   Upper Extremity Assessment Upper Extremity Assessment: Generalized weakness    Lower Extremity Assessment Lower Extremity Assessment: Generalized weakness    Cervical / Trunk Assessment Cervical / Trunk Assessment: Kyphotic  Communication   Communication: No difficulties  Cognition Arousal/Alertness: Awake/alert Behavior During Therapy: WFL for tasks assessed/performed Overall Cognitive Status: Within Functional Limits for tasks assessed                                          General Comments      Exercises     Assessment/Plan    PT Assessment Patient needs continued PT services  PT Problem List Decreased strength;Decreased mobility;Decreased activity tolerance;Decreased balance;Decreased knowledge of use of DME       PT Treatment Interventions Gait training;Balance training;DME instruction;Therapeutic exercise;Functional mobility training;Therapeutic activities;Patient/family education    PT Goals (Current goals can be found in the Care Plan section)  Acute Rehab PT Goals Patient Stated Goal: go to rehab before home alone PT Goal Formulation: With patient Time For Goal Achievement: 09/12/22 Potential to Achieve Goals: Good    Frequency Min 3X/week     Co-evaluation               AM-PAC PT "6 Clicks" Mobility  Outcome Measure Help needed turning from your back to your side while in a flat bed without using bedrails?: A Little Help needed moving from lying on your back to sitting on the side of a flat bed without using bedrails?: A Lot Help needed moving to and from a bed to a chair (including a wheelchair)?: A Little Help needed standing up from a chair using your arms (e.g., wheelchair or bedside chair)?: A Little Help needed to walk in hospital room?: A Little Help needed climbing 3-5 steps with a railing? : Total 6 Click Score: 15    End of Session   Activity Tolerance: Patient tolerated treatment  well Patient left: in bed;with call bell/phone within reach Nurse Communication: Mobility status PT Visit Diagnosis: Other abnormalities of gait and mobility (R26.89);Difficulty in walking, not elsewhere classified (R26.2);Muscle weakness (generalized) (M62.81)    Time: 1019-1040 PT Time Calculation (min) (ACUTE ONLY): 21 min   Charges:   PT Evaluation $PT Eval Moderate Complexity: 1 Mod          Kingsley Farace P, PT Acute Rehabilitation Services Office: [phone]   Enedina Finner Vieva Brummitt 08/29/2022, 11:45 AM

## 2022-08-29 NOTE — NC FL2 (Signed)
[place] LEVEL OF CARE SCREENING TOOL     IDENTIFICATION  Patient Name: Lisa [name], Dr. Curt Jews: Jul 04, 1941 Sex: female Admission Date (Current Location): 08/20/2022  [facility] and [place] Number:  Herbalist and Address:  [facility] 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   Unstable angina ([place]) 08/20/2022   Acute CVA (cerebrovascular accident) ([place]) 11/06/2020   Chronic kidney disease    Hypertension    Hyperlipemia    Ingrown toenail 09/26/2018   Acute non-recurrent maxillary sinusitis 01/05/2018   Spinal stenosis of lumbar region 09/20/2013   Pure hypercholesterolemia 09/20/2013   Allergic rhinitis, cause unspecified 09/20/2013   Encounter for long-term (current) use of other medications 09/20/2013    Orientation RESPIRATION BLADDER Height & Weight     Self, Time, Situation, Place  O2 (Nasal Cannula 3 liters) Continent Weight: 177 lb 14.6 oz (80.7 kg) Height:  5\' 3"  (160 cm)  BEHAVIORAL SYMPTOMS/MOOD NEUROLOGICAL BOWEL NUTRITION STATUS      Continent Diet (Please see discharge summary)  AMBULATORY STATUS COMMUNICATION OF NEEDS Skin   Limited Assist Verbally Other (Comment) (Appropriate for ethnicity,dry,intact,non-tenting,wound incision (LDAs),Incision closed,leg,R,gauze,clean,dry,intact,daily,dressing in place,Incision closed,chest,other,silver,hydrofiber,clean,dry,intact,POD3,dressing in place)                       Personal Care Assistance Level of  Assistance  Bathing, Feeding, Dressing Bathing Assistance: Limited assistance Feeding assistance: Independent (able to feed self) Dressing Assistance: Limited assistance     Functional Limitations Info  Sight, Hearing, Speech Sight Info: Impaired Hearing Info: Adequate Speech Info: Adequate    SPECIAL CARE FACTORS FREQUENCY  PT (By licensed PT), OT (By licensed OT)     PT Frequency: 5x min weekly OT Frequency: 5x min weekly            Contractures Contractures Info: Not present    Additional Factors Info  Code Status, Allergies, Insulin Sliding Scale Code Status Info: FULL Allergies Info: Other,Anesthesia causes nausea and vomiting, memory difficulty and problems processing,Oxycodone,Sulfa Antibiotics,Sulfasalazine   Insulin Sliding Scale Info: insulin aspart (novoLOG) injection 0-24 Units 3 times daily before meals,insulin detemir (LEVEMIR) injection 6 Units 2 times daily       Current Medications (08/29/2022):  This is the current hospital active medication list Current Facility-Administered Medications  Medication Dose Route Frequency Provider Last Rate Last Admin   0.9 %  sodium chloride infusion  250 mL Intravenous Continuous Roddenberry, Myron G, PA-C   Stopped at 08/27/22 2007   acetaminophen (TYLENOL) tablet 1,000 mg  1,000 mg Oral Q6H Roddenberry, Myron G, PA-C   1,000 mg at 08/29/22 1201   Or   acetaminophen (TYLENOL) 160 MG/5ML solution 1,000 mg  1,000 mg Per Tube Q6H Roddenberry, Myron G, PA-C       aspirin EC tablet 325 mg  325 mg Oral Daily Antony Odea, PA-C   325 mg at 08/29/22 [id number]   Or   aspirin chewable tablet 324 mg  324 mg Per Tube Daily Roddenberry,  Cecille Amsterdam, PA-C       atorvastatin (LIPITOR) tablet 80 mg  80 mg Oral QHS Leary Roca, PA-C   80 mg at 08/28/22 2251   bisacodyl (DULCOLAX) EC tablet 10 mg  10 mg Oral Daily Leary Roca, PA-C   10 mg at 08/29/22 9242   Or   bisacodyl (DULCOLAX) suppository 10 mg  10 mg Rectal Daily  Roddenberry, Myron G, PA-C       Chlorhexidine Gluconate Cloth 2 % PADS 6 each  6 each Topical Daily Lovett Sox, MD   6 each at 08/29/22 1005   docusate sodium (COLACE) capsule 200 mg  200 mg Oral Daily Roddenberry, Myron G, PA-C   200 mg at 08/29/22 0918   enoxaparin (LOVENOX) injection 30 mg  30 mg Subcutaneous Q24H Lovett Sox, MD   30 mg at 08/28/22 2048   fentaNYL (SUBLIMAZE) injection 50 mcg  50 mcg Intravenous Q4H PRN Lovett Sox, MD       furosemide (LASIX) tablet 40 mg  40 mg Oral Daily Lovett Sox, MD   40 mg at 08/29/22 0917   gabapentin (NEURONTIN) capsule 200 mg  200 mg Oral BID Lovett Sox, MD   200 mg at 08/29/22 0917   insulin aspart (novoLOG) injection 0-24 Units  0-24 Units Subcutaneous TID Ardelle Lesches, MD   2 Units at 08/28/22 1127   insulin detemir (LEVEMIR) injection 6 Units  6 Units Subcutaneous BID Lovett Sox, MD   6 Units at 08/29/22 0919   lidocaine (LIDODERM) 5 % 2 patch  2 patch Transdermal Q24H Lovett Sox, MD   2 patch at 08/29/22 0919   metoCLOPramide (REGLAN) injection 5 mg  5 mg Intravenous Q6H Lovett Sox, MD   5 mg at 08/29/22 1236   metoprolol tartrate (LOPRESSOR) tablet 12.5 mg  12.5 mg Oral BID Roddenberry, Myron G, PA-C   12.5 mg at 08/29/22 6834   Or   metoprolol tartrate (LOPRESSOR) 25 mg/10 mL oral suspension 12.5 mg  12.5 mg Per Tube BID Roddenberry, Myron G, PA-C       metoprolol tartrate (LOPRESSOR) injection 2.5-5 mg  2.5-5 mg Intravenous Q2H PRN Roddenberry, Myron G, PA-C   5 mg at 08/27/22 0556   ondansetron (ZOFRAN) injection 4 mg  4 mg Intravenous Q6H PRN Mosetta Anis, [facility]       Oral care mouth rinse  15 mL Mouth Rinse PRN Lovett Sox, MD       pantoprazole (PROTONIX) EC tablet 40 mg  40 mg Oral Daily Roddenberry, Myron G, PA-C   40 mg at 08/29/22 1962   phenylephrine (NEO-SYNEPHRINE) 20mg /NS premix infusion  0-400 mcg/min Intravenous Titrated Roddenberry, Myron G, PA-C   Stopped at 08/26/22  1503   promethazine (PHENERGAN) 6.25 mg in sodium chloride 0.9 % 50 mL IVPB  6.25 mg Intravenous Q6H PRN 10/26/22, MD   Stopped at 08/27/22 0934   simethicone (MYLICON) chewable tablet 80 mg  80 mg Oral QID 10/27/22, MD   80 mg at 08/29/22 0919   sodium chloride flush (NS) 0.9 % injection 10-40 mL  10-40 mL Intracatheter Q12H 06-13-1997, MD   10 mL at 08/29/22 0921   sodium chloride flush (NS) 0.9 % injection 10-40 mL  10-40 mL Intracatheter PRN 06-13-1997, MD       sodium chloride flush (NS) 0.9 % injection 3 mL  3 mL Intravenous Q12H Roddenberry, Lovett Sox, PA-C   3 mL at 08/28/22 [phone]  sodium chloride flush (NS) 0.9 % injection 3 mL  3 mL Intravenous PRN Roddenberry, Myron G, PA-C       traMADol (ULTRAM) tablet 50 mg  50 mg Oral Q4H PRN Roddenberry, Myron G, PA-C   50 mg at 08/28/22 1275     Discharge Medications: Please see discharge summary for a list of discharge medications.  Relevant Imaging Results:  Relevant Lab Results:   Additional Information [id number], Both Covid Vaccines,3 boosters  Delilah Shan, [address]

## 2022-08-29 NOTE — Progress Notes (Signed)
3 Days Post-Op Procedure(s) (LRB): CORONARY ARTERY BYPASS GRAFTING (CABG) X THREE, USING LEFT INTERNAL MAMMARY ARTERY AND RIGHT LEG GREATER SAPHENOUS VEIN HARVESTED ENDOSCOPICALLY (N/A) TRANSESOPHAGEAL ECHOCARDIOGRAM (TEE) (N/A) Subjective: Excellent diuresis yesterday feels better overall sinus rhythm Stable renal function Ready to transfer to stepdown  Objective: Vital signs in last 24 hours: Temp:  [97.9 F (36.6 C)-99.3 F (37.4 C)] 99.3 F (37.4 C) (09/08 1958) Pulse Rate:  [68-94] 86 (09/08 1800) Cardiac Rhythm: Normal sinus rhythm (09/08 0800) Resp:  [7-21] 7 (09/08 1800) BP: (98-148)/(49-95) 124/59 (09/08 1800) SpO2:  [88 %-98 %] 92 % (09/08 1800) Weight:  [80.7 kg] 80.7 kg (09/08 0646)  Hemodynamic parameters for last 24 hours:    Intake/Output from previous day: 09/07 0701 - 09/08 0700 In: 797.1 [P.O.:720; IV Piggyback:77.1] Out: 3100 [Urine:3100] Intake/Output this shift: No intake/output data recorded.       Exam    General- alert and comfortable.  Sternal incision well-healed    Neck- no JVD, no cervical adenopathy palpable, no carotid bruit   Lungs- clear without rales, wheezes   Cor- regular rate and rhythm, no murmur , gallop   Abdomen- soft, non-tender   Extremities - warm, non-tender, minimal edema   Neuro- oriented, appropriate, no focal weakness   Lab Results: Recent Labs    08/28/22 0500 08/29/22 0620  WBC 11.5* 8.1  HGB 9.4* 9.4*  HCT 28.1* 28.3*  PLT 150 154   BMET:  Recent Labs    08/28/22 0500 08/29/22 0620  NA 137 139  K 3.9 3.8  CL 106 104  CO2 22 25  GLUCOSE 114* 105*  BUN 31* 33*  CREATININE 1.56* 1.52*  CALCIUM 7.8* 8.4*    PT/INR: No results for input(s): "LABPROT", "INR" in the last 72 hours. ABG    Component Value Date/Time   PHART 7.386 08/26/2022 2202   HCO3 22.7 08/26/2022 2202   TCO2 24 08/26/2022 2202   ACIDBASEDEF 2.0 08/26/2022 2202   O2SAT 65.6 08/27/2022 0407   CBG (last 3)  Recent Labs     08/29/22 0628 08/29/22 1127 08/29/22 1633  GLUCAP 97 104* 115*    Assessment/Plan: S/P Procedure(s) (LRB): CORONARY ARTERY BYPASS GRAFTING (CABG) X THREE, USING LEFT INTERNAL MAMMARY ARTERY AND RIGHT LEG GREATER SAPHENOUS VEIN HARVESTED ENDOSCOPICALLY (N/A) TRANSESOPHAGEAL ECHOCARDIOGRAM (TEE) (N/A) Mobilize Diuresis Plan for transfer to step-down: see transfer orders   LOS: 9 days    Lovett Sox 08/29/2022

## 2022-08-29 NOTE — Progress Notes (Signed)
Cardiologist:  Agbor-Etang  Subjective:  Sitting in chair eating Daughter at side   Objective:  Vitals:   08/29/22 0900 08/29/22 1000 08/29/22 1100 08/29/22 1200  BP: (!) 120/54 (!) 142/67 127/68   Pulse: 79 87 74   Resp: 14 20 14    Temp:    97.9 F (36.6 C)  TempSrc:    Oral  SpO2: 91% 92% 96%   Weight:      Height:        Intake/Output from previous day:  Intake/Output Summary (Last 24 hours) at 08/29/2022 1308 Last data filed at 08/29/2022 1151 Gross per 24 hour  Intake 381 ml  Output 2500 ml  Net -2119 ml    Physical Exam: Obese female Lungs clear Post sternotomy Right IJ/ CT out Right SVG harvest   Lab Results: Basic Metabolic Panel: Recent Labs    08/27/22 0407 08/27/22 1650 08/28/22 0500 08/29/22 0620  NA 140 137 137 139  K 4.2 4.2 3.9 3.8  CL 110 106 106 104  CO2 20* 21* 22 25  GLUCOSE 143* 154* 114* 105*  BUN 21 27* 31* 33*  CREATININE 1.14* 1.60* 1.56* 1.52*  CALCIUM 7.9* 7.9* 7.8* 8.4*  MG 2.7* 2.7*  --   --    Liver Function Tests: No results for input(s): "AST", "ALT", "ALKPHOS", "BILITOT", "PROT", "ALBUMIN" in the last 72 hours. No results for input(s): "LIPASE", "AMYLASE" in the last 72 hours. CBC: Recent Labs    08/28/22 0500 08/29/22 0620  WBC 11.5* 8.1  HGB 9.4* 9.4*  HCT 28.1* 28.3*  MCV 92.1 91.0  PLT 150 154     Imaging: DG Chest Port 1 View  Result Date: 08/29/2022 CLINICAL DATA:  Sore chest status post CABG EXAM: PORTABLE CHEST 1 VIEW COMPARISON:  Radiograph August 28, 2022. FINDINGS: Surgical changes of median sternotomy and CABG are present and appears similar. Interval removal of the mediastinal drain, left chest tube and right IJ vascular sheath. Stable positioning of the right upper extremity PICC. Unchanged cardiac enlargement.  Coronary artery calcifications. Similar streaky opacity in the left lung base favored atelectasis. No overt pulmonary edema. No visible pleural effusion or pneumothorax. No acute osseous  abnormality. IMPRESSION: Similar postsurgical change and cardiac enlargement of recent median sternotomy and CABG. Similar streaky opacities in the left lung base favor atelectasis. No overt pulmonary edema. Electronically Signed   By: August 30, 2022 M.D.   On: 08/29/2022 08:13   DG Chest Port 1 View  Result Date: 08/28/2022 CLINICAL DATA:  Status post coronary artery bypass surgery EXAM: PORTABLE CHEST 1 VIEW COMPARISON:  Previous studies including the examination of 08/27/2022 FINDINGS: Transverse diameter of heart is increased. Recent coronary artery bypass surgery. There is poor inspiration. Small linear densities are seen in lower lung fields. There are no signs of alveolar pulmonary edema or new focal infiltrates. There is interval removal of Swan-Ganz catheter. There is a right IJ venous sheath with its tip in superior vena cava. Tip of right PICC line is seen in the course of superior vena cava. Mediastinal drain and left chest tube have not changed. There is no significant pleural effusion. There is no pneumothorax. IMPRESSION: Small linear densities in lower lung fields suggest subsegmental atelectasis. There are no signs of alveolar pulmonary edema or new focal infiltrates. Other findings as described in the body of the report. Electronically Signed   By: 10/27/2022 M.D.   On: 08/28/2022 08:23    Cardiac Studies:  ECG: SR non specific  ST changes    Telemetry:  NSR   Echo: Intra op TEE EF 50%   Medications:    acetaminophen  1,000 mg Oral Q6H   Or   acetaminophen (TYLENOL) oral liquid 160 mg/5 mL  1,000 mg Per Tube Q6H   aspirin EC  325 mg Oral Daily   Or   aspirin  324 mg Per Tube Daily   atorvastatin  80 mg Oral QHS   bisacodyl  10 mg Oral Daily   Or   bisacodyl  10 mg Rectal Daily   Chlorhexidine Gluconate Cloth  6 each Topical Daily   docusate sodium  200 mg Oral Daily   enoxaparin (LOVENOX) injection  30 mg Subcutaneous Q24H   furosemide  40 mg Oral Daily    gabapentin  200 mg Oral BID   insulin aspart  0-24 Units Subcutaneous TID AC   insulin detemir  6 Units Subcutaneous BID   lidocaine  2 patch Transdermal Q24H   metoCLOPramide (REGLAN) injection  5 mg Intravenous Q6H   metoprolol tartrate  12.5 mg Oral BID   Or   metoprolol tartrate  12.5 mg Per Tube BID   pantoprazole  40 mg Oral Daily   simethicone  80 mg Oral QID   sodium chloride flush  10-40 mL Intracatheter Q12H   sodium chloride flush  3 mL Intravenous Q12H      sodium chloride Stopped (08/27/22 2007)   phenylephrine (NEO-SYNEPHRINE) Adult infusion Stopped (08/26/22 1503)   promethazine (PHENERGAN) injection (IM or IVPB) Stopped (08/27/22 0934)    Assessment/Plan:   Day 3 post bypass lines / CT out Rhythm stable Drips off Hct 28.3 EF 50% LIMA to LAD, SVG to OM2 and SVG to RCA. Transfer to floor Doing well   Charlton Haws 08/29/2022, 1:08 PM

## 2022-08-30 LAB — CBC
HCT: 26.3 % — ABNORMAL LOW (ref 36.0–46.0)
Hemoglobin: 8.7 g/dL — ABNORMAL LOW (ref 12.0–15.0)
MCH: 30 pg (ref 26.0–34.0)
MCHC: 33.1 g/dL (ref 30.0–36.0)
MCV: 90.7 fL (ref 80.0–100.0)
Platelets: 164 10*3/uL (ref 150–400)
RBC: 2.9 MIL/uL — ABNORMAL LOW (ref 3.87–5.11)
RDW: 13.4 % (ref 11.5–15.5)
WBC: 5.9 10*3/uL (ref 4.0–10.5)
nRBC: 0 % (ref 0.0–0.2)

## 2022-08-30 LAB — BASIC METABOLIC PANEL
Anion gap: 9 (ref 5–15)
BUN: 34 mg/dL — ABNORMAL HIGH (ref 8–23)
CO2: 26 mmol/L (ref 22–32)
Calcium: 8.1 mg/dL — ABNORMAL LOW (ref 8.9–10.3)
Chloride: 103 mmol/L (ref 98–111)
Creatinine, Ser: 1.39 mg/dL — ABNORMAL HIGH (ref 0.44–1.00)
GFR, Estimated: 38 mL/min — ABNORMAL LOW (ref >60)
Glucose, Bld: 105 mg/dL — ABNORMAL HIGH (ref 70–99)
Potassium: 3.2 mmol/L — ABNORMAL LOW (ref 3.5–5.1)
Sodium: 138 mmol/L (ref 135–145)

## 2022-08-30 LAB — GLUCOSE, CAPILLARY
Glucose-Capillary: 89 mg/dL (ref 70–99)
Glucose-Capillary: 116 mg/dL — ABNORMAL HIGH (ref 70–99)
Glucose-Capillary: 132 mg/dL — ABNORMAL HIGH (ref 70–99)

## 2022-08-30 LAB — TSH: TSH: 3.164 u[IU]/mL (ref 0.350–4.500)

## 2022-08-30 MED ORDER — AMIODARONE HCL 200 MG PO TABS
400.0000 mg | ORAL_TABLET | Freq: Two times a day (BID) | ORAL | Status: DC
  Administered 2022-08-30 – 2022-09-01 (×4): 400 mg via ORAL
  Filled 2022-08-30 (×4): qty 2

## 2022-08-30 MED ORDER — POTASSIUM CHLORIDE CRYS ER 20 MEQ PO TBCR
40.0000 meq | EXTENDED_RELEASE_TABLET | Freq: Two times a day (BID) | ORAL | Status: AC
Start: 2022-08-30 — End: 2022-08-30
  Administered 2022-08-30 (×2): 40 meq via ORAL
  Filled 2022-08-30 (×2): qty 2

## 2022-08-30 MED ORDER — AMIODARONE HCL 200 MG PO TABS: 400.0000 mg | ORAL_TABLET | Freq: Every day | ORAL | Status: DC

## 2022-08-30 NOTE — Progress Notes (Signed)
  Amiodarone Drug - Drug Interaction Consult Note  Recommendations: Recommend monitoring for muscle pain with atorvastatin.  Monitor HR on metoprolol tartrate.  Monitor electrolytes on furosemide. Recommend maintaining K>4.  Monitor QT interval with Q6h Reglan and Phenergan. Consider scheduling Compazine instead of Reglan.  Amiodarone is metabolized by the cytochrome P450 system and therefore has the potential to cause many drug interactions. Amiodarone has an average plasma half-life of 50 days (range 20 to 100 days).   There is potential for drug interactions to occur several weeks or months after stopping treatment and the onset of drug interactions may be slow after initiating amiodarone.   [x]  Statins: Increased risk of myopathy. Simvastatin- restrict dose to 20mg  daily. Other statins: counsel patients to report any muscle pain or weakness immediately.  []  Anticoagulants: Amiodarone can increase anticoagulant effect. Consider warfarin dose reduction. Patients should be monitored closely and the dose of anticoagulant altered accordingly, remembering that amiodarone levels take several weeks to stabilize.  []  Antiepileptics: Amiodarone can increase plasma concentration of phenytoin, the dose should be reduced. Note that small changes in phenytoin dose can result in large changes in levels. Monitor patient and counsel on signs of toxicity.  [x]  Beta blockers: increased risk of bradycardia, AV block and myocardial depression. Sotalol - avoid concomitant use.  []   Calcium channel blockers (diltiazem and verapamil): increased risk of bradycardia, AV block and myocardial depression.  []   Cyclosporine: Amiodarone increases levels of cyclosporine. Reduced dose of cyclosporine is recommended.  []  Digoxin dose should be halved when amiodarone is started.  [x]  Diuretics: increased risk of cardiotoxicity if hypokalemia occurs.  []  Oral hypoglycemic agents (glyburide, glipizide, glimepiride):  increased risk of hypoglycemia. Patient's glucose levels should be monitored closely when initiating amiodarone therapy.   [x]  Drugs that prolong the QT interval:  Torsades de pointes risk may be increased with concurrent use - avoid if possible.  Monitor QTc, also keep magnesium/potassium WNL if concurrent therapy can't be avoided.  Antibiotics: e.g. fluoroquinolones, erythromycin.  Antiarrhythmics: e.g. quinidine, procainamide, disopyramide, sotalol.  Antipsychotics: e.g. phenothiazines, haloperidol.   Lithium, tricyclic antidepressants, and methadone. Thank You,  , PharmD PGY1 Pharmacy Resident   08/30/2022  10:59 AM

## 2022-08-30 NOTE — Progress Notes (Signed)
Patient received via w.c. alert and orin. Orin. To room R.N. into do assessment No complaints voiced.

## 2022-08-30 NOTE — Progress Notes (Signed)
CARDIAC REHAB PHASE I   PRE:  Rate/Rhythm: 81 SR    BP: sitting 117/64    SaO2: 92 RA  MODE:  Ambulation: 270 ft   POST:  Rate/Rhythm: 96 SR    BP: sitting 124/61     SaO2: 92 RA  Pt able to move out of bed with min assist and walk with RW. Slow and steady gait. Tolerated well, fatigue with distance. Return to bed for a nap. Encouraged x3 walks a day, recliner, and IS. Pt performing 700 ml.  [phone]   Ethelda Chick BS, ACSM-CEP 08/30/2022 1:18 PM

## 2022-08-30 NOTE — Progress Notes (Addendum)
[address]       [place] [address]             [phone]      4 Days Post-Op Procedure(s) (LRB): CORONARY ARTERY BYPASS GRAFTING (CABG) X THREE, USING LEFT INTERNAL MAMMARY ARTERY AND RIGHT LEG GREATER SAPHENOUS VEIN HARVESTED ENDOSCOPICALLY (N/A) TRANSESOPHAGEAL ECHOCARDIOGRAM (TEE) (N/A) Subjective: Some soreness, sternal but tolerable on current RX  Objective: Vital signs in last 24 hours: Temp:  [97.9 F (36.6 C)-99.3 F (37.4 C)] 97.9 F (36.6 C) (09/09 0738) Pulse Rate:  [73-95] 77 (09/09 0738) Cardiac Rhythm: Normal sinus rhythm;Other (Comment) (09/08 2233) Resp:  [7-20] 16 (09/09 0738) BP: (101-148)/(49-76) 131/64 (09/09 0738) SpO2:  [91 %-98 %] 94 % (09/09 0738) Weight:  [80 kg-80.2 kg] 80 kg (09/09 0557)  Hemodynamic parameters for last 24 hours:    Intake/Output from previous day: 09/08 0701 - 09/09 0700 In: 500 [P.O.:480; I.V.:20] Out: 150 [Urine:150] Intake/Output this shift: No intake/output data recorded.  General appearance: alert, cooperative, and no distress Heart: regular rate and rhythm Lungs: mildly dim in left base Abdomen: benign exam Extremities: no edema Wound: incis healing well  Lab Results: Recent Labs    08/29/22 0620 08/30/22 0500  WBC 8.1 5.9  HGB 9.4* 8.7*  HCT 28.3* 26.3*  PLT 154 164   BMET:  Recent Labs    08/29/22 0620 08/30/22 0500  NA 139 138  K 3.8 3.2*  CL 104 103  CO2 25 26  GLUCOSE 105* 105*  BUN 33* 34*  CREATININE 1.52* 1.39*  CALCIUM 8.4* 8.1*    PT/INR: No results for input(s): "LABPROT", "INR" in the last 72 hours. ABG    Component Value Date/Time   PHART 7.386 08/26/2022 2202   HCO3 22.7 08/26/2022 2202   TCO2 24 08/26/2022 2202   ACIDBASEDEF 2.0 08/26/2022 2202   O2SAT 65.6 08/27/2022 0407   CBG (last 3)  Recent Labs    08/29/22 1127 08/29/22 1633 08/30/22 0602  GLUCAP 104* 115* 89    Meds Scheduled Meds:  acetaminophen  1,000 mg Oral Q6H   Or    acetaminophen (TYLENOL) oral liquid 160 mg/5 mL  1,000 mg Per Tube Q6H   aspirin EC  325 mg Oral Daily   Or   aspirin  324 mg Per Tube Daily   atorvastatin  80 mg Oral QHS   bisacodyl  10 mg Oral Daily   Or   bisacodyl  10 mg Rectal Daily   Chlorhexidine Gluconate Cloth  6 each Topical Daily   docusate sodium  200 mg Oral Daily   enoxaparin (LOVENOX) injection  30 mg Subcutaneous Q24H   furosemide  40 mg Oral Daily   gabapentin  200 mg Oral BID   insulin aspart  0-24 Units Subcutaneous TID AC   insulin detemir  6 Units Subcutaneous BID   lidocaine  2 patch Transdermal Q24H   metoCLOPramide (REGLAN) injection  5 mg Intravenous Q6H   metoprolol tartrate  12.5 mg Oral BID   Or   metoprolol tartrate  12.5 mg Per Tube BID   pantoprazole  40 mg Oral Daily   simethicone  80 mg Oral QID   sodium chloride flush  10-40 mL Intracatheter Q12H   sodium chloride flush  3 mL Intravenous Q12H   Continuous Infusions:  sodium chloride Stopped (08/27/22 2007)   phenylephrine (NEO-SYNEPHRINE) Adult infusion Stopped (08/26/22 1503)   promethazine (PHENERGAN) injection (IM or IVPB) Stopped (08/27/22 0934)  PRN Meds:.fentaNYL (SUBLIMAZE) injection, metoprolol tartrate, ondansetron (ZOFRAN) IV, mouth rinse, promethazine (PHENERGAN) injection (IM or IVPB), sodium chloride flush, sodium chloride flush, traMADol  Xrays DG Chest Port 1 View  Result Date: 08/29/2022 CLINICAL DATA:  Sore chest status post CABG EXAM: PORTABLE CHEST 1 VIEW COMPARISON:  Radiograph August 28, 2022. FINDINGS: Surgical changes of median sternotomy and CABG are present and appears similar. Interval removal of the mediastinal drain, left chest tube and right IJ vascular sheath. Stable positioning of the right upper extremity PICC. Unchanged cardiac enlargement.  Coronary artery calcifications. Similar streaky opacity in the left lung base favored atelectasis. No overt pulmonary edema. No visible pleural effusion or pneumothorax. No  acute osseous abnormality. IMPRESSION: Similar postsurgical change and cardiac enlargement of recent median sternotomy and CABG. Similar streaky opacities in the left lung base favor atelectasis. No overt pulmonary edema. Electronically Signed   By: Maudry Mayhew M.D.   On: 08/29/2022 08:13    Assessment/Plan: S/P Procedure(s) (LRB): CORONARY ARTERY BYPASS GRAFTING (CABG) X THREE, USING LEFT INTERNAL MAMMARY ARTERY AND RIGHT LEG GREATER SAPHENOUS VEIN HARVESTED ENDOSCOPICALLY (N/A) TRANSESOPHAGEAL ECHOCARDIOGRAM (TEE) (N/A) POD#4  1 afeb, VSS S BP 100's-140's, sinus rhythm, afib with rate in 120's earlier- will start on po amio 2 O2 sats good on RA 3 weight trending lower, UOP- not measured. Getting 40 of lasix daily- should be able to stop soon 4 K+ 3.2- will replace 5 creat improving trend from AKI 6 H/H- expected ABLA, slow trend lower- not at transfusion threshold 7 CBG good control- not on DM meds as outpatient, HG A1C is 6.3 8 cont pul hygiene and rehab- doing very well overall      LOS: 10 days    Rowe Clack PA-C Pager [phone] 08/30/2022  Patient seen and examined, agree with above She had atrial fibrillation earlier- now back in SR  [place] C. Dorris Fetch, MD [facility] [phone]

## 2022-08-31 LAB — BASIC METABOLIC PANEL
Anion gap: 9 (ref 5–15)
BUN: 38 mg/dL — ABNORMAL HIGH (ref 8–23)
CO2: 28 mmol/L (ref 22–32)
Calcium: 9 mg/dL (ref 8.9–10.3)
Chloride: 103 mmol/L (ref 98–111)
Creatinine, Ser: 1.4 mg/dL — ABNORMAL HIGH (ref 0.44–1.00)
GFR, Estimated: 38 mL/min — ABNORMAL LOW (ref >60)
Glucose, Bld: 105 mg/dL — ABNORMAL HIGH (ref 70–99)
Potassium: 4 mmol/L (ref 3.5–5.1)
Sodium: 140 mmol/L (ref 135–145)

## 2022-08-31 LAB — MAGNESIUM: Magnesium: 2.4 mg/dL (ref 1.7–2.4)

## 2022-08-31 LAB — GLUCOSE, CAPILLARY
Glucose-Capillary: 109 mg/dL — ABNORMAL HIGH (ref 70–99)
Glucose-Capillary: 97 mg/dL (ref 70–99)
Glucose-Capillary: 110 mg/dL — ABNORMAL HIGH (ref 70–99)
Glucose-Capillary: 88 mg/dL (ref 70–99)

## 2022-08-31 MED ORDER — DM-GUAIFENESIN ER 30-600 MG PO TB12
1.0000 | ORAL_TABLET | Freq: Two times a day (BID) | ORAL | Status: DC
  Administered 2022-08-31 – 2022-09-02 (×5): 1 via ORAL
  Filled 2022-08-31 (×5): qty 1

## 2022-08-31 NOTE — Progress Notes (Addendum)
[address]       [place] [address]             [phone]      5 Days Post-Op Procedure(s) (LRB): CORONARY ARTERY BYPASS GRAFTING (CABG) X THREE, USING LEFT INTERNAL MAMMARY ARTERY AND RIGHT LEG GREATER SAPHENOUS VEIN HARVESTED ENDOSCOPICALLY (N/A) TRANSESOPHAGEAL ECHOCARDIOGRAM (TEE) (N/A) Subjective: No further afib  Objective: Vital signs in last 24 hours: Temp:  [98.1 F (36.7 C)-98.5 F (36.9 C)] 98.4 F (36.9 C) (09/10 0756) Pulse Rate:  [69-89] 84 (09/10 0756) Cardiac Rhythm: Normal sinus rhythm (09/09 1943) Resp:  [16-20] 20 (09/10 0756) BP: (118-138)/(58-67) 138/64 (09/10 0756) SpO2:  [92 %-95 %] 92 % (09/10 0756) Weight:  [79.1 kg] 79.1 kg (09/10 0514)  Hemodynamic parameters for last 24 hours:    Intake/Output from previous day: 09/09 0701 - 09/10 0700 In: 360 [P.O.:360] Out: 100 [Urine:100] Intake/Output this shift: No intake/output data recorded.  General appearance: alert, cooperative, and no distress Heart: regular rate and rhythm Lungs: somewhat coarse throughout Abdomen: benign Extremities: no edema Wound: incis healing well  Lab Results: Recent Labs    08/29/22 0620 08/30/22 0500  WBC 8.1 5.9  HGB 9.4* 8.7*  HCT 28.3* 26.3*  PLT 154 164   BMET:  Recent Labs    08/30/22 0500 08/31/22 0510  NA 138 140  K 3.2* 4.0  CL 103 103  CO2 26 28  GLUCOSE 105* 105*  BUN 34* 38*  CREATININE 1.39* 1.40*  CALCIUM 8.1* 9.0    PT/INR: No results for input(s): "LABPROT", "INR" in the last 72 hours. ABG    Component Value Date/Time   PHART 7.386 08/26/2022 2202   HCO3 22.7 08/26/2022 2202   TCO2 24 08/26/2022 2202   ACIDBASEDEF 2.0 08/26/2022 2202   O2SAT 65.6 08/27/2022 0407   CBG (last 3)  Recent Labs    08/30/22 1149 08/30/22 2144 08/31/22 0622  GLUCAP 116* 132* 109*    Meds Scheduled Meds:  acetaminophen  1,000 mg Oral Q6H   Or   acetaminophen (TYLENOL) oral liquid 160 mg/5 mL  1,000 mg Per Tube Q6H    amiodarone  400 mg Oral Q12H   Followed by   [START ON 09/07/2022] amiodarone  400 mg Oral Daily   aspirin EC  325 mg Oral Daily   Or   aspirin  324 mg Per Tube Daily   atorvastatin  80 mg Oral QHS   bisacodyl  10 mg Oral Daily   Or   bisacodyl  10 mg Rectal Daily   Chlorhexidine Gluconate Cloth  6 each Topical Daily   docusate sodium  200 mg Oral Daily   enoxaparin (LOVENOX) injection  30 mg Subcutaneous Q24H   furosemide  40 mg Oral Daily   gabapentin  200 mg Oral BID   insulin aspart  0-24 Units Subcutaneous TID AC   insulin detemir  6 Units Subcutaneous BID   lidocaine  2 patch Transdermal Q24H   metoCLOPramide (REGLAN) injection  5 mg Intravenous Q6H   metoprolol tartrate  12.5 mg Oral BID   Or   metoprolol tartrate  12.5 mg Per Tube BID   pantoprazole  40 mg Oral Daily   simethicone  80 mg Oral QID   sodium chloride flush  10-40 mL Intracatheter Q12H   sodium chloride flush  3 mL Intravenous Q12H   Continuous Infusions:  sodium chloride Stopped (08/27/22 2007)   phenylephrine (NEO-SYNEPHRINE) Adult infusion Stopped (08/26/22  1503)   promethazine (PHENERGAN) injection (IM or IVPB) Stopped (08/27/22 0934)   PRN Meds:.fentaNYL (SUBLIMAZE) injection, metoprolol tartrate, ondansetron (ZOFRAN) IV, mouth rinse, promethazine (PHENERGAN) injection (IM or IVPB), sodium chloride flush, sodium chloride flush, traMADol  Xrays No results found.  Assessment/Plan: S/P Procedure(s) (LRB): CORONARY ARTERY BYPASS GRAFTING (CABG) X THREE, USING LEFT INTERNAL MAMMARY ARTERY AND RIGHT LEG GREATER SAPHENOUS VEIN HARVESTED ENDOSCOPICALLY (N/A) TRANSESOPHAGEAL ECHOCARDIOGRAM (TEE) (N/A) POD#5  1 afeb, VSS sinus rhythm 2 O2 sats ok on RA 3 voiding well, not measured, weight below preop- will stop diuretics 4 creat stable at 1.4 5 TSH in normal range 6 CBG well controlled,diet controlled , HGB A1c 6.3 preop 7 d/c epw's 8 work on [employer], will add mucinex, also routine rehab 9  poss home in am 10 lovenox for DVT ppx   LOS: 11 days    Rowe Clack 08/31/2022   Patient seen and examined, agree with above Maintaining SR EPW just removed Probably home tomorrow  Viviann Spare C. Dorris Fetch, MD [facility] [phone]

## 2022-08-31 NOTE — Progress Notes (Signed)
Pt places themself on home CPAP unit without any assistance.

## 2022-08-31 NOTE — Progress Notes (Signed)
Pacing wires removed at 9:45 Ends in tact Vitals set to sequence Pt tolerated well

## 2022-08-31 NOTE — Progress Notes (Signed)
Mobility Specialist Progress Note    08/31/22 1459  Mobility  Activity Contraindicated/medical hold   Pt c/o nausea. RN present. Will f/u as schedule permits.   Rowes [employer] [place] Mobility Specialist

## 2022-08-31 NOTE — Progress Notes (Signed)
Pt ambulated 463ft with daughter and front wheel walker Pt tolerated well

## 2022-09-01 LAB — BASIC METABOLIC PANEL
Anion gap: 9 (ref 5–15)
BUN: 38 mg/dL — ABNORMAL HIGH (ref 8–23)
CO2: 26 mmol/L (ref 22–32)
Calcium: 8.8 mg/dL — ABNORMAL LOW (ref 8.9–10.3)
Chloride: 104 mmol/L (ref 98–111)
Creatinine, Ser: 1.32 mg/dL — ABNORMAL HIGH (ref 0.44–1.00)
GFR, Estimated: 41 mL/min — ABNORMAL LOW (ref >60)
Glucose, Bld: 111 mg/dL — ABNORMAL HIGH (ref 70–99)
Potassium: 4.1 mmol/L (ref 3.5–5.1)
Sodium: 139 mmol/L (ref 135–145)

## 2022-09-01 LAB — GLUCOSE, CAPILLARY
Glucose-Capillary: 96 mg/dL (ref 70–99)
Glucose-Capillary: 131 mg/dL — ABNORMAL HIGH (ref 70–99)
Glucose-Capillary: 111 mg/dL — ABNORMAL HIGH (ref 70–99)
Glucose-Capillary: 150 mg/dL — ABNORMAL HIGH (ref 70–99)

## 2022-09-01 MED ORDER — ATORVASTATIN CALCIUM 80 MG PO TABS: 80.0000 mg | ORAL_TABLET | Freq: Every day | ORAL | Status: DC

## 2022-09-01 MED ORDER — LOPERAMIDE HCL 2 MG PO CAPS
4.0000 mg | ORAL_CAPSULE | Freq: Once | ORAL | Status: AC
  Administered 2022-09-01: 4 mg via ORAL
  Filled 2022-09-01: qty 2

## 2022-09-01 MED ORDER — LOPERAMIDE HCL 2 MG PO CAPS: 2.0000 mg | ORAL_CAPSULE | ORAL | Status: DC | PRN

## 2022-09-01 MED ORDER — FUROSEMIDE 40 MG PO TABS: 40.0000 mg | ORAL_TABLET | Freq: Every day | ORAL | 11 refills | Status: DC

## 2022-09-01 MED ORDER — AMIODARONE HCL 200 MG PO TABS
200.0000 mg | ORAL_TABLET | Freq: Every day | ORAL | Status: DC
  Administered 2022-09-02: 200 mg via ORAL
  Filled 2022-09-01: qty 1

## 2022-09-01 MED ORDER — METOPROLOL TARTRATE 25 MG PO TABS: 12.5000 mg | ORAL_TABLET | Freq: Two times a day (BID) | ORAL | Status: DC

## 2022-09-01 MED ORDER — ASPIRIN 325 MG PO TBEC: 325.0000 mg | DELAYED_RELEASE_TABLET | Freq: Every day | ORAL | Status: DC

## 2022-09-01 MED ORDER — AMIODARONE HCL 400 MG PO TABS: 400.0000 mg | ORAL_TABLET | Freq: Two times a day (BID) | ORAL | Status: DC

## 2022-09-01 MED ORDER — POTASSIUM CHLORIDE CRYS ER 10 MEQ PO TBCR: 20.0000 meq | EXTENDED_RELEASE_TABLET | Freq: Two times a day (BID) | ORAL | Status: DC

## 2022-09-01 MED ORDER — TRAMADOL HCL 50 MG PO TABS: 50.0000 mg | ORAL_TABLET | Freq: Four times a day (QID) | ORAL | 0 refills | Status: DC | PRN

## 2022-09-01 MED ORDER — ACETAMINOPHEN 500 MG PO TABS: 500.0000 mg | ORAL_TABLET | Freq: Four times a day (QID) | ORAL | 0 refills | Status: AC | PRN

## 2022-09-01 MED ORDER — GABAPENTIN 100 MG PO CAPS: 200.0000 mg | ORAL_CAPSULE | Freq: Two times a day (BID) | ORAL | Status: DC

## 2022-09-01 MED ORDER — ONDANSETRON HCL 4 MG PO TABS: 4.0000 mg | ORAL_TABLET | Freq: Three times a day (TID) | ORAL | 0 refills | Status: DC | PRN

## 2022-09-01 NOTE — Progress Notes (Signed)
CARDIAC REHAB PHASE I   PRE:  Rate/Rhythm: NSR/84    BP: sitting 142/62    SaO2: 99%RA  MODE:  Ambulation: 380 ft   POST:  Rate/Rhythm: 100/NSR    BP: sitting 147/55     SaO2: 99%RA Patient ambulated 380 ft with rolling walker, stand by assist, patient tolerated well, denies SOB, dizziness. Patient educated on IS, risk factors, sternal precautions, exercise guidelines, incision site care, restrictions, nutrition; cardiac rehab phase 2 discussed, and will refer to [facility].  [phone] Newell Coral RN 09/01/2022 9:57 AM

## 2022-09-01 NOTE — Care Management Important Message (Signed)
Important Message  Patient Details  Name: Lisa [name], Dr. MRN: [id number] Date of Birth: [birth year]   Medicare Important Message Given:  Yes     Renie Ora 09/01/2022, 9:45 AM

## 2022-09-01 NOTE — Progress Notes (Signed)
Occupational Therapy Treatment Patient Details Name: Lisa [name], Dr. MRN: [id number] DOB: 1941-07-10 Today's Date: 09/01/2022   History of present illness 81 yo female admitted 8/30 with CAD. Pt s/p Cath 8/30 and CABG x 3 9/5. PMhx: CVA, HLD, HTN, GERD   OT comments  Pt fatigued following earlier cardiac rehab and PT visits, still willing to work with OT. Educated in energy conservation strategies and IADLs she will need to avoid until sternal precautions are lifted. Recommended seated showering. Pt continues to struggle with bed mobility and generalizing sternal precautions. Improved ability to access feet for LB bathing and dressing. She continues to be appropriate for SNF level rehab as she limited social support.    Recommendations for follow up therapy are one component of a multi-disciplinary discharge planning process, led by the attending physician.  Recommendations may be updated based on patient status, additional functional criteria and insurance authorization.    Follow Up Recommendations  Skilled nursing-short term rehab (<3 hours/day)    Assistance Recommended at Discharge Frequent or constant Supervision/Assistance  Patient can return home with the following  A little help with walking and/or transfers;A little help with bathing/dressing/bathroom;Assistance with cooking/housework;Assist for transportation;Help with stairs or ramp for entrance   Equipment Recommendations  None recommended by OT    Recommendations for Other Services      Precautions / Restrictions Precautions Precautions: Sternal;Fall Precaution Booklet Issued: Yes (comment) Restrictions Weight Bearing Restrictions: No RUE Partial Weight Bearing Percentage or Pounds: sternal prec LUE Partial Weight Bearing Percentage or Pounds: sternal prec       Mobility Bed Mobility Overal bed mobility: Needs Assistance Bed Mobility: Rolling, Sidelying to Sit, Sit to Sidelying Rolling:  Supervision Sidelying to sit: HOB elevated, Min guard     Sit to sidelying: Mod assist General bed mobility comments: cues for technique, increased time and effort, assist for LEs back into bed    Transfers Overall transfer level: Needs assistance Equipment used: Rolling walker (2 wheels) Transfers: Sit to/from Stand Sit to Stand: Supervision           General transfer comment: pt self cued hand placement, use of momentum     Balance Overall balance assessment: Needs assistance   Sitting balance-Leahy Scale: Good     Standing balance support: Bilateral upper extremity supported Standing balance-Leahy Scale: Poor                             ADL either performed or assessed with clinical judgement   ADL Overall ADL's : Needs assistance/impaired   Eating/Feeding Details (indicate cue type and reason): poor appetite                 Lower Body Dressing: Minimal assistance;Sitting/lateral leans Lower Body Dressing Details (indicate cue type and reason): increased time, pt flexed LE up on bed to access feet Toilet Transfer: Min guard;Rolling walker (2 wheels);Ambulation           Functional mobility during ADLs: Min guard;Rolling walker (2 wheels) General ADL Comments: Pt with poor activity tolerance. Educated in IADLs to be avoided: walking her dog, putting out garbage, laundry, making bed. Instructed in energy conservation strategies.    Extremity/Trunk Assessment              Vision       Perception     Praxis      Cognition Arousal/Alertness: Awake/alert Behavior During Therapy: WFL for tasks assessed/performed Overall Cognitive Status: Within  Functional Limits for tasks assessed                                          Exercises      Shoulder Instructions       General Comments VSS on RA but was short of breath with activity requiring rest breaks; educated on precautions with activities    Pertinent  Vitals/ Pain       Pain Assessment Pain Assessment: Faces Faces Pain Scale: Hurts little more Pain Location: chest incision Pain Descriptors / Indicators: Sore Pain Intervention(s): Monitored during session, Premedicated before session  Home Living                                          Prior Functioning/Environment              Frequency  Min 2X/week        Progress Toward Goals  OT Goals(current goals can now be found in the care plan section)  Progress towards OT goals: Progressing toward goals  Acute Rehab OT Goals OT Goal Formulation: With patient Time For Goal Achievement: 09/12/22 Potential to Achieve Goals: Good  Plan Discharge plan remains appropriate    Co-evaluation                 AM-PAC OT "6 Clicks" Daily Activity     Outcome Measure   Help from another person eating meals?: None Help from another person taking care of personal grooming?: A Little Help from another person toileting, which includes using toliet, bedpan, or urinal?: A Little Help from another person bathing (including washing, rinsing, drying)?: A Lot Help from another person to put on and taking off regular upper body clothing?: A Little Help from another person to put on and taking off regular lower body clothing?: A Little 6 Click Score: 18    End of Session Equipment Utilized During Treatment: Rolling walker (2 wheels);Oxygen  OT Visit Diagnosis: Muscle weakness (generalized) (M62.81)   Activity Tolerance Patient limited by fatigue   Patient Left in bed;with call bell/phone within reach;with family/visitor present   Nurse Communication          Time: 8295-6213 OT Time Calculation (min): 23 min  Charges: OT General Charges $OT Visit: 1 Visit OT Treatments $Self Care/Home Management : 23-37 mins  Berna Spare, OTR/L Acute Rehabilitation Services Office: [phone]   [name] 09/01/2022, 12:19 PM

## 2022-09-01 NOTE — TOC Progression Note (Signed)
Transition of Care [facility]) - Progression Note    Patient Details  Name: Lisa [name], Dr. MRN: [id number] Date of Birth: 09/03/1941  Transition [facility] CM/SW Contact  Eduard Roux, [place] Phone Number: 09/01/2022, 2:58 PM  Clinical Narrative:     Insurance authorization started- referral # [id number]- insurance pending  TOC will continue to follow and assist with discharge planning.   Antony Blackbird, MSW, LCSW Clinical Social Worker    Expected Discharge Plan: Skilled Nursing Facility Barriers to Discharge: Insurance Authorization  Expected Discharge Plan and Services Expected Discharge Plan: Skilled Nursing Facility       Living arrangements for the past 2 months: Independent Living Facility [facility]) Expected Discharge Date: 09/01/22                                     Social Determinants of Health (SDOH) Interventions    Readmission Risk Interventions     No data to display

## 2022-09-01 NOTE — TOC Progression Note (Signed)
Transition of Care [facility]) - Progression Note    Patient Details  Name: Lisa [name], Dr. MRN: [id number] Date of Birth: 08/08/1941  Transition of Care [facility]) CM/SW Contact  Eduard Roux, [place] Phone Number: 09/01/2022, 10:14 AM  Clinical Narrative:     Patient needs insurance authorization before she can discharge to [facility]. CSW has informed PT/Darcia, will need updated PT note before insurance auth can be started.  Informed [place]- anticipate d/c today, pending insurance authorization  TOC will continue to follow and assist with discharge planning.   Antony Blackbird, MSW, LCSW Clinical Social Worker    Expected Discharge Plan: Skilled Nursing Facility Barriers to Discharge: Insurance Authorization  Expected Discharge Plan and Services Expected Discharge Plan: Skilled Nursing Facility       Living arrangements for the past 2 months: Independent Living Facility [facility]) Expected Discharge Date: 09/01/22                                     Social Determinants of Health (SDOH) Interventions    Readmission Risk Interventions     No data to display

## 2022-09-01 NOTE — Progress Notes (Addendum)
      [address]       [place] [address]             [phone]      6 Days Post-Op Procedure(s) (LRB): CORONARY ARTERY BYPASS GRAFTING (CABG) X THREE, USING LEFT INTERNAL MAMMARY ARTERY AND RIGHT LEG GREATER SAPHENOUS VEIN HARVESTED ENDOSCOPICALLY (N/A) TRANSESOPHAGEAL ECHOCARDIOGRAM (TEE) (N/A)  Subjective:  Patient with nausea.  This is a chronic issue for her.  She is requesting nausea medicine for discharge.  + ambulation   + BM  Objective: Vital signs in last 24 hours: Temp:  [97.7 F (36.5 C)-98.6 F (37 C)] 98.5 F (36.9 C) (09/11 0400) Pulse Rate:  [58-84] 67 (09/11 0400) Cardiac Rhythm: Normal sinus rhythm (09/10 2022) Resp:  [11-20] 17 (09/11 0400) BP: (101-153)/(42-70) 117/52 (09/11 0400) SpO2:  [92 %-96 %] 95 % (09/11 0400) Weight:  [78.2 kg] 78.2 kg (09/11 0400)  Intake/Output from previous day: 09/10 0701 - 09/11 0700 In: 240 [P.O.:240] Out: -   General appearance: alert, cooperative, and no distress Heart: regular rate and rhythm Lungs: clear to auscultation bilaterally Abdomen: soft, non-tender; bowel sounds normal; no masses,  no organomegaly Extremities: edema trace Wound: clean and dry  Lab Results: Recent Labs    08/30/22 0500  WBC 5.9  HGB 8.7*  HCT 26.3*  PLT 164   BMET:  Recent Labs    08/31/22 0510 09/01/22 0400  NA 140 139  K 4.0 4.1  CL 103 104  CO2 28 26  GLUCOSE 105* 111*  BUN 38* 38*  CREATININE 1.40* 1.32*  CALCIUM 9.0 8.8*    PT/INR: No results for input(s): "LABPROT", "INR" in the last 72 hours. ABG    Component Value Date/Time   PHART 7.386 08/26/2022 2202   HCO3 22.7 08/26/2022 2202   TCO2 24 08/26/2022 2202   ACIDBASEDEF 2.0 08/26/2022 2202   O2SAT 65.6 08/27/2022 0407   CBG (last 3)  Recent Labs    08/31/22 1649 08/31/22 2110 09/01/22 0601  GLUCAP 110* 88 96    Assessment/Plan: S/P Procedure(s) (LRB): CORONARY ARTERY BYPASS GRAFTING (CABG) X THREE, USING LEFT INTERNAL MAMMARY  ARTERY AND RIGHT LEG GREATER SAPHENOUS VEIN HARVESTED ENDOSCOPICALLY (N/A) TRANSESOPHAGEAL ECHOCARDIOGRAM (TEE) (N/A)  CV- PAF, maintaining NSR- continue Lopressor, Amiodarone 400 mg BID  Pulm- no acute issues, continue IS, patient using CPAP nightly Renal- H/O CKD, creatinine continues to trend down to 1.32, mild edema on exam, K is at 4.1, will give short course of Lasix CBGs controlled, patient is not a diabetic will stop SSIP Deconditioning- patient for SNF at [facility]- patient stable, maintaining NSR, nausea is chronic will provide zofran at discharge, will d/c to SNF today   LOS: 12 days    Lowella Dandy, PA-C 09/01/2022  Patient examined, surgical incisions all healing well. Chest tube sutures removed. In sinus rhythm today and feels well. We will transfer to [facility] rehab facility/SNF today  The patient needed urgent CABG for critical left main stenosis and acute coronary syndrome She has spinal stenosis and was scheduled to be evaluated for spine surgery by her Neurosurgeon when admitted for severe cardiac symptoms. She has difficulty with walking and is a fall risk.  Discharge instructions reviewed with patient.  patient examined and medical record reviewed,agree with above note. Lovett Sox 09/01/2022

## 2022-09-01 NOTE — Progress Notes (Signed)
Pt will self administer home CPAP when she is ready for rest. 

## 2022-09-01 NOTE — Progress Notes (Signed)
Mobility Specialist Progress Note:   09/01/22 1414  Mobility  Activity Ambulated with assistance in hallway  Level of Assistance Minimal assist, patient does 75% or more  Assistive Device Front wheel walker  Distance Ambulated (ft) 120 ft  Activity Response Tolerated well  $Mobility charge 1 Mobility   Pt received in bed willing to participate in mobility. Complaints of incisional pain. MinA for bed mobility and cues for positioning. Left in bed with call bell in reach and all needs met.   [facility] Lisa [name] Mobility Specialist

## 2022-09-01 NOTE — Progress Notes (Signed)
Physical Therapy Treatment Patient Details Name: Lisa [name], Dr. MRN: [id number] DOB: 1941-11-06 Today's Date: 09/01/2022   History of Present Illness 81 yo female admitted 8/30 with CAD. Pt s/p Cath 8/30 and CABG x 3 9/5. PMhx: CVA, HLD, HTN, GERD    PT Comments    Pt very motivated and making progress with improving AMPAC score; however, she lives alone and with sternal precautions would have difficulty and decreased safety managing alone. Still requiring assistance with bed mobility and head elevated and assistance/cues with ADLS during session (assist to don gown safely).Pt also with some inconsistencies in mobility level (unable to walk/mobilize yesterday due to nausea).  Considering these factors - continue to recommend SNF.  Recommendations for follow up therapy are one component of a multi-disciplinary discharge planning process, led by the attending physician.  Recommendations may be updated based on patient status, additional functional criteria and insurance authorization.  Follow Up Recommendations  Skilled nursing-short term rehab (<3 hours/day) Can patient physically be transported by private vehicle: Yes   Assistance Recommended at Discharge Intermittent Supervision/Assistance  Patient can return home with the following A little help with walking and/or transfers;A little help with bathing/dressing/bathroom;Assistance with cooking/housework   Equipment Recommendations  Rolling walker (2 wheels)    Recommendations for Other Services       Precautions / Restrictions Precautions Precautions: Sternal;Fall;Other (comment) Precaution Booklet Issued: Yes (comment) Restrictions Weight Bearing Restrictions: Yes RUE Partial Weight Bearing Percentage or Pounds: sternal prec LUE Partial Weight Bearing Percentage or Pounds: sternal prec     Mobility  Bed Mobility Overal bed mobility: Needs Assistance Bed Mobility: Supine to Sit, Sit to Supine     Supine to sit: Min  assist Sit to supine: Min guard   General bed mobility comments: HOB elevated and increased effort for transfer back to bed    Transfers Overall transfer level: Needs assistance Equipment used: Rolling walker (2 wheels) Transfers: Sit to/from Stand Sit to Stand: Supervision           General transfer comment: Performed x 3 with min cues for sternal precautions    Ambulation/Gait Ambulation/Gait assistance: Min guard Gait Distance (Feet): 300 Feet Assistive device: Rolling walker (2 wheels) Gait Pattern/deviations: Step-through pattern, Decreased stride length Gait velocity: decraesed     General Gait Details: Tendency to let walker get too far forward - cued for improved posture and better arm position; rest breaks with DOE on 2/4   Stairs             Wheelchair Mobility    Modified Rankin (Stroke Patients Only)       Balance Overall balance assessment: Needs assistance Sitting-balance support: Feet supported Sitting balance-Leahy Scale: Good     Standing balance support: Bilateral upper extremity supported Standing balance-Leahy Scale: Poor Standing balance comment: RW for standing and gait                            Cognition Arousal/Alertness: Awake/alert Behavior During Therapy: WFL for tasks assessed/performed Overall Cognitive Status: Within Functional Limits for tasks assessed                                 General Comments: Needed min cues for sternal precautions        Exercises      General Comments General comments (skin integrity, edema, etc.): VSS on RA but was short of  breath with activity requiring rest breaks; educated on precautions with activities      Pertinent Vitals/Pain Pain Assessment Pain Assessment: Faces Faces Pain Scale: Hurts little more Pain Location: back and Op site Pain Descriptors / Indicators: Discomfort Pain Intervention(s): Limited activity within patient's tolerance, Monitored  during session    Home Living                          Prior Function            PT Goals (current goals can now be found in the care plan section) Progress towards PT goals: Progressing toward goals    Frequency    Min 3X/week      PT Plan Current plan remains appropriate    Co-evaluation              AM-PAC PT "6 Clicks" Mobility   Outcome Measure  Help needed turning from your back to your side while in a flat bed without using bedrails?: A Little Help needed moving from lying on your back to sitting on the side of a flat bed without using bedrails?: A Lot Help needed moving to and from a bed to a chair (including a wheelchair)?: A Little Help needed standing up from a chair using your arms (e.g., wheelchair or bedside chair)?: A Little Help needed to walk in hospital room?: A Little Help needed climbing 3-5 steps with a railing? : A Lot 6 Click Score: 16    End of Session Equipment Utilized During Treatment: Gait belt Activity Tolerance: Patient tolerated treatment well (with rest breaks) Patient left: in bed;with call bell/phone within reach Nurse Communication: Mobility status PT Visit Diagnosis: Other abnormalities of gait and mobility (R26.89);Difficulty in walking, not elsewhere classified (R26.2);Muscle weakness (generalized) (M62.81)     Time: 4193-7902 PT Time Calculation (min) (ACUTE ONLY): 31 min  Charges:  $Gait Training: 8-22 mins $Therapeutic Activity: 8-22 mins                     Lisa [name], PT Acute Rehab [facility] Rehab [phone]    Lisa [name] 09/01/2022, 11:13 AM

## 2022-09-02 ENCOUNTER — Ambulatory Visit: Payer: Medicare Other | Admitting: Physician Assistant

## 2022-09-02 DIAGNOSIS — Z48812 Encounter for surgical aftercare following surgery on the circulatory system: Secondary | ICD-10-CM | POA: Diagnosis not present

## 2022-09-02 DIAGNOSIS — E7800 Pure hypercholesterolemia, unspecified: Secondary | ICD-10-CM | POA: Diagnosis not present

## 2022-09-02 DIAGNOSIS — G4730 Sleep apnea, unspecified: Secondary | ICD-10-CM | POA: Diagnosis not present

## 2022-09-02 DIAGNOSIS — R011 Cardiac murmur, unspecified: Secondary | ICD-10-CM | POA: Diagnosis not present

## 2022-09-02 DIAGNOSIS — M4800 Spinal stenosis, site unspecified: Secondary | ICD-10-CM | POA: Diagnosis not present

## 2022-09-02 DIAGNOSIS — I4891 Unspecified atrial fibrillation: Secondary | ICD-10-CM | POA: Diagnosis not present

## 2022-09-02 DIAGNOSIS — M6281 Muscle weakness (generalized): Secondary | ICD-10-CM | POA: Diagnosis not present

## 2022-09-02 DIAGNOSIS — R072 Precordial pain: Secondary | ICD-10-CM | POA: Diagnosis not present

## 2022-09-02 DIAGNOSIS — J309 Allergic rhinitis, unspecified: Secondary | ICD-10-CM | POA: Diagnosis not present

## 2022-09-02 DIAGNOSIS — Z951 Presence of aortocoronary bypass graft: Secondary | ICD-10-CM | POA: Diagnosis not present

## 2022-09-02 DIAGNOSIS — N1831 Chronic kidney disease, stage 3a: Secondary | ICD-10-CM | POA: Diagnosis not present

## 2022-09-02 DIAGNOSIS — J301 Allergic rhinitis due to pollen: Secondary | ICD-10-CM | POA: Diagnosis not present

## 2022-09-02 DIAGNOSIS — I129 Hypertensive chronic kidney disease with stage 1 through stage 4 chronic kidney disease, or unspecified chronic kidney disease: Secondary | ICD-10-CM | POA: Diagnosis not present

## 2022-09-02 DIAGNOSIS — I10 Essential (primary) hypertension: Secondary | ICD-10-CM | POA: Diagnosis not present

## 2022-09-02 DIAGNOSIS — R278 Other lack of coordination: Secondary | ICD-10-CM | POA: Diagnosis not present

## 2022-09-02 DIAGNOSIS — M159 Polyosteoarthritis, unspecified: Secondary | ICD-10-CM | POA: Diagnosis not present

## 2022-09-02 DIAGNOSIS — M479 Spondylosis, unspecified: Secondary | ICD-10-CM | POA: Diagnosis not present

## 2022-09-02 DIAGNOSIS — I25110 Atherosclerotic heart disease of native coronary artery with unstable angina pectoris: Secondary | ICD-10-CM | POA: Diagnosis not present

## 2022-09-02 DIAGNOSIS — Z741 Need for assistance with personal care: Secondary | ICD-10-CM | POA: Diagnosis not present

## 2022-09-02 DIAGNOSIS — D62 Acute posthemorrhagic anemia: Secondary | ICD-10-CM | POA: Diagnosis not present

## 2022-09-02 DIAGNOSIS — M810 Age-related osteoporosis without current pathological fracture: Secondary | ICD-10-CM | POA: Diagnosis not present

## 2022-09-02 MED ORDER — AMIODARONE HCL 200 MG PO TABS: 200.0000 mg | ORAL_TABLET | Freq: Every day | ORAL | Status: DC

## 2022-09-02 NOTE — Progress Notes (Signed)
CARDIAC REHAB PHASE I   PRE:  Rate/Rhythm:  83 NSR   BP:  Sitting: 113/53       SaO2: 95 RA  MODE:  Ambulation: 380 ft   POST:  Rate/Rhythm: 101 ST  BP:  Sitting: 133/54      SaO2: 100 RA  Pt was ambulated through hallway with standby assistance and RW. Pt was asymptomatic throughout ambulation and was returned to chair with call button in hand and needs met. and was returned to chair Reviewed restrictions, sternal precautions, and importance of IS use before leaving.  0800-0840      E   8:36 AM 09/02/2022     

## 2022-09-02 NOTE — Progress Notes (Signed)
      [address]       [place] [address]             [phone]      7 Days Post-Op Procedure(s) (LRB): CORONARY ARTERY BYPASS GRAFTING (CABG) X THREE, USING LEFT INTERNAL MAMMARY ARTERY AND RIGHT LEG GREATER SAPHENOUS VEIN HARVESTED ENDOSCOPICALLY (N/A) TRANSESOPHAGEAL ECHOCARDIOGRAM (TEE) (N/A) Subjective: Sitting up eating breakfast. Says she feels good and is hoping to transition to the SNF at [facility] today.   Objective: Vital signs in last 24 hours: Temp:  [98.4 F (36.9 C)-98.8 F (37.1 C)] 98.5 F (36.9 C) (09/12 0344) Pulse Rate:  [56-81] 62 (09/12 0344) Cardiac Rhythm: Normal sinus rhythm (09/11 2149) Resp:  [16-20] 19 (09/12 0344) BP: (119-130)/(56-68) 121/56 (09/12 0344) SpO2:  [95 %-99 %] 98 % (09/12 0344)    Intake/Output from previous day: 09/11 0701 - 09/12 0700 In: 240 [P.O.:240] Out: -  Intake/Output this shift: No intake/output data recorded.  General appearance: alert, cooperative, and no distress Neurologic: intact Heart: RRR, no significant arrhythmias Lungs: clear breath sounds, sats stable on RA.  Abdomen: soft, NT Extremities: no peripheral edema. TheRLE EVH incision is well approximated and dry.  Wound: the sternotomy is intact and dry.   Lab Results: No results for input(s): "WBC", "HGB", "HCT", "PLT" in the last 72 hours. BMET:  Recent Labs    08/31/22 0510 09/01/22 0400  NA 140 139  K 4.0 4.1  CL 103 104  CO2 28 26  GLUCOSE 105* 111*  BUN 38* 38*  CREATININE 1.40* 1.32*  CALCIUM 9.0 8.8*    PT/INR: No results for input(s): "LABPROT", "INR" in the last 72 hours. ABG    Component Value Date/Time   PHART 7.386 08/26/2022 2202   HCO3 22.7 08/26/2022 2202   TCO2 24 08/26/2022 2202   ACIDBASEDEF 2.0 08/26/2022 2202   O2SAT 65.6 08/27/2022 0407   CBG (last 3)  Recent Labs    09/01/22 1142 09/01/22 1620 09/01/22 2106  GLUCAP 131* 111* 150*    Assessment/Plan: S/P Procedure(s) (LRB): CORONARY  ARTERY BYPASS GRAFTING (CABG) X THREE, USING LEFT INTERNAL MAMMARY ARTERY AND RIGHT LEG GREATER SAPHENOUS VEIN HARVESTED ENDOSCOPICALLY (N/A) TRANSESOPHAGEAL ECHOCARDIOGRAM (TEE) (N/A)  -Postop day 7 CABG x3 for left main and multivessel coronary artery disease presenting with unstable angina and normal LV function.  Vital signs and cardiac rhythm are stable.  On ASA, Plavix, statin, BB.  Progressing with mobility.     -Post-op atrial fibrillation- maintaining SR, decreasing the amiodarone to 200mg  daily.   -Pulm-stable respiratory status.  O2 sats good on RA. Using her CPAP at night.   -Endo-no history of diabetes mellitus but preop hemoglobin A1c was 6.3.  SSI discontinued   -GI-postop nausea-resolved.  Abdominal exam benign.  Continue PPI.   -RENAL- h/o stage II CKD.  Creatinine back to baseline ~1.3.   -Disposition- planning transition to [facility] in [place] when insurance approval is arranged. Medically ready for discharge.    LOS: 13 days    Lisa [name], Lisa [name] [place] 09/02/2022

## 2022-09-02 NOTE — TOC Transition Note (Signed)
Transition of Care [facility]) - CM/SW Discharge Note   Patient Details  Name: Lisa [name], Dr. MRN: [id number] Date of Birth: 12/13/1941  Transition of Care [facility]) CM/SW Contact:  Eduard Roux, LCSW Phone Number: 09/02/2022, 10:29 AM   Clinical Narrative:     Patient will Discharge to: [facility] Discharge Date:09/02/2022 Family Notified:daughter Carollee Herter Transport By: private car/Shannon  Per MD patient is ready for discharge. RN, patient, and facility notified of discharge. Discharge Summary sent to facility. RN given number for [phone], Room 118. Ambulance transport requested for patient.   Clinical Social Worker signing off.  Antony Blackbird, MSW, LCSW Clinical Social Worker     Final next level of care: Skilled Nursing Facility Barriers to Discharge: Barriers Resolved   Patient Goals and CMS Choice Patient states their goals for this hospitalization and ongoing recovery are:: SNF CMS Medicare.gov Compare Post Acute Care list provided to:: Patient Choice offered to / list presented to : Patient  Discharge Placement              Patient chooses bed at:  [facility]) Patient to be transferred to facility by: family Name of family member notified: family Patient and family notified of of transfer: 09/02/22  Discharge Plan and Services                                     Social Determinants of Health (SDOH) Interventions     Readmission Risk Interventions     No data to display

## 2022-09-02 NOTE — Plan of Care (Signed)
DISCHARGE NOTE HOME [name], Dr. to be discharged to [facility] per MD order. Discussed prescriptions and follow up appointments with the patient. Prescriptions given to patient; medication list explained in detail. Patient verbalized understanding.  Skin clean, dry and intact without evidence of skin break down, no evidence of skin tears noted. PICC line discontinued intact. Site without signs and symptoms of complications. . No complaints noted.  Patient free of lines, drains, and wounds.   An After Visit Summary (AVS) was printed and given to the patient. Patient escorted via wheelchair, and discharged to [facility] via private auto.  Arlice Colt, RN

## 2022-09-02 NOTE — Progress Notes (Signed)
Per MD order, PICC line removed. Cath intact at 38cm. Vaseline pressure gauze to site, pressure held x 5min. No bleeding to site. Pt instructed to keep dressing CDI x 24 hours. Avoid heavy lifting, pushing or pulling x 24 hours,  If bleeding occurs hold pressure, if bleeding does not stop contact MD or go to the ED. Pt does not have any questions. Yogesh Cominsky M  

## 2022-09-02 NOTE — TOC Progression Note (Signed)
Transition of Care [facility]) - Progression Note    Patient Details  Name: Lisa [name], Dr. MRN: [id number] Date of Birth: [birth year]  Transition of Care [facility]) CM/SW Contact  Eduard Roux, [place] Phone Number: 09/02/2022, 8:53 AM  Clinical Narrative:     Received insurance auth for SNF from  9/11-9/13  [id number] reference #[id number]  Expected Discharge Plan: Skilled Nursing Facility Barriers to Discharge: Insurance Authorization  Expected Discharge Plan and Services Expected Discharge Plan: Skilled Nursing Facility       Living arrangements for the past 2 months: Independent Living Facility [facility]) Expected Discharge Date: 09/01/22                                     Social Determinants of Health (SDOH) Interventions    Readmission Risk Interventions     No data to display

## 2022-09-03 ENCOUNTER — Non-Acute Institutional Stay (SKILLED_NURSING_FACILITY): Payer: Medicare Other | Admitting: Student

## 2022-09-03 ENCOUNTER — Encounter: Payer: Self-pay | Admitting: Student

## 2022-09-03 DIAGNOSIS — M810 Age-related osteoporosis without current pathological fracture: Secondary | ICD-10-CM

## 2022-09-03 DIAGNOSIS — Z951 Presence of aortocoronary bypass graft: Secondary | ICD-10-CM

## 2022-09-03 DIAGNOSIS — M159 Polyosteoarthritis, unspecified: Secondary | ICD-10-CM | POA: Diagnosis not present

## 2022-09-03 DIAGNOSIS — N1831 Chronic kidney disease, stage 3a: Secondary | ICD-10-CM

## 2022-09-03 DIAGNOSIS — E7800 Pure hypercholesterolemia, unspecified: Secondary | ICD-10-CM

## 2022-09-03 DIAGNOSIS — M150 Primary generalized (osteo)arthritis: Secondary | ICD-10-CM

## 2022-09-03 DIAGNOSIS — I10 Essential (primary) hypertension: Secondary | ICD-10-CM | POA: Diagnosis not present

## 2022-09-03 DIAGNOSIS — I4891 Unspecified atrial fibrillation: Secondary | ICD-10-CM

## 2022-09-03 DIAGNOSIS — J301 Allergic rhinitis due to pollen: Secondary | ICD-10-CM

## 2022-09-03 MED FILL — Calcium Chloride Inj 10%: INTRAVENOUS | Qty: 10 | Status: AC

## 2022-09-03 MED FILL — Sodium Chloride IV Soln 0.9%: INTRAVENOUS | Qty: 2000 | Status: AC

## 2022-09-03 MED FILL — Magnesium Sulfate Inj 50%: INTRAMUSCULAR | Qty: 10 | Status: AC

## 2022-09-03 MED FILL — Electrolyte-R (PH 7.4) Solution: INTRAVENOUS | Qty: 4000 | Status: AC

## 2022-09-03 MED FILL — Lidocaine HCl Local Soln Prefilled Syringe 100 MG/5ML (2%): INTRAMUSCULAR | Qty: 5 | Status: AC

## 2022-09-03 MED FILL — Potassium Chloride Inj 2 mEq/ML: INTRAVENOUS | Qty: 40 | Status: AC

## 2022-09-03 MED FILL — Lidocaine HCl Local Preservative Free (PF) Inj 2%: INTRAMUSCULAR | Qty: 15 | Status: AC

## 2022-09-03 MED FILL — Heparin Sodium (Porcine) Inj 1000 Unit/ML: INTRAMUSCULAR | Qty: 20 | Status: AC

## 2022-09-03 MED FILL — Sodium Bicarbonate IV Soln 8.4%: INTRAVENOUS | Qty: 50 | Status: AC

## 2022-09-03 MED FILL — Heparin Sodium (Porcine) Inj 1000 Unit/ML: Qty: 1000 | Status: AC

## 2022-09-03 MED FILL — Mannitol IV Soln 20%: INTRAVENOUS | Qty: 500 | Status: AC

## 2022-09-03 NOTE — Progress Notes (Signed)
Provider:  Dr.Wesly Whisenant Location:   [place] Room Number: 118-A Place of Service:  SNF (31)  PCP: Charlane Ferretti, MD Patient Care Team: Charlane Ferretti, MD as PCP - General (Internal Medicine) Kate Sable, MD as PCP - Cardiology (Cardiology)  Extended Emergency Contact Information Primary Emergency Contact: Rogelio Seen of [place] Phone: [phone] Relation: Daughter Secondary Emergency Contact: Rockne Menghini [place] of [place] Mobile Phone: [phone] Relation: Friend  Code Status:  Goals of Care: Advanced Directive information    08/31/2022   11:00 PM  Advanced Directives  Does Patient Have a Medical Advance Directive? Yes  Type of Advance Directive Living will  Does patient want to make changes to medical advance directive? No - Patient declined      Chief Complaint  Patient presents with   New Admit To SNF    Admission.    HPI: Patient is a 81 y.o. female seen today for admission to admission. Until 3 weeks ago she had no idea she had a heart issue. She thorught it was respiratory. She ha shitory of pneumonia in the past. She had RSV after Christmas and was very sick for 2 mo. [place] 1 [place]. She had another respiratory. She went in t5x for antibiotics and finally Dr. 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 They did a full work up in Cardiology - CT scan and PT said to do it in [place] for a catheterization. The moment the catheterization was over and they noted the arteries were all clogged and she needed the open heart surgery. The positive thing was that she had to stay for 5d to clear the Plavix which was helpful for her to get things settled befor the surgery and prepareation. Dr. Melvenia Needles was a Dance movement psychotherapist. She had great care at [facility] in general.   Today, she denies chest pain or  shortness of breath. Prior to the hospital her activity level [place]. She could go in the shopping centerwith a shopping cart. She drives, laundry, and completely independent. Her only issue has been shortness of breath. Her family comes to see her for the holidays and sh eis able to handle all of those very well. E feels the best she has since long before the surgery. She thought she was slowing down because of age, but she feels great  She has back issues and has 2 rods and 6 screws with arthritis. She has a back Psychologist, sport and exercise - in 2014 and they aren't eager to do surgery because it's in the L5 area and too close to the sacrum. Since being here she has received shots in her spine. Sitting she is fine, but activity is difficult. She had two falls one was after bathing a dog in the shower and those were in the last several months. Since she has been still her spine has not been as painful for her. She isn't thinking about surgery at this time but may continue injections if she has issues.   Patient was admitted for CABG x3. Lasix or 5 days for swelling.   Daughter Larene Beach is at bedside to aid with history   She recently had her PCP has retired and everyone in the practice, she may want to see me.   Father had 6 strokes. Mother and brother  had a streoke. She had a stroke twoo. Father had MI as well.  Past Medical History:  Diagnosis Date   Acne rosacea    Allergic rhinitis    Chronic kidney disease    kidney stones, Nov. 2013- tx with Lithotripsy, still has some present    Cough variant asthma    Degenerative disc disease    knees, ankles, back- OA   GERD (gastroesophageal reflux disease)    Headache(784.0)    since stopping aleve- 07/30/2013   Hyperlipemia    dyslipidemia   Hypertension    [facility] cardiac pharmacist follows pt. [name] ,has never taken anti-hypertensive   Osteoarthritis    Plantar fasciitis    (using orthotics-they are wearing out)- Dr  Carrolyn Leigh   Pneumonia    double pneumonia - relative to sinus infection , had chemical cauterization    PONV (postoperative nausea and vomiting)    urgency, N&V for days, memory & processing   Sleep apnea    Bringing cpap mask and tubing, study last done- 2006   Past Surgical History:  Procedure Laterality Date   ABDOMINAL HYSTERECTOMY  1990's   BLADDER REPAIR     BLADDER SUSPENSION  90's   CORONARY ARTERY BYPASS GRAFT N/A 08/26/2022   Procedure: CORONARY ARTERY BYPASS GRAFTING (CABG) X THREE, USING LEFT INTERNAL MAMMARY ARTERY AND RIGHT LEG GREATER SAPHENOUS VEIN HARVESTED ENDOSCOPICALLY;  Surgeon: Dahlia Byes, MD;  Location: [place];  Service: Open Heart Surgery;  Laterality: N/A;   LEFT HEART CATH AND CORONARY ANGIOGRAPHY N/A 08/20/2022   Procedure: LEFT HEART CATH AND CORONARY ANGIOGRAPHY;  Surgeon: Wellington Hampshire, MD;  Location: [place] [facility];  Service: Cardiovascular;  Laterality: N/A;   MAXIMUM ACCESS (MAS)POSTERIOR LUMBAR INTERBODY FUSION (PLIF) 2 LEVEL N/A 08/12/2013   Procedure: FOR MAXIMUM ACCESS (MAS) POSTERIOR LUMBAR INTERBODY FUSION (PLIF) 2 LEVEL;  Surgeon: Eustace Moore, MD;  Location: [place] [facility];  Service: Neurosurgery;  Laterality: N/A;  FOR MAXIMUM ACCESS (MAS) POSTERIOR LUMBAR INTERBODY FUSION (PLIF) 2 LEVEL   MULTIPLE TOOTH EXTRACTIONS     wisdom teeth extractions    POSTERIOR FUSION LUMBAR SPINE  08/12/2013   Dr Ronnald Ramp   TEE WITHOUT CARDIOVERSION N/A 08/26/2022   Procedure: TRANSESOPHAGEAL ECHOCARDIOGRAM (TEE);  Surgeon: Dahlia Byes, MD;  Location: [place];  Service: Open Heart Surgery;  Laterality: N/A;   TONSILLECTOMY      reports that she has never smoked. She has never used smokeless tobacco. She reports current alcohol use. She reports that she does not use drugs. Social History   Socioeconomic History   Marital status: Divorced    Spouse name: Not on file   Number of children: Not on file   Years of education: Not on file   Highest education level: Not  on file  Occupational History   Occupation: retired  Tobacco Use   Smoking status: Never   Smokeless tobacco: Never  Substance and Sexual Activity   Alcohol use: Yes    Comment: on occassion   Drug use: No   Sexual activity: Not on file  Other Topics Concern   Not on file  Social History Narrative   Lives alone   Left Handed   Drinks 2-3 cups caffeine daily   Social Determinants of Health   Financial Resource Strain: Not on file  Food Insecurity: No Food Insecurity (08/28/2022)   Hunger Vital Sign    Worried About Running Out of Food in the Last Year: Never true  Ran Out of Food in the Last Year: Never true  Transportation Needs: No Transportation Needs (08/28/2022)   PRAPARE - Hydrologist (Medical): No    Lack of Transportation (Non-Medical): No  Physical Activity: Not on file  Stress: Not on file  Social Connections: Not on file  Intimate Partner Violence: Not At Risk (08/28/2022)   Humiliation, Afraid, Rape, and Kick questionnaire    Fear of Current or Ex-Partner: No    Emotionally Abused: No    Physically Abused: No    Sexually Abused: No    Functional Status Survey:    Family History  Problem Relation Age of Onset   Heart Problems Mother    Heart Problems Father    Heart attack Father    CVA Father    Heart disease Father     Health Maintenance  Topic Date Due   COVID-19 Vaccine (1) Never done   TETANUS/TDAP  Never done   Pneumonia Vaccine 60+ Years old (1 - PCV) Never done   INFLUENZA VACCINE  Never done   DEXA SCAN  Completed   Zoster Vaccines- Shingrix  Completed   HPV VACCINES  Aged Out    Allergies  Allergen Reactions   Other Other (See Comments)    Anesthesia causes nausea and vomiting, memory difficulty and problems processing.   Oxycodone Nausea Only   Sulfa Antibiotics Rash   Sulfasalazine Rash    Allergies as of 09/03/2022       Reactions   Other Other (See Comments)   Anesthesia causes nausea and  vomiting, memory difficulty and problems processing.   Oxycodone Nausea Only   Sulfa Antibiotics Rash   Sulfasalazine Rash        Medication List        Accurate as of September 03, 2022  9:02 AM. If you have any questions, ask your nurse or doctor.          acetaminophen 500 MG tablet Commonly known as: TYLENOL Take 500 mg by mouth 2 (two) times daily.   acetaminophen 500 MG tablet Commonly known as: TYLENOL Take 1-2 tablets (500-1,000 mg total) by mouth every 6 (six) hours as needed for mild pain or fever. 1000 mg in the morning and at night  Additional 1000 mg daily at mid day if needed.   alendronate 70 MG tablet Commonly known as: FOSAMAX Take 70 mg by mouth once a week. Take with a full glass of water on an empty stomach.  Saturday Morning   amiodarone 200 MG tablet Commonly known as: PACERONE Take 200 mg by mouth 2 (two) times daily. What changed: Another medication with the same name was removed. Continue taking this medication, and follow the directions you see here. Changed by: Dewayne Shorter, MD   APPLE CIDER VINEGAR PO Take 1 tablet by mouth daily.   aspirin EC 325 MG tablet Take 1 tablet (325 mg total) by mouth daily.   atorvastatin 80 MG tablet Commonly known as: LIPITOR Take 1 tablet (80 mg total) by mouth at bedtime.   EMERGEN-C IMMUNE PLUS/VIT D PO Take 2 tablets by mouth daily.   fenofibrate 145 MG tablet Commonly known as: TRICOR Take 145 mg by mouth daily. What changed: Another medication with the same name was removed. Continue taking this medication, and follow the directions you see here. Changed by: Dewayne Shorter, MD   ferrous sulfate 324 MG Tbec Take 324 mg by mouth every other day.   FIBER PO Take  1 tablet by mouth every morning. Fiber well gummy   fluticasone 50 MCG/ACT nasal spray Commonly known as: FLONASE Place 2 sprays into the nose daily. Allergic rhinitis.   furosemide 40 MG tablet Commonly known as: Lasix Take 1  tablet (40 mg total) by mouth daily. X 5 days   gabapentin 100 MG capsule Commonly known as: NEURONTIN Take 2 capsules (200 mg total) by mouth 2 (two) times daily.   Magnesium 250 MG Tabs Take 250 mg by mouth at bedtime.   metoprolol tartrate 25 MG tablet Commonly known as: LOPRESSOR Take 0.5 tablets (12.5 mg total) by mouth 2 (two) times daily.   multivitamin with minerals Tabs tablet Take 1 tablet by mouth daily. Centrum  Woman 50+   Myrbetriq 50 MG Tb24 tablet Generic drug: mirabegron ER Take 50 mg by mouth at bedtime.   NEURIVA PLUS PO Take 1 capsule by mouth daily.   ondansetron 4 MG tablet Commonly known as: Zofran Take 1 tablet (4 mg total) by mouth every 8 (eight) hours as needed for nausea or vomiting.   OVER THE COUNTER MEDICATION Take 1 tablet by mouth daily. Nervive   potassium chloride 10 MEQ tablet Commonly known as: KLOR-CON M Take 2 tablets (20 mEq total) by mouth 2 (two) times daily. X 5 days   SYSTANE OP Place 1 drop into both eyes daily as needed (Dry eyes).   traMADol 50 MG tablet Commonly known as: ULTRAM Take 1 tablet (50 mg total) by mouth every 6 (six) hours as needed for moderate pain.   TURMERIC PO Take 1,000 mg by mouth 2 (two) times daily.   Vitamin D-3 125 MCG (5000 UT) Tabs Take 5,000 Units by mouth daily.        Review of Systems  Constitutional:        Improved activity level due to less shortness of breath  Respiratory:  Negative for shortness of breath.   Cardiovascular:  Negative for chest pain.  Gastrointestinal:  Negative for abdominal distention and abdominal pain.  Genitourinary:  Negative for dysuria.    Vitals:   09/03/22 0844  BP: 126/61  Pulse: 65  Resp: 16  Temp: (!) 97.4 F (36.3 C)  SpO2: 95%  Weight: 174 lb 6.4 oz (79.1 kg)  Height: 5\' 3"  (1.6 m)   Body mass index is 30.89 kg/m. Physical Exam Constitutional:      Appearance: Normal appearance.  Cardiovascular:     Rate and Rhythm: Normal rate  and regular rhythm.     Pulses: Normal pulses.     Heart sounds: Normal heart sounds.  Pulmonary:     Effort: Pulmonary effort is normal.     Breath sounds: Normal breath sounds.  Abdominal:     General: Abdomen is flat. Bowel sounds are normal. There is no distension.     Palpations: Abdomen is soft.     Tenderness: There is no abdominal tenderness.  Skin:    General: Skin is warm and dry.     Comments: CABG surgical scar well approximated, dry, clean, intact.  Neurological:     General: No focal deficit present.     Mental Status: She is alert and oriented to person, place, and time. Mental status is at baseline.  Psychiatric:        Mood and Affect: Mood normal.     Labs reviewed: Basic Metabolic Panel: Recent Labs    08/21/22 0301 08/22/22 0527 08/27/22 0407 08/27/22 1650 08/28/22 0500 08/30/22 0500 08/31/22 0510 09/01/22  0400  NA 143   < > 140 137   < > 138 140 139  K 4.3   < > 4.2 4.2   < > 3.2* 4.0 4.1  CL 111   < > 110 106   < > 103 103 104  CO2 23   < > 20* 21*   < > 26 28 26   GLUCOSE 112*   < > 143* 154*   < > 105* 105* 111*  BUN 24*   < > 21 27*   < > 34* 38* 38*  CREATININE 1.14*   < > 1.14* 1.60*   < > 1.39* 1.40* 1.32*  CALCIUM 8.9   < > 7.9* 7.9*   < > 8.1* 9.0 8.8*  MG  --    < > 2.7* 2.7*  --   --  2.4  --   PHOS 3.7  --   --   --   --   --   --   --    < > = values in this interval not displayed.   Liver Function Tests: Recent Labs    08/19/22 0957 08/21/22 0301  AST 20  --   ALT 19  --   ALKPHOS 66  --   BILITOT 0.4  --   PROT 7.5  --   ALBUMIN 4.1 3.4*   No results for input(s): "LIPASE", "AMYLASE" in the last 8760 hours. No results for input(s): "AMMONIA" in the last 8760 hours. CBC: Recent Labs    08/28/22 0500 08/29/22 0620 08/30/22 0500  WBC 11.5* 8.1 5.9  HGB 9.4* 9.4* 8.7*  HCT 28.1* 28.3* 26.3*  MCV 92.1 91.0 90.7  PLT 150 154 164   Cardiac Enzymes: No results for input(s): "CKTOTAL", "CKMB", "CKMBINDEX", "TROPONINI"  in the last 8760 hours. BNP: Invalid input(s): "POCBNP" Lab Results  Component Value Date   HGBA1C 6.3 (H) 08/21/2022   Lab Results  Component Value Date   TSH 3.164 08/30/2022   Lab Results  Component Value Date   [id number] 11/21/2020   No results found for: "FOLATE" No results found for: "IRON", "TIBC", "FERRITIN"  Imaging and Procedures obtained prior to SNF admission: DG Orthopantogram  Result Date: 08/21/2022 CLINICAL DATA:  Pre-op exam EXAM: ORTHOPANTOGRAM/PANORAMIC COMPARISON:  None Available. 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 No significant periodontal disease identified allowing for midline artifact typical of the Panorex technique. The mandible is intact. The temporomandibular joints appear unremarkable. IMPRESSION: No acute findings. Electronically Signed   By: Richardean Sale M.D.   On: 08/21/2022 16:29   [place] EKG SITE RITE  Result Date: 08/21/2022 If Site Rite image not attached, placement could not be confirmed due to current cardiac rhythm.  ECHOCARDIOGRAM COMPLETE  Result Date: 08/20/2022    ECHOCARDIOGRAM REPORT   Patient Name:   HILARIA TENESACA Fillinger Date of Exam: 08/20/2022 Medical Rec #:  [id number]        Height:       63.0 in Accession #:    [id number]       Weight:       176.8 lb Date of Birth:  [birth year]        BSA:          1.835 m Patient Age:    81 years         BP:           130/56 mmHg Patient Gender: F  HR:           66 bpm. Exam Location:  Inpatient Procedure: 2D Echo, Color Doppler and Cardiac Doppler Indications:    R07.9* Chest pain, unspecified  History:        Patient has prior history of Echocardiogram examinations, most                 recent 11/07/2020. Risk Factors:Hypertension, Dyslipidemia and                 Sleep Apnea.  Sonographer:    Irving Burton Senior RDCS Referring Phys: [id number] MUHAMMAD A ARIDA IMPRESSIONS  1. Left ventricular ejection fraction, by estimation, is 60 to 65%. The left  ventricle has normal function. The left ventricle has no regional wall motion abnormalities. Left ventricular diastolic parameters are consistent with Grade I diastolic dysfunction (impaired relaxation).  2. Right ventricular systolic function is normal. The right ventricular size is normal. There is normal pulmonary artery systolic pressure. The estimated right ventricular systolic pressure is 27.2 mmHg.  3. The mitral valve is normal in structure. Mild mitral valve regurgitation. No evidence of mitral stenosis. Moderate mitral annular calcification.  4. The aortic valve is tricuspid. There is mild calcification of the aortic valve. There is mild thickening of the aortic valve. Aortic valve regurgitation is not visualized. Aortic valve sclerosis/calcification is present, without any evidence of aortic stenosis.  5. The inferior vena cava is normal in size with greater than 50% respiratory variability, suggesting right atrial pressure of 3 mmHg. Comparison(s): No significant change from prior study. Prior images reviewed side by side. FINDINGS  Left Ventricle: Left ventricular ejection fraction, by estimation, is 60 to 65%. The left ventricle has normal function. The left ventricle has no regional wall motion abnormalities. The left ventricular internal cavity size was normal in size. There is  no left ventricular hypertrophy. Left ventricular diastolic parameters are consistent with Grade I diastolic dysfunction (impaired relaxation). Indeterminate filling pressures. Right Ventricle: The right ventricular size is normal. No increase in right ventricular wall thickness. Right ventricular systolic function is normal. There is normal pulmonary artery systolic pressure. The tricuspid regurgitant velocity is 2.46 m/s, and  with an assumed right atrial pressure of 3 mmHg, the estimated right ventricular systolic pressure is 27.2 mmHg. Left Atrium: Left atrial size was normal in size. Right Atrium: Right atrial size was  normal in size. Pericardium: There is no evidence of pericardial effusion. Mitral Valve: The mitral valve is normal in structure. Moderate mitral annular calcification. Mild mitral valve regurgitation. No evidence of mitral valve stenosis. Tricuspid Valve: The tricuspid valve is normal in structure. Tricuspid valve regurgitation is not demonstrated. No evidence of tricuspid stenosis. Aortic Valve: The aortic valve is tricuspid. There is mild calcification of the aortic valve. There is mild thickening of the aortic valve. Aortic valve regurgitation is not visualized. Aortic valve sclerosis/calcification is present, without any evidence of aortic stenosis. Pulmonic Valve: The pulmonic valve was normal in structure. Pulmonic valve regurgitation is not visualized. No evidence of pulmonic stenosis. Aorta: The aortic root is normal in size and structure. Venous: The inferior vena cava is normal in size with greater than 50% respiratory variability, suggesting right atrial pressure of 3 mmHg. IAS/Shunts: No atrial level shunt detected by color flow Doppler.  LEFT VENTRICLE PLAX 2D LVIDd:         5.10 cm   Diastology LVIDs:         3.60 cm   LV e' medial:  5.00 cm/s LV PW:         1.10 cm   LV E/e' medial:  12.4 LV IVS:        0.90 cm   LV e' lateral:   6.85 cm/s LVOT diam:     2.00 cm   LV E/e' lateral: 9.1 LV SV:         56 LV SV Index:   30 LVOT Area:     3.14 cm  RIGHT VENTRICLE RV S prime:     13.90 cm/s TAPSE (M-mode): 2.0 cm LEFT ATRIUM             Index        RIGHT ATRIUM           Index LA diam:        3.60 cm 1.96 cm/m   RA Area:     17.20 cm LA Vol (A2C):   34.1 ml 18.58 ml/m  RA Volume:   44.50 ml  24.25 ml/m LA Vol (A4C):   34.4 ml 18.75 ml/m LA Biplane Vol: 36.3 ml 19.78 ml/m  AORTIC VALVE LVOT Vmax:   66.60 cm/s LVOT Vmean:  53.800 cm/s LVOT VTI:    0.178 m  AORTA Ao Root diam: 2.90 cm Ao Asc diam:  2.90 cm MITRAL VALVE                TRICUSPID VALVE MV Area (PHT): 3.34 cm     TR Peak grad:   24.2  mmHg MV Decel Time: 227 msec     TR Vmax:        246.00 cm/s MV E velocity: 62.10 cm/s MV A velocity: 109.00 cm/s  SHUNTS MV E/A ratio:  0.57         Systemic VTI:  0.18 m                             Systemic Diam: 2.00 cm Dani Gobble Croitoru MD Electronically signed by Sanda Klein MD Signature Date/Time: 08/20/2022/3:38:15 PM    Final    CARDIAC CATHETERIZATION  Result Date: 08/20/2022   Mid LM to Ost LAD lesion is 90% stenosed.   Ost Cx to Prox Cx lesion is 99% stenosed.   Prox LAD to Mid LAD lesion is 30% stenosed.   Prox RCA lesion is 85% stenosed.   The left ventricular systolic function is normal.   LV end diastolic pressure is mildly elevated.   The left ventricular ejection fraction is 55-65% by visual estimate. 1.  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  The coronary arteries are heavily calcified. 2.  Normal LV systolic function mildly elevated left ventricular end-diastolic pressure.  No significant gradient across aortic valve. Recommendations: Recommend CABG for revascularization.  Given the patient's symptoms at rest, recommend admission to the hospital and initiation of heparin drip.  Unfortunately, the patient is on clopidogrel therapy as an outpatient and this was discontinued today.  CABG can likely be performed early next week.  The coronary anatomy is not favorable for PCI.   Assessment/Plan 1. S/P CABG x 3  Atrial Fibrillation Patient with recent admission for CABG and had onset of atrial fibrillation was started on amiodarone.  We will continue amiodarone 200 mg daily.  Continues to be asymptomatic we will monitor for symptoms.  During hospitalization had Plavix washout.  Patient was started on high-dose aspirin 325, atorvastatin 80 mg, and continued on home fenofibrate medication.  Patient started on furosemide 20 mg with potassium  supplementation will continue that.  Pain well controlled with Tylenol and as needed tramadol 50 mg every 6 hours.  She has experienced some postoperative nausea we will continue to monitor and off for Zofran 4 mg every 8 hours.  Continue as needed medications for postoperative constipation with MiraLAX and senna.  Patient with postoperative anemia, will continue iron supplementation.  Repeat CBC in 1 week.  Discussed the importance of heart healthy diet in the postoperative.  Given patient's high sodium intake.  Patient has a follow-up appointment with cardiology as well as follow-up echocardiogram to assess for any heart failure make additional dietary recommendations at that time.  Nutrition to see patient upon admission.  2. Primary osteoarthritis involving multiple joints Given patient's numerous medications we will discontinue gabapentin for pain control.  She states she does not have a history of nerve pain.  We will continue Tylenol as needed.  We will continue numerous supplementation with turmeric, magnesium to abate symptoms.  3. Hypercholesterolemia Continue home fenofibrate 145 mg.  4. Seasonal allergic rhinitis due to pollen Continue home Flonase.  5. Stage 3a chronic kidney disease (HCC) Creatinine persistently elevated in the postoperative timeframe.  Patient has maintain a hydration.  Blood pressure well controlled on current regimen.  We will collect BMP to monitor.  After assessing for current baseline will resume every 3 month lab draws.  6. Primary hypertension Blood pressure well controlled on current regimen: Continue Lopressor 25 mg daily  7.  Osteoporosis Patient with a history of osteoporosis no current fractures.  We will continue alendronate 70 mg weekly.  Family/ staff Communication: Daughter Carollee Herter present for history, nurses updated  Labs/tests ordered: CBC, BMP  Coralyn Helling, MD, [facility] [phone]

## 2022-09-04 ENCOUNTER — Other Ambulatory Visit: Payer: Self-pay | Admitting: Internal Medicine

## 2022-09-04 MED ORDER — TRAMADOL HCL 50 MG PO TABS: 50.0000 mg | ORAL_TABLET | ORAL | 0 refills | Status: DC

## 2022-09-04 NOTE — Progress Notes (Signed)
In rehab at [facility]---needs refill

## 2022-09-05 ENCOUNTER — Encounter: Payer: Self-pay | Admitting: Student

## 2022-09-09 ENCOUNTER — Ambulatory Visit: Payer: Medicare Other | Attending: Cardiology

## 2022-09-09 DIAGNOSIS — R011 Cardiac murmur, unspecified: Secondary | ICD-10-CM | POA: Diagnosis not present

## 2022-09-09 DIAGNOSIS — R072 Precordial pain: Secondary | ICD-10-CM | POA: Diagnosis not present

## 2022-09-09 LAB — ECHOCARDIOGRAM COMPLETE
AR max vel: 1.53 cm2
AV Area VTI: 1.43 cm2
AV Area mean vel: 1.43 cm2
AV Mean grad: 8 mmHg
AV Peak grad: 13.7 mmHg
Ao pk vel: 1.85 m/s
Area-P 1/2: 3.85 cm2
S' Lateral: 3.3 cm
Single Plane A4C EF: 69.4 %

## 2022-09-12 NOTE — Progress Notes (Unsigned)
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 female with history of CAD s/p 3-vessel CABG on 08/26/2022 (LIMA to LAD, SVG to OM2, SVG to RCA) with postoperative A-fib, prior CVA, CKD stage II, HTN, HLD, and GERD who presents for hospital follow-up.   She was admitted to the hospital in 10/2020 with acute left lacunar stroke.  Outpatient cardiac monitoring showed no evidence of A-fib or flutter.  Echo demonstrated a preserved LV systolic function.  She was placed on Plavix and Lipitor.   She had an RSV infection earlier in 2023 that led her to be quite sedentary with associated fatigue, weakness, dyspnea with singing.     She was evaluated on 08/08/2022 with exertional dyspnea and chest discomfort.  Given symptoms, she underwent coronary CTA on 08/15/2022 which demonstrated a calcium score of 1329 which was the 94th percentile along with distal left main 25% stenosis, proximal LAD greater than 70% stenosis, proximal LCx 25 to 49% stenosis, and aortic root/descending aortic calcification.  ctFFR was positive within the distal left main at 0.5, LAD, and LCx.  ctFFR of the RCA normal at 0.82.  Given abnormal coronary CTA, she underwent LHC on 08/20/2022, that demonstrated codominant coronary arteries with severe distal left main bifurcation disease extending into the ostium of the LAD and LCx as well as significant proximal/mid RCA stenosis.  The coronary arteries were heavily calcified.  Normal LV systolic function with mildly elevated EDP and no significant gradient across aortic valve.  Given findings, she was evaluated by CVTS for CABG.  Echo on 08/20/2022 demonstrated an EF of 60 to 65%, no regional wall motion abnormalities, grade 1 diastolic dysfunction,  normal RV systolic function, ventricular cavity size, and PASP, mild mitral regurgitation with moderate mitral annular calcification, aortic valve sclerosis without evidence of stenosis, and an estimated right atrial pressure of 3 mmHg.  No significant change when compared to prior study.  Pre-CABG vascular imaging showed normal ABIs bilaterally, 1 to 39% bilateral ICA stenosis with antegrade flow of the bilateral vertebral arteries and normal flow hemodynamics in the bilateral subclavian arteries, and normal waveforms within the right upper extremity.  She underwent three-vessel CABG on [id number] without complication with LIMA to LAD, SVG to OM 2, and SVG to RCA.  Postoperatively, she developed A-fib with conversion to sinus rhythm with amiodarone.  There was some mild AKI that improved prior to discharge as well as stable expected acute blood loss anemia.  Previously scheduled outpatient echo was performed on 09/09/2022 and demonstrated an EF of 55 to 60%, no regional wall motion abnormalities, grade 1 diastolic dysfunction, normal RV systolic function, ventricular cavity size, and PASP, mild mitral regurgitation, aortic valve sclerosis without evidence of stenosis, and an estimated right atrial pressure of 3 mmHg.  Comes in doing reasonably well from a cardiac perspective.  She is without symptoms of angina or decompensation.  She does note some generalized malaise and fatigue.  She feels like she is fighting off a URI with nasal congestion and a cough.  She also notes a longstanding history of right shoulder discomfort that has been present for approximately 3 years.  Since undergoing surgery, she has noted a worsening of this shoulder discomfort with  sharp pains shooting down into her right wrist.  She does believe these pains are improving.  No falls or symptoms concerning for bleeding.  She reports [facility] has recently drawn blood on her.  No symptoms of palpitations, dizziness, presyncope, or syncope.   Daughter is working with the patient to improve her diet.   Labs independently reviewed: 08/2022 - Hgb 9.0, PLT 299, potassium 4.3, BUN 27, serum creatinine 1.28, magnesium 2.4, TSH normal 07/2022 - A1c 6.3 LP(a) 220, albumin 4.1, AST/ALT normal, TC 171, TG 160, HDL 67, LDL 72, direct LDL 92  Past Medical History:  Diagnosis Date   Acne rosacea    Allergic rhinitis    Chronic kidney disease    kidney stones, Nov. 2013- tx with Lithotripsy, still has some present    Cough variant asthma    Degenerative disc disease    knees, ankles, back- OA   GERD (gastroesophageal reflux disease)    Headache(784.0)    since stopping aleve- 07/30/2013   Hyperlipemia    dyslipidemia   Hypertension    [facility] cardiac pharmacist follows pt. [name] ,has never taken anti-hypertensive   Osteoarthritis    Plantar fasciitis    (using orthotics-they are wearing out)- Dr Carrolyn Leigh   Pneumonia    double pneumonia - relative to sinus infection , had chemical cauterization    PONV (postoperative nausea and vomiting)    urgency, N&V for days, memory & processing   Sleep apnea    Bringing cpap mask and tubing, study last done- 2006    Past Surgical History:  Procedure Laterality Date   ABDOMINAL HYSTERECTOMY  1990's   BLADDER REPAIR     BLADDER SUSPENSION  90's   CORONARY ARTERY BYPASS GRAFT N/A 08/26/2022   Procedure: CORONARY ARTERY BYPASS GRAFTING (CABG) X THREE, USING LEFT INTERNAL MAMMARY ARTERY AND RIGHT LEG GREATER SAPHENOUS VEIN HARVESTED ENDOSCOPICALLY;  Surgeon: Dahlia Byes, MD;  Location: [place];  Service: Open Heart Surgery;  Laterality: N/A;   LEFT HEART CATH AND CORONARY ANGIOGRAPHY N/A 08/20/2022   Procedure: LEFT HEART CATH AND CORONARY ANGIOGRAPHY;  Surgeon: Wellington Hampshire, MD;  Location: [place] [facility];  Service: Cardiovascular;  Laterality: N/A;   MAXIMUM ACCESS (MAS)POSTERIOR LUMBAR INTERBODY FUSION (PLIF) 2 LEVEL N/A 08/12/2013   Procedure: FOR MAXIMUM ACCESS (MAS) POSTERIOR  LUMBAR INTERBODY FUSION (PLIF) 2 LEVEL;  Surgeon: Eustace Moore, MD;  Location: [place] [facility];  Service: Neurosurgery;  Laterality: N/A;  FOR MAXIMUM ACCESS (MAS) POSTERIOR LUMBAR INTERBODY FUSION (PLIF) 2 LEVEL   MULTIPLE TOOTH EXTRACTIONS     wisdom teeth extractions    POSTERIOR FUSION LUMBAR SPINE  08/12/2013   Dr Ronnald Ramp   TEE WITHOUT CARDIOVERSION N/A 08/26/2022   Procedure: TRANSESOPHAGEAL ECHOCARDIOGRAM (TEE);  Surgeon: Dahlia Byes, MD;  Location: [place];  Service: Open Heart Surgery;  Laterality: N/A;   TONSILLECTOMY      Current Medications: Current Meds  Medication Sig   acetaminophen (TYLENOL) 500 MG tablet Take 1-2 tablets (500-1,000 mg total) by mouth every 6 (six) hours as needed for mild pain or fever. 1000 mg in the morning and at night  Additional 1000 mg daily at mid day if needed.   alendronate (FOSAMAX) 70 MG tablet Take 70 mg by mouth once a week. Take with a full glass of water on an empty stomach.  Saturday Morning   amiodarone (PACERONE) 200 MG tablet Take 1 tablet (200 mg total) by mouth daily.   APPLE CIDER VINEGAR  PO Take 1 tablet by mouth daily.   aspirin EC 325 MG tablet Take 1 tablet (325 mg total) by mouth daily.   atorvastatin (LIPITOR) 80 MG tablet Take 1 tablet (80 mg total) by mouth at bedtime.   Cholecalciferol (VITAMIN D-3) 125 MCG (5000 UT) TABS Take 5,000 Units by mouth daily.   Cobalamin Combinations (NEURIVA PLUS PO) Take 1 capsule by mouth daily.   fenofibrate (TRICOR) 145 MG tablet Take 145 mg by mouth daily.   ferrous sulfate 324 MG TBEC Take 324 mg by mouth every other day.   fluticasone (FLONASE) 50 MCG/ACT nasal spray Place 2 sprays into the nose daily. Allergic rhinitis.   Magnesium 250 MG TABS Take 250 mg by mouth at bedtime.   Multiple Vitamin (MULTIVITAMIN WITH MINERALS) TABS Take 1 tablet by mouth daily. Centrum  Woman 50+   Multiple Vitamins-Minerals (EMERGEN-C IMMUNE PLUS/VIT D PO) Take 2 tablets by mouth daily.   MYRBETRIQ 50 MG TB24  tablet Take 50 mg by mouth at bedtime.   ondansetron (ZOFRAN) 4 MG tablet Take 1 tablet (4 mg total) by mouth every 8 (eight) hours as needed for nausea or vomiting.   OVER THE COUNTER MEDICATION Take 1 tablet by mouth daily. Nervive   Polyethyl Glycol-Propyl Glycol (SYSTANE OP) Place 1 drop into both eyes daily as needed (Dry eyes).   traMADol (ULTRAM) 50 MG tablet Take 1 tablet (50 mg total) by mouth every 4 (four) hours.   TURMERIC PO Take 1,000 mg by mouth 2 (two) times daily.   [DISCONTINUED] amiodarone (PACERONE) 200 MG tablet Take 200 mg by mouth 2 (two) times daily.   [DISCONTINUED] metoprolol tartrate (LOPRESSOR) 25 MG tablet Take 0.5 tablets (12.5 mg total) by mouth 2 (two) times daily.    Allergies:   Other, Oxycodone, Sulfa antibiotics, and Sulfasalazine   Social History   Socioeconomic History   Marital status: Divorced    Spouse name: Not on file   Number of children: Not on file   Years of education: Not on file   Highest education level: Not on file  Occupational History   Occupation: retired  Tobacco Use   Smoking status: Never   Smokeless tobacco: Never  Substance and Sexual Activity   Alcohol use: Yes    Comment: on occassion   Drug use: No   Sexual activity: Not on file  Other Topics Concern   Not on file  Social History Narrative   Lives alone   Left Handed   Drinks 2-3 cups caffeine daily   Social Determinants of Health   Financial Resource Strain: Not on file  Food Insecurity: No Food Insecurity (08/28/2022)   Hunger Vital Sign    Worried About Running Out of Food in the Last Year: Never true    Ran Out of Food in the Last Year: Never true  Transportation Needs: No Transportation Needs (08/28/2022)   PRAPARE - Hydrologist (Medical): No    Lack of Transportation (Non-Medical): No  Physical Activity: Not on file  Stress: Not on file  Social Connections: Not on file     Family History:  The patient's family history  includes CVA in her father; Heart Problems in her father and mother; Heart attack in her father; Heart disease in her father.  ROS:   12-point review of systems is negative unless otherwise noted in the HPI.   EKGs/Labs/Other Studies Reviewed:    Studies reviewed were summarized above. The additional studies were  reviewed today:  2D echo 09/09/2022: 1. Left ventricular ejection fraction, by estimation, is 55 to 60%. The  left ventricle has normal function. The left ventricle has no regional  wall motion abnormalities. Left ventricular diastolic parameters are  consistent with Grade I diastolic  dysfunction (impaired relaxation). The average left ventricular global  longitudinal strain is -16.1 %.   2. Right ventricular systolic function is normal. The right ventricular  size is normal. There is normal pulmonary artery systolic pressure. The  estimated right ventricular systolic pressure is [id number] mmHg.   3. The mitral valve is normal in structure. Mild mitral valve  regurgitation. No evidence of mitral stenosis.   4. The aortic valve was not well visualized. Aortic valve regurgitation  is not visualized. Aortic valve sclerosis/calcification is present,  without any evidence of aortic stenosis.   5. The inferior vena cava is normal in size with greater than 50%  respiratory variability, suggesting right atrial pressure of 3 mmHg.   Comparison(s): 08/20/22-EF 60-65%. __________  Intraoperative TEE 08/26/2022: POST-OP IMPRESSIONS  _ Left Ventricle: has mildly reduced systolic function, with an ejection  fraction of 50%. The cavity size was normal. The wall motion is normal.  _ Right Ventricle: The right ventricle appears unchanged from pre-bypass.  _ Aorta: The aorta appears unchanged from pre-bypass.  _ Left Atrial Appendage: The left atrial appendage appears unchanged from  pre-bypass.  _ Aortic Valve: The aortic valve appears unchanged from pre-bypass.  _ Mitral Valve: The mitral  valve appears unchanged from pre-bypass. There  is  mild regurgitation.  _ Tricuspid Valve: The tricuspid valve appears unchanged from pre-bypass.  There  is mild regurgitation.  _ Pulmonic Valve: The pulmonic valve appears unchanged from pre-bypass.  _ Interatrial Septum: The interatrial septum appears unchanged from  pre-bypass.  _ Pericardium: The pericardium appears unchanged from pre-bypass.  _ Comments: Post-bypass images reviewed with surgeon. __________  Pre-CABG peripheral arterial ultrasound 08/22/2022: Summary:  Right Carotid: Velocities in the right ICA are consistent with a 1-39%  stenosis.   Left Carotid: Velocities in the left ICA are consistent with a 1-39%  stenosis.  Vertebrals:  Bilateral vertebral arteries demonstrate antegrade flow.  Subclavians: Normal flow hemodynamics were seen in bilateral subclavian               arteries.   Right ABI: Resting right ankle-brachial index is within normal range.  Left ABI: Resting left ankle-brachial index is within normal range.   Right Upper Extremity: Doppler waveforms remain within normal limits with  right radial compression. Doppler waveforms remain within normal limits  with right ulnar compression.  Left Upper Extremity: Doppler waveforms decrease <50% w left radial  compression. Doppler waveforms decrease <50% with left ulnar compression. __________  2D echo 08/20/2022: 1. Left ventricular ejection fraction, by estimation, is 60 to 65%. The  left ventricle has normal function. The left ventricle has no regional  wall motion abnormalities. Left ventricular diastolic parameters are  consistent with Grade I diastolic  dysfunction (impaired relaxation).   2. Right ventricular systolic function is normal. The right ventricular  size is normal. There is normal pulmonary artery systolic pressure. The  estimated right ventricular systolic pressure is [id number] mmHg.   3. The mitral valve is normal in structure. Mild mitral  valve  regurgitation. No evidence of mitral stenosis. Moderate mitral annular  calcification.   4. The aortic valve is tricuspid. There is mild calcification of the  aortic valve. There is mild thickening of the aortic  valve. Aortic valve  regurgitation is not visualized. Aortic valve sclerosis/calcification is  present, without any evidence of  aortic stenosis.   5. The inferior vena cava is normal in size with greater than 50%  respiratory variability, suggesting right atrial pressure of 3 mmHg.   Comparison(s): No significant change from prior study. Prior images  reviewed side by side. __________  LHC 08/20/2022:   Mid LM to Ost LAD lesion is 90% stenosed.   Ost Cx to Prox Cx lesion is 99% stenosed.   Prox LAD to Mid LAD lesion is 30% stenosed.   Prox RCA lesion is 85% stenosed.   The left ventricular systolic function is normal.   LV end diastolic pressure is mildly elevated.   The left ventricular ejection fraction is 55-65% by visual estimate.   1.  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  The coronary arteries are heavily calcified. 2.  Normal LV systolic function mildly elevated left ventricular end-diastolic pressure.  No significant gradient across aortic valve.   Recommendations: Recommend CABG for revascularization.  Given the patient's symptoms at rest, recommend admission to the hospital and initiation of heparin drip.  Unfortunately, the patient is on clopidogrel therapy as an outpatient and this was discontinued today.  CABG can likely be performed early next week.  The coronary anatomy is not favorable for PCI. __________  Coronary CTA 08/10/2022: Aorta: Normal size. Aortic root and descending aorta calcifications. No dissection.   Aortic Valve:  Trileaflet.  No calcifications.   Coronary Arteries:  Normal coronary  origin.  Right dominance.   RCA gives rise to PDA and PLA.  There is no plaque.   Left main gives rise to LAD and LCX arteries. Calcified plaque causing mild distal LM stenosis (25%).   LAD has calcified plaque proximally causing severe stenosis (>70%).   LCX is a non-dominant artery that gives rise to three obtuse marginal branches. There is calcified plaque in the proximal LCx causing mild stenosis (25-49%).   Other findings:   Normal pulmonary vein drainage into the left atrium.   Normal left atrial appendage without a thrombus.   Normal size of the pulmonary artery.   IMPRESSION: 1. Coronary calcium score of 1329. This was 94th percentile for age and sex matched control. 2. Normal coronary origin with right dominance. 3. Severe proximal LAD stenosis (>70%). 4. Mild distal LM and proximal LCx stenosis (25-49%). 5. CAD-RADS 4 Severe stenosis. (70-99% or > 50% left main). Cardiac catheterization or CT FFR is recommended. Consider symptom-guided anti-ischemic pharmacotherapy as well as risk factor modification per guideline directed care. 6. Additional analysis with CT FFR will be submitted and reported separately.   ctFFR 1. Left Main: There is significant stenosis in the distal LM. FFRct 0.5% 2. LAD: significant stenosis. 3. LCX: significant stenosis. 4. RCA: No significant stenosis.  FFRct 0.82   IMPRESSION: 1.  CT FFR analysis showed significant stenosis in the distal LM. 2.  Recommend cardiac catheterization. __________   Elwyn Reach patch 11/2021: 1 run of paroxysmal SVT.  No evidence of atrial fibrillation or atrial flutter noted. __________   Elwyn Reach patch 10/2020: No A. fib or flutter noted to explain symptoms of stroke. Paroxysmal SVT noted __________   2D echo 10/2020: 1. Left ventricular ejection fraction, by estimation, is 55 to 60%. The  left ventricle has normal function. The left ventricle has no regional  wall motion abnormalities. Left ventricular  diastolic parameters are  consistent with Grade I diastolic  dysfunction (impaired relaxation).   2. Right ventricular systolic function is normal. The right ventricular  size is normal.   3. The mitral valve is normal in structure. No evidence of mitral valve  regurgitation. No evidence of mitral stenosis.   4. The aortic valve is tricuspid. Aortic valve regurgitation is not  visualized. No aortic stenosis is present. __________   Carlton Adam MPI 01/08/2018: Nuclear stress EF: 57%. There was no ST segment deviation noted during stress. The study is normal. The left ventricular ejection fraction is normal (55-65%).   1. EF 57%, normal wall motion.  2. No evidence for ischemia/infarction by perfusion imaging.    Normal study.    EKG:  EKG is ordered today.  The EKG ordered today demonstrates NSR, 61 bpm, specific lateral ST-T changes  Recent Labs: 08/19/2022: ALT 19 08/30/2022: Hemoglobin 8.7; Platelets 164; TSH 3.164 08/31/2022: Magnesium 2.4 09/01/2022: BUN 38; Creatinine, Ser 1.32; Potassium 4.1; Sodium 139  Recent Lipid Panel    Component Value Date/Time   CHOL 171 08/19/2022 0957   TRIG 160 (H) 08/19/2022 0957   HDL 67 08/19/2022 0957   CHOLHDL 2.6 08/19/2022 0957   VLDL 32 08/19/2022 0957   LDLCALC 72 08/19/2022 0957   LDLDIRECT 92 08/19/2022 0957    PHYSICAL EXAM:    VS:  BP 120/60 (BP Location: Left Arm, Patient Position: Sitting, Cuff Size: Normal)   Pulse 61   Ht 5\' 2"  (1.575 m)   Wt 169 lb 2 oz (76.7 kg)   SpO2 98%   BMI 30.93 kg/m   BMI: Body mass index is 30.93 kg/m.  Physical Exam Vitals reviewed.  Constitutional:      Appearance: She is well-developed.  HENT:     Head: Normocephalic and atraumatic.  Eyes:     General:        Right eye: No discharge.        Left eye: No discharge.  Neck:     Vascular: No JVD.  Cardiovascular:     Rate and Rhythm: Normal rate and regular rhythm.     Pulses:          Posterior tibial pulses are 2+ on the right  side and 2+ on the left side.     Heart sounds: S1 normal and S2 normal. Heart sounds not distant. No midsystolic click and no opening snap. Murmur heard.     Systolic murmur is present with a grade of 1/6 at the upper right sternal border.     No friction rub.     Comments: Well-healing midline anterior chest wall surgical incision site without active bleeding, bruising, warmth, erythema, or tenderness to palpation. Pulmonary:     Effort: Pulmonary effort is normal. No respiratory distress.     Breath sounds: Normal breath sounds. No decreased breath sounds, wheezing or rales.  Chest:     Chest wall: No tenderness.  Abdominal:     General: There is no distension.  Musculoskeletal:     Cervical back: Normal range of motion.     Right lower leg: No edema.     Left lower leg: No edema.  Skin:    General: Skin is warm and dry.     Nails: There is no clubbing.  Neurological:     Mental Status: She is alert and oriented to person, place, and time.  Psychiatric:        Speech:  Speech normal.        Behavior: Behavior normal.        Thought Content: Thought content normal.        Judgment: Judgment normal.     Wt Readings from Last 3 Encounters:  09/15/22 169 lb 2 oz (76.7 kg)  09/03/22 174 lb 6.4 oz (79.1 kg)  09/01/22 172 lb 6.4 oz (78.2 kg)     ASSESSMENT & PLAN:   CAD involving the native coronary artery status post three-vessel CABG on 08/26/2022 without angina: She is doing well and is without symptoms of angina or decompensation.  She does continue to note expected generalized malaise and fatigue.  Continue aggressive risk factor modification and secondary prevention including full dose aspirin at this time along with continuation of atorvastatin, fenofibrate, and metoprolol.  Follow-up with CVTS as directed.  She will be participating in cardiac rehab.  Postoperative A-fib: No further documented evidence of recurrence.  Decrease amiodarone to 200 mg daily.  If she does not have  further recurrence, would defer [place] with plans to discontinue amiodarone near future.  HTN: Blood pressure is well controlled in the office today.  She remains on metoprolol tartrate 12.5 mg twice daily.  HLD: Direct LDL 72 with triglycerides 160 in 07/2022.  Atorvastatin 80 mg and fenofibrate 145 mg.  If triglycerides remain above goal on recheck, would consider addition of Vascepa.  Anemia: Stable with expected acute blood loss anemia noted at time of hospital discharge.  Obtain labs from [facility].  CKD stage II: Stable at hospital discharge.  Obtain labs from [facility].  History of CVA: No new deficits.  She remains on aspirin, atorvastatin, and fenofibrate.    Disposition: F/u with Dr. Garen Lah or an APP in 1 month.   Medication Adjustments/Labs and Tests Ordered: Current medicines are reviewed at length with the patient today.  Concerns regarding medicines are outlined above. Medication changes, Labs and Tests ordered today are summarized above and listed in the Patient Instructions accessible in Encounters.   Signed, Christell Faith, PA-C 09/15/2022 12:40 PM     [place],  [id number] [phone]

## 2022-09-15 ENCOUNTER — Ambulatory Visit: Payer: Medicare Other | Attending: Physician Assistant | Admitting: Physician Assistant

## 2022-09-15 ENCOUNTER — Encounter: Payer: Self-pay | Admitting: Physician Assistant

## 2022-09-15 VITALS — BP 120/60 | HR 61 | Ht 62.0 in | Wt 169.1 lb

## 2022-09-15 DIAGNOSIS — I129 Hypertensive chronic kidney disease with stage 1 through stage 4 chronic kidney disease, or unspecified chronic kidney disease: Secondary | ICD-10-CM | POA: Diagnosis not present

## 2022-09-15 DIAGNOSIS — E7800 Pure hypercholesterolemia, unspecified: Secondary | ICD-10-CM | POA: Diagnosis not present

## 2022-09-15 DIAGNOSIS — Z951 Presence of aortocoronary bypass graft: Secondary | ICD-10-CM | POA: Diagnosis not present

## 2022-09-15 DIAGNOSIS — D62 Acute posthemorrhagic anemia: Secondary | ICD-10-CM | POA: Diagnosis not present

## 2022-09-15 DIAGNOSIS — R278 Other lack of coordination: Secondary | ICD-10-CM | POA: Diagnosis not present

## 2022-09-15 DIAGNOSIS — Z8673 Personal history of transient ischemic attack (TIA), and cerebral infarction without residual deficits: Secondary | ICD-10-CM

## 2022-09-15 DIAGNOSIS — E785 Hyperlipidemia, unspecified: Secondary | ICD-10-CM

## 2022-09-15 DIAGNOSIS — I9789 Other postprocedural complications and disorders of the circulatory system, not elsewhere classified: Secondary | ICD-10-CM

## 2022-09-15 DIAGNOSIS — I2510 Atherosclerotic heart disease of native coronary artery without angina pectoris: Secondary | ICD-10-CM | POA: Diagnosis not present

## 2022-09-15 DIAGNOSIS — I10 Essential (primary) hypertension: Secondary | ICD-10-CM | POA: Diagnosis not present

## 2022-09-15 DIAGNOSIS — Z741 Need for assistance with personal care: Secondary | ICD-10-CM | POA: Diagnosis not present

## 2022-09-15 DIAGNOSIS — Z48812 Encounter for surgical aftercare following surgery on the circulatory system: Secondary | ICD-10-CM | POA: Diagnosis not present

## 2022-09-15 DIAGNOSIS — N182 Chronic kidney disease, stage 2 (mild): Secondary | ICD-10-CM | POA: Diagnosis not present

## 2022-09-15 DIAGNOSIS — I4891 Unspecified atrial fibrillation: Secondary | ICD-10-CM | POA: Diagnosis not present

## 2022-09-15 DIAGNOSIS — I25110 Atherosclerotic heart disease of native coronary artery with unstable angina pectoris: Secondary | ICD-10-CM | POA: Diagnosis not present

## 2022-09-15 MED ORDER — METOPROLOL TARTRATE 25 MG PO TABS: 12.5000 mg | ORAL_TABLET | Freq: Two times a day (BID) | ORAL | 3 refills | Status: DC

## 2022-09-15 MED ORDER — AMIODARONE HCL 200 MG PO TABS: 200.0000 mg | ORAL_TABLET | Freq: Every day | ORAL | 3 refills | Status: DC

## 2022-09-15 NOTE — Patient Instructions (Signed)
PATIENT CAN TAKE UNISOM AT NIGHT.   Medication Instructions:  Your physician has recommended you make the following change in your medication:   DECREASE Amiodarone to 200 mg once daily  *If you need a refill on your cardiac medications before your next appointment, please call your pharmacy*   Lab Work: None  If you have labs (blood work) drawn today and your tests are completely normal, you will receive your results only by: [place] (if you have MyChart) OR A paper copy in the mail If you have any lab test that is abnormal or we need to change your treatment, we will call you to review the results.   Testing/Procedures: None   Follow-Up: At [facility], you and your health needs are our priority.  As part of our continuing mission to provide you with exceptional heart care, we have created designated Provider Care Teams.  These Care Teams include your primary Cardiologist (physician) and Advanced Practice Providers (APPs -  Physician Assistants and Nurse Practitioners) who all work together to provide you with the care you need, when you need it.   Your next appointment:   1 month(s)  The format for your next appointment:   In Person  Provider:   Kate Sable, MD or Christell Faith, PA-C        Important Information About Sugar

## 2022-09-17 DIAGNOSIS — Z741 Need for assistance with personal care: Secondary | ICD-10-CM | POA: Diagnosis not present

## 2022-09-17 DIAGNOSIS — R278 Other lack of coordination: Secondary | ICD-10-CM | POA: Diagnosis not present

## 2022-09-17 DIAGNOSIS — Z951 Presence of aortocoronary bypass graft: Secondary | ICD-10-CM | POA: Diagnosis not present

## 2022-09-17 DIAGNOSIS — Z48812 Encounter for surgical aftercare following surgery on the circulatory system: Secondary | ICD-10-CM | POA: Diagnosis not present

## 2022-09-17 DIAGNOSIS — I25110 Atherosclerotic heart disease of native coronary artery with unstable angina pectoris: Secondary | ICD-10-CM | POA: Diagnosis not present

## 2022-09-17 DIAGNOSIS — I4891 Unspecified atrial fibrillation: Secondary | ICD-10-CM | POA: Diagnosis not present

## 2022-09-17 DIAGNOSIS — E7800 Pure hypercholesterolemia, unspecified: Secondary | ICD-10-CM | POA: Diagnosis not present

## 2022-09-17 DIAGNOSIS — I129 Hypertensive chronic kidney disease with stage 1 through stage 4 chronic kidney disease, or unspecified chronic kidney disease: Secondary | ICD-10-CM | POA: Diagnosis not present

## 2022-09-18 DIAGNOSIS — K5901 Slow transit constipation: Secondary | ICD-10-CM | POA: Diagnosis not present

## 2022-09-18 DIAGNOSIS — I2510 Atherosclerotic heart disease of native coronary artery without angina pectoris: Secondary | ICD-10-CM | POA: Diagnosis not present

## 2022-09-18 DIAGNOSIS — F5101 Primary insomnia: Secondary | ICD-10-CM | POA: Diagnosis not present

## 2022-09-18 DIAGNOSIS — I9789 Other postprocedural complications and disorders of the circulatory system, not elsewhere classified: Secondary | ICD-10-CM | POA: Diagnosis not present

## 2022-09-18 DIAGNOSIS — Z951 Presence of aortocoronary bypass graft: Secondary | ICD-10-CM | POA: Diagnosis not present

## 2022-09-19 ENCOUNTER — Ambulatory Visit: Payer: Medicare Other | Admitting: Cardiology

## 2022-09-19 DIAGNOSIS — I129 Hypertensive chronic kidney disease with stage 1 through stage 4 chronic kidney disease, or unspecified chronic kidney disease: Secondary | ICD-10-CM | POA: Diagnosis not present

## 2022-09-19 DIAGNOSIS — I4891 Unspecified atrial fibrillation: Secondary | ICD-10-CM | POA: Diagnosis not present

## 2022-09-19 DIAGNOSIS — R278 Other lack of coordination: Secondary | ICD-10-CM | POA: Diagnosis not present

## 2022-09-19 DIAGNOSIS — I25110 Atherosclerotic heart disease of native coronary artery with unstable angina pectoris: Secondary | ICD-10-CM | POA: Diagnosis not present

## 2022-09-19 DIAGNOSIS — Z951 Presence of aortocoronary bypass graft: Secondary | ICD-10-CM | POA: Diagnosis not present

## 2022-09-19 DIAGNOSIS — E7800 Pure hypercholesterolemia, unspecified: Secondary | ICD-10-CM | POA: Diagnosis not present

## 2022-09-19 DIAGNOSIS — Z48812 Encounter for surgical aftercare following surgery on the circulatory system: Secondary | ICD-10-CM | POA: Diagnosis not present

## 2022-09-19 DIAGNOSIS — Z741 Need for assistance with personal care: Secondary | ICD-10-CM | POA: Diagnosis not present

## 2022-09-22 DIAGNOSIS — Z951 Presence of aortocoronary bypass graft: Secondary | ICD-10-CM | POA: Diagnosis not present

## 2022-09-22 DIAGNOSIS — R278 Other lack of coordination: Secondary | ICD-10-CM | POA: Diagnosis not present

## 2022-09-22 DIAGNOSIS — Z48812 Encounter for surgical aftercare following surgery on the circulatory system: Secondary | ICD-10-CM | POA: Diagnosis not present

## 2022-09-22 DIAGNOSIS — Z741 Need for assistance with personal care: Secondary | ICD-10-CM | POA: Diagnosis not present

## 2022-09-22 DIAGNOSIS — I2510 Atherosclerotic heart disease of native coronary artery without angina pectoris: Secondary | ICD-10-CM | POA: Diagnosis not present

## 2022-09-22 DIAGNOSIS — I25110 Atherosclerotic heart disease of native coronary artery with unstable angina pectoris: Secondary | ICD-10-CM | POA: Diagnosis not present

## 2022-09-22 DIAGNOSIS — I4891 Unspecified atrial fibrillation: Secondary | ICD-10-CM | POA: Diagnosis not present

## 2022-09-22 DIAGNOSIS — R2689 Other abnormalities of gait and mobility: Secondary | ICD-10-CM | POA: Diagnosis not present

## 2022-09-22 DIAGNOSIS — E7800 Pure hypercholesterolemia, unspecified: Secondary | ICD-10-CM | POA: Diagnosis not present

## 2022-09-24 DIAGNOSIS — Z951 Presence of aortocoronary bypass graft: Secondary | ICD-10-CM | POA: Diagnosis not present

## 2022-09-24 DIAGNOSIS — R278 Other lack of coordination: Secondary | ICD-10-CM | POA: Diagnosis not present

## 2022-09-24 DIAGNOSIS — E7800 Pure hypercholesterolemia, unspecified: Secondary | ICD-10-CM | POA: Diagnosis not present

## 2022-09-24 DIAGNOSIS — Z48812 Encounter for surgical aftercare following surgery on the circulatory system: Secondary | ICD-10-CM | POA: Diagnosis not present

## 2022-09-24 DIAGNOSIS — R2689 Other abnormalities of gait and mobility: Secondary | ICD-10-CM | POA: Diagnosis not present

## 2022-09-24 DIAGNOSIS — I25110 Atherosclerotic heart disease of native coronary artery with unstable angina pectoris: Secondary | ICD-10-CM | POA: Diagnosis not present

## 2022-09-24 DIAGNOSIS — Z741 Need for assistance with personal care: Secondary | ICD-10-CM | POA: Diagnosis not present

## 2022-09-24 DIAGNOSIS — I2510 Atherosclerotic heart disease of native coronary artery without angina pectoris: Secondary | ICD-10-CM | POA: Diagnosis not present

## 2022-09-24 DIAGNOSIS — I4891 Unspecified atrial fibrillation: Secondary | ICD-10-CM | POA: Diagnosis not present

## 2022-09-25 DIAGNOSIS — Z951 Presence of aortocoronary bypass graft: Secondary | ICD-10-CM | POA: Diagnosis not present

## 2022-09-25 DIAGNOSIS — I4891 Unspecified atrial fibrillation: Secondary | ICD-10-CM | POA: Diagnosis not present

## 2022-09-25 DIAGNOSIS — R278 Other lack of coordination: Secondary | ICD-10-CM | POA: Diagnosis not present

## 2022-09-25 DIAGNOSIS — E7800 Pure hypercholesterolemia, unspecified: Secondary | ICD-10-CM | POA: Diagnosis not present

## 2022-09-25 DIAGNOSIS — Z48812 Encounter for surgical aftercare following surgery on the circulatory system: Secondary | ICD-10-CM | POA: Diagnosis not present

## 2022-09-25 DIAGNOSIS — I25110 Atherosclerotic heart disease of native coronary artery with unstable angina pectoris: Secondary | ICD-10-CM | POA: Diagnosis not present

## 2022-09-25 DIAGNOSIS — Z741 Need for assistance with personal care: Secondary | ICD-10-CM | POA: Diagnosis not present

## 2022-09-25 DIAGNOSIS — R2689 Other abnormalities of gait and mobility: Secondary | ICD-10-CM | POA: Diagnosis not present

## 2022-09-25 DIAGNOSIS — I2510 Atherosclerotic heart disease of native coronary artery without angina pectoris: Secondary | ICD-10-CM | POA: Diagnosis not present

## 2022-09-26 ENCOUNTER — Other Ambulatory Visit: Payer: Self-pay | Admitting: Cardiothoracic Surgery

## 2022-09-26 DIAGNOSIS — Z951 Presence of aortocoronary bypass graft: Secondary | ICD-10-CM

## 2022-09-26 NOTE — Progress Notes (Signed)
[address]       Lisa [name] [address]             [phone]       HPI: Patient returns for routine postoperative follow-up having undergone CABG x 3 on 08/26/2022. The patient's early postoperative recovery while in the hospital was notable for Atrial Fibrillation which converted to NSR with Amiodarone.  She was also deconditioned and discharged to SNF. Since hospital discharge the patient reports overall she is doing well.  She states she has noticed that if she does too much in a day that she is very tired the next day.  She is wanting to participate in cardiac rehab at [facility].  She is ambulating without significant difficulty.  Her surgical incisions are healing without evidence of infection.  Her weight has been stable.  Overall she is so happy to be hear and her experience with surgery and staff.   Current Outpatient Medications  Medication Sig Dispense Refill   acetaminophen (TYLENOL) 500 MG tablet Take 1-2 tablets (500-1,000 mg total) by mouth every 6 (six) hours as needed for mild pain or fever. 1000 mg in the morning and at night  Additional 1000 mg daily at mid day if needed. 30 tablet 0   alendronate (FOSAMAX) 70 MG tablet Take 70 mg by mouth once a week. Take with a full glass of water on an empty stomach.  Saturday Morning     amiodarone (PACERONE) 200 MG tablet Take 1 tablet (200 mg total) by mouth daily. 90 tablet 3   APPLE CIDER VINEGAR PO Take 1 tablet by mouth daily.     aspirin EC 325 MG tablet Take 1 tablet (325 mg total) by mouth daily.     atorvastatin (LIPITOR) 80 MG tablet Take 1 tablet (80 mg total) by mouth at bedtime.     Cholecalciferol (VITAMIN D-3) 125 MCG (5000 UT) TABS Take 5,000 Units by mouth daily.     Cobalamin Combinations (NEURIVA PLUS PO) Take 1 capsule by mouth daily.     fenofibrate (TRICOR) 145 MG tablet Take 145 mg by mouth daily.     ferrous sulfate 324 MG TBEC Take 324 mg by mouth every other day.     fluticasone  (FLONASE) 50 MCG/ACT nasal spray Place 2 sprays into the nose daily. Allergic rhinitis.     Magnesium 250 MG TABS Take 250 mg by mouth at bedtime.     metoprolol tartrate (LOPRESSOR) 25 MG tablet Take 0.5 tablets (12.5 mg total) by mouth 2 (two) times daily. 180 tablet 3   Multiple Vitamin (MULTIVITAMIN WITH MINERALS) TABS Take 1 tablet by mouth daily. Centrum  Woman 50+     Multiple Vitamins-Minerals (EMERGEN-C IMMUNE PLUS/VIT D PO) Take 2 tablets by mouth daily.     MYRBETRIQ 50 MG TB24 tablet Take 50 mg by mouth at bedtime.     ondansetron (ZOFRAN) 4 MG tablet Take 1 tablet (4 mg total) by mouth every 8 (eight) hours as needed for nausea or vomiting. 90 tablet 0   OVER THE COUNTER MEDICATION Take 1 tablet by mouth daily. Nervive     Polyethyl Glycol-Propyl Glycol (SYSTANE OP) Place 1 drop into both eyes daily as needed (Dry eyes).     traMADol (ULTRAM) 50 MG tablet Take 1 tablet (50 mg total) by mouth every 4 (four) hours. 30 tablet 0   TURMERIC PO Take 1,000 mg by mouth 2 (two) times daily.  No current facility-administered medications for this visit.    Physical Exam:  BP 128/64 (BP Location: Right Arm, Patient Position: Sitting, Cuff Size: Normal)   Pulse 70   Resp 20   Ht 5\' 2"  (1.575 m)   Wt 166 lb 14.4 oz (75.7 kg)   SpO2 100% Comment: RA  BMI 30.53 kg/m   Gen: NAD Heart: RRR Lungs: CTA Incisions: well healed  Diagnostic Tests:  CXR: cardiomegaly, trace left pleural effusion   A/P;  S/P CABG x 3 performed 9/5 by Dr. Prescott Gum Atrial Fibrillation- no evidence of recurrence since surgery, currently on Amiodarone 200 mg daily, can discontinue medication when current prescription runs out Cardiac rehab- you are encouraged to enroll and participate if you wish to do so, we are happy to fax referral to [facility], patient will contact us with appropriate fax number Activity- increase as tolerated, discussed sternal precautions and continued limitations at this  time Heart Healthy diet plan- I have provided patient handout and had brief discussion with heart healthy diet Dispo- patient looks fantastic.  Overall is doing very well, RTC prn  Ellwood Handler, PA-C [facility] [phone]

## 2022-09-26 NOTE — Patient Instructions (Signed)

## 2022-09-29 ENCOUNTER — Ambulatory Visit (INDEPENDENT_AMBULATORY_CARE_PROVIDER_SITE_OTHER): Payer: Self-pay | Admitting: Physician Assistant

## 2022-09-29 ENCOUNTER — Ambulatory Visit
Admission: RE | Admit: 2022-09-29 | Discharge: 2022-09-29 | Disposition: A | Discharge status: Home / Self Care | Payer: Medicare Other | Source: Ambulatory Visit | Attending: Cardiothoracic Surgery | Admitting: Cardiothoracic Surgery

## 2022-09-29 VITALS — BP 128/64 | HR 70 | Resp 20 | Ht 62.0 in | Wt 166.9 lb

## 2022-09-29 DIAGNOSIS — R278 Other lack of coordination: Secondary | ICD-10-CM | POA: Diagnosis not present

## 2022-09-29 DIAGNOSIS — Z48812 Encounter for surgical aftercare following surgery on the circulatory system: Secondary | ICD-10-CM | POA: Diagnosis not present

## 2022-09-29 DIAGNOSIS — Z951 Presence of aortocoronary bypass graft: Secondary | ICD-10-CM

## 2022-09-29 DIAGNOSIS — Z741 Need for assistance with personal care: Secondary | ICD-10-CM | POA: Diagnosis not present

## 2022-09-29 DIAGNOSIS — M47814 Spondylosis without myelopathy or radiculopathy, thoracic region: Secondary | ICD-10-CM | POA: Diagnosis not present

## 2022-09-29 DIAGNOSIS — I25110 Atherosclerotic heart disease of native coronary artery with unstable angina pectoris: Secondary | ICD-10-CM | POA: Diagnosis not present

## 2022-09-29 DIAGNOSIS — I4891 Unspecified atrial fibrillation: Secondary | ICD-10-CM | POA: Diagnosis not present

## 2022-09-29 DIAGNOSIS — J9811 Atelectasis: Secondary | ICD-10-CM | POA: Diagnosis not present

## 2022-09-29 DIAGNOSIS — I2510 Atherosclerotic heart disease of native coronary artery without angina pectoris: Secondary | ICD-10-CM | POA: Diagnosis not present

## 2022-09-29 DIAGNOSIS — E7800 Pure hypercholesterolemia, unspecified: Secondary | ICD-10-CM | POA: Diagnosis not present

## 2022-09-29 DIAGNOSIS — J90 Pleural effusion, not elsewhere classified: Secondary | ICD-10-CM | POA: Diagnosis not present

## 2022-09-29 DIAGNOSIS — R2689 Other abnormalities of gait and mobility: Secondary | ICD-10-CM | POA: Diagnosis not present

## 2022-09-29 DIAGNOSIS — I700 Atherosclerosis of aorta: Secondary | ICD-10-CM | POA: Diagnosis not present

## 2022-10-01 DIAGNOSIS — E7800 Pure hypercholesterolemia, unspecified: Secondary | ICD-10-CM | POA: Diagnosis not present

## 2022-10-01 DIAGNOSIS — I2510 Atherosclerotic heart disease of native coronary artery without angina pectoris: Secondary | ICD-10-CM | POA: Diagnosis not present

## 2022-10-01 DIAGNOSIS — I4891 Unspecified atrial fibrillation: Secondary | ICD-10-CM | POA: Diagnosis not present

## 2022-10-01 DIAGNOSIS — R2689 Other abnormalities of gait and mobility: Secondary | ICD-10-CM | POA: Diagnosis not present

## 2022-10-01 DIAGNOSIS — Z741 Need for assistance with personal care: Secondary | ICD-10-CM | POA: Diagnosis not present

## 2022-10-01 DIAGNOSIS — I25110 Atherosclerotic heart disease of native coronary artery with unstable angina pectoris: Secondary | ICD-10-CM | POA: Diagnosis not present

## 2022-10-01 DIAGNOSIS — Z48812 Encounter for surgical aftercare following surgery on the circulatory system: Secondary | ICD-10-CM | POA: Diagnosis not present

## 2022-10-01 DIAGNOSIS — Z951 Presence of aortocoronary bypass graft: Secondary | ICD-10-CM | POA: Diagnosis not present

## 2022-10-01 DIAGNOSIS — R278 Other lack of coordination: Secondary | ICD-10-CM | POA: Diagnosis not present

## 2022-10-06 DIAGNOSIS — E7800 Pure hypercholesterolemia, unspecified: Secondary | ICD-10-CM | POA: Diagnosis not present

## 2022-10-06 DIAGNOSIS — R278 Other lack of coordination: Secondary | ICD-10-CM | POA: Diagnosis not present

## 2022-10-06 DIAGNOSIS — I4891 Unspecified atrial fibrillation: Secondary | ICD-10-CM | POA: Diagnosis not present

## 2022-10-06 DIAGNOSIS — Z48812 Encounter for surgical aftercare following surgery on the circulatory system: Secondary | ICD-10-CM | POA: Diagnosis not present

## 2022-10-06 DIAGNOSIS — I2510 Atherosclerotic heart disease of native coronary artery without angina pectoris: Secondary | ICD-10-CM | POA: Diagnosis not present

## 2022-10-06 DIAGNOSIS — I25110 Atherosclerotic heart disease of native coronary artery with unstable angina pectoris: Secondary | ICD-10-CM | POA: Diagnosis not present

## 2022-10-06 DIAGNOSIS — R2689 Other abnormalities of gait and mobility: Secondary | ICD-10-CM | POA: Diagnosis not present

## 2022-10-06 DIAGNOSIS — Z741 Need for assistance with personal care: Secondary | ICD-10-CM | POA: Diagnosis not present

## 2022-10-06 DIAGNOSIS — Z951 Presence of aortocoronary bypass graft: Secondary | ICD-10-CM | POA: Diagnosis not present

## 2022-10-08 DIAGNOSIS — R278 Other lack of coordination: Secondary | ICD-10-CM | POA: Diagnosis not present

## 2022-10-08 DIAGNOSIS — E7800 Pure hypercholesterolemia, unspecified: Secondary | ICD-10-CM | POA: Diagnosis not present

## 2022-10-08 DIAGNOSIS — Z741 Need for assistance with personal care: Secondary | ICD-10-CM | POA: Diagnosis not present

## 2022-10-08 DIAGNOSIS — R2689 Other abnormalities of gait and mobility: Secondary | ICD-10-CM | POA: Diagnosis not present

## 2022-10-08 DIAGNOSIS — Z951 Presence of aortocoronary bypass graft: Secondary | ICD-10-CM | POA: Diagnosis not present

## 2022-10-08 DIAGNOSIS — I4891 Unspecified atrial fibrillation: Secondary | ICD-10-CM | POA: Diagnosis not present

## 2022-10-08 DIAGNOSIS — I2510 Atherosclerotic heart disease of native coronary artery without angina pectoris: Secondary | ICD-10-CM | POA: Diagnosis not present

## 2022-10-08 DIAGNOSIS — Z48812 Encounter for surgical aftercare following surgery on the circulatory system: Secondary | ICD-10-CM | POA: Diagnosis not present

## 2022-10-08 DIAGNOSIS — I25110 Atherosclerotic heart disease of native coronary artery with unstable angina pectoris: Secondary | ICD-10-CM | POA: Diagnosis not present

## 2022-10-15 DIAGNOSIS — I2510 Atherosclerotic heart disease of native coronary artery without angina pectoris: Secondary | ICD-10-CM | POA: Diagnosis not present

## 2022-10-15 DIAGNOSIS — R278 Other lack of coordination: Secondary | ICD-10-CM | POA: Diagnosis not present

## 2022-10-15 DIAGNOSIS — Z741 Need for assistance with personal care: Secondary | ICD-10-CM | POA: Diagnosis not present

## 2022-10-15 DIAGNOSIS — Z48812 Encounter for surgical aftercare following surgery on the circulatory system: Secondary | ICD-10-CM | POA: Diagnosis not present

## 2022-10-15 DIAGNOSIS — I4891 Unspecified atrial fibrillation: Secondary | ICD-10-CM | POA: Diagnosis not present

## 2022-10-15 DIAGNOSIS — R2689 Other abnormalities of gait and mobility: Secondary | ICD-10-CM | POA: Diagnosis not present

## 2022-10-15 DIAGNOSIS — Z951 Presence of aortocoronary bypass graft: Secondary | ICD-10-CM | POA: Diagnosis not present

## 2022-10-15 DIAGNOSIS — I25110 Atherosclerotic heart disease of native coronary artery with unstable angina pectoris: Secondary | ICD-10-CM | POA: Diagnosis not present

## 2022-10-15 DIAGNOSIS — E7800 Pure hypercholesterolemia, unspecified: Secondary | ICD-10-CM | POA: Diagnosis not present

## 2022-10-17 DIAGNOSIS — I4891 Unspecified atrial fibrillation: Secondary | ICD-10-CM | POA: Diagnosis not present

## 2022-10-17 DIAGNOSIS — I2510 Atherosclerotic heart disease of native coronary artery without angina pectoris: Secondary | ICD-10-CM | POA: Diagnosis not present

## 2022-10-17 DIAGNOSIS — Z48812 Encounter for surgical aftercare following surgery on the circulatory system: Secondary | ICD-10-CM | POA: Diagnosis not present

## 2022-10-17 DIAGNOSIS — G4733 Obstructive sleep apnea (adult) (pediatric): Secondary | ICD-10-CM | POA: Diagnosis not present

## 2022-10-17 DIAGNOSIS — I25110 Atherosclerotic heart disease of native coronary artery with unstable angina pectoris: Secondary | ICD-10-CM | POA: Diagnosis not present

## 2022-10-17 DIAGNOSIS — E7800 Pure hypercholesterolemia, unspecified: Secondary | ICD-10-CM | POA: Diagnosis not present

## 2022-10-17 DIAGNOSIS — R278 Other lack of coordination: Secondary | ICD-10-CM | POA: Diagnosis not present

## 2022-10-17 DIAGNOSIS — Z951 Presence of aortocoronary bypass graft: Secondary | ICD-10-CM | POA: Diagnosis not present

## 2022-10-17 DIAGNOSIS — Z741 Need for assistance with personal care: Secondary | ICD-10-CM | POA: Diagnosis not present

## 2022-10-17 DIAGNOSIS — R2689 Other abnormalities of gait and mobility: Secondary | ICD-10-CM | POA: Diagnosis not present

## 2022-10-20 DIAGNOSIS — E7800 Pure hypercholesterolemia, unspecified: Secondary | ICD-10-CM | POA: Diagnosis not present

## 2022-10-20 DIAGNOSIS — Z48812 Encounter for surgical aftercare following surgery on the circulatory system: Secondary | ICD-10-CM | POA: Diagnosis not present

## 2022-10-20 DIAGNOSIS — R278 Other lack of coordination: Secondary | ICD-10-CM | POA: Diagnosis not present

## 2022-10-20 DIAGNOSIS — I2510 Atherosclerotic heart disease of native coronary artery without angina pectoris: Secondary | ICD-10-CM | POA: Diagnosis not present

## 2022-10-20 DIAGNOSIS — R2689 Other abnormalities of gait and mobility: Secondary | ICD-10-CM | POA: Diagnosis not present

## 2022-10-20 DIAGNOSIS — Z951 Presence of aortocoronary bypass graft: Secondary | ICD-10-CM | POA: Diagnosis not present

## 2022-10-20 DIAGNOSIS — I25110 Atherosclerotic heart disease of native coronary artery with unstable angina pectoris: Secondary | ICD-10-CM | POA: Diagnosis not present

## 2022-10-20 DIAGNOSIS — I4891 Unspecified atrial fibrillation: Secondary | ICD-10-CM | POA: Diagnosis not present

## 2022-10-20 DIAGNOSIS — Z741 Need for assistance with personal care: Secondary | ICD-10-CM | POA: Diagnosis not present

## 2022-10-22 DIAGNOSIS — Z741 Need for assistance with personal care: Secondary | ICD-10-CM | POA: Diagnosis not present

## 2022-10-22 DIAGNOSIS — Z48812 Encounter for surgical aftercare following surgery on the circulatory system: Secondary | ICD-10-CM | POA: Diagnosis not present

## 2022-10-22 DIAGNOSIS — R2689 Other abnormalities of gait and mobility: Secondary | ICD-10-CM | POA: Diagnosis not present

## 2022-10-22 DIAGNOSIS — I25110 Atherosclerotic heart disease of native coronary artery with unstable angina pectoris: Secondary | ICD-10-CM | POA: Diagnosis not present

## 2022-10-22 DIAGNOSIS — Z951 Presence of aortocoronary bypass graft: Secondary | ICD-10-CM | POA: Diagnosis not present

## 2022-10-22 DIAGNOSIS — R278 Other lack of coordination: Secondary | ICD-10-CM | POA: Diagnosis not present

## 2022-10-22 DIAGNOSIS — E7800 Pure hypercholesterolemia, unspecified: Secondary | ICD-10-CM | POA: Diagnosis not present

## 2022-10-22 DIAGNOSIS — I4891 Unspecified atrial fibrillation: Secondary | ICD-10-CM | POA: Diagnosis not present

## 2022-10-22 DIAGNOSIS — I2510 Atherosclerotic heart disease of native coronary artery without angina pectoris: Secondary | ICD-10-CM | POA: Diagnosis not present

## 2022-10-23 DIAGNOSIS — E7800 Pure hypercholesterolemia, unspecified: Secondary | ICD-10-CM | POA: Diagnosis not present

## 2022-10-23 DIAGNOSIS — Z951 Presence of aortocoronary bypass graft: Secondary | ICD-10-CM | POA: Diagnosis not present

## 2022-10-23 DIAGNOSIS — R278 Other lack of coordination: Secondary | ICD-10-CM | POA: Diagnosis not present

## 2022-10-23 DIAGNOSIS — I2510 Atherosclerotic heart disease of native coronary artery without angina pectoris: Secondary | ICD-10-CM | POA: Diagnosis not present

## 2022-10-23 DIAGNOSIS — R2689 Other abnormalities of gait and mobility: Secondary | ICD-10-CM | POA: Diagnosis not present

## 2022-10-23 DIAGNOSIS — I4891 Unspecified atrial fibrillation: Secondary | ICD-10-CM | POA: Diagnosis not present

## 2022-10-23 DIAGNOSIS — Z48812 Encounter for surgical aftercare following surgery on the circulatory system: Secondary | ICD-10-CM | POA: Diagnosis not present

## 2022-10-23 DIAGNOSIS — I25110 Atherosclerotic heart disease of native coronary artery with unstable angina pectoris: Secondary | ICD-10-CM | POA: Diagnosis not present

## 2022-10-23 DIAGNOSIS — Z741 Need for assistance with personal care: Secondary | ICD-10-CM | POA: Diagnosis not present

## 2022-10-24 ENCOUNTER — Ambulatory Visit (INDEPENDENT_AMBULATORY_CARE_PROVIDER_SITE_OTHER): Payer: Medicare Other

## 2022-10-24 ENCOUNTER — Ambulatory Visit: Payer: Medicare Other | Attending: Cardiology | Admitting: Cardiology

## 2022-10-24 ENCOUNTER — Encounter: Payer: Self-pay | Admitting: Cardiology

## 2022-10-24 VITALS — BP 128/58 | HR 55 | Ht 63.0 in | Wt 171.6 lb

## 2022-10-24 DIAGNOSIS — I4891 Unspecified atrial fibrillation: Secondary | ICD-10-CM | POA: Diagnosis not present

## 2022-10-24 DIAGNOSIS — I10 Essential (primary) hypertension: Secondary | ICD-10-CM | POA: Diagnosis not present

## 2022-10-24 DIAGNOSIS — I9789 Other postprocedural complications and disorders of the circulatory system, not elsewhere classified: Secondary | ICD-10-CM

## 2022-10-24 DIAGNOSIS — Z951 Presence of aortocoronary bypass graft: Secondary | ICD-10-CM

## 2022-10-24 MED ORDER — ASPIRIN 81 MG PO TBEC: 81.0000 mg | DELAYED_RELEASE_TABLET | Freq: Every day | ORAL | Status: AC

## 2022-10-24 NOTE — Progress Notes (Signed)
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  Previously seen by myself with symptoms of chest pain, coronary CTA showed significant disease, left heart cath showed significant three-vessel disease.  Patient underwent coronary artery bypass graft successfully.  Noted to have postoperative atrial fibrillation, placed on amiodarone.  Echo 08/2022 showed normal systolic function EF 55 to 60%, aortic valve sclerosis.  History of fatigue and shortness of breath after contracting an RSV infection.  Currently undergoing cardiac rehab successfully at [facility].  Has some soreness on her chest but surgical wound appears to be healing normally.   Prior notes Echocardiogram 10/2020 EF 55 to 60%, Cardiac monitor 12/2020 1 run of paroxysmal SVT, no A-fib or flutter noted  Past Medical History:  Diagnosis Date   Acne rosacea    Allergic rhinitis    Chronic kidney disease    kidney stones, Nov. 2013- tx with Lithotripsy, still has some present    Cough variant asthma    Degenerative disc disease    knees, ankles, back- OA   GERD (gastroesophageal reflux disease)    Headache(784.0)    since stopping aleve- 07/30/2013   Hyperlipemia    dyslipidemia   Hypertension    [facility] cardiac pharmacist follows pt. [name] ,has never taken anti-hypertensive   Osteoarthritis    Plantar fasciitis    (using orthotics-they are wearing out)- Dr Carrolyn Leigh   Pneumonia    double  pneumonia - relative to sinus infection , had chemical cauterization    PONV (postoperative nausea and vomiting)    urgency, N&V for days, memory & processing   Sleep apnea    Bringing cpap mask and tubing, study last done- 2006    Past Surgical History:  Procedure Laterality Date   ABDOMINAL HYSTERECTOMY  1990's   BLADDER REPAIR     BLADDER SUSPENSION  90's   CORONARY ARTERY BYPASS GRAFT N/A 08/26/2022   Procedure: CORONARY ARTERY BYPASS GRAFTING (CABG) X THREE, USING LEFT INTERNAL MAMMARY ARTERY AND RIGHT LEG GREATER SAPHENOUS VEIN HARVESTED ENDOSCOPICALLY;  Surgeon: Dahlia Byes, MD;  Location: [place];  Service: Open Heart Surgery;  Laterality: N/A;   LEFT HEART CATH AND CORONARY ANGIOGRAPHY N/A 08/20/2022   Procedure: LEFT HEART CATH AND CORONARY ANGIOGRAPHY;  Surgeon: Wellington Hampshire, MD;  Location: [facility];  Service: Cardiovascular;  Laterality: N/A;   MAXIMUM ACCESS (MAS)POSTERIOR LUMBAR INTERBODY FUSION (PLIF) 2 LEVEL N/A 08/12/2013   Procedure: FOR MAXIMUM ACCESS (MAS) POSTERIOR LUMBAR INTERBODY FUSION (PLIF) 2 LEVEL;  Surgeon: Eustace Moore, MD;  Location: [facility];  Service: Neurosurgery;  Laterality: N/A;  FOR MAXIMUM ACCESS (MAS) POSTERIOR LUMBAR INTERBODY FUSION (PLIF) 2 LEVEL   MULTIPLE TOOTH EXTRACTIONS     wisdom teeth extractions    POSTERIOR FUSION LUMBAR SPINE  08/12/2013   Dr Ronnald Ramp   TEE WITHOUT CARDIOVERSION N/A 08/26/2022  Procedure: TRANSESOPHAGEAL ECHOCARDIOGRAM (TEE);  Surgeon: Dahlia Byes, MD;  Location: [facility];  Service: Open Heart Surgery;  Laterality: N/A;   TONSILLECTOMY      Current Medications: Current Meds  Medication Sig   acetaminophen (TYLENOL) 500 MG tablet Take 1-2 tablets (500-1,000 mg total) by mouth every 6 (six) hours as needed for mild pain or fever. 1000 mg in the morning and at night  Additional 1000 mg daily at mid day if needed.   alendronate (FOSAMAX) 70 MG tablet Take 70 mg by mouth once a week. Take with a full glass  of water on an empty stomach.  Saturday Morning   atorvastatin (LIPITOR) 80 MG tablet Take 1 tablet (80 mg total) by mouth at bedtime.   Cholecalciferol (VITAMIN D-3) 125 MCG (5000 UT) TABS Take 5,000 Units by mouth daily.   Cobalamin Combinations (NEURIVA PLUS PO) Take 1 capsule by mouth daily.   fenofibrate (TRICOR) 145 MG tablet Take 145 mg by mouth daily.   ferrous sulfate 324 MG TBEC Take 324 mg by mouth every other day.   fluticasone (FLONASE) 50 MCG/ACT nasal spray Place 2 sprays into the nose daily. Allergic rhinitis.   Magnesium 250 MG TABS Take 250 mg by mouth at bedtime.   metoprolol tartrate (LOPRESSOR) 25 MG tablet Take 0.5 tablets (12.5 mg total) by mouth 2 (two) times daily.   Multiple Vitamin (MULTIVITAMIN WITH MINERALS) TABS Take 1 tablet by mouth daily. Centrum  Woman 50+   Multiple Vitamins-Minerals (EMERGEN-C IMMUNE PLUS/VIT D PO) Take 2 tablets by mouth daily.   MYRBETRIQ 50 MG TB24 tablet Take 50 mg by mouth at bedtime.   OVER THE COUNTER MEDICATION Take 1 tablet by mouth daily. Nervive   Polyethyl Glycol-Propyl Glycol (SYSTANE OP) Place 1 drop into both eyes daily as needed (Dry eyes).   TURMERIC PO Take 1,000 mg by mouth 2 (two) times daily.   [DISCONTINUED] amiodarone (PACERONE) 200 MG tablet Take 1 tablet (200 mg total) by mouth daily.   [DISCONTINUED] aspirin EC 325 MG tablet Take 1 tablet (325 mg total) by mouth daily.     Allergies:   Other, Oxycodone, Sulfa antibiotics, and Sulfasalazine   Social History   Socioeconomic History   Marital status: Divorced    Spouse name: Not on file   Number of children: Not on file   Years of education: Not on file   Highest education level: Not on file  Occupational History   Occupation: retired  Tobacco Use   Smoking status: Never   Smokeless tobacco: Never  Substance and Sexual Activity   Alcohol use: Yes    Comment: on occassion   Drug use: No   Sexual activity: Not on file  Other Topics Concern   Not on file   Social History Narrative   Lives alone   Left Handed   Drinks 2-3 cups caffeine daily   Social Determinants of Health   Financial Resource Strain: Not on file  Food Insecurity: No Food Insecurity (08/28/2022)   Hunger Vital Sign    Worried About Running Out of Food in the Last Year: Never true    Ran Out of Food in the Last Year: Never true  Transportation Needs: No Transportation Needs (08/28/2022)   PRAPARE - Hydrologist (Medical): No    Lack of Transportation (Non-Medical): No  Physical Activity: Not on file  Stress: Not on file  Social Connections: Not on file     Family History: The  patient's family history includes CVA in her father; Heart Problems in her father and mother; Heart attack in her father; Heart disease in her father.  ROS:   Please see the history of present illness.     All other systems reviewed and are negative.  EKGs/Labs/Other Studies Reviewed:    The following studies were reviewed today:   EKG:  EKG is  ordered today.  The ekg ordered today demonstrates sinus bradycardia, otherwise normal ECG  Recent Labs: 08/19/2022: ALT 19 08/30/2022: Hemoglobin 8.7; Platelets 164; TSH 3.164 08/31/2022: Magnesium 2.4 09/01/2022: BUN 38; Creatinine, Ser 1.32; Potassium 4.1; Sodium 139  Recent Lipid Panel    Component Value Date/Time   CHOL 171 08/19/2022 0957   TRIG 160 (H) 08/19/2022 0957   HDL 67 08/19/2022 0957   CHOLHDL 2.6 08/19/2022 0957   VLDL 32 08/19/2022 0957   LDLCALC 72 08/19/2022 0957   LDLDIRECT 92 08/19/2022 0957     Risk Assessment/Calculations:      Physical Exam:    VS:  BP (!) 128/58 (BP Location: Left Arm, Patient Position: Sitting, Cuff Size: Normal)   Pulse (!) 55   Ht 5\' 3"  (1.6 m)   Wt 171 lb 9.6 oz (77.8 kg)   SpO2 98%   BMI 30.40 kg/m     Wt Readings from Last 3 Encounters:  10/24/22 171 lb 9.6 oz (77.8 kg)  09/29/22 166 lb 14.4 oz (75.7 kg)  09/15/22 169 lb 2 oz (76.7 kg)     GEN:  Well  nourished, well developed in no acute distress HEENT: Normal NECK: No JVD; No carotid bruits CARDIAC: RRR, 2/6 systolic murmur RESPIRATORY:  Clear to auscultation without rales, wheezing or rhonchi  ABDOMEN: Soft, non-tender, non-distended MUSCULOSKELETAL:  No edema; sternal surgical scar noted SKIN: Warm and dry NEUROLOGIC:  Alert and oriented x 3 PSYCHIATRIC:  Normal affect   ASSESSMENT:    1. S/P CABG x 3   2. Primary hypertension   3. Postoperative atrial fibrillation (HCC)    PLAN:    In order of problems listed above:  CAD/CABG x3.  Reduce aspirin 81 mg daily, continue Lipitor 80.  Surgical scar healing normally, echo with normal EF.  Continue cardiac rehab. Hypertension, BP controlled.  Continue Lopressor. Postoperative A-fib.  Stop amiodarone, place cardiac monitor to evaluate any atrial fibrillation recurrence.  Follow-up in 6 weeks.   Medication Adjustments/Labs and Tests Ordered: Current medicines are reviewed at length with the patient today.  Concerns regarding medicines are outlined above.  Orders Placed This Encounter  Procedures   LONG TERM MONITOR (3-14 DAYS)   EKG 12-Lead   Meds ordered this encounter  Medications   aspirin EC 81 MG tablet    Sig: Take 1 tablet (81 mg total) by mouth daily.    Patient Instructions  Medication Instructions:   Your physician has recommended you make the following change in your medication:    STOP taking your Amiodarone.  2.    REDUCE your Aspirin to 81 MG once a day.   *If you need a refill on your cardiac medications before your next appointment, please call your pharmacy*   Testing/Procedures:  Your physician has recommended that you wear a Zio XT monitor for 2 weeks. This will be mailed to your home address in 4-5 business days.   Your clinician has requested a Zio heart rhythm monitor by i[employer] to be mailed to your home for you to wear for 14 days. You should expect a small box  to arrive via USPS (or  FedEx in some cases) within this next week. If you do not receive it please call [employer] at [phone].  Closely watching your heart at this time will help your care team understand more and provide information needed to develop your plan of care.  Please apply your Zio patch monitor the day you receive it. Keep this packaging, you will use this to return your Zio monitor.  You will easily be able to apply the monitor with the instructions provided in the Patient Guide.  If you need assistance, [employer] representatives are available 24/7 at [phone].  You can also download the [facility] app on your phone to view detailed application instructions and log symptoms.  After you wear your monitor for 14 days, place it back in the blue box or envelope, along with your Symptom Log.  To send your monitor back: Simply use the pre-addressed and pre-paid box/envelope.  Send it back through [employer] the same day you remove it via your local post office or by placing it in your mailbox.  As soon as we receive the results, they will be reviewed and your clinician will contact you.  For the first 24 hours- it is essential to not shower or exercise, to allow the patch to adhere to your skin. Avoid excessive sweating to help maximize wear time. Do not submerge the device, no hot tubs, and no swimming pools. Keep any lotions or oils away from the patch. After 24 hours you may shower with the patch on. Take brief showers with your back facing the shower head.  Do not remove patch once it has been placed because that will interrupt data and decrease adhesive wear time. Push the button when you have any symptoms and write down what you were feeling. Once you have completed wearing your monitor, remove and place into box which has postage paid and place in your outgoing mailbox.  If for some reason you have misplaced your box then call our office and we can provide another box and/or mail it off for  you.    Follow-Up: At [facility], you and your health needs are our priority.  As part of our continuing mission to provide you with exceptional heart care, we have created designated Provider Care Teams.  These Care Teams include your primary Cardiologist (physician) and Advanced Practice Providers (APPs -  Physician Assistants and Nurse Practitioners) who all work together to provide you with the care you need, when you need it.  We recommend signing up for the patient portal called "MyChart".  Sign up information is provided on this After Visit Summary.  MyChart is used to connect with patients for Virtual Visits (Telemedicine).  Patients are able to view lab/test results, encounter notes, upcoming appointments, etc.  Non-urgent messages can be sent to your provider as well.   To learn more about what you can do with MyChart, go to [email or web address].    Your next appointment:   6 week(s)  The format for your next appointment:   In Person  Provider:    ONLY WITH Kate Sable, MD    Other Instructions   Important Information About Sugar         Signed, Kate Sable, MD  10/24/2022 11:51 AM    [place]

## 2022-10-24 NOTE — Patient Instructions (Signed)
Medication Instructions:   Your physician has recommended you make the following change in your medication:    STOP taking your Amiodarone.  2.    REDUCE your Aspirin to 81 MG once a day.   *If you need a refill on your cardiac medications before your next appointment, please call your pharmacy*   Testing/Procedures:  Your physician has recommended that you wear a Zio XT monitor for 2 weeks. This will be mailed to your home address in 4-5 business days.   Your clinician has requested a Zio heart rhythm monitor by [employer] to be mailed to your home for you to wear for 14 days. You should expect a small box to arrive via USPS (or FedEx in some cases) within this next week. If you do not receive it please call [employer] at [phone].  Closely watching your heart at this time will help your care team understand more and provide information needed to develop your plan of care.  Please apply your Zio patch monitor the day you receive it. Keep this packaging, you will use this to return your Zio monitor.  You will easily be able to apply the monitor with the instructions provided in the Patient Guide.  If you need assistance, [employer] representatives are available 24/7 at [phone].  You can also download the [facility] app on your phone to view detailed application instructions and log symptoms.  After you wear your monitor for 14 days, place it back in the blue box or envelope, along with your Symptom Log.  To send your monitor back: Simply use the pre-addressed and pre-paid box/envelope.  Send it back through [employer] the same day you remove it via your local post office or by placing it in your mailbox.  As soon as we receive the results, they will be reviewed and your clinician will contact you.  For the first 24 hours- it is essential to not shower or exercise, to allow the patch to adhere to your skin. Avoid excessive sweating to help maximize wear time. Do not submerge the  device, no hot tubs, and no swimming pools. Keep any lotions or oils away from the patch. After 24 hours you may shower with the patch on. Take brief showers with your back facing the shower head.  Do not remove patch once it has been placed because that will interrupt data and decrease adhesive wear time. Push the button when you have any symptoms and write down what you were feeling. Once you have completed wearing your monitor, remove and place into box which has postage paid and place in your outgoing mailbox.  If for some reason you have misplaced your box then call our office and we can provide another box and/or mail it off for you.    Follow-Up: At [facility], you and your health needs are our priority.  As part of our continuing mission to provide you with exceptional heart care, we have created designated Provider Care Teams.  These Care Teams include your primary Cardiologist (physician) and Advanced Practice Providers (APPs -  Physician Assistants and Nurse Practitioners) who all work together to provide you with the care you need, when you need it.  We recommend signing up for the patient portal called "MyChart".  Sign up information is provided on this After Visit Summary.  MyChart is used to connect with patients for Virtual Visits (Telemedicine).  Patients are able to view lab/test results, encounter notes, upcoming appointments, etc.  Non-urgent messages can  be sent to your provider as well.   To learn more about what you can do with MyChart, go to [email or web address].    Your next appointment:   6 week(s)  The format for your next appointment:   In Person  Provider:    ONLY WITH Kate Sable, MD    Other Instructions   Important Information About Sugar

## 2022-10-27 DIAGNOSIS — Z48812 Encounter for surgical aftercare following surgery on the circulatory system: Secondary | ICD-10-CM | POA: Diagnosis not present

## 2022-10-27 DIAGNOSIS — E7800 Pure hypercholesterolemia, unspecified: Secondary | ICD-10-CM | POA: Diagnosis not present

## 2022-10-27 DIAGNOSIS — I25110 Atherosclerotic heart disease of native coronary artery with unstable angina pectoris: Secondary | ICD-10-CM | POA: Diagnosis not present

## 2022-10-27 DIAGNOSIS — R278 Other lack of coordination: Secondary | ICD-10-CM | POA: Diagnosis not present

## 2022-10-27 DIAGNOSIS — I4891 Unspecified atrial fibrillation: Secondary | ICD-10-CM | POA: Diagnosis not present

## 2022-10-27 DIAGNOSIS — I2510 Atherosclerotic heart disease of native coronary artery without angina pectoris: Secondary | ICD-10-CM | POA: Diagnosis not present

## 2022-10-27 DIAGNOSIS — Z741 Need for assistance with personal care: Secondary | ICD-10-CM | POA: Diagnosis not present

## 2022-10-27 DIAGNOSIS — R2689 Other abnormalities of gait and mobility: Secondary | ICD-10-CM | POA: Diagnosis not present

## 2022-10-27 DIAGNOSIS — Z951 Presence of aortocoronary bypass graft: Secondary | ICD-10-CM | POA: Diagnosis not present

## 2022-10-28 DIAGNOSIS — F5101 Primary insomnia: Secondary | ICD-10-CM | POA: Diagnosis not present

## 2022-10-28 DIAGNOSIS — Z951 Presence of aortocoronary bypass graft: Secondary | ICD-10-CM | POA: Diagnosis not present

## 2022-10-28 DIAGNOSIS — I2510 Atherosclerotic heart disease of native coronary artery without angina pectoris: Secondary | ICD-10-CM | POA: Diagnosis not present

## 2022-10-28 DIAGNOSIS — K5901 Slow transit constipation: Secondary | ICD-10-CM | POA: Diagnosis not present

## 2022-10-29 DIAGNOSIS — I2510 Atherosclerotic heart disease of native coronary artery without angina pectoris: Secondary | ICD-10-CM | POA: Diagnosis not present

## 2022-10-29 DIAGNOSIS — R278 Other lack of coordination: Secondary | ICD-10-CM | POA: Diagnosis not present

## 2022-10-29 DIAGNOSIS — E7800 Pure hypercholesterolemia, unspecified: Secondary | ICD-10-CM | POA: Diagnosis not present

## 2022-10-29 DIAGNOSIS — R2689 Other abnormalities of gait and mobility: Secondary | ICD-10-CM | POA: Diagnosis not present

## 2022-10-29 DIAGNOSIS — I4891 Unspecified atrial fibrillation: Secondary | ICD-10-CM | POA: Diagnosis not present

## 2022-10-29 DIAGNOSIS — Z48812 Encounter for surgical aftercare following surgery on the circulatory system: Secondary | ICD-10-CM | POA: Diagnosis not present

## 2022-10-29 DIAGNOSIS — Z951 Presence of aortocoronary bypass graft: Secondary | ICD-10-CM | POA: Diagnosis not present

## 2022-10-29 DIAGNOSIS — Z741 Need for assistance with personal care: Secondary | ICD-10-CM | POA: Diagnosis not present

## 2022-10-29 DIAGNOSIS — I25110 Atherosclerotic heart disease of native coronary artery with unstable angina pectoris: Secondary | ICD-10-CM | POA: Diagnosis not present

## 2022-10-31 DIAGNOSIS — I9789 Other postprocedural complications and disorders of the circulatory system, not elsewhere classified: Secondary | ICD-10-CM

## 2022-10-31 DIAGNOSIS — I4891 Unspecified atrial fibrillation: Secondary | ICD-10-CM | POA: Diagnosis not present

## 2022-10-31 DIAGNOSIS — R2689 Other abnormalities of gait and mobility: Secondary | ICD-10-CM | POA: Diagnosis not present

## 2022-10-31 DIAGNOSIS — Z951 Presence of aortocoronary bypass graft: Secondary | ICD-10-CM | POA: Diagnosis not present

## 2022-10-31 DIAGNOSIS — I2510 Atherosclerotic heart disease of native coronary artery without angina pectoris: Secondary | ICD-10-CM | POA: Diagnosis not present

## 2022-10-31 DIAGNOSIS — E7800 Pure hypercholesterolemia, unspecified: Secondary | ICD-10-CM | POA: Diagnosis not present

## 2022-10-31 DIAGNOSIS — R278 Other lack of coordination: Secondary | ICD-10-CM | POA: Diagnosis not present

## 2022-10-31 DIAGNOSIS — I25110 Atherosclerotic heart disease of native coronary artery with unstable angina pectoris: Secondary | ICD-10-CM | POA: Diagnosis not present

## 2022-10-31 DIAGNOSIS — Z741 Need for assistance with personal care: Secondary | ICD-10-CM | POA: Diagnosis not present

## 2022-10-31 DIAGNOSIS — Z48812 Encounter for surgical aftercare following surgery on the circulatory system: Secondary | ICD-10-CM | POA: Diagnosis not present

## 2022-11-03 DIAGNOSIS — R2689 Other abnormalities of gait and mobility: Secondary | ICD-10-CM | POA: Diagnosis not present

## 2022-11-03 DIAGNOSIS — I4891 Unspecified atrial fibrillation: Secondary | ICD-10-CM | POA: Diagnosis not present

## 2022-11-03 DIAGNOSIS — I2510 Atherosclerotic heart disease of native coronary artery without angina pectoris: Secondary | ICD-10-CM | POA: Diagnosis not present

## 2022-11-03 DIAGNOSIS — I25110 Atherosclerotic heart disease of native coronary artery with unstable angina pectoris: Secondary | ICD-10-CM | POA: Diagnosis not present

## 2022-11-03 DIAGNOSIS — Z48812 Encounter for surgical aftercare following surgery on the circulatory system: Secondary | ICD-10-CM | POA: Diagnosis not present

## 2022-11-03 DIAGNOSIS — R278 Other lack of coordination: Secondary | ICD-10-CM | POA: Diagnosis not present

## 2022-11-03 DIAGNOSIS — Z951 Presence of aortocoronary bypass graft: Secondary | ICD-10-CM | POA: Diagnosis not present

## 2022-11-03 DIAGNOSIS — E7800 Pure hypercholesterolemia, unspecified: Secondary | ICD-10-CM | POA: Diagnosis not present

## 2022-11-03 DIAGNOSIS — Z741 Need for assistance with personal care: Secondary | ICD-10-CM | POA: Diagnosis not present

## 2022-11-05 DIAGNOSIS — Z951 Presence of aortocoronary bypass graft: Secondary | ICD-10-CM | POA: Diagnosis not present

## 2022-11-05 DIAGNOSIS — R2689 Other abnormalities of gait and mobility: Secondary | ICD-10-CM | POA: Diagnosis not present

## 2022-11-05 DIAGNOSIS — R278 Other lack of coordination: Secondary | ICD-10-CM | POA: Diagnosis not present

## 2022-11-05 DIAGNOSIS — I2510 Atherosclerotic heart disease of native coronary artery without angina pectoris: Secondary | ICD-10-CM | POA: Diagnosis not present

## 2022-11-05 DIAGNOSIS — E7800 Pure hypercholesterolemia, unspecified: Secondary | ICD-10-CM | POA: Diagnosis not present

## 2022-11-05 DIAGNOSIS — Z48812 Encounter for surgical aftercare following surgery on the circulatory system: Secondary | ICD-10-CM | POA: Diagnosis not present

## 2022-11-05 DIAGNOSIS — I4891 Unspecified atrial fibrillation: Secondary | ICD-10-CM | POA: Diagnosis not present

## 2022-11-05 DIAGNOSIS — Z741 Need for assistance with personal care: Secondary | ICD-10-CM | POA: Diagnosis not present

## 2022-11-05 DIAGNOSIS — I25110 Atherosclerotic heart disease of native coronary artery with unstable angina pectoris: Secondary | ICD-10-CM | POA: Diagnosis not present

## 2022-11-10 DIAGNOSIS — E7800 Pure hypercholesterolemia, unspecified: Secondary | ICD-10-CM | POA: Diagnosis not present

## 2022-11-10 DIAGNOSIS — Z741 Need for assistance with personal care: Secondary | ICD-10-CM | POA: Diagnosis not present

## 2022-11-10 DIAGNOSIS — I2510 Atherosclerotic heart disease of native coronary artery without angina pectoris: Secondary | ICD-10-CM | POA: Diagnosis not present

## 2022-11-10 DIAGNOSIS — I4891 Unspecified atrial fibrillation: Secondary | ICD-10-CM | POA: Diagnosis not present

## 2022-11-10 DIAGNOSIS — Z48812 Encounter for surgical aftercare following surgery on the circulatory system: Secondary | ICD-10-CM | POA: Diagnosis not present

## 2022-11-10 DIAGNOSIS — I25110 Atherosclerotic heart disease of native coronary artery with unstable angina pectoris: Secondary | ICD-10-CM | POA: Diagnosis not present

## 2022-11-10 DIAGNOSIS — Z951 Presence of aortocoronary bypass graft: Secondary | ICD-10-CM | POA: Diagnosis not present

## 2022-11-10 DIAGNOSIS — R278 Other lack of coordination: Secondary | ICD-10-CM | POA: Diagnosis not present

## 2022-11-10 DIAGNOSIS — R2689 Other abnormalities of gait and mobility: Secondary | ICD-10-CM | POA: Diagnosis not present

## 2022-11-17 DIAGNOSIS — Z951 Presence of aortocoronary bypass graft: Secondary | ICD-10-CM | POA: Diagnosis not present

## 2022-11-17 DIAGNOSIS — E7800 Pure hypercholesterolemia, unspecified: Secondary | ICD-10-CM | POA: Diagnosis not present

## 2022-11-17 DIAGNOSIS — I2510 Atherosclerotic heart disease of native coronary artery without angina pectoris: Secondary | ICD-10-CM | POA: Diagnosis not present

## 2022-11-17 DIAGNOSIS — R278 Other lack of coordination: Secondary | ICD-10-CM | POA: Diagnosis not present

## 2022-11-17 DIAGNOSIS — Z48812 Encounter for surgical aftercare following surgery on the circulatory system: Secondary | ICD-10-CM | POA: Diagnosis not present

## 2022-11-17 DIAGNOSIS — I4891 Unspecified atrial fibrillation: Secondary | ICD-10-CM | POA: Diagnosis not present

## 2022-11-17 DIAGNOSIS — I25110 Atherosclerotic heart disease of native coronary artery with unstable angina pectoris: Secondary | ICD-10-CM | POA: Diagnosis not present

## 2022-11-17 DIAGNOSIS — R2689 Other abnormalities of gait and mobility: Secondary | ICD-10-CM | POA: Diagnosis not present

## 2022-11-17 DIAGNOSIS — Z741 Need for assistance with personal care: Secondary | ICD-10-CM | POA: Diagnosis not present

## 2022-11-19 DIAGNOSIS — I4891 Unspecified atrial fibrillation: Secondary | ICD-10-CM | POA: Diagnosis not present

## 2022-11-19 DIAGNOSIS — Z951 Presence of aortocoronary bypass graft: Secondary | ICD-10-CM | POA: Diagnosis not present

## 2022-11-19 DIAGNOSIS — E7800 Pure hypercholesterolemia, unspecified: Secondary | ICD-10-CM | POA: Diagnosis not present

## 2022-11-19 DIAGNOSIS — I2510 Atherosclerotic heart disease of native coronary artery without angina pectoris: Secondary | ICD-10-CM | POA: Diagnosis not present

## 2022-11-19 DIAGNOSIS — Z48812 Encounter for surgical aftercare following surgery on the circulatory system: Secondary | ICD-10-CM | POA: Diagnosis not present

## 2022-11-19 DIAGNOSIS — R278 Other lack of coordination: Secondary | ICD-10-CM | POA: Diagnosis not present

## 2022-11-19 DIAGNOSIS — Z741 Need for assistance with personal care: Secondary | ICD-10-CM | POA: Diagnosis not present

## 2022-11-19 DIAGNOSIS — R2689 Other abnormalities of gait and mobility: Secondary | ICD-10-CM | POA: Diagnosis not present

## 2022-11-19 DIAGNOSIS — I25110 Atherosclerotic heart disease of native coronary artery with unstable angina pectoris: Secondary | ICD-10-CM | POA: Diagnosis not present

## 2022-11-21 DIAGNOSIS — I25110 Atherosclerotic heart disease of native coronary artery with unstable angina pectoris: Secondary | ICD-10-CM | POA: Diagnosis not present

## 2022-11-21 DIAGNOSIS — I2510 Atherosclerotic heart disease of native coronary artery without angina pectoris: Secondary | ICD-10-CM | POA: Diagnosis not present

## 2022-11-21 DIAGNOSIS — R278 Other lack of coordination: Secondary | ICD-10-CM | POA: Diagnosis not present

## 2022-11-21 DIAGNOSIS — Z741 Need for assistance with personal care: Secondary | ICD-10-CM | POA: Diagnosis not present

## 2022-11-21 DIAGNOSIS — Z951 Presence of aortocoronary bypass graft: Secondary | ICD-10-CM | POA: Diagnosis not present

## 2022-11-21 DIAGNOSIS — E7800 Pure hypercholesterolemia, unspecified: Secondary | ICD-10-CM | POA: Diagnosis not present

## 2022-11-21 DIAGNOSIS — I4891 Unspecified atrial fibrillation: Secondary | ICD-10-CM | POA: Diagnosis not present

## 2022-11-21 DIAGNOSIS — Z48812 Encounter for surgical aftercare following surgery on the circulatory system: Secondary | ICD-10-CM | POA: Diagnosis not present

## 2022-11-21 DIAGNOSIS — R2689 Other abnormalities of gait and mobility: Secondary | ICD-10-CM | POA: Diagnosis not present

## 2022-11-24 DIAGNOSIS — I2510 Atherosclerotic heart disease of native coronary artery without angina pectoris: Secondary | ICD-10-CM | POA: Diagnosis not present

## 2022-11-24 DIAGNOSIS — Z48812 Encounter for surgical aftercare following surgery on the circulatory system: Secondary | ICD-10-CM | POA: Diagnosis not present

## 2022-11-24 DIAGNOSIS — I4891 Unspecified atrial fibrillation: Secondary | ICD-10-CM | POA: Diagnosis not present

## 2022-11-24 DIAGNOSIS — Z741 Need for assistance with personal care: Secondary | ICD-10-CM | POA: Diagnosis not present

## 2022-11-24 DIAGNOSIS — E7800 Pure hypercholesterolemia, unspecified: Secondary | ICD-10-CM | POA: Diagnosis not present

## 2022-11-24 DIAGNOSIS — R2689 Other abnormalities of gait and mobility: Secondary | ICD-10-CM | POA: Diagnosis not present

## 2022-11-24 DIAGNOSIS — I25110 Atherosclerotic heart disease of native coronary artery with unstable angina pectoris: Secondary | ICD-10-CM | POA: Diagnosis not present

## 2022-11-24 DIAGNOSIS — R278 Other lack of coordination: Secondary | ICD-10-CM | POA: Diagnosis not present

## 2022-11-24 DIAGNOSIS — Z951 Presence of aortocoronary bypass graft: Secondary | ICD-10-CM | POA: Diagnosis not present

## 2022-11-26 DIAGNOSIS — R278 Other lack of coordination: Secondary | ICD-10-CM | POA: Diagnosis not present

## 2022-11-26 DIAGNOSIS — E7800 Pure hypercholesterolemia, unspecified: Secondary | ICD-10-CM | POA: Diagnosis not present

## 2022-11-26 DIAGNOSIS — R2689 Other abnormalities of gait and mobility: Secondary | ICD-10-CM | POA: Diagnosis not present

## 2022-11-26 DIAGNOSIS — I2510 Atherosclerotic heart disease of native coronary artery without angina pectoris: Secondary | ICD-10-CM | POA: Diagnosis not present

## 2022-11-26 DIAGNOSIS — I4891 Unspecified atrial fibrillation: Secondary | ICD-10-CM | POA: Diagnosis not present

## 2022-11-26 DIAGNOSIS — I25110 Atherosclerotic heart disease of native coronary artery with unstable angina pectoris: Secondary | ICD-10-CM | POA: Diagnosis not present

## 2022-11-26 DIAGNOSIS — Z951 Presence of aortocoronary bypass graft: Secondary | ICD-10-CM | POA: Diagnosis not present

## 2022-11-26 DIAGNOSIS — Z741 Need for assistance with personal care: Secondary | ICD-10-CM | POA: Diagnosis not present

## 2022-11-26 DIAGNOSIS — Z48812 Encounter for surgical aftercare following surgery on the circulatory system: Secondary | ICD-10-CM | POA: Diagnosis not present

## 2022-11-28 DIAGNOSIS — I25110 Atherosclerotic heart disease of native coronary artery with unstable angina pectoris: Secondary | ICD-10-CM | POA: Diagnosis not present

## 2022-11-28 DIAGNOSIS — I4891 Unspecified atrial fibrillation: Secondary | ICD-10-CM | POA: Diagnosis not present

## 2022-11-28 DIAGNOSIS — Z48812 Encounter for surgical aftercare following surgery on the circulatory system: Secondary | ICD-10-CM | POA: Diagnosis not present

## 2022-11-28 DIAGNOSIS — R278 Other lack of coordination: Secondary | ICD-10-CM | POA: Diagnosis not present

## 2022-11-28 DIAGNOSIS — Z951 Presence of aortocoronary bypass graft: Secondary | ICD-10-CM | POA: Diagnosis not present

## 2022-11-28 DIAGNOSIS — R2689 Other abnormalities of gait and mobility: Secondary | ICD-10-CM | POA: Diagnosis not present

## 2022-11-28 DIAGNOSIS — E7800 Pure hypercholesterolemia, unspecified: Secondary | ICD-10-CM | POA: Diagnosis not present

## 2022-11-28 DIAGNOSIS — Z741 Need for assistance with personal care: Secondary | ICD-10-CM | POA: Diagnosis not present

## 2022-11-28 DIAGNOSIS — I2510 Atherosclerotic heart disease of native coronary artery without angina pectoris: Secondary | ICD-10-CM | POA: Diagnosis not present

## 2022-12-01 DIAGNOSIS — Z741 Need for assistance with personal care: Secondary | ICD-10-CM | POA: Diagnosis not present

## 2022-12-01 DIAGNOSIS — I2510 Atherosclerotic heart disease of native coronary artery without angina pectoris: Secondary | ICD-10-CM | POA: Diagnosis not present

## 2022-12-01 DIAGNOSIS — E7800 Pure hypercholesterolemia, unspecified: Secondary | ICD-10-CM | POA: Diagnosis not present

## 2022-12-01 DIAGNOSIS — I25110 Atherosclerotic heart disease of native coronary artery with unstable angina pectoris: Secondary | ICD-10-CM | POA: Diagnosis not present

## 2022-12-01 DIAGNOSIS — R2689 Other abnormalities of gait and mobility: Secondary | ICD-10-CM | POA: Diagnosis not present

## 2022-12-01 DIAGNOSIS — R278 Other lack of coordination: Secondary | ICD-10-CM | POA: Diagnosis not present

## 2022-12-01 DIAGNOSIS — Z48812 Encounter for surgical aftercare following surgery on the circulatory system: Secondary | ICD-10-CM | POA: Diagnosis not present

## 2022-12-01 DIAGNOSIS — Z951 Presence of aortocoronary bypass graft: Secondary | ICD-10-CM | POA: Diagnosis not present

## 2022-12-01 DIAGNOSIS — I4891 Unspecified atrial fibrillation: Secondary | ICD-10-CM | POA: Diagnosis not present

## 2022-12-04 ENCOUNTER — Ambulatory Visit: Payer: Medicare Other | Attending: Cardiology | Admitting: Cardiology

## 2022-12-04 ENCOUNTER — Encounter: Payer: Self-pay | Admitting: Cardiology

## 2022-12-04 VITALS — BP 136/60 | HR 56 | Ht 62.0 in | Wt 178.1 lb

## 2022-12-04 DIAGNOSIS — I10 Essential (primary) hypertension: Secondary | ICD-10-CM

## 2022-12-04 DIAGNOSIS — I4891 Unspecified atrial fibrillation: Secondary | ICD-10-CM

## 2022-12-04 DIAGNOSIS — Z951 Presence of aortocoronary bypass graft: Secondary | ICD-10-CM | POA: Diagnosis not present

## 2022-12-04 DIAGNOSIS — I9789 Other postprocedural complications and disorders of the circulatory system, not elsewhere classified: Secondary | ICD-10-CM | POA: Diagnosis not present

## 2022-12-04 NOTE — Patient Instructions (Signed)
Medication Instructions:   Your physician recommends that you continue on your current medications as directed. Please refer to the Current Medication list given to you today.  *If you need a refill on your cardiac medications before your next appointment, please call your pharmacy*   Lab Work:  None Ordered  If you have labs (blood work) drawn today and your tests are completely normal, you will receive your results only by: MyChart Message (if you have MyChart) OR A paper copy in the mail If you have any lab test that is abnormal or we need to change your treatment, we will call you to review the results.   Testing/Procedures:  None Ordered   Follow-Up: At [facility], you and your health needs are our priority.  As part of our continuing mission to provide you with exceptional heart care, we have created designated Provider Care Teams.  These Care Teams include your primary Cardiologist (physician) and Advanced Practice Providers (APPs -  Physician Assistants and Nurse Practitioners) who all work together to provide you with the care you need, when you need it.  We recommend signing up for the patient portal called "MyChart".  Sign up information is provided on this After Visit Summary.  MyChart is used to connect with patients for Virtual Visits (Telemedicine).  Patients are able to view lab/test results, encounter notes, upcoming appointments, etc.  Non-urgent messages can be sent to your provider as well.   To learn more about what you can do with MyChart, go to [email or web address].    Your next appointment:   6 month(s)  The format for your next appointment:   In Person  Provider:   You may see Brian Agbor-Etang, MD or one of the following Advanced Practice Providers on your designated Care Team:   Christopher Berge, NP Ryan Dunn, PA-C Cadence Furth, PA-C Sheri Hammock, NP  

## 2022-12-04 NOTE — Progress Notes (Signed)
Cardiology Office Note:    Date:  12/04/2022   ID:  Lisa [name], Dr., DOB May 21, 1941, MRN [id number]  PCP:  Lisa Ates, [name]  [facility] Cardiologist:  Lisa Odea, [name]  [facility] Electrophysiologist:  None   Referring [name]: Lisa Ates, [name]   Chief Complaint  Patient presents with   Other    F/u zio c/o elevated BP. Meds reviewed verbally with pt.    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  Feels well overall, breathing is improving with cardiac rehab.  Denies palpitations.  Cardiac monitor was placed to evaluate any recurrence of A-fib.  She had postop atrial fibrillation.  Amiodarone previously stopped.   Prior notes Echo 08/2022 showed normal systolic function EF 55 to 60%, aortic valve sclerosis.  Echocardiogram 10/2020 EF 55 to 60%, Cardiac monitor 12/2020 1 run of paroxysmal SVT, no A-fib or flutter noted  Past Medical History:  Diagnosis Date   Acne rosacea    Allergic rhinitis    Chronic kidney disease    kidney stones, Nov. 2013- tx with Lithotripsy, still has some present    Cough variant asthma    Degenerative disc disease    knees, ankles, back- OA   GERD (gastroesophageal reflux disease)    Headache(784.0)    since stopping aleve- 07/30/2013   Hyperlipemia    dyslipidemia   Hypertension    [facility] cardiac pharmacist follows pt. Lisa [name] ,has never taken anti-hypertensive   Osteoarthritis    Plantar fasciitis    (using orthotics-they are wearing out)- Dr Lisa [name]   Pneumonia    double pneumonia - relative to sinus infection , had chemical cauterization    PONV (postoperative nausea and vomiting)    urgency, N&V for days, memory & processing   Sleep apnea    Bringing cpap mask and tubing, study last done- 2006    Past Surgical History:  Procedure Laterality  Date   ABDOMINAL HYSTERECTOMY  1990's   BLADDER REPAIR     BLADDER SUSPENSION  90's   CORONARY ARTERY BYPASS GRAFT N/A 08/26/2022   Procedure: CORONARY ARTERY BYPASS GRAFTING (CABG) X THREE, USING LEFT INTERNAL MAMMARY ARTERY AND RIGHT LEG GREATER SAPHENOUS VEIN HARVESTED ENDOSCOPICALLY;  Surgeon: Lisa Sox, [name];  [facility];  Service: Open Heart Surgery;  Laterality: N/A;   LEFT HEART CATH AND CORONARY ANGIOGRAPHY N/A 08/20/2022   Procedure: LEFT HEART CATH AND CORONARY ANGIOGRAPHY;  Surgeon: Lisa Ouch, [name];  [facility] INVASIVE CV LAB;  Service: Cardiovascular;  Laterality: N/A;   MAXIMUM ACCESS (MAS)POSTERIOR LUMBAR INTERBODY FUSION (PLIF) 2 LEVEL N/A 08/12/2013   Procedure: FOR MAXIMUM ACCESS (MAS) POSTERIOR LUMBAR INTERBODY FUSION (PLIF) 2 LEVEL;  Surgeon: Lisa [name];  Location: [facility] NEURO ORS;  Service: Neurosurgery;  Laterality: N/A;  FOR MAXIMUM ACCESS (MAS) POSTERIOR LUMBAR INTERBODY FUSION (PLIF) 2 LEVEL   MULTIPLE TOOTH EXTRACTIONS     wisdom teeth extractions    POSTERIOR FUSION LUMBAR SPINE  08/12/2013   Dr Lisa [name]   TEE WITHOUT CARDIOVERSION N/A 08/26/2022   Procedure: TRANSESOPHAGEAL ECHOCARDIOGRAM (TEE);  Surgeon: Lisa Sox, [name];  Location: [facility];  Service: Open Heart Surgery;  Laterality: N/A;   TONSILLECTOMY      Current Medications: Current Meds  Medication Sig   acetaminophen (  TYLENOL) 500 MG tablet Take 1-2 tablets (500-1,000 mg total) by mouth every 6 (six) hours as needed for mild pain or fever. 1000 mg in the morning and at night  Additional 1000 mg daily at mid day if needed.   alendronate (FOSAMAX) 70 MG tablet Take 70 mg by mouth once a week. Take with a full glass of water on an empty stomach.  Saturday Morning   aspirin EC 81 MG tablet Take 1 tablet (81 mg total) by mouth daily.   atorvastatin (LIPITOR) 80 MG tablet Take 1 tablet (80 mg total) by mouth at bedtime.   Cholecalciferol (VITAMIN D-3) 125 MCG (5000 UT) TABS Take 5,000 Units by  mouth daily.   Cobalamin Combinations (NEURIVA PLUS PO) Take 1 capsule by mouth daily.   fenofibrate (TRICOR) 145 MG tablet Take 145 mg by mouth daily.   ferrous sulfate 324 MG TBEC Take 324 mg by mouth every other day.   fluticasone (FLONASE) 50 MCG/ACT nasal spray Place 2 sprays into the nose daily. Allergic rhinitis.   Magnesium 250 MG TABS Take 250 mg by mouth at bedtime.   metoprolol tartrate (LOPRESSOR) 25 MG tablet Take 0.5 tablets (12.5 mg total) by mouth 2 (two) times daily.   Multiple Vitamin (MULTIVITAMIN WITH MINERALS) TABS Take 1 tablet by mouth daily. Centrum  Woman 50+   Multiple Vitamins-Minerals (EMERGEN-C IMMUNE PLUS/VIT D PO) Take 2 tablets by mouth daily.   MYRBETRIQ 50 MG TB24 tablet Take 50 mg by mouth at bedtime.   OVER THE COUNTER MEDICATION Take 1 tablet by mouth daily. Nervive   Polyethyl Glycol-Propyl Glycol (SYSTANE OP) Place 1 drop into both eyes daily as needed (Dry eyes).   TURMERIC PO Take 1,000 mg by mouth 2 (two) times daily.     Allergies:   Other, Oxycodone, Sulfa antibiotics, and Sulfasalazine   Social History   Socioeconomic History   Marital status: Divorced    Spouse name: Not on file   Number of children: Not on file   Years of education: Not on file   Highest education level: Not on file  Occupational History   Occupation: retired  Tobacco Use   Smoking status: Never   Smokeless tobacco: Never  Substance and Sexual Activity   Alcohol use: Yes    Comment: on occassion   Drug use: No   Sexual activity: Not on file  Other Topics Concern   Not on file  Social History Narrative   Lives alone   Left Handed   Drinks 2-3 cups caffeine daily   Social Determinants of Health   Financial Resource Strain: Not on file  Food Insecurity: No Food Insecurity (08/28/2022)   Hunger Vital Sign    Worried About Running Out of Food in the Last Year: Never true    Ran Out of Food in the Last Year: Never true  Transportation Needs: No Transportation  Needs (08/28/2022)   PRAPARE - Administrator, Civil ServiceTransportation    Lack of Transportation (Medical): No    Lack of Transportation (Non-Medical): No  Physical Activity: Not on file  Stress: Not on file  Social Connections: Not on file     Family History: The patient's family history includes CVA in her father; Heart Problems in her father and mother; Heart attack in her father; Heart disease in her father.  ROS:   Please see the history of present illness.     All other systems reviewed and are negative.  EKGs/Labs/Other Studies Reviewed:    The following studies  were reviewed today:   EKG:  EKG not ordered today.    Recent Labs: 08/19/2022: ALT 19 08/30/2022: Hemoglobin 8.7; Platelets 164; TSH 3.164 08/31/2022: Magnesium 2.4 09/01/2022: BUN 38; Creatinine, Ser 1.32; Potassium 4.1; Sodium 139  Recent Lipid Panel    Component Value Date/Time   CHOL 171 08/19/2022 0957   TRIG 160 (H) 08/19/2022 0957   HDL 67 08/19/2022 0957   CHOLHDL 2.6 08/19/2022 0957   VLDL 32 08/19/2022 0957   LDLCALC 72 08/19/2022 0957   LDLDIRECT 92 08/19/2022 0957     Risk Assessment/Calculations:      Physical Exam:    VS:  BP 136/60 (BP Location: Left Arm, Patient Position: Sitting, Cuff Size: Large)   Pulse (!) 56   Ht 5\' 2"  (1.575 m)   Wt 178 lb 2 oz (80.8 kg)   SpO2 98%   BMI 32.58 kg/m     Wt Readings from Last 3 Encounters:  12/04/22 178 lb 2 oz (80.8 kg)  10/24/22 171 lb 9.6 oz (77.8 kg)  09/29/22 166 lb 14.4 oz (75.7 kg)     GEN:  Well nourished, well developed in no acute distress HEENT: Normal NECK: No JVD; No carotid bruits CARDIAC: RRR, 2/6 systolic murmur RESPIRATORY:  Clear to auscultation without rales, wheezing or rhonchi  ABDOMEN: Soft, non-tender, non-distended MUSCULOSKELETAL:  No edema; sternal surgical scar noted SKIN: Warm and dry NEUROLOGIC:  [name] and oriented x 3 PSYCHIATRIC:  Normal affect   ASSESSMENT:    1. S/P CABG x 3   2. Primary hypertension   3. Postoperative  atrial fibrillation (HCC)    PLAN:    In order of problems listed above:  CAD/CABG x3.  Continue aspirin 81 mg daily, continue Lipitor 80, fenofibrate.  Surgical scar healing normally, echo with normal EF.  Overall improving with cardiac rehab.  Continue rehab process. Hypertension, BP controlled.  Continue Lopressor. Postoperative A-fib.  Amiodarone previously stopped.  Repeat cardiac monitor with no recurrence of A-fib.  Follow-up in 6 months.   Medication Adjustments/Labs and Tests Ordered: Current medicines are reviewed at length with the patient today.  Concerns regarding medicines are outlined above.  No orders of the defined types were placed in this encounter.  No orders of the defined types were placed in this encounter.   Patient Instructions  Medication Instructions:   Your physician recommends that you continue on your current medications as directed. Please refer to the Current Medication list given to you today.  *If you need a refill on your cardiac medications before your next appointment, please call your pharmacy*   Lab Work:  None Ordered  If you have labs (blood work) drawn today and your tests are completely normal, you will receive your results only by: MyChart Message (if you have MyChart) OR A paper copy in the mail If you have any lab test that is abnormal or we need to change your treatment, we will call you to review the results.   Testing/Procedures:  None Ordered   Follow-Up: At [facility], you and your health needs are our priority.  As part of our continuing mission to provide you with exceptional heart care, we have created designated Provider Care Teams.  These Care Teams include your primary Cardiologist (physician) and Advanced Practice Providers (APPs -  Physician Assistants and Nurse Practitioners) who all work together to provide you with the care you need, when you need it.  We recommend signing up for the patient portal  called "  MyChart".  Sign up information is provided on this After Visit Summary.  MyChart is used to connect with patients for Virtual Visits (Telemedicine).  Patients are able to view lab/test results, encounter notes, upcoming appointments, etc.  Non-urgent messages can be sent to your provider as well.   To learn more about what you can do with MyChart, go to [email or web address].    Your next appointment:   6 month(s)  The format for your next appointment:   In Person  Provider:   You may see Lisa Odea, [name] or one of the following Advanced Practice Providers on your designated Care Team:   Nicolasa Ducking, NP Eula Listen, PA-C Cadence Fransico Michael, PA-C Charlsie Quest, NP   Signed, Lisa Odea, [name]  12/04/2022 11:55 AM    [facility]

## 2022-12-05 DIAGNOSIS — Z951 Presence of aortocoronary bypass graft: Secondary | ICD-10-CM | POA: Diagnosis not present

## 2022-12-05 DIAGNOSIS — I4891 Unspecified atrial fibrillation: Secondary | ICD-10-CM | POA: Diagnosis not present

## 2022-12-05 DIAGNOSIS — Z48812 Encounter for surgical aftercare following surgery on the circulatory system: Secondary | ICD-10-CM | POA: Diagnosis not present

## 2022-12-05 DIAGNOSIS — I25110 Atherosclerotic heart disease of native coronary artery with unstable angina pectoris: Secondary | ICD-10-CM | POA: Diagnosis not present

## 2022-12-05 DIAGNOSIS — E7800 Pure hypercholesterolemia, unspecified: Secondary | ICD-10-CM | POA: Diagnosis not present

## 2022-12-05 DIAGNOSIS — I2510 Atherosclerotic heart disease of native coronary artery without angina pectoris: Secondary | ICD-10-CM | POA: Diagnosis not present

## 2022-12-05 DIAGNOSIS — R2689 Other abnormalities of gait and mobility: Secondary | ICD-10-CM | POA: Diagnosis not present

## 2022-12-05 DIAGNOSIS — Z741 Need for assistance with personal care: Secondary | ICD-10-CM | POA: Diagnosis not present

## 2022-12-05 DIAGNOSIS — R278 Other lack of coordination: Secondary | ICD-10-CM | POA: Diagnosis not present

## 2022-12-08 DIAGNOSIS — R2689 Other abnormalities of gait and mobility: Secondary | ICD-10-CM | POA: Diagnosis not present

## 2022-12-08 DIAGNOSIS — E7800 Pure hypercholesterolemia, unspecified: Secondary | ICD-10-CM | POA: Diagnosis not present

## 2022-12-08 DIAGNOSIS — I25110 Atherosclerotic heart disease of native coronary artery with unstable angina pectoris: Secondary | ICD-10-CM | POA: Diagnosis not present

## 2022-12-08 DIAGNOSIS — I4891 Unspecified atrial fibrillation: Secondary | ICD-10-CM | POA: Diagnosis not present

## 2022-12-08 DIAGNOSIS — R278 Other lack of coordination: Secondary | ICD-10-CM | POA: Diagnosis not present

## 2022-12-08 DIAGNOSIS — I2510 Atherosclerotic heart disease of native coronary artery without angina pectoris: Secondary | ICD-10-CM | POA: Diagnosis not present

## 2022-12-08 DIAGNOSIS — Z741 Need for assistance with personal care: Secondary | ICD-10-CM | POA: Diagnosis not present

## 2022-12-08 DIAGNOSIS — Z48812 Encounter for surgical aftercare following surgery on the circulatory system: Secondary | ICD-10-CM | POA: Diagnosis not present

## 2022-12-08 DIAGNOSIS — Z951 Presence of aortocoronary bypass graft: Secondary | ICD-10-CM | POA: Diagnosis not present

## 2022-12-11 DIAGNOSIS — I10 Essential (primary) hypertension: Secondary | ICD-10-CM | POA: Diagnosis not present

## 2022-12-11 DIAGNOSIS — G4733 Obstructive sleep apnea (adult) (pediatric): Secondary | ICD-10-CM | POA: Diagnosis not present

## 2022-12-12 DIAGNOSIS — Z48812 Encounter for surgical aftercare following surgery on the circulatory system: Secondary | ICD-10-CM | POA: Diagnosis not present

## 2022-12-12 DIAGNOSIS — E7800 Pure hypercholesterolemia, unspecified: Secondary | ICD-10-CM | POA: Diagnosis not present

## 2022-12-12 DIAGNOSIS — I2510 Atherosclerotic heart disease of native coronary artery without angina pectoris: Secondary | ICD-10-CM | POA: Diagnosis not present

## 2022-12-12 DIAGNOSIS — R2689 Other abnormalities of gait and mobility: Secondary | ICD-10-CM | POA: Diagnosis not present

## 2022-12-12 DIAGNOSIS — I4891 Unspecified atrial fibrillation: Secondary | ICD-10-CM | POA: Diagnosis not present

## 2022-12-12 DIAGNOSIS — I25110 Atherosclerotic heart disease of native coronary artery with unstable angina pectoris: Secondary | ICD-10-CM | POA: Diagnosis not present

## 2022-12-12 DIAGNOSIS — Z741 Need for assistance with personal care: Secondary | ICD-10-CM | POA: Diagnosis not present

## 2022-12-12 DIAGNOSIS — Z951 Presence of aortocoronary bypass graft: Secondary | ICD-10-CM | POA: Diagnosis not present

## 2022-12-12 DIAGNOSIS — R278 Other lack of coordination: Secondary | ICD-10-CM | POA: Diagnosis not present

## 2022-12-19 DIAGNOSIS — I25110 Atherosclerotic heart disease of native coronary artery with unstable angina pectoris: Secondary | ICD-10-CM | POA: Diagnosis not present

## 2022-12-19 DIAGNOSIS — I4891 Unspecified atrial fibrillation: Secondary | ICD-10-CM | POA: Diagnosis not present

## 2022-12-19 DIAGNOSIS — R2689 Other abnormalities of gait and mobility: Secondary | ICD-10-CM | POA: Diagnosis not present

## 2022-12-19 DIAGNOSIS — E7800 Pure hypercholesterolemia, unspecified: Secondary | ICD-10-CM | POA: Diagnosis not present

## 2022-12-19 DIAGNOSIS — Z741 Need for assistance with personal care: Secondary | ICD-10-CM | POA: Diagnosis not present

## 2022-12-19 DIAGNOSIS — Z48812 Encounter for surgical aftercare following surgery on the circulatory system: Secondary | ICD-10-CM | POA: Diagnosis not present

## 2022-12-19 DIAGNOSIS — Z951 Presence of aortocoronary bypass graft: Secondary | ICD-10-CM | POA: Diagnosis not present

## 2022-12-19 DIAGNOSIS — R278 Other lack of coordination: Secondary | ICD-10-CM | POA: Diagnosis not present

## 2022-12-19 DIAGNOSIS — I2510 Atherosclerotic heart disease of native coronary artery without angina pectoris: Secondary | ICD-10-CM | POA: Diagnosis not present

## 2022-12-24 DIAGNOSIS — Z951 Presence of aortocoronary bypass graft: Secondary | ICD-10-CM | POA: Diagnosis not present

## 2022-12-24 DIAGNOSIS — E7800 Pure hypercholesterolemia, unspecified: Secondary | ICD-10-CM | POA: Diagnosis not present

## 2022-12-24 DIAGNOSIS — I2510 Atherosclerotic heart disease of native coronary artery without angina pectoris: Secondary | ICD-10-CM | POA: Diagnosis not present

## 2022-12-24 DIAGNOSIS — Z741 Need for assistance with personal care: Secondary | ICD-10-CM | POA: Diagnosis not present

## 2022-12-24 DIAGNOSIS — R278 Other lack of coordination: Secondary | ICD-10-CM | POA: Diagnosis not present

## 2022-12-24 DIAGNOSIS — R2689 Other abnormalities of gait and mobility: Secondary | ICD-10-CM | POA: Diagnosis not present

## 2022-12-24 DIAGNOSIS — I25110 Atherosclerotic heart disease of native coronary artery with unstable angina pectoris: Secondary | ICD-10-CM | POA: Diagnosis not present

## 2022-12-24 DIAGNOSIS — I4891 Unspecified atrial fibrillation: Secondary | ICD-10-CM | POA: Diagnosis not present

## 2022-12-24 DIAGNOSIS — Z48812 Encounter for surgical aftercare following surgery on the circulatory system: Secondary | ICD-10-CM | POA: Diagnosis not present

## 2022-12-26 DIAGNOSIS — R278 Other lack of coordination: Secondary | ICD-10-CM | POA: Diagnosis not present

## 2022-12-26 DIAGNOSIS — Z741 Need for assistance with personal care: Secondary | ICD-10-CM | POA: Diagnosis not present

## 2022-12-26 DIAGNOSIS — Z951 Presence of aortocoronary bypass graft: Secondary | ICD-10-CM | POA: Diagnosis not present

## 2022-12-26 DIAGNOSIS — Z48812 Encounter for surgical aftercare following surgery on the circulatory system: Secondary | ICD-10-CM | POA: Diagnosis not present

## 2022-12-26 DIAGNOSIS — R2689 Other abnormalities of gait and mobility: Secondary | ICD-10-CM | POA: Diagnosis not present

## 2022-12-26 DIAGNOSIS — I25110 Atherosclerotic heart disease of native coronary artery with unstable angina pectoris: Secondary | ICD-10-CM | POA: Diagnosis not present

## 2022-12-26 DIAGNOSIS — I2510 Atherosclerotic heart disease of native coronary artery without angina pectoris: Secondary | ICD-10-CM | POA: Diagnosis not present

## 2022-12-26 DIAGNOSIS — E7800 Pure hypercholesterolemia, unspecified: Secondary | ICD-10-CM | POA: Diagnosis not present

## 2022-12-26 DIAGNOSIS — I4891 Unspecified atrial fibrillation: Secondary | ICD-10-CM | POA: Diagnosis not present

## 2022-12-29 DIAGNOSIS — Z951 Presence of aortocoronary bypass graft: Secondary | ICD-10-CM | POA: Diagnosis not present

## 2022-12-29 DIAGNOSIS — E7800 Pure hypercholesterolemia, unspecified: Secondary | ICD-10-CM | POA: Diagnosis not present

## 2022-12-29 DIAGNOSIS — Z48812 Encounter for surgical aftercare following surgery on the circulatory system: Secondary | ICD-10-CM | POA: Diagnosis not present

## 2022-12-29 DIAGNOSIS — Z741 Need for assistance with personal care: Secondary | ICD-10-CM | POA: Diagnosis not present

## 2022-12-29 DIAGNOSIS — I25110 Atherosclerotic heart disease of native coronary artery with unstable angina pectoris: Secondary | ICD-10-CM | POA: Diagnosis not present

## 2022-12-29 DIAGNOSIS — I4891 Unspecified atrial fibrillation: Secondary | ICD-10-CM | POA: Diagnosis not present

## 2022-12-29 DIAGNOSIS — R278 Other lack of coordination: Secondary | ICD-10-CM | POA: Diagnosis not present

## 2022-12-29 DIAGNOSIS — I2510 Atherosclerotic heart disease of native coronary artery without angina pectoris: Secondary | ICD-10-CM | POA: Diagnosis not present

## 2022-12-29 DIAGNOSIS — R2689 Other abnormalities of gait and mobility: Secondary | ICD-10-CM | POA: Diagnosis not present

## 2022-12-31 DIAGNOSIS — E785 Hyperlipidemia, unspecified: Secondary | ICD-10-CM | POA: Diagnosis not present

## 2022-12-31 DIAGNOSIS — I129 Hypertensive chronic kidney disease with stage 1 through stage 4 chronic kidney disease, or unspecified chronic kidney disease: Secondary | ICD-10-CM | POA: Diagnosis not present

## 2022-12-31 DIAGNOSIS — Z0000 Encounter for general adult medical examination without abnormal findings: Secondary | ICD-10-CM | POA: Diagnosis not present

## 2022-12-31 DIAGNOSIS — D509 Iron deficiency anemia, unspecified: Secondary | ICD-10-CM | POA: Diagnosis not present

## 2022-12-31 DIAGNOSIS — K219 Gastro-esophageal reflux disease without esophagitis: Secondary | ICD-10-CM | POA: Diagnosis not present

## 2022-12-31 DIAGNOSIS — M8589 Other specified disorders of bone density and structure, multiple sites: Secondary | ICD-10-CM | POA: Diagnosis not present

## 2022-12-31 DIAGNOSIS — G4733 Obstructive sleep apnea (adult) (pediatric): Secondary | ICD-10-CM | POA: Diagnosis not present

## 2022-12-31 DIAGNOSIS — I2510 Atherosclerotic heart disease of native coronary artery without angina pectoris: Secondary | ICD-10-CM | POA: Diagnosis not present

## 2023-01-01 DIAGNOSIS — R8279 Other abnormal findings on microbiological examination of urine: Secondary | ICD-10-CM | POA: Diagnosis not present

## 2023-01-02 DIAGNOSIS — I4891 Unspecified atrial fibrillation: Secondary | ICD-10-CM | POA: Diagnosis not present

## 2023-01-02 DIAGNOSIS — I25110 Atherosclerotic heart disease of native coronary artery with unstable angina pectoris: Secondary | ICD-10-CM | POA: Diagnosis not present

## 2023-01-02 DIAGNOSIS — E7800 Pure hypercholesterolemia, unspecified: Secondary | ICD-10-CM | POA: Diagnosis not present

## 2023-01-02 DIAGNOSIS — Z741 Need for assistance with personal care: Secondary | ICD-10-CM | POA: Diagnosis not present

## 2023-01-02 DIAGNOSIS — I2510 Atherosclerotic heart disease of native coronary artery without angina pectoris: Secondary | ICD-10-CM | POA: Diagnosis not present

## 2023-01-02 DIAGNOSIS — Z951 Presence of aortocoronary bypass graft: Secondary | ICD-10-CM | POA: Diagnosis not present

## 2023-01-02 DIAGNOSIS — R2689 Other abnormalities of gait and mobility: Secondary | ICD-10-CM | POA: Diagnosis not present

## 2023-01-02 DIAGNOSIS — Z48812 Encounter for surgical aftercare following surgery on the circulatory system: Secondary | ICD-10-CM | POA: Diagnosis not present

## 2023-01-02 DIAGNOSIS — R278 Other lack of coordination: Secondary | ICD-10-CM | POA: Diagnosis not present

## 2023-01-05 DIAGNOSIS — R2689 Other abnormalities of gait and mobility: Secondary | ICD-10-CM | POA: Diagnosis not present

## 2023-01-05 DIAGNOSIS — I2510 Atherosclerotic heart disease of native coronary artery without angina pectoris: Secondary | ICD-10-CM | POA: Diagnosis not present

## 2023-01-05 DIAGNOSIS — Z48812 Encounter for surgical aftercare following surgery on the circulatory system: Secondary | ICD-10-CM | POA: Diagnosis not present

## 2023-01-05 DIAGNOSIS — Z951 Presence of aortocoronary bypass graft: Secondary | ICD-10-CM | POA: Diagnosis not present

## 2023-01-05 DIAGNOSIS — I4891 Unspecified atrial fibrillation: Secondary | ICD-10-CM | POA: Diagnosis not present

## 2023-01-05 DIAGNOSIS — R278 Other lack of coordination: Secondary | ICD-10-CM | POA: Diagnosis not present

## 2023-01-05 DIAGNOSIS — I25110 Atherosclerotic heart disease of native coronary artery with unstable angina pectoris: Secondary | ICD-10-CM | POA: Diagnosis not present

## 2023-01-05 DIAGNOSIS — Z741 Need for assistance with personal care: Secondary | ICD-10-CM | POA: Diagnosis not present

## 2023-01-05 DIAGNOSIS — E7800 Pure hypercholesterolemia, unspecified: Secondary | ICD-10-CM | POA: Diagnosis not present

## 2023-01-07 DIAGNOSIS — R278 Other lack of coordination: Secondary | ICD-10-CM | POA: Diagnosis not present

## 2023-01-07 DIAGNOSIS — I25110 Atherosclerotic heart disease of native coronary artery with unstable angina pectoris: Secondary | ICD-10-CM | POA: Diagnosis not present

## 2023-01-07 DIAGNOSIS — Z48812 Encounter for surgical aftercare following surgery on the circulatory system: Secondary | ICD-10-CM | POA: Diagnosis not present

## 2023-01-07 DIAGNOSIS — I2510 Atherosclerotic heart disease of native coronary artery without angina pectoris: Secondary | ICD-10-CM | POA: Diagnosis not present

## 2023-01-07 DIAGNOSIS — Z741 Need for assistance with personal care: Secondary | ICD-10-CM | POA: Diagnosis not present

## 2023-01-07 DIAGNOSIS — Z951 Presence of aortocoronary bypass graft: Secondary | ICD-10-CM | POA: Diagnosis not present

## 2023-01-07 DIAGNOSIS — I4891 Unspecified atrial fibrillation: Secondary | ICD-10-CM | POA: Diagnosis not present

## 2023-01-07 DIAGNOSIS — E7800 Pure hypercholesterolemia, unspecified: Secondary | ICD-10-CM | POA: Diagnosis not present

## 2023-01-07 DIAGNOSIS — R2689 Other abnormalities of gait and mobility: Secondary | ICD-10-CM | POA: Diagnosis not present

## 2023-01-08 DIAGNOSIS — Z1211 Encounter for screening for malignant neoplasm of colon: Secondary | ICD-10-CM | POA: Diagnosis not present

## 2023-01-08 DIAGNOSIS — Z1212 Encounter for screening for malignant neoplasm of rectum: Secondary | ICD-10-CM | POA: Diagnosis not present

## 2023-01-16 ENCOUNTER — Encounter: Payer: Self-pay | Admitting: Cardiology

## 2023-01-17 DIAGNOSIS — G4733 Obstructive sleep apnea (adult) (pediatric): Secondary | ICD-10-CM | POA: Diagnosis not present

## 2023-01-20 DIAGNOSIS — Z1231 Encounter for screening mammogram for malignant neoplasm of breast: Secondary | ICD-10-CM | POA: Diagnosis not present

## 2023-03-04 DIAGNOSIS — D509 Iron deficiency anemia, unspecified: Secondary | ICD-10-CM | POA: Diagnosis not present

## 2023-03-04 DIAGNOSIS — R195 Other fecal abnormalities: Secondary | ICD-10-CM | POA: Diagnosis not present

## 2023-03-05 ENCOUNTER — Other Ambulatory Visit: Payer: Self-pay | Admitting: Internal Medicine

## 2023-03-05 ENCOUNTER — Telehealth: Payer: Self-pay | Admitting: *Deleted

## 2023-03-05 NOTE — Telephone Encounter (Signed)
Pt has been scheduled for tele pre op appt 04/06/23 @ 9:40. Med rec and consent are done.

## 2023-03-05 NOTE — Telephone Encounter (Signed)
   Pre-operative Risk Assessment    Patient Name: Lisa [name], Dr.  Alferd Apa: 04/21/1941 MRN: [id number]      Request for Surgical Clearance    Procedure:   ENDOSCOPY/COLONOSCOPY  Date of Surgery:  Clearance 05/19/23                                 Surgeon:  DR. Wyn Forster Surgeon's Group or Practice Name:  [facility] Phone number:  [phone] Fax number:  [phone]   Type of Clearance Requested:   - Medical ; NO MEDICATIONS LISTED AS NEEDING TO BE HELD   Type of Anesthesia:   PROPOFOL   Additional requests/questions:    Jiles Prows   03/05/2023, 9:34 AM

## 2023-03-05 NOTE — Telephone Encounter (Signed)
   Name: Lisa [name], Dr.  Alferd Apa: 1941/12/06  MRN: [id number]  Primary Cardiologist: Kate Sable, MD   Preoperative team, please contact this patient and set up a phone call appointment on or after 03/19/2023 for further preoperative risk assessment. Please obtain consent and complete medication review. Thank you for your help.  I confirm that guidance regarding antiplatelet and oral anticoagulation therapy has been completed and, if necessary, noted below (none requested though patient does take aspirin 81 mg daily).   Regarding ASA therapy, we recommend continuation of ASA throughout the perioperative period.    Lenna Sciara, NP 03/05/2023, 12:16 PM [place]

## 2023-03-05 NOTE — Telephone Encounter (Signed)
Pt has been scheduled for tele pre op appt 04/06/23 @ 9:40. Med rec and consent are done.     Patient Consent for Virtual Visit        Lisa [name], Dr. has provided verbal consent on 03/05/2023 for a virtual visit (video or telephone).   CONSENT FOR VIRTUAL VISIT FOR:  Lisa [name], Dr.  By participating in this virtual visit I agree to the following:  I hereby voluntarily request, consent and authorize [facility] and its employed or contracted physicians, physician assistants, nurse practitioners or other licensed health care professionals (the Practitioner), to provide me with telemedicine health care services (the "Services") as deemed necessary by the treating Practitioner. I acknowledge and consent to receive the Services by the Practitioner via telemedicine. I understand that the telemedicine visit will involve communicating with the Practitioner through live audiovisual communication technology and the disclosure of certain medical information by electronic transmission. I acknowledge that I have been given the opportunity to request an in-person assessment or other available alternative prior to the telemedicine visit and am voluntarily participating in the telemedicine visit.  I understand that I have the right to withhold or withdraw my consent to the use of telemedicine in the course of my care at any time, without affecting my right to future care or treatment, and that the Practitioner or I may terminate the telemedicine visit at any time. I understand that I have the right to inspect all information obtained and/or recorded in the course of the telemedicine visit and may receive copies of available information for a reasonable fee.  I understand that some of the potential risks of receiving the Services via telemedicine include:  Delay or interruption in medical evaluation due to technological equipment failure or disruption; Information transmitted may not be  sufficient (e.g. poor resolution of images) to allow for appropriate medical decision making by the Practitioner; and/or  In rare instances, security protocols could fail, causing a breach of personal health information.  Furthermore, I acknowledge that it is my responsibility to provide information about my medical history, conditions and care that is complete and accurate to the best of my ability. I acknowledge that Practitioner's advice, recommendations, and/or decision may be based on factors not within their control, such as incomplete or inaccurate data provided by me or distortions of diagnostic images or specimens that may result from electronic transmissions. I understand that the practice of medicine is not an exact science and that Practitioner makes no warranties or guarantees regarding treatment outcomes. I acknowledge that a copy of this consent can be made available to me via my patient portal ([facility]), or I can request a printed copy by calling the office of [facility].    I understand that my insurance will be billed for this visit.   I have read or had this consent read to me. I understand the contents of this consent, which adequately explains the benefits and risks of the Services being provided via telemedicine.  I have been provided ample opportunity to ask questions regarding this consent and the Services and have had my questions answered to my satisfaction. I give my informed consent for the services to be provided through the use of telemedicine in my medical care  .

## 2023-03-12 DIAGNOSIS — M545 Low back pain, unspecified: Secondary | ICD-10-CM | POA: Diagnosis not present

## 2023-03-12 DIAGNOSIS — M7061 Trochanteric bursitis, right hip: Secondary | ICD-10-CM | POA: Diagnosis not present

## 2023-03-12 DIAGNOSIS — M48061 Spinal stenosis, lumbar region without neurogenic claudication: Secondary | ICD-10-CM | POA: Diagnosis not present

## 2023-04-02 DIAGNOSIS — M7061 Trochanteric bursitis, right hip: Secondary | ICD-10-CM | POA: Diagnosis not present

## 2023-04-06 ENCOUNTER — Ambulatory Visit: Payer: Medicare Other | Attending: Cardiovascular Disease

## 2023-04-06 DIAGNOSIS — Z01810 Encounter for preprocedural cardiovascular examination: Secondary | ICD-10-CM

## 2023-04-06 NOTE — Progress Notes (Signed)
Virtual Visit via Telephone Note   Because of Lisa [name], Dr.'s co-morbid illnesses, she is at least at moderate risk for complications without adequate follow up.  This format is felt to be most appropriate for this patient at this time.  The patient did not have access to video technology/had technical difficulties with video requiring transitioning to audio format only (telephone).  All issues noted in this document were discussed and addressed.  No physical exam could be performed with this format.  Please refer to the patient's chart for her consent to telehealth for [facility].  Evaluation Performed:  Preoperative cardiovascular risk assessment _____________   Date:  04/06/2023   Patient ID:  Lisa [name], Dr., DOB 10/24/1941, MRN [id number] Patient Location:  Home Provider location:   Office  Primary Care Provider:  Thana Ates, MD Primary Cardiologist:  Debbe Odea, MD  Chief Complaint / Patient Profile   82 y.o. y/o female with a h/o CAD status post CABG x 3 08/2022 (LIMA to LAD, SVG to OM 2, SVG to RCA), hypertension, hyperlipidemia, CVA, and postoperative atrial fibrillation who is pending endoscopy/colonoscopy and presents today for telephonic preoperative cardiovascular risk assessment.  History of Present Illness    Lisa [name], Dr. is a 82 y.o. female who presents via audio/video conferencing for a telehealth visit today.  Pt was last seen in cardiology clinic on 12/04/22 by Dr. Azucena Cecil.  At that time Lisa [name], Dr. was doing well .  The patient is now pending procedure as outlined above. Since her last visit, she has been doing pretty well.  She still does not have a lot of energy.  She has a lot of back problems and has been sitting a lot since surgery.  She does see a pain specialist.  Blood pressure has been higher than normal 166/78.  She had an injection in her hip that has helped some.  She does meet minimum  METS.   Regarding ASA therapy, we recommend continuation of ASA throughout the perioperative period.     Past Medical History    Past Medical History:  Diagnosis Date   Acne rosacea    Allergic rhinitis    Chronic kidney disease    kidney stones, Nov. 2013- tx with Lithotripsy, still has some present    Cough variant asthma    Degenerative disc disease    knees, ankles, back- OA   GERD (gastroesophageal reflux disease)    Headache(784.0)    since stopping aleve- 07/30/2013   Hyperlipemia    dyslipidemia   Hypertension    [facility] cardiac pharmacist follows pt. Riki Rusk Smart ,has never taken anti-hypertensive   Osteoarthritis    Plantar fasciitis    (using orthotics-they are wearing out)- Dr Luther Bradley   Pneumonia    double pneumonia - relative to sinus infection , had chemical cauterization    PONV (postoperative nausea and vomiting)    urgency, N&V for days, memory & processing   Sleep apnea    Bringing cpap mask and tubing, study last done- 2006   Past Surgical History:  Procedure Laterality Date   ABDOMINAL HYSTERECTOMY  1990's   BLADDER REPAIR     BLADDER SUSPENSION  90's   CORONARY ARTERY BYPASS GRAFT N/A 08/26/2022   Procedure: CORONARY ARTERY BYPASS GRAFTING (CABG) X THREE, USING LEFT INTERNAL MAMMARY ARTERY AND RIGHT LEG GREATER SAPHENOUS VEIN HARVESTED ENDOSCOPICALLY;  Surgeon: Lovett Sox, MD;  Location: [facility] OR;  Service: Open Heart Surgery;  Laterality: N/A;   LEFT HEART CATH AND CORONARY ANGIOGRAPHY N/A 08/20/2022   Procedure: LEFT HEART CATH AND CORONARY ANGIOGRAPHY;  Surgeon: Iran Ouch, MD;  [facility] INV[facility] LAB;  Service: Cardiovascular;  Laterality: N/A;   MAXIMUM ACCESS (MAS)POSTERIOR LUMBAR INTERBODY FUSION (PLIF) 2 LEVEL N/A 08/12/2013   Procedure: FOR MAXIMUM ACCESS (MAS) POSTERIOR LUMBAR INTERBODY FUSION (PLIF) 2 LEVEL;  Surgeon: Tia Alert, MD;  Location[facility] NEURO [facility]S;  Service: Neurosurgery;  Laterality: N/A;  FOR MAXIMUM ACCESS (MAS)  POSTERIOR LUMBAR INTERBODY FUSION (PLIF) 2 LEVEL   MULTIPLE TOOTH EXTRACTIONS     wisdom teeth extractions    POSTERIOR FUSION LUMBAR SPINE  08/12/2013   Dr Yetta Barre   TEE WITHOUT CARDIOVERSION N/A 08/26/2022   Procedure: TRANSESOPHAGEAL ECHOCARDIOGRAM (TEE);  Surgeon: Lovett Sox, MD;  Location: [facility];  Service: Open Heart Surgery;  Laterality: N/A;   TONSILLECTOMY      Allergies  Allergies  Allergen Reactions   Other Other (See Comments)    Anesthesia causes nausea and vomiting, memory difficulty and problems processing.   Oxycodone Nausea Only   Sulfa Antibiotics Rash   Sulfasalazine Rash    Home Medications    Prior to Admission medications   Medication Sig Start Date End Date Taking? Authorizing Provider  acetaminophen (TYLENOL) 500 MG tablet Take 1-2 tablets (500-1,000 mg total) by mouth every 6 (six) hours as needed for mild pain or fever. 1000 mg in the morning and at night  Additional 1000 mg daily at mid day if needed. 09/01/22   Barrett, Erin R, PA-C  alendronate (FOSAMAX) 70 MG tablet Take 70 mg by mouth once a week. Take with a full glass of water on an empty stomach.  Saturday Morning    [provider]  aspirin EC 81 MG tablet Take 1 tablet (81 mg total) by mouth daily. 10/24/22   Debbe Odea, MD  atorvastatin (LIPITOR) 80 MG tablet Take 1 tablet (80 mg total) by mouth at bedtime. 09/01/22   Barrett, Rae Roam, PA-C  Cholecalciferol (VITAMIN D-3) 125 MCG (5000 UT) TABS Take 5,000 Units by mouth daily.    [provider]  Cobalamin Combinations (NEURIVA PLUS PO) Take 1 capsule by mouth daily.    [provider]  fenofibrate (TRICOR) 145 MG tablet Take 145 mg by mouth daily.    [provider]  ferrous sulfate 324 MG TBEC Take 324 mg by mouth every other day.    [provider]  fluticasone (FLONASE) 50 MCG/ACT nasal spray Place 2 sprays into the nose daily. Allergic rhinitis.    [provider]  Magnesium 250 MG  TABS Take 250 mg by mouth at bedtime.    [provider]  metoprolol tartrate (LOPRESSOR) 25 MG tablet Take 0.5 tablets (12.5 mg total) by mouth 2 (two) times daily. 09/15/22   Sondra Barges, PA-C  Multiple Vitamin (MULTIVITAMIN WITH MINERALS) TABS Take 1 tablet by mouth daily. Centrum  Woman 50+    [provider]  Multiple Vitamins-Minerals (EMERGEN-C IMMUNE PLUS/VIT D PO) Take 2 tablets by mouth daily.    [provider]  MYRBETRIQ 50 MG TB24 tablet Take 50 mg by mouth at bedtime. 02/25/22   [provider]  OVER THE COUNTER MEDICATION Take 1 tablet by mouth daily. Angus Seller    [provider]  Polyethyl Glycol-Propyl Glycol (SYSTANE OP) Place 1 drop into both eyes daily as needed (Dry eyes).    [provider]  TURMERIC PO Take  1,000 mg by mouth 2 (two) times daily.    [provider]    Physical Exam    Vital Signs:  Lisa [name], Dr. does not have vital signs available for review today.  Given telephonic nature of communication, physical exam is limited. AAOx3. NAD. Normal affect.  Speech and respirations are unlabored.  Accessory Clinical Findings    None  Assessment & Plan    1.  Preoperative Cardiovascular Risk Assessment:  Ms. Howser's perioperative risk of a major cardiac event is 0.9% according to the Revised Cardiac Risk Index (RCRI).  Therefore, she is at low risk for perioperative complications.   Her functional capacity is good at 5.07 METs according to the Duke Activity Status Index (DASI). Recommendations: According to ACC/AHA guidelines, no further cardiovascular testing needed.  The patient may proceed to surgery at acceptable risk.   Antiplatelet and/or Anticoagulation Recommendations:  The patient should remain on Aspirin without interruption.   If needed can hold ASA x 5-7 days and restart when medically safe to do so.  A copy of this note will be routed to requesting surgeon.  Time:   Today, I  have spent 10 minutes with the patient with telehealth technology discussing medical history, symptoms, and management plan.     Sharlene Dory, PA-C  04/06/2023, 7:57 AM

## 2023-04-20 DIAGNOSIS — G4733 Obstructive sleep apnea (adult) (pediatric): Secondary | ICD-10-CM | POA: Diagnosis not present

## 2023-05-05 DIAGNOSIS — M7061 Trochanteric bursitis, right hip: Secondary | ICD-10-CM | POA: Diagnosis not present

## 2023-05-05 DIAGNOSIS — M48061 Spinal stenosis, lumbar region without neurogenic claudication: Secondary | ICD-10-CM | POA: Diagnosis not present

## 2023-05-11 ENCOUNTER — Encounter (HOSPITAL_COMMUNITY): Payer: Self-pay | Admitting: Internal Medicine

## 2023-05-19 ENCOUNTER — Ambulatory Visit (HOSPITAL_BASED_OUTPATIENT_CLINIC_OR_DEPARTMENT_OTHER): Payer: Medicare Other | Admitting: Anesthesiology

## 2023-05-19 ENCOUNTER — Ambulatory Visit (HOSPITAL_COMMUNITY)
Admission: RE | Admit: 2023-05-19 | Discharge: 2023-05-19 | Disposition: A | Discharge status: Home / Self Care | Payer: Medicare Other | Attending: Internal Medicine | Admitting: Internal Medicine

## 2023-05-19 ENCOUNTER — Other Ambulatory Visit: Payer: Self-pay

## 2023-05-19 ENCOUNTER — Encounter (HOSPITAL_COMMUNITY): Payer: Self-pay | Admitting: Internal Medicine

## 2023-05-19 ENCOUNTER — Encounter (HOSPITAL_COMMUNITY)
Admission: RE | Disposition: A | Discharge status: Home / Self Care | Payer: Self-pay | Source: Home / Self Care | Attending: Internal Medicine

## 2023-05-19 ENCOUNTER — Ambulatory Visit (HOSPITAL_COMMUNITY): Payer: Medicare Other | Admitting: Anesthesiology

## 2023-05-19 DIAGNOSIS — Z8673 Personal history of transient ischemic attack (TIA), and cerebral infarction without residual deficits: Secondary | ICD-10-CM | POA: Insufficient documentation

## 2023-05-19 DIAGNOSIS — D175 Benign lipomatous neoplasm of intra-abdominal organs: Secondary | ICD-10-CM | POA: Diagnosis not present

## 2023-05-19 DIAGNOSIS — M810 Age-related osteoporosis without current pathological fracture: Secondary | ICD-10-CM | POA: Diagnosis not present

## 2023-05-19 DIAGNOSIS — Z1211 Encounter for screening for malignant neoplasm of colon: Secondary | ICD-10-CM | POA: Diagnosis not present

## 2023-05-19 DIAGNOSIS — K5730 Diverticulosis of large intestine without perforation or abscess without bleeding: Secondary | ICD-10-CM | POA: Insufficient documentation

## 2023-05-19 DIAGNOSIS — R195 Other fecal abnormalities: Secondary | ICD-10-CM | POA: Diagnosis not present

## 2023-05-19 DIAGNOSIS — K635 Polyp of colon: Secondary | ICD-10-CM | POA: Diagnosis not present

## 2023-05-19 DIAGNOSIS — Z9989 Dependence on other enabling machines and devices: Secondary | ICD-10-CM | POA: Diagnosis not present

## 2023-05-19 DIAGNOSIS — D122 Benign neoplasm of ascending colon: Secondary | ICD-10-CM | POA: Diagnosis not present

## 2023-05-19 DIAGNOSIS — K6389 Other specified diseases of intestine: Secondary | ICD-10-CM | POA: Diagnosis not present

## 2023-05-19 DIAGNOSIS — K644 Residual hemorrhoidal skin tags: Secondary | ICD-10-CM | POA: Diagnosis not present

## 2023-05-19 DIAGNOSIS — K640 First degree hemorrhoids: Secondary | ICD-10-CM | POA: Insufficient documentation

## 2023-05-19 DIAGNOSIS — D124 Benign neoplasm of descending colon: Secondary | ICD-10-CM | POA: Insufficient documentation

## 2023-05-19 DIAGNOSIS — G4733 Obstructive sleep apnea (adult) (pediatric): Secondary | ICD-10-CM | POA: Diagnosis not present

## 2023-05-19 DIAGNOSIS — K2970 Gastritis, unspecified, without bleeding: Secondary | ICD-10-CM

## 2023-05-19 DIAGNOSIS — J45909 Unspecified asthma, uncomplicated: Secondary | ICD-10-CM | POA: Diagnosis not present

## 2023-05-19 DIAGNOSIS — D123 Benign neoplasm of transverse colon: Secondary | ICD-10-CM | POA: Diagnosis not present

## 2023-05-19 DIAGNOSIS — G4730 Sleep apnea, unspecified: Secondary | ICD-10-CM | POA: Insufficient documentation

## 2023-05-19 DIAGNOSIS — M48061 Spinal stenosis, lumbar region without neurogenic claudication: Secondary | ICD-10-CM | POA: Diagnosis not present

## 2023-05-19 DIAGNOSIS — D125 Benign neoplasm of sigmoid colon: Secondary | ICD-10-CM | POA: Insufficient documentation

## 2023-05-19 DIAGNOSIS — I10 Essential (primary) hypertension: Secondary | ICD-10-CM | POA: Diagnosis not present

## 2023-05-19 DIAGNOSIS — K2289 Other specified disease of esophagus: Secondary | ICD-10-CM

## 2023-05-19 DIAGNOSIS — I2510 Atherosclerotic heart disease of native coronary artery without angina pectoris: Secondary | ICD-10-CM | POA: Insufficient documentation

## 2023-05-19 DIAGNOSIS — D509 Iron deficiency anemia, unspecified: Secondary | ICD-10-CM | POA: Diagnosis not present

## 2023-05-19 DIAGNOSIS — M1990 Unspecified osteoarthritis, unspecified site: Secondary | ICD-10-CM | POA: Insufficient documentation

## 2023-05-19 HISTORY — PX: ESOPHAGOGASTRODUODENOSCOPY (EGD) WITH PROPOFOL: SHX5813

## 2023-05-19 HISTORY — PX: POLYPECTOMY: SHX5525

## 2023-05-19 HISTORY — PX: COLONOSCOPY WITH PROPOFOL: SHX5780

## 2023-05-19 HISTORY — PX: BIOPSY: SHX5522

## 2023-05-19 SURGERY — COLONOSCOPY WITH PROPOFOL
Anesthesia: Monitor Anesthesia Care

## 2023-05-19 MED ORDER — PROPOFOL 1000 MG/100ML IV EMUL
INTRAVENOUS | Status: AC
  Filled 2023-05-19: qty 100

## 2023-05-19 MED ORDER — LACTATED RINGERS IV SOLN: INTRAVENOUS | Status: DC

## 2023-05-19 MED ORDER — SODIUM CHLORIDE 0.9 % IV SOLN: INTRAVENOUS | Status: DC

## 2023-05-19 MED ORDER — LIDOCAINE 2% (20 MG/ML) 5 ML SYRINGE
INTRAMUSCULAR | Status: DC | PRN
  Administered 2023-05-19: 100 mg via INTRAVENOUS

## 2023-05-19 MED ORDER — PROPOFOL 500 MG/50ML IV EMUL
INTRAVENOUS | Status: DC | PRN
  Administered 2023-05-19: 125 ug/kg/min via INTRAVENOUS

## 2023-05-19 MED ORDER — PROPOFOL 10 MG/ML IV BOLUS
INTRAVENOUS | Status: DC | PRN
  Administered 2023-05-19: 30 mg via INTRAVENOUS
  Administered 2023-05-19 (×3): 20 mg via INTRAVENOUS

## 2023-05-19 SURGICAL SUPPLY — 25 items

## 2023-05-19 NOTE — Transfer of Care (Signed)
Immediate Anesthesia Transfer of Care Note  Patient: Lisa [name], Dr.  Nigel Bridgeman) Performed: COLONOSCOPY WITH PROPOFOL ESOPHAGOGASTRODUODENOSCOPY (EGD) WITH PROPOFOL BIOPSY POLYPECTOMY  Patient Location: Endoscopy Unit  Anesthesia Type:MAC  Level of Consciousness: drowsy  Airway & Oxygen Therapy: Patient Spontanous Breathing and Patient connected to face mask oxygen  Post-op Assessment: Report given to RN and Post -op Vital signs reviewed and stable  Post vital signs: Reviewed and stable  Last Vitals:  Vitals Value Taken Time  BP    Temp    Pulse 66 05/19/23 1255  Resp 21 05/19/23 1255  SpO2 100 % 05/19/23 1255  Vitals shown include unvalidated device data.  Last Pain:  Vitals:   05/19/23 1005  TempSrc: Temporal  PainSc: 0-No pain         Complications: No notable events documented.

## 2023-05-19 NOTE — Discharge Instructions (Signed)

## 2023-05-19 NOTE — Op Note (Signed)
[facility] Patient Name: Lisa [name] Procedure Date: 05/19/2023 MRN: [id number] Attending MD: Liliane Shi DO, DO, [id number] Date of Birth: Jul 12, 1941 CSN: [id number] Age: 82 Admit Type: Outpatient Procedure:                Colonoscopy Indications:              Iron deficiency anemia, Positive Cologuard test Providers:                Liliane Shi DO, DO, Marge Duncans, RN,                            Martha Clan, RN, Beryle Beams, Technician Referring MD:              Medicines:                See the Anesthesia note for documentation of the                            administered medications Complications:            No immediate complications. Estimated Blood Loss:     Estimated blood loss was minimal. Procedure:                Pre-Anesthesia Assessment:                           - ASA Grade Assessment: III - A patient with severe                            systemic disease.                           - The risks and benefits of the procedure and the                            sedation options and risks were discussed with the                            patient. All questions were answered and informed                            consent was obtained.                           After obtaining informed consent, the colonoscope                            was passed under direct vision. Throughout the                            procedure, the patient's blood pressure, pulse, and                            oxygen saturations were monitored continuously. The                            CF-HQ190L ([id number] Olympus  colonoscope was                            introduced through the anus and advanced to the the                            terminal ileum, with identification of the                            appendiceal orifice and IC valve. The colonoscopy                            was technically difficult and complex due to                             significant looping. Successful completion of the                            procedure was aided by using manual pressure,                            straightening and shortening the scope to obtain                            bowel loop reduction and using scope torsion. The                            patient tolerated the procedure well. The quality                            of the bowel preparation was evaluated using the                            BBPS [facility] Bowel Preparation Scale) with scores                            of: Right Colon = 2 (minor amount of residual                            staining, small fragments of stool and/or opaque                            liquid, but mucosa seen well), Transverse Colon = 3                            (entire mucosa seen well with no residual staining,                            small fragments of stool or opaque liquid) and Left                            Colon = 3 (entire mucosa seen well with no residual  staining, small fragments of stool or opaque                            liquid). The total BBPS score equals 8. The quality                            of the bowel preparation was good. Scope In: 12:11:16 PM Scope Out: 12:48:03 PM Scope Withdrawal Time: 0 hours 22 minutes 28 seconds  Total Procedure Duration: 0 hours 36 minutes 47 seconds  Findings:      The digital rectal exam findings include non-thrombosed external       hemorrhoids and internal hemorrhoids (Grade I).      A diminutive polyp was found in the ascending colon. The polyp was       sessile. The polyp was removed with a cold biopsy forceps. Resection and       retrieval were complete.      A 5 mm polyp was found in the descending colon. The polyp was sessile.       The polyp was removed with a cold snare. Resection and retrieval were       complete.      A diminutive polyp was found in the descending colon. The polyp was       sessile. The  polyp was removed with a cold biopsy forceps. Resection and       retrieval were complete.      A diminutive polyp was found in the sigmoid colon. The polyp was       sessile. The polyp was removed with a cold biopsy forceps. Resection and       retrieval were complete.      The terminal ileum appeared normal.      Multiple medium-mouthed and small-mouthed diverticula were found in the       sigmoid colon and descending colon.      There was a small lipoma, in the sigmoid colon. Impression:               - Non-thrombosed external hemorrhoids and internal                            hemorrhoids (Grade I) found on digital rectal exam.                           - One diminutive polyp in the ascending colon,                            removed with a cold biopsy forceps. Resected and                            retrieved.                           - One 5 mm polyp in the descending colon, removed                            with a cold snare. Resected and retrieved.                           -  One diminutive polyp in the descending colon,                            removed with a cold biopsy forceps. Resected and                            retrieved.                           - One diminutive polyp in the sigmoid colon,                            removed with a cold biopsy forceps. Resected and                            retrieved.                           - The examined portion of the ileum was normal.                           - Diverticulosis in the sigmoid colon and in the                            descending colon.                           - Small lipoma in the sigmoid colon. Moderate Sedation:      See the other procedure note for documentation of moderate sedation with       intraservice time. Recommendation:           - Discharge patient to home.                           - High fiber diet.                           - Continue present medications.                           - Await  pathology results.                           - Repeat colonoscopy date to be determined after                            pending pathology results are reviewed for                            surveillance.                           - Return to GI office in 3 months. Procedure Code(s):        --- Professional ---                           [phone],  Colonoscopy, flexible; with removal of                            tumor(s), polyp(s), or other lesion(s) by snare                            technique                           45380, 59, Colonoscopy, flexible; with biopsy,                            single or multiple Diagnosis Code(s):        --- Professional ---                           D12.4, Benign neoplasm of descending colon                           D12.5, Benign neoplasm of sigmoid colon                           D12.2, Benign neoplasm of ascending colon                           K64.0, First degree hemorrhoids                           D50.9, Iron deficiency anemia, unspecified                           K64.4, Residual hemorrhoidal skin tags                           R19.5, Other fecal abnormalities                           K57.30, Diverticulosis of large intestine without                            perforation or abscess without bleeding CPT copyright 2022 American [employer] Association. All rights reserved. The codes documented in this report are preliminary and upon coder review may  be revised to meet current compliance requirements. Dr Liliane Shi, DO Liliane Shi DO, DO 05/19/2023 1:01:03 PM Number of Addenda: 0

## 2023-05-19 NOTE — Op Note (Signed)
[facility] Patient Name: Lisa [name] Procedure Date: 05/19/2023 MRN: [id number] Attending MD: Liliane Shi DO, DO, [id number] Date of Birth: 11/11/1941 CSN: [id number] Age: 82 Admit Type: Outpatient Procedure:                Upper GI endoscopy Indications:              Iron deficiency anemia Providers:                Liliane Shi DO, DO, Marge Duncans, RN, Beryle Beams, Technician, Priscella Mann, Technician,                            Leroy Libman, CRNA Referring MD:              Medicines:                See the Anesthesia note for documentation of the                            administered medications Complications:            No immediate complications. Estimated Blood Loss:     Estimated blood loss was minimal. Procedure:                Pre-Anesthesia Assessment:                           - ASA Grade Assessment: III - A patient with severe                            systemic disease.                           - The risks and benefits of the procedure and the                            sedation options and risks were discussed with the                            patient. All questions were answered and informed                            consent was obtained.                           After obtaining informed consent, the endoscope was                            passed under direct vision. Throughout the                            procedure, the patient's blood pressure, pulse, and                            oxygen saturations were monitored continuously. The  [id number]-[id number] ([id number]) Olympus endoscope was introduced                            through the mouth, and advanced to the second part                            of duodenum. The upper GI endoscopy was                            accomplished without difficulty. The patient                            tolerated the procedure well. Scope In: Scope  Out: Findings:      Esophageal plaque mucosal changes characterized by altered texture were       found in the lower third of the esophagus. Biopsies were taken with a       cold forceps for histology.      Localized mild inflammation characterized by congestion (edema) was       found in the gastric antrum. Biopsies were taken with a cold forceps for       Helicobacter pylori testing.      The examined duodenum was normal. Impression:               - Texture changed mucosa in the esophagus. Biopsied.                           - Gastritis. Biopsied.                           - Normal examined duodenum. Moderate Sedation:      See the other procedure note for documentation of moderate sedation with       intraservice time. Recommendation:           - Await pathology results.                           - Continue present medications.                           - Perform a colonoscopy today. Procedure Code(s):        --- Professional ---                           [phone], Esophagogastroduodenoscopy, flexible,                            transoral; with biopsy, single or multiple Diagnosis Code(s):        --- Professional ---                           K22.89, Other specified disease of esophagus                           D50.9, Iron deficiency anemia, unspecified                           K29.70, Gastritis,  unspecified, without bleeding CPT copyright 2022 American Medical Association. All rights reserved. The codes documented in this report are preliminary and upon coder review may  be revised to meet current compliance requirements. Dr Liliane Shi, DO Liliane Shi DO, DO 05/19/2023 1:04:43 PM Number of Addenda: 0

## 2023-05-19 NOTE — Anesthesia Preprocedure Evaluation (Addendum)
Anesthesia Evaluation  Patient identified by MRN, date of birth, ID band Patient awake    Reviewed: Allergy & Precautions, NPO status , Patient's Chart, lab work & pertinent test results  History of Anesthesia Complications (+) PONV and history of anesthetic complications  Airway Mallampati: III  TM Distance: >3 FB Neck ROM: Full   Comment: Previous grade I view with Miller 2, mask with OPA Dental  (+) Dental Advisory Given   Pulmonary neg shortness of breath, asthma , sleep apnea and Continuous Positive Airway Pressure Ventilation , neg COPD, neg recent URI   Pulmonary exam normal breath sounds clear to auscultation       Cardiovascular hypertension (metoprolol), Pt. 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 Left ventricular ejection fraction, by estimation, is 55 to 60%. The  left ventricle has normal function. The left ventricle has no regional  wall motion abnormalities. Left ventricular diastolic parameters are  consistent with Grade I diastolic  dysfunction (impaired relaxation). The average left ventricular global  longitudinal strain is -16.1 %.   2. Right ventricular systolic function is normal. The right ventricular  size is normal. There is normal pulmonary artery systolic pressure. The  estimated right ventricular systolic pressure is 21.3 mmHg.   3. The mitral valve is normal in structure. Mild mitral valve  regurgitation. No evidence of mitral stenosis.   4. The aortic valve was not well visualized. Aortic valve regurgitation  is not visualized. Aortic valve sclerosis/calcification is present,  without any evidence of aortic stenosis.   5. The inferior vena cava is normal in size with greater than 50%  respiratory variability, suggesting right atrial pressure of 3 mmHg.      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; right lip droop), Residual Symptoms    GI/Hepatic Neg liver ROS,GERD  ,,  Endo/Other  negative endocrine ROS    Renal/GU CRFRenal disease     Musculoskeletal  (+) Arthritis , Osteoarthritis,  osteoporosis   Abdominal  (+) + obese  Peds  Hematology negative hematology ROS (+)   Anesthesia Other Findings   Reproductive/Obstetrics                             Anesthesia Physical Anesthesia Plan  ASA: 3  Anesthesia Plan: MAC   Post-op Pain Management: Minimal or no pain anticipated   Induction: Intravenous  PONV Risk Score and Plan: 3 and Propofol infusion and Treatment may vary due to age or medical condition  Airway Management Planned: Natural Airway and Nasal Cannula  Additional Equipment:   Intra-op Plan:   Post-operative Plan:   Informed Consent: I have reviewed the patients History and Physical, chart, labs and discussed the procedure including the risks, benefits and alternatives for the proposed anesthesia with the patient or authorized representative who has indicated his/her understanding and acceptance.     Dental advisory given  Plan Discussed with: CRNA and Anesthesiologist  Anesthesia Plan Comments: (Discussed with patient risks of MAC including, but not limited to, minor pain or discomfort, hearing people in the room, and possible need for backup general anesthesia. Risks for general anesthesia also discussed including, but not limited to, sore throat, hoarse voice, chipped/damaged teeth, injury to vocal cords, nausea and vomiting, allergic reactions,  lung infection, heart attack, stroke, and death. All questions answered. )        Anesthesia Quick Evaluation

## 2023-05-19 NOTE — H&P (Signed)
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 No current blood per rectum. No chest pain or shortness of breath. No anticoagulant use. Metal in lumbar spine and sternum. History of hysterectomy and bladder sling. No family history colon cancer. No prior EGD.  Objective: General: Awake and alert, non-toxic in appearance Cardio: Regular rate and rhythm  Pulm: Clear to auscultation, no conversational dyspnea Abdomen: Soft, non-tender to palpation, bowel sounds appreciated    Assessment:  Iron deficiency anemia Positive Cologuard  Plan:  -Proceed to EGD and colonoscopy today, discussed procedures in detail including benefits, alternatives and risks of bleeding/infection/perforation/missed lesion/anesthesia, she verbalized understanding and elected to proceed -Further recommendations to follow pending procedures  Liliane Shi, DO [facility]

## 2023-05-19 NOTE — Anesthesia Postprocedure Evaluation (Signed)
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) Performed: COLONOSCOPY WITH PROPOFOL ESOPHAGOGASTRODUODENOSCOPY (EGD) WITH PROPOFOL BIOPSY POLYPECTOMY     Patient location during evaluation: PACU Anesthesia Type: MAC Level of consciousness: awake Pain management: pain level controlled Vital Signs Assessment: post-procedure vital signs reviewed and stable Respiratory status: spontaneous breathing, nonlabored ventilation and respiratory function stable Cardiovascular status: stable and blood pressure returned to baseline Postop Assessment: no apparent nausea or vomiting Anesthetic complications: no   No notable events documented.  Last Vitals:  Vitals:   05/19/23 1255 05/19/23 1310  BP: (!) 163/80 (!) 174/58  Pulse: 66 (!) 59  Resp: (!) 21 18  Temp: (!) 36.3 C   SpO2: 100% 98%    Last Pain:  Vitals:   05/19/23 1310  TempSrc:   PainSc: 0-No pain                 Linton Rump

## 2023-05-20 LAB — SURGICAL PATHOLOGY

## 2023-05-21 DIAGNOSIS — M5416 Radiculopathy, lumbar region: Secondary | ICD-10-CM | POA: Diagnosis not present

## 2023-05-24 ENCOUNTER — Encounter (HOSPITAL_COMMUNITY): Payer: Self-pay | Admitting: Internal Medicine

## 2023-06-01 DIAGNOSIS — M5416 Radiculopathy, lumbar region: Secondary | ICD-10-CM | POA: Diagnosis not present

## 2023-06-05 ENCOUNTER — Encounter: Payer: Self-pay | Admitting: Cardiology

## 2023-06-05 ENCOUNTER — Ambulatory Visit: Payer: Medicare Other | Attending: Cardiology | Admitting: Cardiology

## 2023-06-05 VITALS — BP 132/70 | HR 58 | Ht 62.0 in | Wt 183.6 lb

## 2023-06-05 DIAGNOSIS — Z01810 Encounter for preprocedural cardiovascular examination: Secondary | ICD-10-CM

## 2023-06-05 DIAGNOSIS — I10 Essential (primary) hypertension: Secondary | ICD-10-CM | POA: Diagnosis not present

## 2023-06-05 DIAGNOSIS — Z951 Presence of aortocoronary bypass graft: Secondary | ICD-10-CM | POA: Diagnosis not present

## 2023-06-05 NOTE — Progress Notes (Signed)
Cardiology Office Note:    Date:  06/05/2023   ID:  Lisa [name], Dr., DOB 16-Jun-1941, MRN [id number]  PCP:  Thana Ates, MD  [facility] Cardiologist:  Debbe Odea, MD  [facility] Electrophysiologist:  None   Referring MD: Thana Ates, MD   Chief Complaint  Patient presents with   Follow-up    Patient would like to discuss cardiac clearance for back surgery.  Was seen by surgeon a couple weeks ago that stated she will probably need surgery.    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  Patient is feeling well, denies any chest pain or shortness of breath, complains of lower back pain due to degenerative disc disease.  Has been getting pain injection for some time now, but these do not seem to have any effect.  Plans to follow-up with surgeon for eval and possible surgical management.  Compliant with medications as prescribed.  Blood pressure machine at home is old, seems to give higher numbers than what she has in the office.  Prior notes Echo 08/2022 showed normal systolic function EF 55 to 60%, aortic valve sclerosis.  Echocardiogram 10/2020 EF 55 to 60%, Postop A-fib. Cardiac monitor 12/2020 1 run of paroxysmal SVT, no A-fib or flutter noted  Past Medical History:  Diagnosis Date   Acne rosacea    Allergic rhinitis    Chronic kidney disease    kidney stones, Nov. 2013- tx with Lithotripsy, still has some present    Cough variant asthma    Degenerative disc disease    knees, ankles, back- OA   GERD (gastroesophageal reflux disease)    Headache(784.0)    since stopping aleve- 07/30/2013   Hyperlipemia    dyslipidemia   Hypertension    [facility] cardiac pharmacist follows pt. Riki Rusk Smart ,has never taken anti-hypertensive   Osteoarthritis    Plantar fasciitis    (using  orthotics-they are wearing out)- Dr Luther Bradley   Pneumonia    double pneumonia - relative to sinus infection , had chemical cauterization    PONV (postoperative nausea and vomiting)    urgency, N&V for days, memory & processing   Sleep apnea    Bringing cpap mask and tubing, study last done- 2006    Past Surgical History:  Procedure Laterality Date   ABDOMINAL HYSTERECTOMY  1990's   BIOPSY  05/19/2023   Procedure: BIOPSY;  Surgeon: Lynann Bologna, DO;  Location: [facility];  Service: Gastroenterology;;   BLADDER REPAIR     BLADDER SUSPENSION  90's   COLONOSCOPY WITH PROPOFOL N/A 05/19/2023   Procedure: COLONOSCOPY WITH PROPOFOL;  Surgeon: Lynann Bologna, DO;  Location: [facility];  Service: Gastroenterology;  Laterality: N/A;   CORONARY ARTERY BYPASS GRAFT N/A 08/26/2022   Procedure: CORONARY ARTERY BYPASS GRAFTING (CABG) X THREE, USING LEFT INTERNAL MAMMARY ARTERY AND RIGHT LEG GREATER SAPHENOUS VEIN HARVESTED ENDOSCOPICALLY;  Surgeon: Lovett Sox, MD;  Location: [facility] OR;  Service: Open Heart Surgery;  Laterality: N/A;   ESOPHAGOGASTRODUODENOSCOPY (EGD) WITH PROPOFOL N/A 05/19/2023   Procedure: ESOPHAGOGASTRODUODENOSCOPY (EGD) WITH PROPOFOL;  Surgeon: Lynann Bologna, DO;  Location: [facility];  Service: Gastroenterology;  Laterality: N/A;   LEFT HEART CATH AND CORONARY ANGIOGRAPHY N/A 08/20/2022   Procedure: LEFT HEART CATH AND CORONARY ANGIOGRAPHY;  Surgeon: Iran Ouch, MD;  Location: [facility]VAS[facility] CV LAB;  Service: Cardiovascular;  Laterality: N/A;   MAXIMUM ACCESS (MAS)POSTERIOR LUMBAR INTERBODY FUSION (PLIF) 2 LEVEL N/A 08/12/2013   Procedure: FOR MAXIMUM ACCESS (MAS) POSTERIOR LUMBAR INTERBODY FUSION (PLIF) 2 LEVEL;  Surgeon: Tia Alert, MD;  Location: [facility];  Service: Neurosurgery;  Laterality: N/A;  FOR MAXIMUM ACCESS (MAS) POSTERIOR LUMBAR INTERBODY FUSION (PLIF) 2 LEVEL   MULTIPLE TOOTH EXTRACTIONS     wisdom teeth extractions    POLYPECTOMY  05/19/2023    Procedure: POLYPECTOMY;  Surgeon: Lynann Bologna, DO;  Location: [facility];  Service: Gastroenterology;;   POSTERIOR FUSION LUMBAR SPINE  08/12/2013   Dr Yetta Barre   TEE WITHOUT CARDIOVERSION N/A 08/26/2022   Procedure: TRANSESOPHAGEAL ECHOCARDIOGRAM (TEE);  Surgeon: Lovett Sox, MD;  Location: [facility] OR;  Service: Open Heart Surgery;  Laterality: N/A;   TONSILLECTOMY      Current Medications: Current Meds  Medication Sig   acetaminophen (TYLENOL) 500 MG tablet Take 1-2 tablets (500-1,000 mg total) by mouth every 6 (six) hours as needed for mild pain or fever. 1000 mg in the morning and at night  Additional 1000 mg daily at mid day if needed. (Patient taking differently: Take 1,000 mg by mouth 2 (two) times daily. Additional 1000 mg daily at mid day if needed.)   alendronate (FOSAMAX) 70 MG tablet Take 70 mg by mouth once a week. Take with a full glass of water on an empty stomach.  Saturday Morning   APPLE CIDER VINEGAR PO Take 1 tablet by mouth in the morning. Gummy   aspirin EC 81 MG tablet Take 1 tablet (81 mg total) by mouth daily.   atorvastatin (LIPITOR) 80 MG tablet Take 1 tablet (80 mg total) by mouth at bedtime. (Patient taking differently: Take 40 mg by mouth at bedtime.)   Cholecalciferol (VITAMIN D-3) 125 MCG (5000 UT) TABS Take 5,000 Units by mouth daily.   Cobalamin Combinations (NEURIVA PLUS PO) Take 1 capsule by mouth daily.   fenofibrate (TRICOR) 145 MG tablet Take 145 mg by mouth daily.   ferrous sulfate 324 MG TBEC Take 324 mg by mouth every other day.   fluconazole (DIFLUCAN) 200 MG tablet Take 200 mg by mouth once. For 14 days starting 06/02/23   fluticasone (FLONASE) 50 MCG/ACT nasal spray Place 2 sprays into the nose daily. Allergic rhinitis.   LIDOCAINE-MENTHOL, SPRAY, EX Apply 1 Application topically daily as needed (Back pain).   Magnesium 400 MG TABS Take 400 mg by mouth at bedtime.   metoprolol tartrate (LOPRESSOR) 25 MG tablet Take 0.5 tablets (12.5 mg total)  by mouth 2 (two) times daily.   Multiple Vitamin (MULTIVITAMIN WITH MINERALS) TABS Take 1 tablet by mouth daily. Centrum  Woman 50+   Multiple Vitamins-Minerals (EMERGEN-C IMMUNE PLUS/VIT D PO) Take 750 mg by mouth daily. Gummy   MYRBETRIQ 50 MG TB24 tablet Take 50 mg by mouth at bedtime.   Olopatadine HCl (PATADAY) 0.7 % SOLN Place 1 drop into both eyes daily.   OVER THE COUNTER MEDICATION Take 1 tablet by mouth daily. Nervive   Polyethyl Glycol-Propyl Glycol (SYSTANE OP) Place 1 drop into both eyes daily as needed (Dry eyes).   TURMERIC PO Take 1,000 mg by mouth 2 (two) times daily.   zinc gluconate 50 MG tablet Take 50 mg by mouth daily.     Allergies:   Other, Oxycodone, Sulfa antibiotics, and Sulfasalazine   Social History   Socioeconomic History  Marital status: Divorced    Spouse name: Not on file   Number of children: Not on file   Years of education: Not on file   Highest education level: Not on file  Occupational History   Occupation: retired  Tobacco Use   Smoking status: Never   Smokeless tobacco: Never  Substance and Sexual Activity   Alcohol use: Yes    Comment: on occassion   Drug use: No   Sexual activity: Not on file  Other Topics Concern   Not on file  Social History Narrative   Lives alone   Left Handed   Drinks 2-3 cups caffeine daily   Social Determinants of Health   Financial Resource Strain: Not on file  Food Insecurity: No Food Insecurity (08/28/2022)   Hunger Vital Sign    Worried About Running Out of Food in the Last Year: Never true    Ran Out of Food in the Last Year: Never true  Transportation Needs: No Transportation Needs (08/28/2022)   PRAPARE - Administrator, Civil Service (Medical): No    Lack of Transportation (Non-Medical): No  Physical Activity: Not on file  Stress: Not on file  Social Connections: Not on file     Family History: The patient's family history includes CVA in her father; Heart Problems in her father  and mother; Heart attack in her father; Heart disease in her father.  ROS:   Please see the history of present illness.     All other systems reviewed and are negative.  EKGs/Labs/Other Studies Reviewed:    The following studies were reviewed today:   EKG:  EKG is ordered today.  EKG shows sinus bradycardia, otherwise normal ECG, heart rate 50  Recent Labs: 08/19/2022: ALT 19 08/30/2022: Hemoglobin 8.7; Platelets 164; TSH 3.164 08/31/2022: Magnesium 2.4 09/01/2022: BUN 38; Creatinine, Ser 1.32; Potassium 4.1; Sodium 139  Recent Lipid Panel    Component Value Date/Time   CHOL 171 08/19/2022 0957   TRIG 160 (H) 08/19/2022 0957   HDL 67 08/19/2022 0957   CHOLHDL 2.6 08/19/2022 0957   VLDL 32 08/19/2022 0957   LDLCALC 72 08/19/2022 0957   LDLDIRECT 92 08/19/2022 0957     Risk Assessment/Calculations:      Physical Exam:    VS:  BP 132/70 (BP Location: Left Arm, Patient Position: Sitting, Cuff Size: Normal)   Pulse (!) 58   Ht 5\' 2"  (1.575 m)   Wt 183 lb 9.6 oz (83.3 kg)   SpO2 97%   BMI 33.58 kg/m     Wt Readings from Last 3 Encounters:  06/05/23 183 lb 9.6 oz (83.3 kg)  05/19/23 176 lb (79.8 kg)  12/04/22 178 lb 2 oz (80.8 kg)     GEN:  Well nourished, well developed in no acute distress HEENT: Normal NECK: No JVD; No carotid bruits CARDIAC: RRR, 2/6 systolic murmur RESPIRATORY:  Clear to auscultation without rales, wheezing or rhonchi  ABDOMEN: Soft, non-tender, non-distended MUSCULOSKELETAL:  No edema; sternal surgical scar noted SKIN: Warm and dry NEUROLOGIC:  Alert and oriented x 3 PSYCHIATRIC:  Normal affect   ASSESSMENT:    1. S/P CABG x 3   2. Primary hypertension   3. Pre-procedural cardiovascular examination    PLAN:    In order of problems listed above:  CAD/CABG x3 on 9/23.  Denies chest pain.  Echo 08/2022 EF 55 to 60%.  Continue aspirin 81 mg daily, Lipitor 80, fenofibrate.   Hypertension, BP controlled.  Continue  Lopressor 12.5 mg twice  daily. Preprocedural exam, back surgery being planned.  Okay for back surgery from a cardiac perspective.  Last echo with normal EF, patient denies chest pain.  Follow-up in 12 months.   Medication Adjustments/Labs and Tests Ordered: Current medicines are reviewed at length with the patient today.  Concerns regarding medicines are outlined above.  Orders Placed This Encounter  Procedures   EKG 12-Lead   No orders of the defined types were placed in this encounter.   Patient Instructions  Medication Instructions:   Your physician recommends that you continue on your current medications as directed. Please refer to the Current Medication list given to you today.  *If you need a refill on your cardiac medications before your next appointment, please call your pharmacy*   Lab Work:  None Ordered  If you have labs (blood work) drawn today and your tests are completely normal, you will receive your results only by: MyChart Message (if you have MyChart) OR A paper copy in the mail If you have any lab test that is abnormal or we need to change your treatment, we will call you to review the results.   Testing/Procedures:  None Ordered   Follow-Up: At [facility], you and your health needs are our priority.  As part of our continuing mission to provide you with exceptional heart care, we have created designated Provider Care Teams.  These Care Teams include your primary Cardiologist (physician) and Advanced Practice Providers (APPs -  Physician Assistants and Nurse Practitioners) who all work together to provide you with the care you need, when you need it.  We recommend signing up for the patient portal called "MyChart".  Sign up information is provided on this After Visit Summary.  MyChart is used to connect with patients for Virtual Visits (Telemedicine).  Patients are able to view lab/test results, encounter notes, upcoming appointments, etc.  Non-urgent messages can be  sent to your provider as well.   To learn more about what you can do with MyChart, go to [email or web address].    Your next appointment:   12 month(s)  Provider:   You may see Debbe Odea, MD or one of the following Advanced Practice Providers on your designated Care Team:   Nicolasa Ducking, NP Eula Listen, PA-C Cadence Fransico Michael, PA-C Charlsie Quest, NP   Signed, Debbe Odea, MD  06/05/2023 12:32 PM    [facility]

## 2023-06-05 NOTE — Patient Instructions (Signed)
Medication Instructions:   Your physician recommends that you continue on your current medications as directed. Please refer to the Current Medication list given to you today.  *If you need a refill on your cardiac medications before your next appointment, please call your pharmacy*   Lab Work:  None Ordered  If you have labs (blood work) drawn today and your tests are completely normal, you will receive your results only by: MyChart Message (if you have MyChart) OR A paper copy in the mail If you have any lab test that is abnormal or we need to change your treatment, we will call you to review the results.   Testing/Procedures:  None Ordered    Follow-Up: At [facility], you and your health needs are our priority.  As part of our continuing mission to provide you with exceptional heart care, we have created designated Provider Care Teams.  These Care Teams include your primary Cardiologist (physician) and Advanced Practice Providers (APPs -  Physician Assistants and Nurse Practitioners) who all work together to provide you with the care you need, when you need it.  We recommend signing up for the patient portal called "MyChart".  Sign up information is provided on this After Visit Summary.  MyChart is used to connect with patients for Virtual Visits (Telemedicine).  Patients are able to view lab/test results, encounter notes, upcoming appointments, etc.  Non-urgent messages can be sent to your provider as well.   To learn more about what you can do with MyChart, go to [email or web address].    Your next appointment:   12 month(s)  Provider:   You may see Brian Agbor-Etang, MD or one of the following Advanced Practice Providers on your designated Care Team:   Christopher Berge, NP Ryan Dunn, PA-C Cadence Furth, PA-C Sheri Hammock, NP  

## 2023-06-09 ENCOUNTER — Other Ambulatory Visit: Payer: Self-pay | Admitting: Student

## 2023-06-09 DIAGNOSIS — M5416 Radiculopathy, lumbar region: Secondary | ICD-10-CM

## 2023-06-29 DIAGNOSIS — M545 Low back pain, unspecified: Secondary | ICD-10-CM | POA: Diagnosis not present

## 2023-06-29 DIAGNOSIS — G8929 Other chronic pain: Secondary | ICD-10-CM | POA: Diagnosis not present

## 2023-06-29 DIAGNOSIS — D509 Iron deficiency anemia, unspecified: Secondary | ICD-10-CM | POA: Diagnosis not present

## 2023-06-29 DIAGNOSIS — R21 Rash and other nonspecific skin eruption: Secondary | ICD-10-CM | POA: Diagnosis not present

## 2023-06-29 DIAGNOSIS — E785 Hyperlipidemia, unspecified: Secondary | ICD-10-CM | POA: Diagnosis not present

## 2023-06-29 DIAGNOSIS — N1832 Chronic kidney disease, stage 3b: Secondary | ICD-10-CM | POA: Diagnosis not present

## 2023-06-29 DIAGNOSIS — I2510 Atherosclerotic heart disease of native coronary artery without angina pectoris: Secondary | ICD-10-CM | POA: Diagnosis not present

## 2023-07-03 ENCOUNTER — Ambulatory Visit
Admission: RE | Admit: 2023-07-03 | Discharge: 2023-07-03 | Disposition: A | Discharge status: Home / Self Care | Payer: Medicare Other | Source: Ambulatory Visit | Attending: Student | Admitting: Student

## 2023-07-03 DIAGNOSIS — M5416 Radiculopathy, lumbar region: Secondary | ICD-10-CM | POA: Diagnosis not present

## 2023-07-03 DIAGNOSIS — Z981 Arthrodesis status: Secondary | ICD-10-CM | POA: Diagnosis not present

## 2023-07-03 MED ORDER — MEPERIDINE HCL 50 MG/ML IJ SOLN: 50.0000 mg | Freq: Once | INTRAMUSCULAR | Status: DC | PRN

## 2023-07-03 MED ORDER — IOPAMIDOL (ISOVUE-M 200) INJECTION 41%
15.0000 mL | Freq: Once | INTRAMUSCULAR | Status: AC
  Administered 2023-07-03: 15 mL via INTRATHECAL

## 2023-07-03 MED ORDER — ONDANSETRON HCL 4 MG/2ML IJ SOLN: 4.0000 mg | Freq: Once | INTRAMUSCULAR | Status: DC | PRN

## 2023-07-03 MED ORDER — DIAZEPAM 5 MG PO TABS
5.0000 mg | ORAL_TABLET | Freq: Once | ORAL | Status: AC
  Administered 2023-07-03: 5 mg via ORAL

## 2023-07-03 NOTE — Discharge Instructions (Signed)

## 2023-07-16 ENCOUNTER — Telehealth: Payer: Self-pay

## 2023-07-16 DIAGNOSIS — M48062 Spinal stenosis, lumbar region with neurogenic claudication: Secondary | ICD-10-CM | POA: Diagnosis not present

## 2023-07-16 NOTE — Telephone Encounter (Signed)
   Pre-operative Risk Assessment    Patient Name: Lisa [name], Dr.  Park Meo: 12-26-1940 MRN: [id number]      Request for Surgical Clearance    Procedure:   L1-3 Laminectomy with Posterolateral Fusion  Date of Surgery:  Clearance TBD                                 Surgeon:  Dr. Debbe Odea Surgeon's Group or Practice Name:  [facility] Phone number:  [phone] Fax number:  [phone]   Type of Clearance Requested:   - Medical  - Pharmacy:  Hold Aspirin ; patient on Aspirin, hold not requested   Type of Anesthesia:  General    Additional requests/questions:    Garrel Ridgel   07/16/2023, 2:36 PM

## 2023-07-16 NOTE — Telephone Encounter (Signed)
   Patient Name: Lisa [name], Dr.  Park Meo: 07-12-1941 MRN: [id number]  Primary Cardiologist: Debbe Odea, MD  Chart reviewed as part of pre-operative protocol coverage. Given past medical history and time since last visit, based on ACC/AHA guidelines, Lisa [name], Dr. is at acceptable risk for the planned procedure without further cardiovascular testing.   Per Dr. Azucena Cecil:  Preprocedural exam, back surgery being planned. Okay for back surgery from a cardiac perspective. Last echo with normal EF, patient denies chest pain.   Per office protocol, if patient is without any new symptoms or concerns at the time of their virtual visit, she may hold ASA for 7 days days prior to procedure. Please resume ASA as soon as possible postprocedure, at the discretion of the surgeon.    The patient was advised that if she develops new symptoms prior to surgery to contact our office to arrange for a follow-up visit, and she verbalized understanding.  I will route this recommendation to the requesting party via Epic fax function and remove from pre-op pool.  Please call with questions.  Joni Reining, NP 07/16/2023, 4:12 PM

## 2023-07-20 DIAGNOSIS — G4733 Obstructive sleep apnea (adult) (pediatric): Secondary | ICD-10-CM | POA: Diagnosis not present

## 2023-08-07 DIAGNOSIS — D509 Iron deficiency anemia, unspecified: Secondary | ICD-10-CM | POA: Diagnosis not present

## 2023-08-17 DIAGNOSIS — H02831 Dermatochalasis of right upper eyelid: Secondary | ICD-10-CM | POA: Diagnosis not present

## 2023-08-17 DIAGNOSIS — H0102A Squamous blepharitis right eye, upper and lower eyelids: Secondary | ICD-10-CM | POA: Diagnosis not present

## 2023-08-17 DIAGNOSIS — H02834 Dermatochalasis of left upper eyelid: Secondary | ICD-10-CM | POA: Diagnosis not present

## 2023-08-17 DIAGNOSIS — H43813 Vitreous degeneration, bilateral: Secondary | ICD-10-CM | POA: Diagnosis not present

## 2023-08-17 DIAGNOSIS — H1045 Other chronic allergic conjunctivitis: Secondary | ICD-10-CM | POA: Diagnosis not present

## 2023-08-17 DIAGNOSIS — H04123 Dry eye syndrome of bilateral lacrimal glands: Secondary | ICD-10-CM | POA: Diagnosis not present

## 2023-08-17 DIAGNOSIS — G4730 Sleep apnea, unspecified: Secondary | ICD-10-CM | POA: Diagnosis not present

## 2023-08-17 DIAGNOSIS — H0102B Squamous blepharitis left eye, upper and lower eyelids: Secondary | ICD-10-CM | POA: Diagnosis not present

## 2023-08-25 DIAGNOSIS — M25572 Pain in left ankle and joints of left foot: Secondary | ICD-10-CM | POA: Diagnosis not present

## 2023-08-31 ENCOUNTER — Other Ambulatory Visit: Payer: Self-pay | Admitting: Neurological Surgery

## 2023-09-16 ENCOUNTER — Ambulatory Visit (INDEPENDENT_AMBULATORY_CARE_PROVIDER_SITE_OTHER): Payer: Medicare Other

## 2023-09-16 ENCOUNTER — Encounter: Payer: Self-pay | Admitting: Podiatry

## 2023-09-16 ENCOUNTER — Other Ambulatory Visit: Payer: Self-pay | Admitting: Podiatry

## 2023-09-16 ENCOUNTER — Ambulatory Visit (INDEPENDENT_AMBULATORY_CARE_PROVIDER_SITE_OTHER): Payer: Medicare Other | Admitting: Podiatry

## 2023-09-16 DIAGNOSIS — M722 Plantar fascial fibromatosis: Secondary | ICD-10-CM

## 2023-09-16 DIAGNOSIS — M76822 Posterior tibial tendinitis, left leg: Secondary | ICD-10-CM

## 2023-09-16 DIAGNOSIS — M7752 Other enthesopathy of left foot: Secondary | ICD-10-CM

## 2023-09-16 DIAGNOSIS — M778 Other enthesopathies, not elsewhere classified: Secondary | ICD-10-CM

## 2023-09-16 MED ORDER — TRIAMCINOLONE ACETONIDE 40 MG/ML IJ SUSP
20.0000 mg | Freq: Once | INTRAMUSCULAR | Status: AC
Start: 2023-09-16 — End: 2023-09-16
  Administered 2023-09-16: 20 mg

## 2023-09-16 MED ORDER — METHYLPREDNISOLONE 4 MG PO TBPK: ORAL_TABLET | ORAL | 0 refills | Status: DC

## 2023-09-16 NOTE — Progress Notes (Signed)
Subjective:  Patient ID: Lisa [name], Dr., female    DOB: 1941/09/01,  MRN: [id number] HPI Chief Complaint  Patient presents with   Ankle Pain    Medial ankle left - aching, swelling x several months, the past few weeks noticed increased pain and swelling, having to use her cane more when she goes out and on feet longer, takes tylenol daily for back issues and arthritis (3000mg /day), bought ankle brace, but hasn't worn it much yet   New Patient (Initial Visit)    Est pt 2019    82 y.o. female presents with the above complaint.   ROS: Denies fever chills nausea vomit muscle aches pains.  She relates a history of back pain and back surgery.  She states that she has a lot of problems with her right side.  Past Medical History:  Diagnosis Date   Acne rosacea    Allergic rhinitis    Chronic kidney disease    kidney stones, Nov. 2013- tx with Lithotripsy, still has some present    Cough variant asthma    Degenerative disc disease    knees, ankles, back- OA   GERD (gastroesophageal reflux disease)    Headache(784.0)    since stopping aleve- 07/30/2013   Hyperlipemia    dyslipidemia   Hypertension    [facility] pharmacist follows pt. Riki Rusk Smart ,has never taken anti-hypertensive   Osteoarthritis    Plantar fasciitis    (using orthotics-they are wearing out)- Dr Luther Bradley   Pneumonia    double pneumonia - relative to sinus infection , had chemical cauterization    PONV (postoperative nausea and vomiting)    urgency, N&V for days, memory & processing   Sleep apnea    Bringing cpap mask and tubing, study last done- 2006   Past Surgical History:  Procedure Laterality Date   ABDOMINAL HYSTERECTOMY  1990's   BIOPSY  05/19/2023   Procedure: BIOPSY;  Surgeon: Lynann Bologna, DO;  Location: [facility];  Service: Gastroenterology;;   BLADDER REPAIR     BLADDER SUSPENSION  90's   COLONOSCOPY WITH PROPOFOL N/A 05/19/2023   Procedure: COLONOSCOPY WITH PROPOFOL;  Surgeon:  Lynann Bologna, DO;  Location: [facility];  Service: Gastroenterology;  Laterality: N/A;   CORONARY ARTERY BYPASS GRAFT N/A 08/26/2022   Procedure: CORONARY ARTERY BYPASS GRAFTING (CABG) X THREE, USING LEFT INTERNAL MAMMARY ARTERY AND RIGHT LEG GREATER SAPHENOUS VEIN HARVESTED ENDOSCOPICALLY;  Surgeon: Lovett Sox, MD;  Location: [facility] OR;  Service: Open Heart Surgery;  Laterality: N/A;   ESOPHAGOGASTRODUODENOSCOPY (EGD) WITH PROPOFOL N/A 05/19/2023   Procedure: ESOPHAGOGASTRODUODENOSCOPY (EGD) WITH PROPOFOL;  Surgeon: Lynann Bologna, DO;  Location: [facility];  Service: Gastroenterology;  Laterality: N/A;   LEFT HEART CATH AND CORONARY ANGIOGRAPHY N/A 08/20/2022   Procedure: LEFT HEART CATH AND CORONARY ANGIOGRAPHY;  Surgeon: Iran Ouch, MD;  Location: [facility];  Service: Cardiovascular;  Laterality: N/A;   MAXIMUM ACCESS (MAS)POSTERIOR LUMBAR INTERBODY FUSION (PLIF) 2 LEVEL N/A 08/12/2013   Procedure: FOR MAXIMUM ACCESS (MAS) POSTERIOR LUMBAR INTERBODY FUSION (PLIF) 2 LEVEL;  Surgeon: Tia Alert, MD;  Location: [facility];  Service: Neurosurgery;  Laterality: N/A;  FOR MAXIMUM ACCESS (MAS) POSTERIOR LUMBAR INTERBODY FUSION (PLIF) 2 LEVEL   MULTIPLE TOOTH EXTRACTIONS     wisdom teeth extractions    POLYPECTOMY  05/19/2023   Procedure: POLYPECTOMY;  Surgeon: Lynann Bologna, DO;  Location: [facility];  Service: Gastroenterology;;   POSTERIOR FUSION LUMBAR SPINE  08/12/2013   Dr Yetta Barre   TEE WITHOUT CARDIOVERSION N/A 08/26/2022   Procedure: TRANSESOPHAGEAL ECHOCARDIOGRAM (TEE);  Surgeon: Lovett Sox, MD;  Location: [facility] OR;  Service: Open Heart Surgery;  Laterality: N/A;   TONSILLECTOMY      Current Outpatient Medications:    clotrimazole-betamethasone (LOTRISONE) cream, 1 application Externally Twice a day for 14 days, Disp: , Rfl:    methylPREDNISolone (MEDROL DOSEPAK) 4 MG TBPK tablet, 6 day dose pack - take as directed, Disp: 21 tablet, Rfl: 0    acetaminophen (TYLENOL) 500 MG tablet, Take 1-2 tablets (500-1,000 mg total) by mouth every 6 (six) hours as needed for mild pain or fever. 1000 mg in the morning and at night  Additional 1000 mg daily at mid day if needed. (Patient taking differently: Take 1,000 mg by mouth 2 (two) times daily. Additional 1000 mg daily at mid day if needed.), Disp: 30 tablet, Rfl: 0   alendronate (FOSAMAX) 70 MG tablet, Take 70 mg by mouth once a week. Take with a full glass of water on an empty stomach.  Saturday Morning, Disp: , Rfl:    APPLE CIDER VINEGAR PO, Take 1 tablet by mouth in the morning. Gummy, Disp: , Rfl:    aspirin EC 81 MG tablet, Take 1 tablet (81 mg total) by mouth daily., Disp: , Rfl:    atorvastatin (LIPITOR) 80 MG tablet, Take 1 tablet (80 mg total) by mouth at bedtime. (Patient taking differently: Take 40 mg by mouth at bedtime.), Disp: , Rfl:    Cholecalciferol (VITAMIN D-3) 125 MCG (5000 UT) TABS, Take 5,000 Units by mouth daily., Disp: , Rfl:    Cobalamin Combinations (NEURIVA PLUS PO), Take 1 capsule by mouth daily., Disp: , Rfl:    fenofibrate (TRICOR) 145 MG tablet, Take 145 mg by mouth daily., Disp: , Rfl:    ferrous sulfate 324 MG TBEC, Take 324 mg by mouth every other day., Disp: , Rfl:    fluconazole (DIFLUCAN) 200 MG tablet, Take 200 mg by mouth once. For 14 days starting 06/02/23, Disp: , Rfl:    fluticasone (FLONASE) 50 MCG/ACT nasal spray, Place 2 sprays into the nose daily. Allergic rhinitis., Disp: , Rfl:    LIDOCAINE-MENTHOL, SPRAY, EX, Apply 1 Application topically daily as needed (Back pain)., Disp: , Rfl:    Magnesium 400 MG TABS, Take 400 mg by mouth at bedtime., Disp: , Rfl:    metoprolol tartrate (LOPRESSOR) 25 MG tablet, Take 0.5 tablets (12.5 mg total) by mouth 2 (two) times daily., Disp: 180 tablet, Rfl: 3   Multiple Vitamin (MULTIVITAMIN WITH MINERALS) TABS, Take 1 tablet by mouth daily. Centrum  Woman 50+, Disp: , Rfl:    Multiple Vitamins-Minerals (EMERGEN-C IMMUNE  PLUS/VIT D PO), Take 750 mg by mouth daily. Gummy, Disp: , Rfl:    MYRBETRIQ 50 MG TB24 tablet, Take 50 mg by mouth at bedtime., Disp: , Rfl:    Olopatadine HCl (PATADAY) 0.7 % SOLN, Place 1 drop into both eyes daily., Disp: , Rfl:    OVER THE COUNTER MEDICATION, Take 1 tablet by mouth daily. Nervive, Disp: , Rfl:    Polyethyl Glycol-Propyl Glycol (SYSTANE OP), Place 1 drop into both eyes daily as needed (Dry eyes)., Disp: , Rfl:    TURMERIC PO, Take 1,000 mg by mouth 2 (two) times daily., Disp: , Rfl:    zinc gluconate 50 MG tablet, Take 50 mg by mouth daily., Disp: , Rfl:   Allergies  Allergen Reactions   Other Other (See  Comments)    Anesthesia causes nausea and vomiting, memory difficulty and problems processing.   Oxycodone Nausea Only   Sulfa Antibiotics Rash   Sulfasalazine Rash   Review of Systems Objective:  There were no vitals filed for this visit.  General: Well developed, nourished, in no acute distress, alert and oriented x3   Dermatological: Skin is warm, dry and supple bilateral. Nails x 10 are well maintained; remaining integument appears unremarkable at this time. There are no open sores, no preulcerative lesions, no rash or signs of infection present.  Vascular: Dorsalis Pedis artery and Posterior Tibial artery pedal pulses are 2/4 bilateral with immedate capillary fill time. Pedal hair growth present. No varicosities and no lower extremity edema present bilateral.   Neruologic: Grossly intact via light touch bilateral. Vibratory intact via tuning fork bilateral. Protective threshold with Semmes Wienstein monofilament intact to all pedal sites bilateral. Patellar and Achilles deep tendon reflexes 2+ bilateral. No Babinski or clonus noted bilateral.   Musculoskeletal: No gross boney pedal deformities bilateral. No pain, crepitus, or limitation noted with foot and ankle range of motion bilateral.  Muscle strength demonstrates good dorsiflexion plantarflexion and eversion.   Inversion against resistance is painful and she has pain on palpation of the posterior tibial tendon at its mid substance just beneath the medial malleolus.  She also has severe pain on palpation medial calcaneal tubercle.  Considerable edema overlying the medial ankle.  Gait: Unassisted, antalgic.    Radiographs:  Radiographs taken today of the ankle and foot demonstrate significant osteoarthritic changes in the foot with generalized demineralized bone.  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  Assessment & Plan:   Assessment: Posterior tibial tendinitis left Planter fasciitis left cannot rule out a tear of the posterior tibial tendon.  Pes planovalgus is noted.  Plan: Discussed etiology pathology conservative surgical therapies I injected the plantar fascia today with 20 mg Kenalog 5 mg and Marcaine.  Morrie Sheldon applied a Tri-Lock type brace around the ankle and we instructed her to wear tennis shoes.  We will start her back on methylprednisolone.  I would like to put her in a cam boot however I am concerned about it bothering her back.  I will follow-up with her in about 1 month if improved we will continue to treat conservatively if not then we will consider MRI.     Shala Baumbach T. [place], [place]

## 2023-10-21 ENCOUNTER — Encounter: Payer: Self-pay | Admitting: Podiatry

## 2023-10-21 ENCOUNTER — Ambulatory Visit (INDEPENDENT_AMBULATORY_CARE_PROVIDER_SITE_OTHER): Payer: Medicare Other | Admitting: Podiatry

## 2023-10-21 DIAGNOSIS — G5791 Unspecified mononeuropathy of right lower limb: Secondary | ICD-10-CM

## 2023-10-21 DIAGNOSIS — S93692A Other sprain of left foot, initial encounter: Secondary | ICD-10-CM | POA: Diagnosis not present

## 2023-10-21 DIAGNOSIS — M66872 Spontaneous rupture of other tendons, left ankle and foot: Secondary | ICD-10-CM | POA: Diagnosis not present

## 2023-10-21 MED ORDER — PREGABALIN 75 MG PO CAPS: 75.0000 mg | ORAL_CAPSULE | Freq: Two times a day (BID) | ORAL | 3 refills | Status: DC

## 2023-10-21 NOTE — Progress Notes (Signed)
She presents today for follow-up of her posterior tibial tendinitis.  States that it has been still been hurting the brace does help to some degree with my walking though but is still painful.  She still has pain on palpation medial calcaneal tubercle of her left foot and ankle.  Objective: Vital signs stable alert oriented x 3.  Pulses are palpable.  Severe pain on palpation of the posterior tibial tendon left foot has flattened almost completely.  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  Plan: Recurrently requesting MRI to evaluate for posterior tibial tendon tear after all conservative therapies including steroids nonsteroidals injectables and bracing has failed.  Also will start her on Lyrica 75 mg 1 p.o. twice daily for her burning in her foot.  She will start at nighttime for the first week and advance to twice daily after that.

## 2023-10-22 DIAGNOSIS — G4733 Obstructive sleep apnea (adult) (pediatric): Secondary | ICD-10-CM | POA: Diagnosis not present

## 2023-10-28 DIAGNOSIS — D509 Iron deficiency anemia, unspecified: Secondary | ICD-10-CM | POA: Diagnosis not present

## 2023-11-09 ENCOUNTER — Ambulatory Visit
Admission: RE | Admit: 2023-11-09 | Discharge: 2023-11-09 | Disposition: A | Discharge status: Home / Self Care | Payer: Medicare Other | Source: Ambulatory Visit | Attending: Podiatry | Admitting: Podiatry

## 2023-11-09 DIAGNOSIS — M66872 Spontaneous rupture of other tendons, left ankle and foot: Secondary | ICD-10-CM

## 2023-11-09 DIAGNOSIS — S93692A Other sprain of left foot, initial encounter: Secondary | ICD-10-CM

## 2023-11-09 DIAGNOSIS — M25572 Pain in left ankle and joints of left foot: Secondary | ICD-10-CM | POA: Diagnosis not present

## 2023-11-09 DIAGNOSIS — M76822 Posterior tibial tendinitis, left leg: Secondary | ICD-10-CM | POA: Diagnosis not present

## 2023-11-09 DIAGNOSIS — M7989 Other specified soft tissue disorders: Secondary | ICD-10-CM | POA: Diagnosis not present

## 2023-11-12 DIAGNOSIS — L720 Epidermal cyst: Secondary | ICD-10-CM | POA: Diagnosis not present

## 2023-11-12 DIAGNOSIS — L738 Other specified follicular disorders: Secondary | ICD-10-CM | POA: Diagnosis not present

## 2023-11-12 DIAGNOSIS — L309 Dermatitis, unspecified: Secondary | ICD-10-CM | POA: Diagnosis not present

## 2023-11-12 DIAGNOSIS — L814 Other melanin hyperpigmentation: Secondary | ICD-10-CM | POA: Diagnosis not present

## 2023-11-12 DIAGNOSIS — L821 Other seborrheic keratosis: Secondary | ICD-10-CM | POA: Diagnosis not present

## 2023-11-25 ENCOUNTER — Ambulatory Visit: Payer: Medicare Other | Admitting: Podiatry

## 2023-12-07 ENCOUNTER — Encounter: Payer: Self-pay | Admitting: Podiatry

## 2023-12-07 ENCOUNTER — Ambulatory Visit (INDEPENDENT_AMBULATORY_CARE_PROVIDER_SITE_OTHER): Payer: Medicare Other | Admitting: Podiatry

## 2023-12-07 DIAGNOSIS — M76822 Posterior tibial tendinitis, left leg: Secondary | ICD-10-CM

## 2023-12-07 MED ORDER — TRIAMCINOLONE ACETONIDE 40 MG/ML IJ SUSP
40.0000 mg | Freq: Once | INTRAMUSCULAR | Status: AC
Start: 2023-12-07 — End: 2023-12-07
  Administered 2023-12-07: 40 mg

## 2023-12-07 NOTE — Progress Notes (Signed)
She presents today for MRI follow-up.  She does have posterior tibial tendinitis and severe flatfoot disease posterior tibial tendon dysfunction.  She states that is really just not getting any better it hurts all the time I am having back surgery in the next few weeks.  Objective: Vital signs stable alert oriented x 3 pulses are palpable.  Severe flatfoot deformity pain on palpation of the posterior tibial tendon MRI does demonstrate posterior tibial tendinitis tendinopathy with tears possible.  She has plantar fasciitis and peroneal tendinopathy as well.  Assessment: Posterior tibial tendon dysfunction left primarily.  Plan: Discussed the need for surgical intervention with her.  Once she has completed her back surgery and rehabilitation then we will consider surgical intervention with her left foot or even bracing this foot.  She understands this and is amenable to it.  She is having Marikay Alar do her surgery.

## 2023-12-22 DIAGNOSIS — G4733 Obstructive sleep apnea (adult) (pediatric): Secondary | ICD-10-CM | POA: Diagnosis not present

## 2023-12-25 ENCOUNTER — Encounter (HOSPITAL_COMMUNITY): Payer: Self-pay

## 2023-12-25 NOTE — Pre-Procedure Instructions (Signed)
 Surgical Instructions   Your procedure is scheduled on January 04, 2024. Report to [facility] Main Entrance A at 5:30 A.M., then check in with the Admitting office. Any questions or running late day of surgery: call [phone]  Questions prior to your surgery date: call [phone], Monday-Friday, 8am-4pm. If you experience any cold or flu symptoms such as cough, fever, chills, shortness of breath, etc. between now and your scheduled surgery, please notify us  at the above number.     Remember:  Do not eat or drink after midnight the night before your surgery    Take these medicines the morning of surgery with A SIP OF WATER: acetaminophen  (TYLENOL )  atorvastatin  (LIPITOR )  fenofibrate  (TRICOR )  fluticasone  (FLONASE ) nasal spray  metoprolol  tartrate (LOPRESSOR )  Olopatadine  HCl (PATADAY ) eye drops   May take these medicines IF NEEDED: Polyethyl Glycol-Propyl Glycol (SYSTANE OP) eye drops   Follow your surgeon's instructions on when to stop Aspirin .  If no instructions were given by your surgeon then you will need to call the office to get those instructions.     One week prior to surgery, STOP taking any Aleve, Naproxen, Ibuprofen, Motrin, Advil, Goody's, BC's, all herbal medications, fish oil, and non-prescription vitamins.                     Do NOT Smoke (Tobacco/Vaping) for 24 hours prior to your procedure.  If you use a CPAP at night, you may bring your mask/headgear for your overnight stay.   You will be asked to remove any contacts, glasses, piercing's, hearing aid's, dentures/partials prior to surgery. Please bring cases for these items if needed.    Patients discharged the day of surgery will not be allowed to drive home, and someone needs to stay with them for 24 hours.  SURGICAL WAITING ROOM VISITATION Patients may have no more than 2 support people in the waiting area - these visitors may rotate.   Pre-op nurse will coordinate an appropriate time for 1  ADULT support person, who may not rotate, to accompany patient in pre-op.  Children under the age of 22 must have an adult with them who is not the patient and must remain in the main waiting area with an adult.  If the patient needs to stay at the hospital during part of their recovery, the visitor guidelines for inpatient rooms apply.  Please refer to the [facility] website for the visitor guidelines for any additional information.   If you received a COVID test during your pre-op visit  it is requested that you wear a mask when out in public, stay away from anyone that may not be feeling well and notify your surgeon if you develop symptoms. If you have been in contact with anyone that has tested positive in the last 10 days please notify you surgeon.      Pre-operative 5 CHG Bathing Instructions   You can play a key role in reducing the risk of infection after surgery. Your skin needs to be as free of germs as possible. You can reduce the number of germs on your skin by washing with CHG (chlorhexidine  gluconate) soap before surgery. CHG is an antiseptic soap that kills germs and continues to kill germs even after washing.   DO NOT use if you have an allergy to chlorhexidine /CHG or antibacterial soaps. If your skin becomes reddened or irritated, stop using the CHG and notify one of our RNs at [phone].   Please shower with the  CHG soap starting 4 days before surgery using the following schedule:     Please keep in mind the following:  DO NOT shave, including legs and underarms, starting the day of your first shower.   You may shave your face at any point before/day of surgery.  Place clean sheets on your bed the day you start using CHG soap. Use a clean washcloth (not used since being washed) for each shower. DO NOT sleep with pets once you start using the CHG.   CHG Shower Instructions:  Wash your face and private area with normal soap. If you choose to wash your hair, wash  first with your normal shampoo.  After you use shampoo/soap, rinse your hair and body thoroughly to remove shampoo/soap residue.  Turn the water OFF and apply about 3 tablespoons (45 ml) of CHG soap to a CLEAN washcloth.  Apply CHG soap ONLY FROM YOUR NECK DOWN TO YOUR TOES (washing for 3-5 minutes)  DO NOT use CHG soap on face, private areas, open wounds, or sores.  Pay special attention to the area where your surgery is being performed.  If you are having back surgery, having someone wash your back for you may be helpful. Wait 2 minutes after CHG soap is applied, then you may rinse off the CHG soap.  Pat dry with a clean towel  Put on clean clothes/pajamas   If you choose to wear lotion, please use ONLY the CHG-compatible lotions on the back of this paper.   Additional instructions for the day of surgery: DO NOT APPLY any lotions, deodorants, cologne, or perfumes.   Do not bring valuables to the hospital. [facility] is not responsible for any belongings/valuables. Do not wear nail polish, gel polish, artificial nails, or any other type of covering on natural nails (fingers and toes) Do not wear jewelry or makeup Put on clean/comfortable clothes.  Please brush your teeth.  Ask your nurse before applying any prescription medications to the skin.     CHG Compatible Lotions   Aveeno Moisturizing lotion  Cetaphil Moisturizing Cream  Cetaphil Moisturizing Lotion  Clairol Herbal Essence Moisturizing Lotion, Dry Skin  Clairol Herbal Essence Moisturizing Lotion, Extra Dry Skin  Clairol Herbal Essence Moisturizing Lotion, Normal Skin  Curel Age Defying Therapeutic Moisturizing Lotion with Alpha Hydroxy  Curel Extreme Care Body Lotion  Curel Soothing Hands Moisturizing Hand Lotion  Curel Therapeutic Moisturizing Cream, Fragrance-Free  Curel Therapeutic Moisturizing Lotion, Fragrance-Free  Curel Therapeutic Moisturizing Lotion, Original Formula  Eucerin Daily Replenishing Lotion   Eucerin Dry Skin Therapy Plus Alpha Hydroxy Crme  Eucerin Dry Skin Therapy Plus Alpha Hydroxy Lotion  Eucerin Original Crme  Eucerin Original Lotion  Eucerin Plus Crme Eucerin Plus Lotion  Eucerin TriLipid Replenishing Lotion  Keri Anti-Bacterial Hand Lotion  Keri Deep Conditioning Original Lotion Dry Skin Formula Softly Scented  Keri Deep Conditioning Original Lotion, Fragrance Free Sensitive Skin Formula  Keri Lotion Fast Absorbing Fragrance Free Sensitive Skin Formula  Keri Lotion Fast Absorbing Softly Scented Dry Skin Formula  Keri Original Lotion  Keri Skin Renewal Lotion Keri Silky Smooth Lotion  Keri Silky Smooth Sensitive Skin Lotion  Nivea Body Creamy Conditioning Oil  Nivea Body Extra Enriched Teacher, Adult Education Moisturizing Lotion Nivea Crme  Nivea Skin Firming Lotion  NutraDerm 30 Skin Lotion  NutraDerm Skin Lotion  NutraDerm Therapeutic Skin Cream  NutraDerm Therapeutic Skin Lotion  ProShield Protective Hand Cream  Provon moisturizing lotion  Please read over  the following fact sheets that you were given.

## 2023-12-28 ENCOUNTER — Encounter (HOSPITAL_COMMUNITY)
Admission: RE | Admit: 2023-12-28 | Discharge: 2023-12-28 | Disposition: A | Discharge status: Home / Self Care | Payer: Medicare Other | Source: Ambulatory Visit | Attending: Internal Medicine | Admitting: Internal Medicine

## 2023-12-28 ENCOUNTER — Encounter (HOSPITAL_COMMUNITY): Payer: Self-pay

## 2023-12-28 ENCOUNTER — Other Ambulatory Visit: Payer: Self-pay

## 2023-12-28 ENCOUNTER — Other Ambulatory Visit: Payer: Self-pay | Admitting: Physician Assistant

## 2023-12-28 ENCOUNTER — Encounter (HOSPITAL_COMMUNITY): Payer: Self-pay | Admitting: Vascular Surgery

## 2023-12-28 VITALS — BP 133/50 | HR 58 | Temp 98.3°F | Resp 18 | Ht 62.0 in | Wt 183.3 lb

## 2023-12-28 DIAGNOSIS — G4733 Obstructive sleep apnea (adult) (pediatric): Secondary | ICD-10-CM | POA: Insufficient documentation

## 2023-12-28 DIAGNOSIS — Z01812 Encounter for preprocedural laboratory examination: Secondary | ICD-10-CM | POA: Insufficient documentation

## 2023-12-28 DIAGNOSIS — J45909 Unspecified asthma, uncomplicated: Secondary | ICD-10-CM | POA: Diagnosis not present

## 2023-12-28 DIAGNOSIS — I10 Essential (primary) hypertension: Secondary | ICD-10-CM

## 2023-12-28 DIAGNOSIS — E785 Hyperlipidemia, unspecified: Secondary | ICD-10-CM | POA: Insufficient documentation

## 2023-12-28 DIAGNOSIS — N183 Chronic kidney disease, stage 3 unspecified: Secondary | ICD-10-CM | POA: Insufficient documentation

## 2023-12-28 DIAGNOSIS — I2510 Atherosclerotic heart disease of native coronary artery without angina pectoris: Secondary | ICD-10-CM | POA: Diagnosis not present

## 2023-12-28 DIAGNOSIS — Z8673 Personal history of transient ischemic attack (TIA), and cerebral infarction without residual deficits: Secondary | ICD-10-CM | POA: Insufficient documentation

## 2023-12-28 DIAGNOSIS — Z951 Presence of aortocoronary bypass graft: Secondary | ICD-10-CM | POA: Insufficient documentation

## 2023-12-28 DIAGNOSIS — I129 Hypertensive chronic kidney disease with stage 1 through stage 4 chronic kidney disease, or unspecified chronic kidney disease: Secondary | ICD-10-CM | POA: Diagnosis not present

## 2023-12-28 DIAGNOSIS — Z01818 Encounter for other preprocedural examination: Secondary | ICD-10-CM

## 2023-12-28 DIAGNOSIS — Z1152 Encounter for screening for COVID-19: Secondary | ICD-10-CM | POA: Insufficient documentation

## 2023-12-28 DIAGNOSIS — M48061 Spinal stenosis, lumbar region without neurogenic claudication: Secondary | ICD-10-CM | POA: Insufficient documentation

## 2023-12-28 HISTORY — DX: Atherosclerotic heart disease of native coronary artery without angina pectoris: I25.10

## 2023-12-28 HISTORY — DX: Cardiac murmur, unspecified: R01.1

## 2023-12-28 HISTORY — DX: Cerebral infarction, unspecified: I63.9

## 2023-12-28 HISTORY — DX: Iron deficiency anemia, unspecified: D50.9

## 2023-12-28 LAB — SURGICAL PCR SCREEN
MRSA, PCR: NEGATIVE
Staphylococcus aureus: NEGATIVE

## 2023-12-28 LAB — BASIC METABOLIC PANEL
Anion gap: 11 (ref 5–15)
BUN: 31 mg/dL — ABNORMAL HIGH (ref 8–23)
CO2: 24 mmol/L (ref 22–32)
Calcium: 9.9 mg/dL (ref 8.9–10.3)
Chloride: 108 mmol/L (ref 98–111)
Creatinine, Ser: 1.36 mg/dL — ABNORMAL HIGH (ref 0.44–1.00)
GFR, Estimated: 39 mL/min — ABNORMAL LOW (ref >60)
Glucose, Bld: 112 mg/dL — ABNORMAL HIGH (ref 70–99)
Potassium: 4.2 mmol/L (ref 3.5–5.1)
Sodium: 143 mmol/L (ref 135–145)

## 2023-12-28 LAB — CBC
HCT: 35.9 % — ABNORMAL LOW (ref 36.0–46.0)
Hemoglobin: 11.5 g/dL — ABNORMAL LOW (ref 12.0–15.0)
MCH: 30.6 pg (ref 26.0–34.0)
MCHC: 32 g/dL (ref 30.0–36.0)
MCV: 95.5 fL (ref 80.0–100.0)
Platelets: 261 10*3/uL (ref 150–400)
RBC: 3.76 MIL/uL — ABNORMAL LOW (ref 3.87–5.11)
RDW: 12.8 % (ref 11.5–15.5)
WBC: 6.5 10*3/uL (ref 4.0–10.5)
nRBC: 0 % (ref 0.0–0.2)

## 2023-12-28 LAB — TYPE AND SCREEN
ABO/RH(D): A POS
Antibody Screen: NEGATIVE

## 2023-12-28 LAB — PROTIME-INR
INR: 1.1 (ref 0.8–1.2)
Prothrombin Time: 14 s (ref 11.4–15.2)

## 2023-12-28 NOTE — Anesthesia Preprocedure Evaluation (Signed)
Anesthesia Evaluation    Airway        Dental   Pulmonary           Cardiovascular hypertension,      Neuro/Psych    GI/Hepatic   Endo/Other    Renal/GU      Musculoskeletal   Abdominal   Peds  Hematology   Anesthesia Other Findings   Reproductive/Obstetrics                             Anesthesia Physical Anesthesia Plan  ASA:   Anesthesia Plan:    Post-op Pain Management:    Induction:   PONV Risk Score and Plan:   Airway Management Planned:   Additional Equipment:   Intra-op Plan:   Post-operative Plan:   Informed Consent:   Plan Discussed with:   Anesthesia Plan Comments: (PAT note written by Tambria Pfannenstiel, PA-C.  )       Anesthesia Quick Evaluation  

## 2023-12-28 NOTE — Progress Notes (Addendum)
 Anesthesia PAT Evaluation:  Case: [id number] Date/Time: 01/04/24 0715   Procedures:      Laminectomy and Foraminotomy - L1-L2 - L2-L3, posterolateral fusion L2-3 with non-segmental fixation (Back)     Removal hardware L3-5 (Back)   Anesthesia type: General   Pre-op diagnosis: Stenosis   [facility] ROOM 18 / [facility]   Surgeons: Joshua Alm Hamilton, MD       DISCUSSION: Patient is an 83 year old female scheduled for the above procedure.  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 BMI is consistent with obesity. 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   Preoperative cardiology input outlined by Jerilynn Collar, DNP on 07/16/23, Given past medical history and time since last visit, based on ACC/AHA guidelines, Inocente DELENA Jewett, Dr. is at acceptable risk for the planned procedure without further cardiovascular testing.    Per Dr. Darliss:  Preprocedural exam, back surgery being planned. Okay for back surgery from a cardiac perspective. Last echo with normal EF, patient denies chest pain.    Per office protocol, if patient is without any new symptoms or concerns at the time of their virtual visit, she may hold ASA for 7 days days prior to procedure. Please resume ASA as soon as possible postprocedure, at the discretion of the surgeon. We discussed cardiology recommendations regarding ASA and confirming instructions with those received from surgeon.   I spoke with her at her 12/28/23 PAT visit because she reported some sinus congestion for about 4 days. She has known allergic rhinitis and uses Flonase  nasal spray daily.  She had a slight runny nose initially and thought it may be related to allergies or a change  in the weather. She did not have fever, chills, sore throat, or body aches. She does has an occasional dry cough. No wheezing. On 12/28/23, she noticed more sinus pressure and nasal congestion following the wintry weather over the previous 24 hours. She says she is up to date with recommended vaccines. She appears well on exam without coughing, sneezing, or runny nose. Lungs clear without wheezes, rhonchi, or crackles. Heart RRR, II/VI murmur noted. She has 1+ ankle edema, LLE > RLE which she says is chronic.  No conversational dyspnea. She does report limitations in walking long distances for around 8 years. She is able to do her own shopping as long as she uses the shopping cart for support.   As of her PAT visit, she was not experiencing any significant respiratory symptoms. Primarily reported some mild nasal congestion and sinus pressure, but currently appears well. At this point, difficult to know if symptoms are related to her known allergic rhinitis, or if she could be experiencing early URI symptoms. She will continue Flonase  daily.  Surgery is still a week out. We did discuss that she should notify Dr. Joshua' office if she does develop sick or worsening symptoms, need for antibiotics or steroids, etc, as surgery would need to be postponed for 2-4 weeks depending on duration. I offered her a COVID-19 test if she desired to rule out. She did request COVID testing which was negative. Current symptoms did not suggest influenza. If her mild symptoms resolve or do not progress then I would anticipate that she could proceed.    VS: BP (!) 133/50   Pulse ROLLEN)  58   Temp 36.8 C (Oral)   Resp 18   Ht 5' 2 (1.575 m)   Wt 83.1 kg   SpO2 98%   BMI 33.53 kg/m    PROVIDERS: Dwight Trula SQUIBB, MD is PCP [facility]) Darliss Rogue, MD is cardiologist Kriss Stagger, DO is GI Rosemarie Rothman, MD is neurologist. Last visit 07/07/22 with Whitfield Raisin, NP with as needed follow-up recommended.     LABS: Preoperative labs noted. Cr 1.36 which appears consistent with results since post CABG in 08/2022.  (all labs ordered are listed, but only abnormal results are displayed)  Labs Reviewed  BASIC METABOLIC PANEL - Abnormal; Notable for the following components:      Result Value   Glucose, Bld 112 (*)    BUN 31 (*)    Creatinine, Ser 1.36 (*)    GFR, Estimated 39 (*)    All other components within normal limits  CBC - Abnormal; Notable for the following components:   RBC 3.76 (*)    Hemoglobin 11.5 (*)    HCT 35.9 (*)    All other components within normal limits  SURGICAL PCR SCREEN  SARS CORONAVIRUS 2 (TAT 6-24 HRS)  PROTIME-INR  TYPE AND SCREEN     IMAGES: CT L-spine 07/03/23: IMPRESSION: 1. Prior L3-L5 PLIF without hardware complication. 2. Progressive spondylosis at L1-L2 with worsening mild-to-moderate spinal canal stenosis. Unchanged mild-to-moderate left neuroforaminal stenosis. 3. Unchanged adjacent segment disease at L2-L3 with moderate spinal canal stenosis. 4. Unchanged adjacent segment disease at L5-S1 with moderate bilateral neuroforaminal stenosis. 5.  Aortic Atherosclerosis (ICD10-I70.0).   EKG: 06/05/23: SB at 58 bpm   CV: 14 day Monitor 10/31/22 - 11/14/22: Patient had a min HR of 42 bpm, max HR of 125 bpm, and avg HR of 57 bpm. Predominant underlying rhythm was Sinus Rhythm. 1 run of Supraventricular Tachycardia occurred lasting 6 beats with a max rate of 125 bpm (avg 116 bpm). Isolated SVEs were rare  (<1.0%), SVE Couplets were rare (<1.0%), and no SVE Triplets were present. Isolated VEs were rare (<1.0%), VE Couplets were rare (<1.0%), and no VE Triplets were present. Ventricular Bigeminy was present.   - Conclusion No evidence for atrial fibrillation on cardiac monitor. Continue medications as prescribed.   Echo 09/09/22: IMPRESSIONS   1. Left ventricular ejection fraction, by estimation, is 55 to 60%. The  left ventricle has normal  function. The left ventricle has no regional  wall motion abnormalities. Left ventricular diastolic parameters are  consistent with Grade I diastolic  dysfunction (impaired relaxation). The average left ventricular global  longitudinal strain is -16.1 %.   2. Right ventricular systolic function is normal. The right ventricular  size is normal. There is normal pulmonary artery systolic pressure. The  estimated right ventricular systolic pressure is 21.3 mmHg.   3. The mitral valve is normal in structure. Mild mitral valve  regurgitation. No evidence of mitral stenosis.   4. The aortic valve was not well visualized. Aortic valve regurgitation  is not visualized. Aortic valve sclerosis/calcification is present,  without any evidence of aortic stenosis.   5. The inferior vena cava is normal in size with greater than 50%  respiratory variability, suggesting right atrial pressure of 3 mmHg.  - Comparison(s): 08/20/22-EF 60-65%.    US  Carotid 08/22/22: Summary:  - Right Carotid: Velocities in the right ICA are consistent with a 1-39% stenosis.  - Left Carotid: Velocities in the left ICA are consistent with a 1-39% stenosis.  - Vertebrals:  Bilateral vertebral arteries demonstrate antegrade flow.  - Subclavians: Normal flow hemodynamics were seen in bilateral subclavian arteries    Last LHC was 08/20/22 prior to 3V CABG.   Past Medical History:  Diagnosis Date   Acne rosacea    Allergic rhinitis    Chronic kidney disease    kidney stones, Nov. 2013- tx with Lithotripsy, still has some present    Coronary artery disease    CABG 2023   Cough variant asthma    Degenerative disc disease    knees, ankles, back- OA   GERD (gastroesophageal reflux disease)    Headache(784.0)    since stopping aleve- 07/30/2013   Hyperlipemia    dyslipidemia   Hypertension    [facility] cardiac pharmacist follows pt. [facility]WO[facility] ,has never taken anti-hypertensive   Osteoarthritis    Plantar fasciitis     (using orthotics-they are wearing out)- Dr Annia   Pneumonia    double pneumonia - relative to sinus infection , had chemical cauterization    PONV (postoperative nausea and vomiting)    urgency, N&V for days, memory & processing   Sleep apnea    Bringing cpap mask and tubing, study last done- 2006    Past Surgical History:  Procedure Laterality Date   ABDOMINAL HYSTERECTOMY  1990's   BIOPSY  05/19/2023   Procedure: BIOPSY;  Surgeon: Kriss Estefana DEL, DO;  Location: [facility];  Service: Gastroenterology;;   BLADDER REPAIR     BLADDER SUSPENSION  90's   COLONOSCOPY WITH PROPOFOL  N/A 05/19/2023   Procedure: COLONOSCOPY WITH PROPOFOL ;  Surgeon: Kriss Estefana DEL, DO;  Location: [facility];  Service: Gastroenterology;  Laterality: N/A;   CORONARY ARTERY BYPASS GRAFT N/A 08/26/2022   Procedure: CORONARY ARTERY BYPASS GRAFTING (CABG) X THREE, USING LEFT INTERNAL MAMMARY ARTERY AND RIGHT LEG GREATER SAPHENOUS VEIN HARVESTED ENDOSCOPICALLY;  Surgeon: Obadiah Coy, MD;  Location: [facility] OR;  Service: Open Heart Surgery;  Laterality: N/A;   ESOPHAGOGASTRODUODENOSCOPY (EGD) WITH PROPOFOL  N/A 05/19/2023   Procedure: ESOPHAGOGASTRODUODENOSCOPY (EGD) WITH PROPOFOL ;  Surgeon: Kriss Estefana DEL, DO;  Location: [facility];  Service: Gastroenterology;  Laterality: N/A;   LEFT HEART CATH AND CORONARY ANGIOGRAPHY N/A 08/20/2022   Procedure: LEFT HEART CATH AND CORONARY ANGIOGRAPHY;  Surgeon: Darron Deatrice LABOR, MD;  [facility];  Service: Cardiovascular;  Laterality: N/A;   MAXIMUM ACCESS (MAS)POSTERIOR LUMBAR INTERBODY FUSION (PLIF) 2 LEVEL N/A 08/12/2013   Procedure: FOR MAXIMUM ACCESS (MAS) POSTERIOR LUMBAR INTERBODY FUSION (PLIF) 2 LEVEL;  Surgeon: Alm GORMAN Molt, MD;  Location: [facility];  Service: Neurosurgery;  Laterality: N/A;  FOR MAXIMUM ACCESS (MAS) POSTERIOR LUMBAR INTERBODY FUSION (PLIF) 2 LEVEL   MULTIPLE TOOTH EXTRACTIONS     wisdom teeth extractions    POLYPECTOMY   05/19/2023   Procedure: POLYPECTOMY;  Surgeon: Kriss Estefana DEL, DO;  Location: [facility];  Service: Gastroenterology;;   POSTERIOR FUSION LUMBAR SPINE  08/12/2013   Dr Molt   TEE WITHOUT CARDIOVERSION N/A 08/26/2022   Procedure: TRANSESOPHAGEAL ECHOCARDIOGRAM (TEE);  Surgeon: Obadiah Coy, MD;  Location: [facility] OR;  Service: Open Heart Surgery;  Laterality: N/A;   TONSILLECTOMY      MEDICATIONS: No current facility-administered medications for this encounter.    acetaminophen  (TYLENOL ) 500 MG tablet   alendronate (FOSAMAX) 70 MG tablet   APPLE CIDER VINEGAR PO   aspirin  EC 81 MG tablet   atorvastatin  (LIPITOR ) 40 MG tablet   atorvastatin  (LIPITOR ) 80 MG tablet   Cholecalciferol  (VITAMIN D-3)  125 MCG (5000 UT) TABS   clotrimazole-betamethasone  (LOTRISONE) cream   Cobalamin Combinations (NEURIVA PLUS PO)   fenofibrate  (TRICOR ) 145 MG tablet   ferrous sulfate  324 MG TBEC   fluticasone  (FLONASE ) 50 MCG/ACT nasal spray   LIDOCAINE -MENTHOL , SPRAY, EX   Magnesium  400 MG TABS   metoprolol  tartrate (LOPRESSOR ) 25 MG tablet   Multiple Vitamin (MULTIVITAMIN WITH MINERALS) TABS   Multiple Vitamins-Minerals (EMERGEN-C IMMUNE PLUS/VIT D PO)   MYRBETRIQ  50 MG TB24 tablet   Olopatadine  HCl (PATADAY ) 0.7 % SOLN   OVER THE COUNTER MEDICATION   Polyethyl Glycol-Propyl Glycol (SYSTANE OP)   pregabalin  (LYRICA ) 75 MG capsule   TURMERIC PO   zinc  gluconate 50 MG tablet    Isaiah Ruder, PA-C Surgical Short Stay/Anesthesiology [facility] Phone [phone] [facility] Phone [phone] 12/29/2023 7:26 AM

## 2023-12-28 NOTE — Progress Notes (Signed)
 PCP - Dr. Trula Brim Cardiologist - Dr. Redell Cave  PPM/ICD - denies   Chest x-ray - 09/29/22 EKG - 06/05/23 Stress Test - 01/08/18 ECHO - 09/09/22 Cardiac Cath - 08/20/22  Sleep Study - denies   DM- denies  Last dose of GLP1 agonist-  n/a   Blood Thinner Instructions: n/a Aspirin  Instructions: f/u with surgeon  ERAS Protcol - no, NPO   COVID TEST- n/a   Anesthesia review: yes, cardiac hx. Pt was evaluated in PAT r/t symptoms of  a cold (congestion, sinus drainage w/ clear drainage, and slight cough)  Patient denies shortness of breath, fever, and chest pain at PAT appointment   All instructions explained to the patient, with a verbal understanding of the material. Patient agrees to go over the instructions while at home for a better understanding. The opportunity to ask questions was provided.

## 2023-12-29 LAB — SARS CORONAVIRUS 2 (TAT 6-24 HRS): SARS Coronavirus 2: NEGATIVE

## 2023-12-29 NOTE — Telephone Encounter (Signed)
 Last office visit: 06/05/23 with plan to f/u in 12 months   Next visit: none/active recall

## 2024-01-04 ENCOUNTER — Ambulatory Visit (HOSPITAL_COMMUNITY): Admission: RE | Admit: 2024-01-04 | Payer: Medicare Other | Source: Home / Self Care | Admitting: Neurological Surgery

## 2024-01-04 ENCOUNTER — Encounter (HOSPITAL_COMMUNITY): Admission: RE | Payer: Self-pay | Source: Home / Self Care

## 2024-01-04 SURGERY — LUMBAR LAMINECTOMY/DECOMPRESSION MICRODISCECTOMY 2 LEVELS
Anesthesia: General | Site: Back

## 2024-01-05 DIAGNOSIS — M545 Low back pain, unspecified: Secondary | ICD-10-CM | POA: Diagnosis not present

## 2024-01-05 DIAGNOSIS — M48062 Spinal stenosis, lumbar region with neurogenic claudication: Secondary | ICD-10-CM | POA: Diagnosis not present

## 2024-01-07 DIAGNOSIS — M545 Low back pain, unspecified: Secondary | ICD-10-CM | POA: Diagnosis not present

## 2024-01-07 DIAGNOSIS — M48062 Spinal stenosis, lumbar region with neurogenic claudication: Secondary | ICD-10-CM | POA: Diagnosis not present

## 2024-01-13 DIAGNOSIS — M545 Low back pain, unspecified: Secondary | ICD-10-CM | POA: Diagnosis not present

## 2024-01-13 DIAGNOSIS — M48062 Spinal stenosis, lumbar region with neurogenic claudication: Secondary | ICD-10-CM | POA: Diagnosis not present

## 2024-01-14 DIAGNOSIS — M545 Low back pain, unspecified: Secondary | ICD-10-CM | POA: Diagnosis not present

## 2024-01-14 DIAGNOSIS — M48062 Spinal stenosis, lumbar region with neurogenic claudication: Secondary | ICD-10-CM | POA: Diagnosis not present

## 2024-01-20 DIAGNOSIS — M545 Low back pain, unspecified: Secondary | ICD-10-CM | POA: Diagnosis not present

## 2024-01-20 DIAGNOSIS — M48062 Spinal stenosis, lumbar region with neurogenic claudication: Secondary | ICD-10-CM | POA: Diagnosis not present

## 2024-01-22 DIAGNOSIS — M48062 Spinal stenosis, lumbar region with neurogenic claudication: Secondary | ICD-10-CM | POA: Diagnosis not present

## 2024-01-22 DIAGNOSIS — M545 Low back pain, unspecified: Secondary | ICD-10-CM | POA: Diagnosis not present

## 2024-01-26 DIAGNOSIS — Z1231 Encounter for screening mammogram for malignant neoplasm of breast: Secondary | ICD-10-CM | POA: Diagnosis not present

## 2024-01-27 DIAGNOSIS — M545 Low back pain, unspecified: Secondary | ICD-10-CM | POA: Diagnosis not present

## 2024-01-27 DIAGNOSIS — M48062 Spinal stenosis, lumbar region with neurogenic claudication: Secondary | ICD-10-CM | POA: Diagnosis not present

## 2024-02-04 DIAGNOSIS — M48062 Spinal stenosis, lumbar region with neurogenic claudication: Secondary | ICD-10-CM | POA: Diagnosis not present

## 2024-02-25 ENCOUNTER — Other Ambulatory Visit: Payer: Self-pay | Admitting: Neurological Surgery

## 2024-02-25 DIAGNOSIS — G8929 Other chronic pain: Secondary | ICD-10-CM | POA: Diagnosis not present

## 2024-02-25 DIAGNOSIS — G4733 Obstructive sleep apnea (adult) (pediatric): Secondary | ICD-10-CM | POA: Diagnosis not present

## 2024-02-25 DIAGNOSIS — Z79899 Other long term (current) drug therapy: Secondary | ICD-10-CM | POA: Diagnosis not present

## 2024-02-25 DIAGNOSIS — N1832 Chronic kidney disease, stage 3b: Secondary | ICD-10-CM | POA: Diagnosis not present

## 2024-02-25 DIAGNOSIS — I2510 Atherosclerotic heart disease of native coronary artery without angina pectoris: Secondary | ICD-10-CM | POA: Diagnosis not present

## 2024-02-25 DIAGNOSIS — I129 Hypertensive chronic kidney disease with stage 1 through stage 4 chronic kidney disease, or unspecified chronic kidney disease: Secondary | ICD-10-CM | POA: Diagnosis not present

## 2024-02-25 DIAGNOSIS — M545 Low back pain, unspecified: Secondary | ICD-10-CM | POA: Diagnosis not present

## 2024-02-25 DIAGNOSIS — Z0000 Encounter for general adult medical examination without abnormal findings: Secondary | ICD-10-CM | POA: Diagnosis not present

## 2024-02-25 DIAGNOSIS — E785 Hyperlipidemia, unspecified: Secondary | ICD-10-CM | POA: Diagnosis not present

## 2024-02-25 DIAGNOSIS — D509 Iron deficiency anemia, unspecified: Secondary | ICD-10-CM | POA: Diagnosis not present

## 2024-02-25 DIAGNOSIS — M8589 Other specified disorders of bone density and structure, multiple sites: Secondary | ICD-10-CM | POA: Diagnosis not present

## 2024-02-25 NOTE — Pre-Procedure Instructions (Addendum)
 Surgical Instructions   Your procedure is scheduled on Friday, March 14th. Report to [facility] Main Entrance "A" at 05:30 A.M., then check in with the Admitting office. Any questions or running late day of surgery: call [phone]  Questions prior to your surgery date: call [phone], Monday-Friday, 8am-4pm. If you experience any cold or flu symptoms such as cough, fever, chills, shortness of breath, etc. between now and your scheduled surgery, please notify us at the above number.     Remember:  Do not eat or drink after midnight the night before your surgery    Take these medicines the morning of surgery with A SIP OF WATER  atorvastatin (LIPITOR)  fenofibrate (TRICOR)  fluticasone (FLONASE)  metoprolol tartrate (LOPRESSOR)  Olopatadine HCl (PATADAY) eye drops  May take these medicines IF NEEDED: acetaminophen (TYLENOL)  omeprazole (PRILOSEC OTC)  Polyethyl Glycol-Propyl Glycol (SYSTANE OP) eye drops  Stop taking Aspirin 7 days prior to surgery. Last dose 3/6.  One week prior to surgery, STOP taking any Aspirin (unless otherwise instructed by your surgeon) Aleve, Naproxen, Ibuprofen, Motrin, Advil, Goody's, BC's, all herbal medications, fish oil, and non-prescription vitamins.                     Do NOT Smoke (Tobacco/Vaping) for 24 hours prior to your procedure.  If you use a CPAP at night, you may bring your mask/headgear for your overnight stay.   You will be asked to remove any contacts, glasses, piercing's, hearing aid's, dentures/partials prior to surgery. Please bring cases for these items if needed.    Patients discharged the day of surgery will not be allowed to drive home, and someone needs to stay with them for 24 hours.  SURGICAL WAITING ROOM VISITATION Patients may have no more than 2 support people in the waiting area - these visitors may rotate.   Pre-op nurse will coordinate an appropriate time for 1 ADULT support person, who may not rotate, to  accompany patient in pre-op.  Children under the age of 37 must have an adult with them who is not the patient and must remain in the main waiting area with an adult.  If the patient needs to stay at the hospital during part of their recovery, the visitor guidelines for inpatient rooms apply.  Please refer to the [facility] website for the visitor guidelines for any additional information.   If you received a COVID test during your pre-op visit  it is requested that you wear a mask when out in public, stay away from anyone that may not be feeling well and notify your surgeon if you develop symptoms. If you have been in contact with anyone that has tested positive in the last 10 days please notify you surgeon.      Pre-operative 5 CHG Bathing Instructions   You can play a key role in reducing the risk of infection after surgery. Your skin needs to be as free of germs as possible. You can reduce the number of germs on your skin by washing with CHG (chlorhexidine gluconate) soap before surgery. CHG is an antiseptic soap that kills germs and continues to kill germs even after washing.   DO NOT use if you have an allergy to chlorhexidine/CHG or antibacterial soaps. If your skin becomes reddened or irritated, stop using the CHG and notify one of our RNs at [phone].   Please shower with the CHG soap starting 4 days before surgery using the following schedule:  Please keep in mind the following:  DO NOT shave, including legs and underarms, starting the day of your first shower.   You may shave your face at any point before/day of surgery.  Place clean sheets on your bed the day you start using CHG soap. Use a clean washcloth (not used since being washed) for each shower. DO NOT sleep with pets once you start using the CHG.   CHG Shower Instructions:  Wash your face and private area with normal soap. If you choose to wash your hair, wash first with your normal shampoo.  After you use  shampoo/soap, rinse your hair and body thoroughly to remove shampoo/soap residue.  Turn the water OFF and apply about 3 tablespoons (45 ml) of CHG soap to a CLEAN washcloth.  Apply CHG soap ONLY FROM YOUR NECK DOWN TO YOUR TOES (washing for 3-5 minutes)  DO NOT use CHG soap on face, private areas, open wounds, or sores.  Pay special attention to the area where your surgery is being performed.  If you are having back surgery, having someone wash your back for you may be helpful. Wait 2 minutes after CHG soap is applied, then you may rinse off the CHG soap.  Pat dry with a clean towel  Put on clean clothes/pajamas   If you choose to wear lotion, please use ONLY the CHG-compatible lotions that are listed below.  Additional instructions for the day of surgery: DO NOT APPLY any lotions, deodorants, cologne, or perfumes.   Do not bring valuables to the hospital. [facility] is not responsible for any belongings/valuables. Do not wear nail polish, gel polish, artificial nails, or any other type of covering on natural nails (fingers and toes) Do not wear jewelry or makeup Put on clean/comfortable clothes.  Please brush your teeth.  Ask your nurse before applying any prescription medications to the skin.     CHG Compatible Lotions   Aveeno Moisturizing lotion  Cetaphil Moisturizing Cream  Cetaphil Moisturizing Lotion  Clairol Herbal Essence Moisturizing Lotion, Dry Skin  Clairol Herbal Essence Moisturizing Lotion, Extra Dry Skin  Clairol Herbal Essence Moisturizing Lotion, Normal Skin  Curel Age Defying Therapeutic Moisturizing Lotion with Alpha Hydroxy  Curel Extreme Care Body Lotion  Curel Soothing Hands Moisturizing Hand Lotion  Curel Therapeutic Moisturizing Cream, Fragrance-Free  Curel Therapeutic Moisturizing Lotion, Fragrance-Free  Curel Therapeutic Moisturizing Lotion, Original Formula  Eucerin Daily Replenishing Lotion  Eucerin Dry Skin Therapy Plus Alpha Hydroxy Crme  Eucerin  Dry Skin Therapy Plus Alpha Hydroxy Lotion  Eucerin Original Crme  Eucerin Original Lotion  Eucerin Plus Crme Eucerin Plus Lotion  Eucerin TriLipid Replenishing Lotion  Keri Anti-Bacterial Hand Lotion  Keri Deep Conditioning Original Lotion Dry Skin Formula Softly Scented  Keri Deep Conditioning Original Lotion, Fragrance Free Sensitive Skin Formula  Keri Lotion Fast Absorbing Fragrance Free Sensitive Skin Formula  Keri Lotion Fast Absorbing Softly Scented Dry Skin Formula  Keri Original Lotion  Keri Skin Renewal Lotion Keri Silky Smooth Lotion  Keri Silky Smooth Sensitive Skin Lotion  Nivea Body Creamy Conditioning Oil  Nivea Body Extra Enriched Lotion  Nivea Body Original Lotion  Nivea Body Sheer Moisturizing Lotion Nivea Crme  Nivea Skin Firming Lotion  NutraDerm 30 Skin Lotion  NutraDerm Skin Lotion  NutraDerm Therapeutic Skin Cream  NutraDerm Therapeutic Skin Lotion  ProShield Protective Hand Cream  Provon moisturizing lotion  Please read over the following fact sheets that you were given.

## 2024-02-26 ENCOUNTER — Other Ambulatory Visit: Payer: Self-pay

## 2024-02-26 ENCOUNTER — Encounter (HOSPITAL_COMMUNITY): Payer: Self-pay

## 2024-02-26 ENCOUNTER — Other Ambulatory Visit: Payer: Self-pay | Admitting: Internal Medicine

## 2024-02-26 ENCOUNTER — Encounter (HOSPITAL_COMMUNITY)
Admission: RE | Admit: 2024-02-26 | Discharge: 2024-02-26 | Disposition: A | Discharge status: Home / Self Care | Source: Ambulatory Visit | Attending: Neurological Surgery | Admitting: Neurological Surgery

## 2024-02-26 VITALS — BP 142/57 | HR 72 | Temp 98.4°F | Resp 17 | Ht 63.0 in | Wt 180.9 lb

## 2024-02-26 DIAGNOSIS — N189 Chronic kidney disease, unspecified: Secondary | ICD-10-CM | POA: Diagnosis not present

## 2024-02-26 DIAGNOSIS — M48061 Spinal stenosis, lumbar region without neurogenic claudication: Secondary | ICD-10-CM | POA: Diagnosis not present

## 2024-02-26 DIAGNOSIS — I129 Hypertensive chronic kidney disease with stage 1 through stage 4 chronic kidney disease, or unspecified chronic kidney disease: Secondary | ICD-10-CM | POA: Diagnosis not present

## 2024-02-26 DIAGNOSIS — E785 Hyperlipidemia, unspecified: Secondary | ICD-10-CM | POA: Insufficient documentation

## 2024-02-26 DIAGNOSIS — Z8673 Personal history of transient ischemic attack (TIA), and cerebral infarction without residual deficits: Secondary | ICD-10-CM | POA: Diagnosis not present

## 2024-02-26 DIAGNOSIS — M8589 Other specified disorders of bone density and structure, multiple sites: Secondary | ICD-10-CM

## 2024-02-26 DIAGNOSIS — K219 Gastro-esophageal reflux disease without esophagitis: Secondary | ICD-10-CM | POA: Insufficient documentation

## 2024-02-26 DIAGNOSIS — Z01812 Encounter for preprocedural laboratory examination: Secondary | ICD-10-CM | POA: Diagnosis not present

## 2024-02-26 DIAGNOSIS — G4733 Obstructive sleep apnea (adult) (pediatric): Secondary | ICD-10-CM | POA: Diagnosis not present

## 2024-02-26 DIAGNOSIS — I2510 Atherosclerotic heart disease of native coronary artery without angina pectoris: Secondary | ICD-10-CM | POA: Insufficient documentation

## 2024-02-26 DIAGNOSIS — Z951 Presence of aortocoronary bypass graft: Secondary | ICD-10-CM | POA: Diagnosis not present

## 2024-02-26 DIAGNOSIS — Z01818 Encounter for other preprocedural examination: Secondary | ICD-10-CM

## 2024-02-26 LAB — BASIC METABOLIC PANEL
Anion gap: 12 (ref 5–15)
BUN: 34 mg/dL — ABNORMAL HIGH (ref 8–23)
CO2: 25 mmol/L (ref 22–32)
Calcium: 9.6 mg/dL (ref 8.9–10.3)
Chloride: 106 mmol/L (ref 98–111)
Creatinine, Ser: 1.64 mg/dL — ABNORMAL HIGH (ref 0.44–1.00)
GFR, Estimated: 31 mL/min — ABNORMAL LOW (ref >60)
Glucose, Bld: 113 mg/dL — ABNORMAL HIGH (ref 70–99)
Potassium: 4.2 mmol/L (ref 3.5–5.1)
Sodium: 143 mmol/L (ref 135–145)

## 2024-02-26 LAB — CBC
HCT: 36.4 % (ref 36.0–46.0)
Hemoglobin: 11.8 g/dL — ABNORMAL LOW (ref 12.0–15.0)
MCH: 30.4 pg (ref 26.0–34.0)
MCHC: 32.4 g/dL (ref 30.0–36.0)
MCV: 93.8 fL (ref 80.0–100.0)
Platelets: 239 10*3/uL (ref 150–400)
RBC: 3.88 MIL/uL (ref 3.87–5.11)
RDW: 12.8 % (ref 11.5–15.5)
WBC: 6.4 10*3/uL (ref 4.0–10.5)
nRBC: 0 % (ref 0.0–0.2)

## 2024-02-26 LAB — SURGICAL PCR SCREEN
MRSA, PCR: NEGATIVE
Staphylococcus aureus: NEGATIVE

## 2024-02-26 LAB — TYPE AND SCREEN
ABO/RH(D): A POS
Antibody Screen: NEGATIVE

## 2024-02-26 LAB — PROTIME-INR
INR: 1 (ref 0.8–1.2)
Prothrombin Time: 13.4 s (ref 11.4–15.2)

## 2024-02-26 NOTE — Progress Notes (Signed)
 PCP - Dr. Hillard Danker Cardiologist - Dr. Debbe Odea  PPM/ICD - denies   Chest x-ray - 09/29/22 EKG - 06/05/23 Stress Test - 01/08/18 ECHO - 09/09/22 Cardiac Cath - 08/20/22  Sleep Study - OSA+ CPAP - nightly, pressure settings 11-15  DM- denies  Last dose of GLP1 agonist-  n/a   Blood Thinner Instructions: n/a Aspirin Instructions: hold 7 days, last dose 3/5  ERAS Protcol - no, NPO   COVID TEST- n/a   Anesthesia review: yes, cardiac hx   Patient denies shortness of breath, fever, cough and chest pain at PAT appointment   All instructions explained to the patient, with a verbal understanding of the material. Patient agrees to go over the instructions while at home for a better understanding. The opportunity to ask questions was provided.

## 2024-02-29 NOTE — Anesthesia Preprocedure Evaluation (Signed)
 Anesthesia Evaluation  Patient identified by MRN, date of birth, ID band Patient awake    Reviewed: Allergy & Precautions, NPO status , Patient's Chart, lab work & pertinent test results  History of Anesthesia Complications (+) PONV, DIFFICULT AIRWAY and history of anesthetic complications  Airway Mallampati: II  TM Distance: >3 FB Neck ROM: Full    Dental   Pulmonary neg pulmonary ROS   breath sounds clear to auscultation       Cardiovascular hypertension, Pt. on medications + dysrhythmias + pacemaker  Rhythm:Regular Rate:Normal     Neuro/Psych negative neurological ROS     GI/Hepatic Neg liver ROS,GERD  ,,  Endo/Other  Hypothyroidism    Renal/GU negative Renal ROS     Musculoskeletal  (+) Arthritis ,    Abdominal   Peds  Hematology negative hematology ROS (+)   Anesthesia Other Findings   Reproductive/Obstetrics                             Anesthesia Physical Anesthesia Plan  ASA: 2  Anesthesia Plan: General   Post-op Pain Management: Tylenol PO (pre-op)*   Induction: Intravenous  PONV Risk Score and Plan: 3 and Dexamethasone, Ondansetron, Treatment may vary due to age or medical condition and Scopolamine patch - Pre-op  Airway Management Planned: Oral ETT  Additional Equipment: None  Intra-op Plan:   Post-operative Plan: Extubation in OR  Informed Consent: I have reviewed the patients History and Physical, chart, labs and discussed the procedure including the risks, benefits and alternatives for the proposed anesthesia with the patient or authorized representative who has indicated his/her understanding and acceptance.     Dental advisory given  Plan Discussed with: CRNA  Anesthesia Plan Comments: (  )       Anesthesia Quick Evaluation  Musculoskeletal   Abdominal   Peds  Hematology  (+) Blood dyscrasia, anemia Lab Results      Component                Value               Date                      WBC                      6.4                 02/26/2024                HGB                      11.8 (L)            02/26/2024                HCT                      36.4                02/26/2024                MCV                      93.8                02/26/2024                 PLT                      239                 02/26/2024              Anesthesia Other Findings   Reproductive/Obstetrics                              Anesthesia Physical Anesthesia Plan  ASA: 3  Anesthesia Plan: General   Post-op Pain Management: Tylenol PO (pre-op)* and Gabapentin PO (pre-op)*   Induction: Intravenous  PONV Risk Score and Plan: 4 or greater and Ondansetron, Dexamethasone and Propofol infusion  Airway Management Planned: Oral ETT and Video Laryngoscope Planned  Additional Equipment: None  Intra-op Plan:   Post-operative Plan: Extubation in OR  Informed Consent: I have reviewed the patients History and Physical, chart, labs and discussed the procedure including the risks, benefits and alternatives for the proposed anesthesia with the patient or authorized representative who has indicated his/her understanding and acceptance.       Plan Discussed with: CRNA  Anesthesia Plan Comments: (PAT note written 02/29/2024 by Shonna Chock, PA-C.  )        Anesthesia Quick Evaluation

## 2024-02-29 NOTE — Progress Notes (Signed)
 Anesthesia Chart Review:  Case: [id number] Date/Time: 03/04/24 0715   Procedures:      Laminectomy and Foraminotomy - L1-L2 - L2-L3, posterolateral fusion L2-3 with non-segmental fixation (Back)     Removal hardware L3-5 (Back)   Anesthesia type: General   Pre-op diagnosis: Stenosis   [facility] / [facility]   Surgeons: Arman Bogus, MD       DISCUSSION: Patient is an 83 year old female scheduled for the above procedure. Surgery was initially scheduled for 01/04/24, but postponed due to URI symptoms (I previously evaluated her on 12/28/23).    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 BMI is consistent with obesity. 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    Preoperative cardiology input outlined by Joni Reining, DNP on 07/16/23, "Given past medical history and time since last visit, based on ACC/AHA guidelines, [name], Dr. is at acceptable risk for the planned procedure without further cardiovascular testing.    Per Dr. Azucena Cecil:  Preprocedural exam, back surgery being planned. Okay for back surgery from a cardiac perspective. Last echo with normal EF, patient denies chest pain.    Per office protocol, if patient is without any new symptoms or concerns at the time of their virtual visit, she may hold ASA for 7 days days prior to procedure. Please resume ASA as soon as possible postprocedure, at the discretion of the surgeon." Last ASA dose reported as 02/24/24.   Anesthesia team to evaluate on the day of surgery.     VS: BP (!) 142/57   Pulse 72   Temp 36.9 C   Resp 17   Ht 5\' 3"  (1.6 m)   Wt 82.1 kg   SpO2 97%   BMI 32.04 kg/m   PROVIDERS: Thana Ates, MD is PCP  [facility]) Debbe Odea, MD is cardiologist Liliane Shi, DO is GI Delia Heady, MD is neurologist. Last visit 07/07/22 with Ihor Austin, NP with as needed follow-up recommended.   LABS: Labs reviewed: Acceptable for surgery. Creatinine 1.64 with eGRF 31. She has known CKD stage 3 with range of Cr ~ 1.32-1.56 in [facility] since 08/2022..  (all labs ordered are listed, but only abnormal results are displayed)  Labs Reviewed  BASIC METABOLIC PANEL - Abnormal; Notable for the following components:      Result Value   Glucose, Bld 113 (*)    BUN 34 (*)    Creatinine, Ser 1.64 (*)    GFR, Estimated 31 (*)    All other components within normal limits  CBC - Abnormal; Notable for the following components:   Hemoglobin 11.8 (*)    All other components within normal limits  SURGICAL PCR SCREEN  PROTIME-INR  TYPE AND SCREEN     IMAGES: CT L-spine 07/03/23: IMPRESSION: 1. Prior L3-L5 PLIF without hardware complication. 2. Progressive spondylosis at L1-L2 with worsening mild-to-moderate spinal canal stenosis. Unchanged mild-to-moderate left neuroforaminal stenosis. 3. Unchanged adjacent segment disease at L2-L3 with moderate spinal canal stenosis. 4. Unchanged adjacent segment disease at L5-S1 with moderate bilateral neuroforaminal stenosis. 5.  Aortic Atherosclerosis (ICD10-I70.0).     EKG: 06/05/23: SB at 58 bpm     CV: 14 day Monitor 10/31/22 - 11/14/22: Patient had a min HR of 42 bpm, max HR of 125  bpm, and avg HR of 57 bpm. Predominant underlying rhythm was Sinus Rhythm. 1 run of Supraventricular Tachycardia occurred lasting 6 beats with a max rate of 125 bpm (avg 116 bpm). Isolated SVEs were rare  (<1.0%), SVE Couplets were rare (<1.0%), and no SVE Triplets were present. Isolated VEs were rare (<1.0%), VE Couplets were rare (<1.0%), and no VE Triplets were present. Ventricular Bigeminy was present.   - Conclusion No evidence for atrial fibrillation on cardiac  monitor. Continue medications as prescribed.     Echo 09/09/22: IMPRESSIONS   1. Left ventricular ejection fraction, by estimation, is 55 to 60%. The  left ventricle has normal function. The left ventricle has no regional  wall motion abnormalities. Left ventricular diastolic parameters are  consistent with Grade I diastolic  dysfunction (impaired relaxation). The average left ventricular global  longitudinal strain is -16.1 %.   2. Right ventricular systolic function is normal. The right ventricular  size is normal. There is normal pulmonary artery systolic pressure. The  estimated right ventricular systolic pressure is 21.3 mmHg.   3. The mitral valve is normal in structure. Mild mitral valve  regurgitation. No evidence of mitral stenosis.   4. The aortic valve was not well visualized. Aortic valve regurgitation  is not visualized. Aortic valve sclerosis/calcification is present,  without any evidence of aortic stenosis.   5. The inferior vena cava is normal in size with greater than 50%  respiratory variability, suggesting right atrial pressure of 3 mmHg.  - Comparison(s): 08/20/22-EF 60-65%.      US Carotid 08/22/22: Summary:  - Right Carotid: Velocities in the right ICA are consistent with a 1-39% stenosis.  - Left Carotid: Velocities in the left ICA are consistent with a 1-39% stenosis.  - Vertebrals: Bilateral vertebral arteries demonstrate antegrade flow.  - Subclavians: Normal flow hemodynamics were seen in bilateral subclavian arteries      Last LHC was 08/20/22 prior to 3V CABG.   Past Medical History:  Diagnosis Date   Acne rosacea    Allergic rhinitis    Chronic kidney disease    kidney stones, Nov. 2013- tx with Lithotripsy, still has some present    Coronary artery disease    CABG 2023   Cough variant asthma    Degenerative disc disease    knees, ankles, back- OA   GERD (gastroesophageal reflux disease)    Headache(784.0)    since stopping aleve- 07/30/2013    Hyperlipemia    dyslipidemia   Hypertension    [facility] cardiac pharmacist follows pt. Riki Rusk Smart ,has never taken anti-hypertensive   Iron deficiency anemia    Murmur    mild mitral regurgitation 09/09/22   Osteoarthritis    Plantar fasciitis    (using orthotics-they are wearing out)- Dr Luther Bradley   Pneumonia    double pneumonia - relative to sinus infection , had chemical cauterization    PONV (postoperative nausea and vomiting)    urgency, N&V for days, memory & processing   RSV (respiratory syncytial virus infection) 2023   prior to CABG   Sleep apnea    Bringing cpap mask and tubing, study last done- 2006   Stroke [facility])    mild (only affected right side of mouth per pt)    Past Surgical History:  Procedure Laterality Date   ABDOMINAL HYSTERECTOMY  1990's   BIOPSY  05/19/2023   Procedure: BIOPSY;  Surgeon: Lynann Bologna, DO;  Location: [facility];  Service: Gastroenterology;;   BLADDER REPAIR  BLADDER SUSPENSION  90's   COLONOSCOPY WITH PROPOFOL N/A 05/19/2023   Procedure: COLONOSCOPY WITH PROPOFOL;  Surgeon: Lynann Bologna, DO;  [facility]: [facility];  Service: Gastroenterology;  Laterality: N/A;   CORONARY ARTERY BYPASS GRAFT N/A 08/26/2022   Procedure: CORONARY ARTERY BYPASS GRAFTING (CABG) X THREE, USING LEFT INTERNAL MAMMARY ARTERY AND RIGHT LEG GREATER SAPHENOUS VEIN HARVESTED ENDOSCOPICALLY;  Surgeon: Lovett Sox, MD;  [facility];  Service: Open Heart Surgery;  Laterality: N/A;   ESOPHAGOGASTRODUODENOSCOPY (EGD) WITH PROPOFOL N/A 05/19/2023   Procedure: ESOPHAGOGASTRODUODENOSCOPY (EGD) WITH PROPOFOL;  Surgeon: Lynann Bologna, DO;  [facility];  Service: Gastroenterology;  Laterality: N/A;   LEFT HEART CATH AND CORONARY ANGIOGRAPHY N/A 08/20/2022   Procedure: LEFT HEART CATH AND CORONARY ANGIOGRAPHY;  Surgeon: Iran Ouch, MD;  [facility];  Service: Cardiovascular;  Laterality: N/A;   MAXIMUM ACCESS  (MAS)POSTERIOR LUMBAR INTERBODY FUSION (PLIF) 2 LEVEL N/A 08/12/2013   Procedure: FOR MAXIMUM ACCESS (MAS) POSTERIOR LUMBAR INTERBODY FUSION (PLIF) 2 LEVEL;  Surgeon: Tia Alert, MD;  [facility];  Service: Neurosurgery;  Laterality: N/A;  FOR MAXIMUM ACCESS (MAS) POSTERIOR LUMBAR INTERBODY FUSION (PLIF) 2 LEVEL   MULTIPLE TOOTH EXTRACTIONS     wisdom teeth extractions    POLYPECTOMY  05/19/2023   Procedure: POLYPECTOMY;  Surgeon: Lynann Bologna, DO;  [facility];  Service: Gastroenterology;;   POSTERIOR FUSION LUMBAR SPINE  08/12/2013   Dr Yetta Barre   TEE WITHOUT CARDIOVERSION N/A 08/26/2022   Procedure: TRANSESOPHAGEAL ECHOCARDIOGRAM (TEE);  Surgeon: Lovett Sox, MD;  Location: [facility] OR;  Service: Open Heart Surgery;  Laterality: N/A;   TONSILLECTOMY      MEDICATIONS:  acetaminophen (TYLENOL) 500 MG tablet   alendronate (FOSAMAX) 70 MG tablet   aspirin EC 81 MG tablet   atorvastatin (LIPITOR) 40 MG tablet   Cholecalciferol (VITAMIN D-3) 125 MCG (5000 UT) TABS   clotrimazole-betamethasone (LOTRISONE) cream   Cobalamin Combinations (NEURIVA PLUS PO)   fenofibrate (TRICOR) 145 MG tablet   ferrous sulfate 324 MG TBEC   FIBER PO   fluticasone (FLONASE) 50 MCG/ACT nasal spray   LIDOCAINE-MENTHOL, SPRAY, EX   Magnesium 250 MG CAPS   metoprolol tartrate (LOPRESSOR) 25 MG tablet   Multiple Vitamin (MULTIVITAMIN WITH MINERALS) TABS   Multiple Vitamins-Minerals (EMERGEN-C IMMUNE PLUS/VIT D PO)   MYRBETRIQ 50 MG TB24 tablet   Olopatadine HCl (PATADAY) 0.7 % SOLN   omeprazole (PRILOSEC OTC) 20 MG tablet   Polyethyl Glycol-Propyl Glycol (SYSTANE OP)   TURMERIC PO   zinc gluconate 50 MG tablet   No current facility-administered medications for this encounter.    Shonna Chock, PA-C Surgical Short Stay/Anesthesiology [facility] Phone [phone] [facility] Phone [phone] 02/29/2024 4:30 PM

## 2024-03-04 ENCOUNTER — Ambulatory Visit (HOSPITAL_BASED_OUTPATIENT_CLINIC_OR_DEPARTMENT_OTHER): Admitting: Anesthesiology

## 2024-03-04 ENCOUNTER — Encounter (HOSPITAL_COMMUNITY)
Admission: RE | Disposition: A | Discharge status: Home / Self Care | Payer: Self-pay | Source: Home / Self Care | Attending: Neurological Surgery

## 2024-03-04 ENCOUNTER — Observation Stay (HOSPITAL_COMMUNITY)
Admission: RE | Admit: 2024-03-04 | Discharge: 2024-03-05 | Disposition: A | Discharge status: Home / Self Care | Attending: Neurological Surgery | Admitting: Neurological Surgery

## 2024-03-04 ENCOUNTER — Other Ambulatory Visit: Payer: Self-pay

## 2024-03-04 ENCOUNTER — Encounter (HOSPITAL_COMMUNITY): Payer: Self-pay | Admitting: Neurological Surgery

## 2024-03-04 ENCOUNTER — Ambulatory Visit (HOSPITAL_COMMUNITY)

## 2024-03-04 ENCOUNTER — Ambulatory Visit (HOSPITAL_COMMUNITY): Admitting: Vascular Surgery

## 2024-03-04 DIAGNOSIS — J45909 Unspecified asthma, uncomplicated: Secondary | ICD-10-CM | POA: Diagnosis not present

## 2024-03-04 DIAGNOSIS — Z981 Arthrodesis status: Principal | ICD-10-CM

## 2024-03-04 DIAGNOSIS — Z951 Presence of aortocoronary bypass graft: Secondary | ICD-10-CM | POA: Insufficient documentation

## 2024-03-04 DIAGNOSIS — M48061 Spinal stenosis, lumbar region without neurogenic claudication: Principal | ICD-10-CM | POA: Insufficient documentation

## 2024-03-04 DIAGNOSIS — I10 Essential (primary) hypertension: Secondary | ICD-10-CM | POA: Diagnosis not present

## 2024-03-04 DIAGNOSIS — N189 Chronic kidney disease, unspecified: Secondary | ICD-10-CM

## 2024-03-04 DIAGNOSIS — Z7982 Long term (current) use of aspirin: Secondary | ICD-10-CM | POA: Insufficient documentation

## 2024-03-04 DIAGNOSIS — I129 Hypertensive chronic kidney disease with stage 1 through stage 4 chronic kidney disease, or unspecified chronic kidney disease: Secondary | ICD-10-CM | POA: Insufficient documentation

## 2024-03-04 DIAGNOSIS — Z8673 Personal history of transient ischemic attack (TIA), and cerebral infarction without residual deficits: Secondary | ICD-10-CM | POA: Diagnosis not present

## 2024-03-04 DIAGNOSIS — M4726 Other spondylosis with radiculopathy, lumbar region: Secondary | ICD-10-CM | POA: Insufficient documentation

## 2024-03-04 DIAGNOSIS — I2510 Atherosclerotic heart disease of native coronary artery without angina pectoris: Secondary | ICD-10-CM | POA: Diagnosis not present

## 2024-03-04 DIAGNOSIS — Z79899 Other long term (current) drug therapy: Secondary | ICD-10-CM | POA: Insufficient documentation

## 2024-03-04 DIAGNOSIS — M5416 Radiculopathy, lumbar region: Secondary | ICD-10-CM | POA: Diagnosis not present

## 2024-03-04 HISTORY — PX: HARDWARE REMOVAL: SHX979

## 2024-03-04 HISTORY — PX: LAMINECTOMY WITH POSTERIOR LATERAL ARTHRODESIS LEVEL 2: SHX6336

## 2024-03-04 SURGERY — LAMINECTOMY WITH POSTERIOR LATERAL ARTHRODESIS LEVEL 2
Anesthesia: General | Site: Back

## 2024-03-04 MED ORDER — PHENYLEPHRINE HCL-NACL 20-0.9 MG/250ML-% IV SOLN
INTRAVENOUS | Status: AC
  Filled 2024-03-04: qty 500

## 2024-03-04 MED ORDER — MORPHINE SULFATE (PF) 2 MG/ML IV SOLN
2.0000 mg | INTRAVENOUS | Status: DC | PRN
  Administered 2024-03-04 (×2): 2 mg via INTRAVENOUS
  Filled 2024-03-04 (×2): qty 1

## 2024-03-04 MED ORDER — ACETAMINOPHEN 500 MG PO TABS
1000.0000 mg | ORAL_TABLET | ORAL | Status: AC
  Administered 2024-03-04: 1000 mg via ORAL
  Filled 2024-03-04: qty 2

## 2024-03-04 MED ORDER — CEFAZOLIN SODIUM-DEXTROSE 2-4 GM/100ML-% IV SOLN
2.0000 g | INTRAVENOUS | Status: AC
  Administered 2024-03-04: 2 mg via INTRAVENOUS
  Filled 2024-03-04: qty 100

## 2024-03-04 MED ORDER — SUGAMMADEX SODIUM 200 MG/2ML IV SOLN
INTRAVENOUS | Status: DC | PRN
  Administered 2024-03-04: 200 mg via INTRAVENOUS

## 2024-03-04 MED ORDER — THROMBIN 20000 UNITS EX SOLR
CUTANEOUS | Status: DC | PRN
  Administered 2024-03-04: 20 mL via TOPICAL

## 2024-03-04 MED ORDER — PROPOFOL 10 MG/ML IV BOLUS
INTRAVENOUS | Status: DC | PRN
  Administered 2024-03-04: 50 mg via INTRAVENOUS
  Administered 2024-03-04: 20 ug/kg/min via INTRAVENOUS
  Administered 2024-03-04: 30 ug/kg/min via INTRAVENOUS
  Administered 2024-03-04: 60 mg via INTRAVENOUS

## 2024-03-04 MED ORDER — SODIUM CHLORIDE 0.9% FLUSH: 3.0000 mL | INTRAVENOUS | Status: DC | PRN

## 2024-03-04 MED ORDER — ONDANSETRON HCL 4 MG/2ML IJ SOLN: 4.0000 mg | Freq: Four times a day (QID) | INTRAMUSCULAR | Status: DC | PRN

## 2024-03-04 MED ORDER — ACETAMINOPHEN 160 MG/5ML PO SOLN: 1000.0000 mg | Freq: Once | ORAL | Status: DC | PRN

## 2024-03-04 MED ORDER — METOPROLOL TARTRATE 12.5 MG HALF TABLET
12.5000 mg | ORAL_TABLET | Freq: Two times a day (BID) | ORAL | Status: DC
  Administered 2024-03-04: 12.5 mg via ORAL
  Filled 2024-03-04: qty 1

## 2024-03-04 MED ORDER — BUPIVACAINE HCL (PF) 0.25 % IJ SOLN
INTRAMUSCULAR | Status: AC
  Filled 2024-03-04: qty 30

## 2024-03-04 MED ORDER — VITAMIN D3 25 MCG (1000 UNIT) PO TABS
5000.0000 [IU] | ORAL_TABLET | Freq: Every day | ORAL | Status: DC
  Administered 2024-03-05: 5000 [IU] via ORAL
  Filled 2024-03-04 (×3): qty 5

## 2024-03-04 MED ORDER — THROMBIN 20000 UNITS EX KIT
PACK | CUTANEOUS | Status: AC
  Filled 2024-03-04: qty 1

## 2024-03-04 MED ORDER — HYDROCODONE-ACETAMINOPHEN 5-325 MG PO TABS
1.0000 | ORAL_TABLET | ORAL | Status: DC | PRN
Start: 2024-03-04 — End: 2024-03-05
  Administered 2024-03-04 – 2024-03-05 (×5): 2 via ORAL
  Filled 2024-03-04 (×5): qty 2

## 2024-03-04 MED ORDER — MIRABEGRON ER 50 MG PO TB24
50.0000 mg | ORAL_TABLET | Freq: Every day | ORAL | Status: DC
  Administered 2024-03-04: 50 mg via ORAL
  Filled 2024-03-04 (×2): qty 1

## 2024-03-04 MED ORDER — MIDAZOLAM HCL 2 MG/2ML IJ SOLN
INTRAMUSCULAR | Status: AC
  Filled 2024-03-04: qty 2

## 2024-03-04 MED ORDER — LIDOCAINE HCL (CARDIAC) PF 100 MG/5ML IV SOSY
PREFILLED_SYRINGE | INTRAVENOUS | Status: DC | PRN
  Administered 2024-03-04: 40 mg via INTRATRACHEAL

## 2024-03-04 MED ORDER — FENTANYL CITRATE (PF) 250 MCG/5ML IJ SOLN
INTRAMUSCULAR | Status: DC | PRN
  Administered 2024-03-04: 100 ug via INTRAVENOUS

## 2024-03-04 MED ORDER — ACETAMINOPHEN 500 MG PO TABS: 1000.0000 mg | ORAL_TABLET | Freq: Once | ORAL | Status: DC | PRN

## 2024-03-04 MED ORDER — PROPOFOL 10 MG/ML IV BOLUS
INTRAVENOUS | Status: AC
  Filled 2024-03-04: qty 20

## 2024-03-04 MED ORDER — FENTANYL CITRATE (PF) 250 MCG/5ML IJ SOLN
INTRAMUSCULAR | Status: AC
  Filled 2024-03-04: qty 5

## 2024-03-04 MED ORDER — CEFAZOLIN SODIUM-DEXTROSE 2-4 GM/100ML-% IV SOLN
2.0000 g | Freq: Three times a day (TID) | INTRAVENOUS | Status: AC
  Administered 2024-03-04 (×2): 2 g via INTRAVENOUS
  Filled 2024-03-04 (×2): qty 100

## 2024-03-04 MED ORDER — DEXAMETHASONE SODIUM PHOSPHATE 4 MG/ML IJ SOLN
4.0000 mg | Freq: Four times a day (QID) | INTRAMUSCULAR | Status: DC
  Filled 2024-03-04: qty 1

## 2024-03-04 MED ORDER — 0.9 % SODIUM CHLORIDE (POUR BTL) OPTIME
TOPICAL | Status: DC | PRN
  Administered 2024-03-04: 1000 mL

## 2024-03-04 MED ORDER — FENTANYL CITRATE (PF) 100 MCG/2ML IJ SOLN
25.0000 ug | INTRAMUSCULAR | Status: DC | PRN
  Administered 2024-03-04: 50 ug via INTRAVENOUS

## 2024-03-04 MED ORDER — PHENYLEPHRINE 80 MCG/ML (10ML) SYRINGE FOR IV PUSH (FOR BLOOD PRESSURE SUPPORT)
PREFILLED_SYRINGE | INTRAVENOUS | Status: DC | PRN
  Administered 2024-03-04: 200 ug via INTRAVENOUS

## 2024-03-04 MED ORDER — ACETAMINOPHEN 325 MG PO TABS: 650.0000 mg | ORAL_TABLET | ORAL | Status: DC | PRN

## 2024-03-04 MED ORDER — PHENOL 1.4 % MT LIQD: 1.0000 | OROMUCOSAL | Status: DC | PRN

## 2024-03-04 MED ORDER — LIDOCAINE 2% (20 MG/ML) 5 ML SYRINGE: INTRAMUSCULAR | Status: DC | PRN

## 2024-03-04 MED ORDER — ORAL CARE MOUTH RINSE: 15.0000 mL | Freq: Once | OROMUCOSAL | Status: AC

## 2024-03-04 MED ORDER — ROCURONIUM BROMIDE 100 MG/10ML IV SOLN
INTRAVENOUS | Status: DC | PRN
  Administered 2024-03-04: 80 mg via INTRAVENOUS
  Administered 2024-03-04 (×2): 20 mg via INTRAVENOUS

## 2024-03-04 MED ORDER — SODIUM CHLORIDE 0.9% FLUSH
3.0000 mL | Freq: Two times a day (BID) | INTRAVENOUS | Status: DC
Start: 2024-03-04 — End: 2024-03-05
  Administered 2024-03-04: 10 mL via INTRAVENOUS
  Administered 2024-03-05: 3 mL via INTRAVENOUS

## 2024-03-04 MED ORDER — SODIUM CHLORIDE 0.9 % IV SOLN
250.0000 mL | INTRAVENOUS | Status: DC
  Administered 2024-03-04: 250 mL via INTRAVENOUS

## 2024-03-04 MED ORDER — METHOCARBAMOL 500 MG PO TABS
ORAL_TABLET | ORAL | Status: AC
  Filled 2024-03-04: qty 1

## 2024-03-04 MED ORDER — ONDANSETRON HCL 4 MG PO TABS
4.0000 mg | ORAL_TABLET | Freq: Four times a day (QID) | ORAL | Status: DC | PRN
  Administered 2024-03-05: 4 mg via ORAL
  Filled 2024-03-04: qty 1

## 2024-03-04 MED ORDER — PHENYLEPHRINE HCL-NACL 20-0.9 MG/250ML-% IV SOLN
INTRAVENOUS | Status: DC | PRN
  Administered 2024-03-04: 20 ug/min via INTRAVENOUS

## 2024-03-04 MED ORDER — CHLORHEXIDINE GLUCONATE 0.12 % MT SOLN
15.0000 mL | Freq: Once | OROMUCOSAL | Status: AC
  Administered 2024-03-04: 15 mL via OROMUCOSAL
  Filled 2024-03-04: qty 15

## 2024-03-04 MED ORDER — SENNA 8.6 MG PO TABS
1.0000 | ORAL_TABLET | Freq: Two times a day (BID) | ORAL | Status: DC
  Administered 2024-03-04 – 2024-03-05 (×3): 8.6 mg via ORAL
  Filled 2024-03-04 (×3): qty 1

## 2024-03-04 MED ORDER — OLOPATADINE HCL 0.1 % OP SOLN
1.0000 [drp] | Freq: Every morning | OPHTHALMIC | Status: DC
  Filled 2024-03-04: qty 5

## 2024-03-04 MED ORDER — ACETAMINOPHEN 650 MG RE SUPP: 650.0000 mg | RECTAL | Status: DC | PRN

## 2024-03-04 MED ORDER — DEXAMETHASONE 4 MG PO TABS
4.0000 mg | ORAL_TABLET | Freq: Four times a day (QID) | ORAL | Status: DC
  Administered 2024-03-04 – 2024-03-05 (×5): 4 mg via ORAL
  Filled 2024-03-04 (×4): qty 1

## 2024-03-04 MED ORDER — MAGNESIUM OXIDE -MG SUPPLEMENT 400 (240 MG) MG PO TABS
400.0000 mg | ORAL_TABLET | Freq: Every day | ORAL | Status: DC
  Administered 2024-03-04 – 2024-03-05 (×2): 400 mg via ORAL
  Filled 2024-03-04 (×2): qty 1

## 2024-03-04 MED ORDER — THROMBIN 5000 UNITS EX KIT
PACK | CUTANEOUS | Status: AC
  Filled 2024-03-04: qty 1

## 2024-03-04 MED ORDER — PROPOFOL 1000 MG/100ML IV EMUL
INTRAVENOUS | Status: AC
  Filled 2024-03-04: qty 100

## 2024-03-04 MED ORDER — GABAPENTIN 300 MG PO CAPS
300.0000 mg | ORAL_CAPSULE | ORAL | Status: AC
  Administered 2024-03-04: 300 mg via ORAL
  Filled 2024-03-04: qty 1

## 2024-03-04 MED ORDER — HYDROCODONE-ACETAMINOPHEN 5-325 MG PO TABS
1.0000 | ORAL_TABLET | ORAL | Status: DC | PRN
  Administered 2024-03-04: 1 via ORAL

## 2024-03-04 MED ORDER — METHOCARBAMOL 1000 MG/10ML IJ SOLN: 500.0000 mg | Freq: Four times a day (QID) | INTRAMUSCULAR | Status: DC | PRN

## 2024-03-04 MED ORDER — FENTANYL CITRATE (PF) 100 MCG/2ML IJ SOLN
INTRAMUSCULAR | Status: AC
  Administered 2024-03-04: 25 ug via INTRAVENOUS
  Filled 2024-03-04: qty 2

## 2024-03-04 MED ORDER — HYDROCODONE-ACETAMINOPHEN 5-325 MG PO TABS
ORAL_TABLET | ORAL | Status: AC
  Filled 2024-03-04: qty 1

## 2024-03-04 MED ORDER — ADULT MULTIVITAMIN W/MINERALS CH
1.0000 | ORAL_TABLET | Freq: Every day | ORAL | Status: DC
  Administered 2024-03-04 – 2024-03-05 (×2): 1 via ORAL
  Filled 2024-03-04 (×2): qty 1

## 2024-03-04 MED ORDER — SODIUM CHLORIDE 0.9% FLUSH
3.0000 mL | Freq: Two times a day (BID) | INTRAVENOUS | Status: DC
  Administered 2024-03-04: 3 mL via INTRAVENOUS

## 2024-03-04 MED ORDER — LACTATED RINGERS IV SOLN: INTRAVENOUS | Status: DC

## 2024-03-04 MED ORDER — DEXAMETHASONE SODIUM PHOSPHATE 10 MG/ML IJ SOLN
INTRAMUSCULAR | Status: DC | PRN
  Administered 2024-03-04: 10 mg via INTRAVENOUS

## 2024-03-04 MED ORDER — ACETAMINOPHEN 10 MG/ML IV SOLN: 1000.0000 mg | Freq: Once | INTRAVENOUS | Status: DC | PRN

## 2024-03-04 MED ORDER — ONDANSETRON HCL 4 MG/2ML IJ SOLN
INTRAMUSCULAR | Status: DC | PRN
  Administered 2024-03-04: 4 mg via INTRAVENOUS

## 2024-03-04 MED ORDER — ASPIRIN 81 MG PO TBEC
81.0000 mg | DELAYED_RELEASE_TABLET | Freq: Every day | ORAL | Status: DC
  Administered 2024-03-05: 81 mg via ORAL
  Filled 2024-03-04: qty 1

## 2024-03-04 MED ORDER — THROMBIN 5000 UNITS EX SOLR
OROMUCOSAL | Status: DC | PRN
  Administered 2024-03-04: 5 mL via TOPICAL

## 2024-03-04 MED ORDER — CHLORHEXIDINE GLUCONATE CLOTH 2 % EX PADS: 6.0000 | MEDICATED_PAD | Freq: Once | CUTANEOUS | Status: DC

## 2024-03-04 MED ORDER — METHOCARBAMOL 500 MG PO TABS
500.0000 mg | ORAL_TABLET | Freq: Four times a day (QID) | ORAL | Status: DC | PRN
  Administered 2024-03-04 (×2): 500 mg via ORAL
  Filled 2024-03-04: qty 1

## 2024-03-04 MED ORDER — FENOFIBRATE 54 MG PO TABS
54.0000 mg | ORAL_TABLET | Freq: Every day | ORAL | Status: DC
  Administered 2024-03-05: 54 mg via ORAL
  Filled 2024-03-04 (×2): qty 1

## 2024-03-04 MED ORDER — ZINC SULFATE 220 (50 ZN) MG PO CAPS
220.0000 mg | ORAL_CAPSULE | Freq: Every day | ORAL | Status: DC
  Administered 2024-03-04 – 2024-03-05 (×2): 220 mg via ORAL
  Filled 2024-03-04 (×3): qty 1

## 2024-03-04 MED ORDER — THROMBIN 20000 UNITS EX SOLR
CUTANEOUS | Status: AC
  Filled 2024-03-04: qty 20000

## 2024-03-04 MED ORDER — BUPIVACAINE HCL (PF) 0.25 % IJ SOLN
INTRAMUSCULAR | Status: DC | PRN
  Administered 2024-03-04: 10 mL

## 2024-03-04 MED ORDER — MENTHOL 3 MG MT LOZG: 1.0000 | LOZENGE | OROMUCOSAL | Status: DC | PRN

## 2024-03-04 SURGICAL SUPPLY — 59 items
BAG COUNTER SPONGE SURGICOUNT (BAG) ×4 IMPLANT
BAND RUBBER #18 3X1/16 STRL (MISCELLANEOUS) ×4 IMPLANT
BASKET BONE COLLECTION (BASKET) IMPLANT
BENZOIN TINCTURE PRP APPL 2/3 (GAUZE/BANDAGES/DRESSINGS) ×4 IMPLANT
BIT DRILL PLIF MAS DISP 5.5MM (DRILL) IMPLANT
BLADE BONE MILL MEDIUM (MISCELLANEOUS) IMPLANT
BLADE CLIPPER SURG (BLADE) IMPLANT
BUR CARBIDE MATCH 3.0 (BURR) ×4 IMPLANT
CANISTER SUCT 3000ML PPV (MISCELLANEOUS) ×4 IMPLANT
CLEANSER WND VASHE 34 (WOUND CARE) ×2 IMPLANT
CNTNR URN SCR LID CUP LEK RST (MISCELLANEOUS) ×2 IMPLANT
COVER BACK TABLE 60X90IN (DRAPES) ×2 IMPLANT
DRAPE C-ARM 42X72 X-RAY (DRAPES) IMPLANT
DRAPE LAPAROTOMY 100X72X124 (DRAPES) ×4 IMPLANT
DRAPE MICROSCOPE SLANT 54X150 (MISCELLANEOUS) ×2 IMPLANT
DRAPE SURG 17X23 STRL (DRAPES) ×4 IMPLANT
DRILL PLIF MAS DISP 5.5MM (DRILL) ×1 IMPLANT
DRSG OPSITE POSTOP 4X6 (GAUZE/BANDAGES/DRESSINGS) IMPLANT
DURAPREP 26ML APPLICATOR (WOUND CARE) ×4 IMPLANT
ELECT REM PT RETURN 9FT ADLT (ELECTROSURGICAL) ×1 IMPLANT
ELECTRODE REM PT RTRN 9FT ADLT (ELECTROSURGICAL) ×4 IMPLANT
EVACUATOR 1/8 PVC DRAIN (DRAIN) IMPLANT
FIBER BONE ALLOSYNC EXPAND 10 (Bone Implant) IMPLANT
GAUZE 4X4 16PLY ~~LOC~~+RFID DBL (SPONGE) IMPLANT
GLOVE BIO SURGEON STRL SZ7 (GLOVE) IMPLANT
GLOVE BIO SURGEON STRL SZ8 (GLOVE) ×6 IMPLANT
GLOVE BIOGEL PI IND STRL 7.0 (GLOVE) IMPLANT
GOWN STRL REUS W/ TWL LRG LVL3 (GOWN DISPOSABLE) IMPLANT
GOWN STRL REUS W/ TWL XL LVL3 (GOWN DISPOSABLE) ×6 IMPLANT
GOWN STRL REUS W/TWL 2XL LVL3 (GOWN DISPOSABLE) IMPLANT
GRAFT BN 5X1XSPNE CVD POST DBM (Bone Implant) IMPLANT
HEMOSTAT POWDER KIT SURGIFOAM (HEMOSTASIS) ×2 IMPLANT
KIT BASIN OR (CUSTOM PROCEDURE TRAY) ×4 IMPLANT
KIT INFUSE SMALL (Orthopedic Implant) IMPLANT
KIT TURNOVER KIT B (KITS) ×4 IMPLANT
MILL BONE PREP (MISCELLANEOUS) IMPLANT
NDL HYPO 25X1 1.5 SAFETY (NEEDLE) ×4 IMPLANT
NDL SPNL 20GX3.5 QUINCKE YW (NEEDLE) IMPLANT
NEEDLE HYPO 25X1 1.5 SAFETY (NEEDLE) ×1 IMPLANT
NEEDLE SPNL 20GX3.5 QUINCKE YW (NEEDLE) IMPLANT
NS IRRIG 1000ML POUR BTL (IV SOLUTION) ×4 IMPLANT
PACK LAMINECTOMY NEURO (CUSTOM PROCEDURE TRAY) ×4 IMPLANT
PAD ARMBOARD POSITIONER FOAM (MISCELLANEOUS) ×12 IMPLANT
ROD 5.5X40MM (Rod) IMPLANT
SCREW LOCK FXNS SPNE MAS PL (Screw) IMPLANT
SCREW SHANK 5.5X40MM (Screw) IMPLANT
SCREW TULIP 5.5 (Screw) IMPLANT
SPONGE SURGIFOAM ABS GEL 100 (HEMOSTASIS) ×2 IMPLANT
SPONGE SURGIFOAM ABS GEL SZ50 (HEMOSTASIS) ×2 IMPLANT
SPONGE T-LAP 4X18 ~~LOC~~+RFID (SPONGE) IMPLANT
STRIP CLOSURE SKIN 1/2X4 (GAUZE/BANDAGES/DRESSINGS) ×6 IMPLANT
SUT VIC AB 0 CT1 18XCR BRD8 (SUTURE) ×4 IMPLANT
SUT VIC AB 2-0 CP2 18 (SUTURE) ×4 IMPLANT
SUT VIC AB 3-0 SH 8-18 (SUTURE) ×6 IMPLANT
TOWEL GREEN STERILE (TOWEL DISPOSABLE) ×4 IMPLANT
TOWEL GREEN STERILE FF (TOWEL DISPOSABLE) ×4 IMPLANT
TRAY FOLEY MTR SLVR 14FR STAT (SET/KITS/TRAYS/PACK) IMPLANT
TRAY FOLEY MTR SLVR 16FR STAT (SET/KITS/TRAYS/PACK) IMPLANT
WATER STERILE IRR 1000ML POUR (IV SOLUTION) ×4 IMPLANT

## 2024-03-04 NOTE — Progress Notes (Signed)
 Orthopedic Tech Progress Note Patient Details:  Lisa [name], Dr. Oct 31, 1941 [id number]  Ortho Devices Type of Ortho Device: Lumbar corsett Ortho Device/Splint Interventions: Ordered, Application, Adjustment   Post Interventions Patient Tolerated: Well Instructions Provided: Adjustment of device  Tonye Pearson 03/04/2024, 5:32 PM

## 2024-03-04 NOTE — H&P (Signed)
 Subjective: Patient is a 83 y.o. female admitted for back and leg pain. Onset of symptoms was several months ago, gradually worsening since that time.  The pain is rated severe, and is located at the across the lower back and radiates to R leg >> L. The pain is described as aching and occurs all day. The symptoms have been progressive. Symptoms are exacerbated by exercise. MRI or CT showed adjacent level degeneration L1-3   Past Medical History:  Diagnosis Date   Acne rosacea    Allergic rhinitis    Chronic kidney disease    kidney stones, Nov. 2013- tx with Lithotripsy, still has some present    Coronary artery disease    CABG 2023   Cough variant asthma    Degenerative disc disease    knees, ankles, back- OA   GERD (gastroesophageal reflux disease)    Headache(784.0)    since stopping aleve- 07/30/2013   Hyperlipemia    dyslipidemia   Hypertension    [facility] cardiac pharmacist follows pt. Riki Rusk Smart ,has never taken anti-hypertensive   Iron deficiency anemia    Murmur    mild mitral regurgitation 09/09/22   Osteoarthritis    Plantar fasciitis    (using orthotics-they are wearing out)- Dr Luther Bradley   Pneumonia    double pneumonia - relative to sinus infection , had chemical cauterization    PONV (postoperative nausea and vomiting)    urgency, N&V for days, memory & processing   RSV (respiratory syncytial virus infection) 2023   prior to CABG   Sleep apnea    Bringing cpap mask and tubing, study last done- 2006   Stroke [facility])    mild (only affected right side of mouth per pt)    Past Surgical History:  Procedure Laterality Date   ABDOMINAL HYSTERECTOMY  1990's   BIOPSY  05/19/2023   Procedure: BIOPSY;  Surgeon: Lynann Bologna, DO;  [facility];  Service: Gastroenterology;;   BLADDER REPAIR     BLADDER SUSPENSION  90's   COLONOSCOPY WITH PROPOFOL N/A 05/19/2023   Procedure: COLONOSCOPY WITH PROPOFOL;  Surgeon: Lynann Bologna, DO;  [facility];   Service: Gastroenterology;  Laterality: N/A;   CORONARY ARTERY BYPASS GRAFT N/A 08/26/2022   Procedure: CORONARY ARTERY BYPASS GRAFTING (CABG) X THREE, USING LEFT INTERNAL MAMMARY ARTERY AND RIGHT LEG GREATER SAPHENOUS VEIN HARVESTED ENDOSCOPICALLY;  Surgeon: Lovett Sox, MD;  Location: [facility] OR;  Service: Open Heart Surgery;  Laterality: N/A;   ESOPHAGOGASTRODUODENOSCOPY (EGD) WITH PROPOFOL N/A 05/19/2023   Procedure: ESOPHAGOGASTRODUODENOSCOPY (EGD) WITH PROPOFOL;  Surgeon: Lynann Bologna, DO;  [facility];  Service: Gastroenterology;  Laterality: N/A;   LEFT HEART CATH AND CORONARY ANGIOGRAPHY N/A 08/20/2022   Procedure: LEFT HEART CATH AND CORONARY ANGIOGRAPHY;  Surgeon: Iran Ouch, MD;  [facility];  Service: Cardiovascular;  Laterality: N/A;   MAXIMUM ACCESS (MAS)POSTERIOR LUMBAR INTERBODY FUSION (PLIF) 2 LEVEL N/A 08/12/2013   Procedure: FOR MAXIMUM ACCESS (MAS) POSTERIOR LUMBAR INTERBODY FUSION (PLIF) 2 LEVEL;  Surgeon: Tia Alert, MD;  [facility];  Service: Neurosurgery;  Laterality: N/A;  FOR MAXIMUM ACCESS (MAS) POSTERIOR LUMBAR INTERBODY FUSION (PLIF) 2 LEVEL   MULTIPLE TOOTH EXTRACTIONS     wisdom teeth extractions    POLYPECTOMY  05/19/2023   Procedure: POLYPECTOMY;  Surgeon: Lynann Bologna, DO;  [facility];  Service: Gastroenterology;;   POSTERIOR FUSION LUMBAR SPINE  08/12/2013   Dr Yetta Barre   TEE  WITHOUT CARDIOVERSION N/A 08/26/2022   Procedure: TRANSESOPHAGEAL ECHOCARDIOGRAM (TEE);  Surgeon: Lovett Sox, MD;  Location: [facility] OR;  Service: Open Heart Surgery;  Laterality: N/A;   TONSILLECTOMY      Prior to Admission medications   Medication Sig Start Date End Date Taking? Authorizing Provider  acetaminophen (TYLENOL) 500 MG tablet Take 1-2 tablets (500-1,000 mg total) by mouth every 6 (six) hours as needed for mild pain or fever. 1000 mg in the morning and at night  Additional 1000 mg daily at mid day if  needed. Patient taking differently: Take 1,000 mg by mouth in the morning and at bedtime. May take a third 1000 mg dose during the day as needed for pain 09/01/22  Yes Barrett, Erin R, PA-C  alendronate (FOSAMAX) 70 MG tablet Take 70 mg by mouth once a week. Take with a full glass of water on an empty stomach.  Saturday Morning   Yes [provider]  aspirin EC 81 MG tablet Take 1 tablet (81 mg total) by mouth daily. 10/24/22  Yes Agbor-Etang, Arlys John, MD  atorvastatin (LIPITOR) 40 MG tablet Take 40 mg by mouth daily.   Yes [provider]  Cholecalciferol (VITAMIN D-3) 125 MCG (5000 UT) TABS Take 5,000 Units by mouth daily.   Yes [provider]  clotrimazole-betamethasone (LOTRISONE) cream Apply 1 Application topically 2 (two) times daily. 06/29/23  Yes [provider]  Cobalamin Combinations (NEURIVA PLUS PO) Take 1 capsule by mouth daily.   Yes [provider]  fenofibrate (TRICOR) 145 MG tablet Take 145 mg by mouth daily.   Yes [provider]  ferrous sulfate 324 MG TBEC Take 324 mg by mouth 4 (four) times a week. [name], [name], and Sat   Yes [provider]  FIBER PO Take 1 capsule by mouth daily.   Yes [provider]  fluticasone (FLONASE) 50 MCG/ACT nasal spray Place 2 sprays into the nose daily.   Yes [provider]  LIDOCAINE-MENTHOL, SPRAY, EX Apply 1 Application topically daily as needed (Back pain).   Yes [provider]  Magnesium 250 MG CAPS Take 250 mg by mouth daily.   Yes [provider]  metoprolol tartrate (LOPRESSOR) 25 MG tablet Take 0.5 tablets (12.5 mg total) by mouth 2 (two) times daily. 12/29/23  Yes Agbor-Etang, Arlys John, MD  Multiple Vitamin (MULTIVITAMIN WITH MINERALS) TABS Take 1 tablet by mouth daily. Centrum Silver Women 50+   Yes [provider]  Multiple Vitamins-Minerals (EMERGEN-C IMMUNE PLUS/VIT D PO) Take 750 mg by mouth daily. Gummy   Yes [provider]  MYRBETRIQ 50 MG TB24 tablet Take 50 mg by mouth at bedtime. 02/25/22  Yes [provider]  Olopatadine HCl (PATADAY) 0.7 % SOLN Place 1 drop into both eyes in the morning.   Yes [provider]  omeprazole (PRILOSEC OTC) 20 MG tablet Take 20 mg by mouth daily as needed (acid reflux).   Yes [provider]  Polyethyl Glycol-Propyl Glycol (SYSTANE OP) Place 1 drop into both eyes daily as needed (Dry eyes).   Yes [provider]  TURMERIC PO Take 1,000 mg by mouth 2 (two) times daily.   Yes [provider]  zinc gluconate 50 MG tablet Take 50 mg by mouth daily.   Yes [provider]   Allergies  Allergen Reactions   Other Nausea And Vomiting    Anesthesia causes nausea and vomiting, memory difficulty and problems processing.   Oxycodone Nausea Only  Sulfa Antibiotics Rash   Sulfasalazine Rash    Social History   Tobacco Use   Smoking status: Never   Smokeless tobacco: Never  Substance Use Topics   Alcohol use: Yes    Comment: on occassion, maybe 1 glass of wine once a month    Family History  Problem Relation Age of Onset   Heart Problems Mother    Heart Problems Father    Heart attack Father    CVA Father    Heart disease Father      Review of Systems  Positive ROS: neg  All other systems have been reviewed and were otherwise negative with the exception of those mentioned in the HPI and as above.  Objective: Vital signs in last 24 hours: Temp:  [98.3 F (36.8 C)] 98.3 F (36.8 C) (03/14 0546) Pulse Rate:  [65] 65 (03/14 0546) Resp:  [18] 18 (03/14 0546) BP: (158)/(72) 158/72 (03/14 0546) SpO2:  [97 %] 97 % (03/14 0546) Weight:  [81.6 kg] 81.6 kg (03/14 0546)  General Appearance: Alert, cooperative, no distress, appears stated age Head: Normocephalic, without obvious abnormality, atraumatic Eyes: PERRL, conjunctiva/corneas clear, EOM's intact    Neck: Supple, symmetrical, trachea midline Back: Symmetric, no  curvature, ROM normal, no CVA tenderness Lungs:  respirations unlabored Heart: Regular rate and rhythm Abdomen: Soft, non-tender Extremities: Extremities normal, atraumatic, no cyanosis or edema Pulses: 2+ and symmetric all extremities Skin: Skin color, texture, turgor normal, no rashes or lesions  NEUROLOGIC:   Mental status: Alert and oriented x4,  no aphasia, good attention span, fund of knowledge, and memory Motor Exam - grossly normal Sensory Exam - grossly normal Reflexes: 1+ Coordination - grossly normal Gait - grossly normal Balance - grossly normal Cranial Nerves: I: smell Not tested  II: visual acuity  OS: nl    OD: nl  II: visual fields Full to confrontation  II: pupils Equal, round, reactive to light  III,VII: ptosis None  III,IV,VI: extraocular muscles  Full ROM  V: mastication Normal  V: facial light touch sensation  Normal  V,VII: corneal reflex  Present  VII: facial muscle function - upper  Normal  VII: facial muscle function - lower Normal  VIII: hearing Not tested  IX: soft palate elevation  Normal  IX,X: gag reflex Present  XI: trapezius strength  5/5  XI: sternocleidomastoid strength 5/5  XI: neck flexion strength  5/5  XII: tongue strength  Normal    Data Review Lab Results  Component Value Date   WBC 6.4 02/26/2024   HGB 11.8 (L) 02/26/2024   HCT 36.4 02/26/2024   MCV 93.8 02/26/2024   PLT 239 02/26/2024   Lab Results  Component Value Date   NA 143 02/26/2024   K 4.2 02/26/2024   CL 106 02/26/2024   CO2 25 02/26/2024   BUN 34 (H) 02/26/2024   CREATININE 1.64 (H) 02/26/2024   GLUCOSE 113 (H) 02/26/2024   Lab Results  Component Value Date   INR 1.0 02/26/2024    Assessment/Plan:  Estimated body mass index is 31.89 kg/m as calculated from the following:   Height as of this encounter: 5\' 3"  (1.6 m).   Weight as of this encounter: 81.6 kg. Patient admitted for DLL and instrumented fusion L1-3 with removal of hardware L3-5. Patient  has failed a reasonable attempt at conservative therapy.  I explained the condition and procedure to the patient and answered any questions.  Patient wishes to proceed with procedure as planned. Understands risks/ benefits  and typical outcomes of procedure.   Tia Alert 03/04/2024 7:22 AM

## 2024-03-04 NOTE — Progress Notes (Signed)
 Patient to transfer to 5N32. Report given to Receiving RN. Patient in no signs of distress at this time. Dressing was noted to be stained at the time of transfer and extra supplies given for dressing change.

## 2024-03-04 NOTE — Transfer of Care (Signed)
 Immediate Anesthesia Transfer of Care Note  Patient: [name], Dr.  Nigel Bridgeman) Performed: Laminectomy and Foraminotomy - Lumbar one-Lumbar two- Lumbar two-Lumbar three, posterolateral fusion Lumbar two-three with non-segmental fixation removal old hardware lumbar three-five (Back) Removal hardware L3-5 (Back)  Patient Location: PACU  Anesthesia Type:General  Level of Consciousness: awake  Airway & Oxygen Therapy: Patient connected to face mask oxygen  Post-op Assessment: Post -op Vital signs reviewed and stable  Post vital signs: stable  Last Vitals:  Vitals Value Taken Time  BP 141/65 03/04/24 1045  Temp    Pulse 70 03/04/24 1046  Resp 0 03/04/24 1046  SpO2 99 % 03/04/24 1046  Vitals shown include unfiled device data.  Last Pain:  Vitals:   03/04/24 0608  TempSrc:   PainSc: 5       Patients Stated Pain Goal: 0 (03/04/24 3875)  Complications: There were no known notable events for this encounter.

## 2024-03-04 NOTE — Anesthesia Procedure Notes (Signed)
 Procedure Name: Intubation Date/Time: 03/04/2024 7:51 AM  Performed by: Hessie Diener, CRNAPre-anesthesia Checklist: Patient identified, Emergency Drugs available, Suction available and Patient being monitored Patient Re-evaluated:Patient Re-evaluated prior to induction Oxygen Delivery Method: Circle System Utilized Preoxygenation: Pre-oxygenation with 100% oxygen Induction Type: IV induction Ventilation: Mask ventilation without difficulty Laryngoscope Size: Mac and 3 Grade View: Grade III Tube type: Oral Tube size: 7.0 mm Number of attempts: 1 Airway Equipment and Method: Stylet and Oral airway Placement Confirmation: ETT inserted through vocal cords under direct vision, positive ETCO2 and breath sounds checked- equal and bilateral Secured at: 21 cm Tube secured with: Tape Dental Injury: Teeth and Oropharynx as per pre-operative assessment

## 2024-03-04 NOTE — Op Note (Signed)
 03/04/2024  10:37 AM  PATIENT:  Lisa [name], Dr.  83 y.o. 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 Decompressive lumbar laminectomy, medial facetectomy and foraminotomies L1-2 and L2-3 for central canal and lateral recess decompression 2. Posterior fixation L2-3 using NuVasive cortical pedicle screws.  3.  Removal of segmental fixation L3-L5 4. Intertransverse arthrodesis L2-3 using morcellized autograft and allograft.  SURGEON:  Marikay Alar, MD  ASSISTANTS: Verlin Dike, FNP  ANESTHESIA:  General  EBL: 20 ml  Total I/O In: -  Out: 270 [Urine:250; Blood:20]  BLOOD ADMINISTERED:none  DRAINS: none   INDICATION FOR PROCEDURE: This patient presented with back pain with radiculopathy. Imaging revealed spinal stenosis L1-2 and L2-3 above previous successful L3-L5 fusion. The patient tried a reasonable attempt at conservative medical measures without relief. I recommended decompression and instrumented fusion to address the stenosis as well as the segmental  instability.  Patient understood the risks, benefits, and alternatives and potential outcomes and wished to proceed.  PROCEDURE DETAILS:  The patient was brought to the operating room. After induction of generalized endotracheal anesthesia the patient was rolled into the prone position on chest rolls and all pressure points were padded. The patient's lumbar region was cleaned and then prepped with DuraPrep and draped in the usual sterile fashion. Anesthesia was injected and then a dorsal midline incision was made and carried down to the lumbosacral fascia. The fascia was opened and the paraspinous musculature was taken down in a subperiosteal fashion to expose L1-2 and L2-3 as well as the previously placed instrumentation. A self-retaining retractor was placed.  I remove the  locking caps from the previously placed instrumentation and remove the rods.  The screws had excellent purchase.  Intraoperative fluoroscopy confirmed my level, and I started with placement of the L2 cortical pedicle screws. The pedicle screw entry zones were identified utilizing surface landmarks and  AP and lateral fluoroscopy. I scored the cortex with the high-speed drill and then used the hand drill to drill an upward and outward direction into the pedicle. I then tapped line to line. I then placed a 5.5 x 40 mm cortical pedicle screw into the pedicles of L2 bilaterally.    I then turned my attention to the decompression and complete lumbar laminectomies, medial facetectomies, and foraminotomies were performed at L1 2 and L2-3 after removal of the spinous process of L2.  My nurse practitioner was directly involved in the decompression and exposure of the neural elements. the patient had significant spinal stenosis.The yellow ligament was removed to expose the underlying dura and nerve roots, and generous foraminotomies were performed to adequately decompress the neural elements. Both the exiting and traversing nerve roots were decompressed on both sides until a coronary dilator passed easily along the nerve roots.    We then decorticated the transverse processes and laid a mixture of morcellized autograft and allograft out over these to perform intertransverse arthrodesis at L2-3 bilaterally. We then placed lordotic rods into the multiaxial screw heads of the pedicle screws L2 and L3 and locked these in position with the locking caps and anti-torque device. We then checked our construct with AP and lateral fluoroscopy. Irrigated with copious amounts of  saline solution. Inspected the nerve roots once again to assure adequate decompression, lined to the dura with Gelfoam,  and then we closed the muscle and the fascia with  0 Vicryl. Closed the subcutaneous tissues with 2-0 Vicryl and subcuticular tissues with  3-0 Vicryl. The skin was closed with benzoin and Steri-Strips. Dressing was then applied, the patient was awakened from general anesthesia and transported to the recovery room in stable condition. At the end of the procedure all sponge, needle and instrument counts were correct.   PLAN OF CARE: admit to inpatient  PATIENT DISPOSITION:  PACU - hemodynamically stable.   Delay start of Pharmacological VTE agent (>24hrs) due to surgical blood loss or risk of bleeding:  yes

## 2024-03-05 DIAGNOSIS — I129 Hypertensive chronic kidney disease with stage 1 through stage 4 chronic kidney disease, or unspecified chronic kidney disease: Secondary | ICD-10-CM | POA: Diagnosis not present

## 2024-03-05 DIAGNOSIS — I2510 Atherosclerotic heart disease of native coronary artery without angina pectoris: Secondary | ICD-10-CM | POA: Diagnosis not present

## 2024-03-05 DIAGNOSIS — Z8673 Personal history of transient ischemic attack (TIA), and cerebral infarction without residual deficits: Secondary | ICD-10-CM | POA: Diagnosis not present

## 2024-03-05 DIAGNOSIS — M48061 Spinal stenosis, lumbar region without neurogenic claudication: Secondary | ICD-10-CM | POA: Diagnosis not present

## 2024-03-05 DIAGNOSIS — Z951 Presence of aortocoronary bypass graft: Secondary | ICD-10-CM | POA: Diagnosis not present

## 2024-03-05 DIAGNOSIS — M4726 Other spondylosis with radiculopathy, lumbar region: Secondary | ICD-10-CM | POA: Diagnosis not present

## 2024-03-05 DIAGNOSIS — Z79899 Other long term (current) drug therapy: Secondary | ICD-10-CM | POA: Diagnosis not present

## 2024-03-05 DIAGNOSIS — Z7982 Long term (current) use of aspirin: Secondary | ICD-10-CM | POA: Diagnosis not present

## 2024-03-05 DIAGNOSIS — N189 Chronic kidney disease, unspecified: Secondary | ICD-10-CM | POA: Diagnosis not present

## 2024-03-05 MED ORDER — METHOCARBAMOL 500 MG PO TABS: 500.0000 mg | ORAL_TABLET | Freq: Four times a day (QID) | ORAL | 0 refills | Status: AC | PRN

## 2024-03-05 MED ORDER — HYDROCODONE-ACETAMINOPHEN 5-325 MG PO TABS
1.0000 | ORAL_TABLET | Freq: Four times a day (QID) | ORAL | 0 refills | Status: AC | PRN
Start: 2024-03-05 — End: 2024-03-12

## 2024-03-05 NOTE — Evaluation (Signed)
 Physical Therapy Evaluation Patient Details Name: Lisa [name], Dr. MRN: [id number] DOB: 24-Jun-1941 Today's Date: 03/05/2024  History of Present Illness  Pt is an 83 y/o F s/p PLIF L2-3 on 3/14. PMH includes CKD, CAD, GERD, HLD, HTN, OA  Clinical Impression  Patient presents with mild dependencies in gait and mobility, mostly limited by pain, generalized weakness and decreased balance.  Patient will benefit from continued PT after discharge.  Patient does report she had an episode of "sleep walking" last night and woke up out of the bed and losing her balance.  With that in mind, recommend  24 hour supervision for first 24 - 48 hours at home.  Will follow while in hospital.        If plan is discharge home, recommend the following: A little help with bathing/dressing/bathroom;Assistance with cooking/housework;Assist for transportation;A little help with walking and/or transfers   Can travel by private vehicle        Equipment Recommendations None recommended by PT  Recommendations for Other Services       Functional Status Assessment Patient has had a recent decline in their functional status and demonstrates the ability to make significant improvements in function in a reasonable and predictable amount of time.     Precautions / Restrictions Precautions Precautions: Back Recall of Precautions/Restrictions: Intact Precaution/Restrictions Comments: able to recall 3/3 precautions Required Braces or Orthoses: Spinal Brace Spinal Brace: Other (comment) (on upon PT arrival)      Mobility  Bed Mobility               General bed mobility comments: in recliner upon arrival    Transfers Overall transfer level: Needs assistance Equipment used: Rolling walker (2 wheels) Transfers: Sit to/from Stand Sit to Stand: Supervision                Ambulation/Gait Ambulation/Gait assistance: Supervision Gait Distance (Feet): 150 Feet Assistive device: Rolling walker (2  wheels) Gait Pattern/deviations: Step-through pattern Gait velocity: decreased        Stairs            Wheelchair Mobility     Tilt Bed    Modified Rankin (Stroke Patients Only)       Balance Overall balance assessment: Needs assistance Sitting-balance support: Feet supported Sitting balance-Leahy Scale: Good     Standing balance support: Reliant on assistive device for balance, During functional activity                                 Pertinent Vitals/Pain Pain Assessment Pain Assessment: 0-10 Pain Score: 7  Pain Location: back Pain Descriptors / Indicators: Discomfort, Guarding, Operative site guarding    Home Living Family/patient expects to be discharged to:: Private residence ([facility]) Living Arrangements: Alone;Other (Comment) (daughter plans to stay 2 weeks) Available Help at Discharge: Friend(s);Family;Other (Comment);Available PRN/intermittently (daughter is planning to stay 2 weeks) Type of Home: House Home Access: Level entry     Alternate Level Stairs-Number of Steps: flight but does not have to go upstairs Home Layout: Able to live on main level with bedroom/bathroom;Two level Home Equipment: Shower seat;Grab bars - tub/shower;Cane - single Librarian, academic (2 wheels);Rollator (4 wheels);Lift chair;Adaptive equipment (bidet)      Prior Function Prior Level of Function : Independent/Modified Independent;Driving             Mobility Comments: uses cane ADLs Comments: ind,     Extremity/Trunk Assessment  Upper Extremity Assessment Upper Extremity Assessment: Defer to OT evaluation    Lower Extremity Assessment Lower Extremity Assessment: Overall WFL for tasks assessed (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)    Cervical / Trunk Assessment Cervical / Trunk Assessment: Back Surgery  Communication   Communication Communication: No apparent difficulties    Cognition Arousal: Alert Behavior  During Therapy: WFL for tasks assessed/performed   PT - Cognitive impairments: No apparent impairments                                 Cueing       General Comments General comments (skin integrity, edema, etc.): VSS    Exercises General Exercises - Lower Extremity Ankle Circles/Pumps: AROM, Both, 10 reps Long Arc Quad: AROM, Both, 10 reps   Assessment/Plan    PT Assessment Patient needs continued PT services  PT Problem List Decreased activity tolerance;Decreased balance;Decreased mobility;Pain       PT Treatment Interventions DME instruction;Gait training;Therapeutic activities;Functional mobility training;Balance training;Therapeutic exercise;Patient/family education    PT Goals (Current goals can be found in the Care Plan section)  Acute Rehab PT Goals Patient Stated Goal: walk the trails even if just a short distance PT Goal Formulation: With patient Time For Goal Achievement: 03/12/24 Potential to Achieve Goals: Good    Frequency Min 3X/week     Co-evaluation               AM-PAC PT "6 Clicks" Mobility  Outcome Measure Help needed turning from your back to your side while in a flat bed without using bedrails?: A Little Help needed moving from lying on your back to sitting on the side of a flat bed without using bedrails?: A Little Help needed moving to and from a bed to a chair (including a wheelchair)?: None Help needed standing up from a chair using your arms (e.g., wheelchair or bedside chair)?: None Help needed to walk in hospital room?: None Help needed climbing 3-5 steps with a railing? : A Little 6 Click Score: 21    End of Session Equipment Utilized During Treatment: Back brace Activity Tolerance: Patient tolerated treatment well Patient left: in chair;with call bell/phone within reach   PT Visit Diagnosis: Muscle weakness (generalized) (M62.81);Other abnormalities of gait and mobility (R26.89)    Time: 4098-1191 PT Time  Calculation (min) (ACUTE ONLY): 20 min   Charges:   PT Evaluation $PT Eval Moderate Complexity: 1 Mod   PT General Charges $$ ACUTE PT VISIT: 1 Visit         03/05/2024 Delray Alt, PT Acute Rehabilitation Services Office:  [phone]   Olivia Canter 03/05/2024, 9:47 AM

## 2024-03-05 NOTE — Plan of Care (Signed)

## 2024-03-05 NOTE — Discharge Summary (Signed)
 Physician Discharge Summary  Patient ID: Lisa [name], Dr. MRN: [id number] DOB/AGE: July 08, 1941 83 y.o.  Admit date: 03/04/2024 Discharge date: 03/05/2024  Admission Diagnoses:  Lumbar spondylosis  Discharge Diagnoses:  Same Principal Problem:   S/P lumbar fusion   Discharged Condition: Stable  Hospital Course:  Lisa [name], Dr. is a 83 y.o. female admitted after elective decompression/fusion. She was at neurologic baseline, ambulating with walker, voiding normally and requested d/c home.  Treatments: Surgery - L1-3 decompression/posterolateral fusion  Discharge Exam: Blood pressure (!) 122/59, pulse 63, temperature 98.3 F (36.8 C), resp. rate 18, height 5\' 3"  (1.6 m), weight 81.6 kg, SpO2 94%. Awake, alert, oriented Speech fluent, appropriate CN grossly intact 5/5 BUE/BLE Wound c/d/i  Disposition: Discharge disposition: 01-Home or Self Care       Discharge Instructions     Call MD for:  redness, tenderness, or signs of infection (pain, swelling, redness, odor or green/yellow discharge around incision site)   Complete by: As directed    Call MD for:  temperature >100.4   Complete by: As directed    Diet - low sodium heart healthy   Complete by: As directed    Discharge instructions   Complete by: As directed    Walk at home as much as possible, at least 4 times / day   Increase activity slowly   Complete by: As directed    Lifting restrictions   Complete by: As directed    No lifting > 10 lbs   May shower / Bathe   Complete by: As directed    48 hours after surgery   May walk up steps   Complete by: As directed    Other Restrictions   Complete by: As directed    No bending/twisting at waist   Remove dressing in 48 hours   Complete by: As directed       Allergies as of 03/05/2024       Reactions   Other Nausea And Vomiting   Anesthesia causes nausea and vomiting, memory difficulty and problems processing.   Oxycodone Nausea Only   Sulfa  Antibiotics Rash   Sulfasalazine Rash        Medication List     PAUSE taking these medications    aspirin EC 81 MG tablet Wait to take this until: March 08, 2024 Take 1 tablet (81 mg total) by mouth daily.       TAKE these medications    acetaminophen 500 MG tablet Commonly known as: TYLENOL Take 1-2 tablets (500-1,000 mg total) by mouth every 6 (six) hours as needed for mild pain or fever. 1000 mg in the morning and at night  Additional 1000 mg daily at mid day if needed. What changed:  how much to take when to take this additional instructions   alendronate 70 MG tablet Commonly known as: FOSAMAX Take 70 mg by mouth once a week. Take with a full glass of water on an empty stomach.  Saturday Morning   atorvastatin 40 MG tablet Commonly known as: LIPITOR Take 40 mg by mouth daily.   clotrimazole-betamethasone cream Commonly known as: LOTRISONE Apply 1 Application topically 2 (two) times daily.   EMERGEN-C IMMUNE PLUS/VIT D PO Take 750 mg by mouth daily. Gummy   fenofibrate 145 MG tablet Commonly known as: TRICOR Take 145 mg by mouth daily.   ferrous sulfate 324 MG Tbec Take 324 mg by mouth 4 (four) times a week. [name], [name], and Sat   FIBER  PO Take 1 capsule by mouth daily.   fluticasone 50 MCG/ACT nasal spray Commonly known as: FLONASE Place 2 sprays into the nose daily.   HYDROcodone-acetaminophen 5-325 MG tablet Commonly known as: NORCO/VICODIN Take 1 tablet by mouth every 6 (six) hours as needed for up to 7 days for moderate pain (pain score 4-6).   LIDOCAINE-MENTHOL (SPRAY) EX Apply 1 Application topically daily as needed (Back pain).   Magnesium 250 MG Caps Take 250 mg by mouth daily.   methocarbamol 500 MG tablet Commonly known as: ROBAXIN Take 1 tablet (500 mg total) by mouth every 6 (six) hours as needed for muscle spasms.   metoprolol tartrate 25 MG tablet Commonly known as: LOPRESSOR Take 0.5 tablets (12.5 mg total) by mouth  2 (two) times daily.   multivitamin with minerals Tabs tablet Take 1 tablet by mouth daily. Centrum Silver Women 50+   Myrbetriq 50 MG Tb24 tablet Generic drug: mirabegron ER Take 50 mg by mouth at bedtime.   NEURIVA PLUS PO Take 1 capsule by mouth daily.   omeprazole 20 MG tablet Commonly known as: PRILOSEC OTC Take 20 mg by mouth daily as needed (acid reflux).   Pataday 0.7 % Soln Generic drug: Olopatadine HCl Place 1 drop into both eyes in the morning.   SYSTANE OP Place 1 drop into both eyes daily as needed (Dry eyes).   TURMERIC PO Take 1,000 mg by mouth 2 (two) times daily.   Vitamin D-3 125 MCG (5000 UT) Tabs Take 5,000 Units by mouth daily.   zinc gluconate 50 MG tablet Take 50 mg by mouth daily.               Durable Medical Equipment  (From admission, onward)           Start     Ordered   03/04/24 1237  DME Walker rolling  Once       Question:  Patient needs a walker to treat with the following condition  Answer:  S/P lumbar fusion   03/04/24 1236   03/04/24 1237  DME 3 n 1  Once        03/04/24 1236            Follow-up Information     Arman Bogus, MD Follow up in 3 week(s).   Specialty: Neurosurgery Contact information: [address] [place] [address] [phone]                 Signed: Jackelyn Hoehn 03/05/2024, 10:25 AM

## 2024-03-05 NOTE — Plan of Care (Signed)
  Problem: Activity: Goal: Risk for activity intolerance will decrease Outcome: Progressing   Problem: Nutrition: Goal: Adequate nutrition will be maintained Outcome: Progressing   Problem: Coping: Goal: Level of anxiety will decrease Outcome: Progressing   Problem: Pain Managment: Goal: General experience of comfort will improve and/or be controlled Outcome: Progressing   Problem: Safety: Goal: Ability to remain free from injury will improve Outcome: Progressing

## 2024-03-05 NOTE — Care Management Obs Status (Signed)
 MEDICARE OBSERVATION STATUS NOTIFICATION   Patient Details  Name: Lisa [name], Dr. MRN: [id number] Date of Birth: [birth year]   Medicare Observation Status Notification Given:  Yes    Ronny Bacon, RN 03/05/2024, 9:44 AM

## 2024-03-05 NOTE — Evaluation (Signed)
 Occupational Therapy Evaluation Patient Details Name: Lisa [name], Dr. MRN: [id number] DOB: 05/05/1941 Today's Date: 03/05/2024   History of Present Illness   Pt is an 83 y/o F s/p PLIF L2-3 on 3/14. PMH includes CKD, CAD, GERD, HLD, HTN, OA     Clinical Impressions Pt ind at baseline with ADLs and uses cane for functional mobility, lives alone in [facility], however states her daughter plans to stay with her the first 2 weeks. Pt currently needing up to min A for ADLs, CGA for bed mobility and CGA for transfers with RW. Pt educated on back precautions, brace wear, and compensatory strategies for ADLs/mobility and handout provided. Pt needing min cues to prevent twisting during ADLs during session. Pt presenting with impairments listed below, will follow acutely. Recommend HHOT at d/c.      If plan is discharge home, recommend the following:   A little help with walking and/or transfers;A little help with bathing/dressing/bathroom;Assistance with cooking/housework;Assist for transportation;Help with stairs or ramp for entrance     Functional Status Assessment   Patient has had a recent decline in their functional status and demonstrates the ability to make significant improvements in function in a reasonable and predictable amount of time.     Equipment Recommendations   None recommended by OT (pt has all needed DME)     Recommendations for Other Services   PT consult     Precautions/Restrictions   Precautions Precautions: Back Precaution Booklet Issued: Yes (comment) Recall of Precautions/Restrictions: Intact Precaution/Restrictions Comments: educated on 3/3 back precautions Restrictions Weight Bearing Restrictions Per Provider Order: No     Mobility Bed Mobility Overal bed mobility: Needs Assistance Bed Mobility: Sidelying to Sit   Sidelying to sit: Contact guard assist            Transfers Overall transfer level: Needs assistance Equipment  used: Rolling walker (2 wheels) Transfers: Sit to/from Stand Sit to Stand: Contact guard assist                  Balance Overall balance assessment: Needs assistance Sitting-balance support: Feet supported Sitting balance-Leahy Scale: Good Sitting balance - Comments: reaches outside BOS without LOB     Standing balance-Leahy Scale: Fair Standing balance comment: static standing without AD and ambulates a few feet without AD                           ADL either performed or assessed with clinical judgement   ADL Overall ADL's : Needs assistance/impaired Eating/Feeding: Set up;Sitting   Grooming: Set up;Standing;Wash/dry hands   Upper Body Bathing: Minimal assistance;Sitting   Lower Body Bathing: Minimal assistance   Upper Body Dressing : Minimal assistance   Lower Body Dressing: Minimal assistance   Toilet Transfer: Contact guard assist;Ambulation;Rolling walker (2 wheels);Regular Toilet   Toileting- Clothing Manipulation and Hygiene: Supervision/safety       Functional mobility during ADLs: Contact guard assist;Rolling walker (2 wheels)       Vision   Vision Assessment?: No apparent visual deficits     Perception Perception: Not tested       Praxis Praxis: Not tested       Pertinent Vitals/Pain Pain Assessment Pain Assessment: Faces Pain Score: 5  Faces Pain Scale: Hurts little more Pain Location: back Pain Descriptors / Indicators: Discomfort Pain Intervention(s): Limited activity within patient's tolerance, Monitored during session, Repositioned     Extremity/Trunk Assessment Upper Extremity Assessment Upper Extremity Assessment: Generalized weakness  Lower Extremity Assessment Lower Extremity Assessment: Defer to PT evaluation   Cervical / Trunk Assessment Cervical / Trunk Assessment: Back Surgery   Communication Communication Communication: No apparent difficulties   Cognition Arousal: Alert Behavior During Therapy: WFL  for tasks assessed/performed Cognition: No apparent impairments                               Following commands: Intact       Cueing  General Comments   Cueing Techniques: Verbal cues  VSS   Exercises     Shoulder Instructions      Home Living Family/patient expects to be discharged to:: Private residence ([facility]) Living Arrangements: Alone;Other (Comment) (daughter plans to stay 2 weeks) Available Help at Discharge: Friend(s);Family;Other (Comment);Available PRN/intermittently (daughter is planning to stay 2 weeks) Type of Home: House Home Access: Level entry     Home Layout: Able to live on main level with bedroom/bathroom;Two level Alternate Level Stairs-Number of Steps: flight but does not have to go upstairs   Bathroom Shower/Tub: Producer, television/film/video: Handicapped height Bathroom Accessibility: Yes   Home Equipment: Shower seat;Grab bars - tub/shower;Cane - single Librarian, academic (2 wheels);Rollator (4 wheels);Lift chair;Adaptive equipment (bidet) Adaptive Equipment: Reacher        Prior Functioning/Environment Prior Level of Function : Independent/Modified Independent;Driving             Mobility Comments: uses cane ADLs Comments: ind,    OT Problem List: Decreased strength;Decreased range of motion;Decreased activity tolerance;Impaired balance (sitting and/or standing);Decreased knowledge of precautions   OT Treatment/Interventions: Self-care/ADL training;Therapeutic exercise;Energy conservation;DME and/or AE instruction;Therapeutic activities;Balance training;Patient/family education      OT Goals(Current goals can be found in the care plan section)   Acute Rehab OT Goals Patient Stated Goal: none stated OT Goal Formulation: With patient Time For Goal Achievement: 03/19/24 Potential to Achieve Goals: Good ADL Goals Pt Will Perform Upper Body Dressing: Independently;sitting Pt Will Perform  Lower Body Dressing: Independently;sit to/from stand;sitting/lateral leans Pt Will Transfer to Toilet: Independently;ambulating;regular height toilet   OT Frequency:  Min 2X/week    Co-evaluation              AM-PAC OT "6 Clicks" Daily Activity     Outcome Measure Help from another person eating meals?: None Help from another person taking care of personal grooming?: A Little Help from another person toileting, which includes using toliet, bedpan, or urinal?: A Little Help from another person bathing (including washing, rinsing, drying)?: A Little Help from another person to put on and taking off regular upper body clothing?: A Little Help from another person to put on and taking off regular lower body clothing?: A Little 6 Click Score: 19   End of Session Equipment Utilized During Treatment: Rolling walker (2 wheels);Back brace Nurse Communication: Mobility status  Activity Tolerance: Patient tolerated treatment well Patient left: in chair;with call bell/phone within reach  OT Visit Diagnosis: Unsteadiness on feet (R26.81);Other abnormalities of gait and mobility (R26.89);Muscle weakness (generalized) (M62.81)                Time: 6295-2841 OT Time Calculation (min): 43 min Charges:  OT General Charges $OT Visit: 1 Visit OT Evaluation $OT Eval Moderate Complexity: 1 Mod OT Treatments $Self Care/Home Management : 23-37 mins  Carver Fila, OTD, OTR/L SecureChat Preferred Acute Rehab [phone]   Lisaanne Lawrie K Koonce 03/05/2024, 9:06 AM

## 2024-03-05 NOTE — Progress Notes (Signed)
  NEUROSURGERY PROGRESS NOTE   Pt reports getting out of bed to use restroom last night but doesn't remember it. Otherwise apporpriate back pain, ambulating with walker well.  EXAM:  BP (!) 122/59 (BP Location: Right Arm)   Pulse 63   Temp 98.3 F (36.8 C)   Resp 18   Ht 5\' 3"  (1.6 m)   Wt 81.6 kg   SpO2 94%   BMI 31.89 kg/m   Awake, alert, oriented  Speech fluent, appropriate  CN grossly intact  5/5 BUE/BLE   IMPRESSION:  83 y.o. female POD#1 L1-3 decompression/PL fusion doing well  PLAN: - Pt requests d/c home today   Lisbeth Renshaw, MD [facility]

## 2024-03-07 ENCOUNTER — Encounter (HOSPITAL_COMMUNITY): Payer: Self-pay | Admitting: Neurological Surgery

## 2024-03-10 NOTE — Anesthesia Postprocedure Evaluation (Signed)
 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) Performed: Laminectomy and Foraminotomy - Lumbar one-Lumbar two- Lumbar two-Lumbar three, posterolateral fusion Lumbar two-three with non-segmental fixation removal old hardware lumbar three-five (Back) Removal hardware L3-5 (Back)     Patient location during evaluation: PACU Anesthesia Type: General Level of consciousness: awake and alert Pain management: pain level controlled Vital Signs Assessment: post-procedure vital signs reviewed and stable Respiratory status: spontaneous breathing, nonlabored ventilation and respiratory function stable Cardiovascular status: blood pressure returned to baseline and stable Postop Assessment: no apparent nausea or vomiting Anesthetic complications: no   There were no known notable events for this encounter.                Recardo Linn

## 2024-03-23 DIAGNOSIS — K5900 Constipation, unspecified: Secondary | ICD-10-CM | POA: Diagnosis not present

## 2024-03-23 DIAGNOSIS — K649 Unspecified hemorrhoids: Secondary | ICD-10-CM | POA: Diagnosis not present

## 2024-03-23 DIAGNOSIS — I129 Hypertensive chronic kidney disease with stage 1 through stage 4 chronic kidney disease, or unspecified chronic kidney disease: Secondary | ICD-10-CM | POA: Diagnosis not present

## 2024-03-23 DIAGNOSIS — Z9889 Other specified postprocedural states: Secondary | ICD-10-CM | POA: Diagnosis not present

## 2024-03-23 DIAGNOSIS — N1831 Chronic kidney disease, stage 3a: Secondary | ICD-10-CM | POA: Diagnosis not present

## 2024-03-29 ENCOUNTER — Other Ambulatory Visit: Payer: Self-pay

## 2024-03-29 MED ORDER — METOPROLOL TARTRATE 25 MG PO TABS: 12.5000 mg | ORAL_TABLET | Freq: Two times a day (BID) | ORAL | 0 refills | Status: DC

## 2024-04-05 DIAGNOSIS — G4733 Obstructive sleep apnea (adult) (pediatric): Secondary | ICD-10-CM | POA: Diagnosis not present

## 2024-04-21 DIAGNOSIS — M7061 Trochanteric bursitis, right hip: Secondary | ICD-10-CM | POA: Diagnosis not present

## 2024-04-21 DIAGNOSIS — M5416 Radiculopathy, lumbar region: Secondary | ICD-10-CM | POA: Diagnosis not present

## 2024-05-05 DIAGNOSIS — M5416 Radiculopathy, lumbar region: Secondary | ICD-10-CM | POA: Diagnosis not present

## 2024-05-05 DIAGNOSIS — M6281 Muscle weakness (generalized): Secondary | ICD-10-CM | POA: Diagnosis not present

## 2024-05-05 DIAGNOSIS — M25551 Pain in right hip: Secondary | ICD-10-CM | POA: Diagnosis not present

## 2024-05-11 DIAGNOSIS — M6281 Muscle weakness (generalized): Secondary | ICD-10-CM | POA: Diagnosis not present

## 2024-05-11 DIAGNOSIS — M5416 Radiculopathy, lumbar region: Secondary | ICD-10-CM | POA: Diagnosis not present

## 2024-05-11 DIAGNOSIS — M25551 Pain in right hip: Secondary | ICD-10-CM | POA: Diagnosis not present

## 2024-05-13 DIAGNOSIS — M5416 Radiculopathy, lumbar region: Secondary | ICD-10-CM | POA: Diagnosis not present

## 2024-05-13 DIAGNOSIS — M25551 Pain in right hip: Secondary | ICD-10-CM | POA: Diagnosis not present

## 2024-05-13 DIAGNOSIS — M6281 Muscle weakness (generalized): Secondary | ICD-10-CM | POA: Diagnosis not present

## 2024-05-18 DIAGNOSIS — M25551 Pain in right hip: Secondary | ICD-10-CM | POA: Diagnosis not present

## 2024-05-18 DIAGNOSIS — M5416 Radiculopathy, lumbar region: Secondary | ICD-10-CM | POA: Diagnosis not present

## 2024-05-18 DIAGNOSIS — M6281 Muscle weakness (generalized): Secondary | ICD-10-CM | POA: Diagnosis not present

## 2024-05-23 DIAGNOSIS — M5416 Radiculopathy, lumbar region: Secondary | ICD-10-CM | POA: Diagnosis not present

## 2024-05-23 DIAGNOSIS — M6281 Muscle weakness (generalized): Secondary | ICD-10-CM | POA: Diagnosis not present

## 2024-05-23 DIAGNOSIS — M25551 Pain in right hip: Secondary | ICD-10-CM | POA: Diagnosis not present

## 2024-05-25 DIAGNOSIS — M25551 Pain in right hip: Secondary | ICD-10-CM | POA: Diagnosis not present

## 2024-05-25 DIAGNOSIS — M6281 Muscle weakness (generalized): Secondary | ICD-10-CM | POA: Diagnosis not present

## 2024-05-25 DIAGNOSIS — M5416 Radiculopathy, lumbar region: Secondary | ICD-10-CM | POA: Diagnosis not present

## 2024-05-30 DIAGNOSIS — M6281 Muscle weakness (generalized): Secondary | ICD-10-CM | POA: Diagnosis not present

## 2024-05-30 DIAGNOSIS — M5416 Radiculopathy, lumbar region: Secondary | ICD-10-CM | POA: Diagnosis not present

## 2024-05-30 DIAGNOSIS — M25551 Pain in right hip: Secondary | ICD-10-CM | POA: Diagnosis not present

## 2024-06-01 DIAGNOSIS — M6281 Muscle weakness (generalized): Secondary | ICD-10-CM | POA: Diagnosis not present

## 2024-06-01 DIAGNOSIS — M25551 Pain in right hip: Secondary | ICD-10-CM | POA: Diagnosis not present

## 2024-06-01 DIAGNOSIS — M5416 Radiculopathy, lumbar region: Secondary | ICD-10-CM | POA: Diagnosis not present

## 2024-06-06 DIAGNOSIS — M5416 Radiculopathy, lumbar region: Secondary | ICD-10-CM | POA: Diagnosis not present

## 2024-06-06 DIAGNOSIS — M25551 Pain in right hip: Secondary | ICD-10-CM | POA: Diagnosis not present

## 2024-06-06 DIAGNOSIS — M6281 Muscle weakness (generalized): Secondary | ICD-10-CM | POA: Diagnosis not present

## 2024-06-08 DIAGNOSIS — M5416 Radiculopathy, lumbar region: Secondary | ICD-10-CM | POA: Diagnosis not present

## 2024-06-08 DIAGNOSIS — M25551 Pain in right hip: Secondary | ICD-10-CM | POA: Diagnosis not present

## 2024-06-08 DIAGNOSIS — M6281 Muscle weakness (generalized): Secondary | ICD-10-CM | POA: Diagnosis not present

## 2024-06-13 DIAGNOSIS — M25551 Pain in right hip: Secondary | ICD-10-CM | POA: Diagnosis not present

## 2024-06-13 DIAGNOSIS — M6281 Muscle weakness (generalized): Secondary | ICD-10-CM | POA: Diagnosis not present

## 2024-06-13 DIAGNOSIS — M5416 Radiculopathy, lumbar region: Secondary | ICD-10-CM | POA: Diagnosis not present

## 2024-06-15 DIAGNOSIS — M6281 Muscle weakness (generalized): Secondary | ICD-10-CM | POA: Diagnosis not present

## 2024-06-15 DIAGNOSIS — M5416 Radiculopathy, lumbar region: Secondary | ICD-10-CM | POA: Diagnosis not present

## 2024-06-15 DIAGNOSIS — M25551 Pain in right hip: Secondary | ICD-10-CM | POA: Diagnosis not present

## 2024-06-20 DIAGNOSIS — M25551 Pain in right hip: Secondary | ICD-10-CM | POA: Diagnosis not present

## 2024-06-20 DIAGNOSIS — M6281 Muscle weakness (generalized): Secondary | ICD-10-CM | POA: Diagnosis not present

## 2024-06-20 DIAGNOSIS — M5416 Radiculopathy, lumbar region: Secondary | ICD-10-CM | POA: Diagnosis not present

## 2024-06-23 DIAGNOSIS — M5416 Radiculopathy, lumbar region: Secondary | ICD-10-CM | POA: Diagnosis not present

## 2024-06-23 DIAGNOSIS — M6281 Muscle weakness (generalized): Secondary | ICD-10-CM | POA: Diagnosis not present

## 2024-06-23 DIAGNOSIS — M25551 Pain in right hip: Secondary | ICD-10-CM | POA: Diagnosis not present

## 2024-06-25 ENCOUNTER — Other Ambulatory Visit: Payer: Self-pay | Admitting: Cardiology

## 2024-06-27 DIAGNOSIS — M6281 Muscle weakness (generalized): Secondary | ICD-10-CM | POA: Diagnosis not present

## 2024-06-27 DIAGNOSIS — M25551 Pain in right hip: Secondary | ICD-10-CM | POA: Diagnosis not present

## 2024-06-27 DIAGNOSIS — M5416 Radiculopathy, lumbar region: Secondary | ICD-10-CM | POA: Diagnosis not present

## 2024-06-30 DIAGNOSIS — M5416 Radiculopathy, lumbar region: Secondary | ICD-10-CM | POA: Diagnosis not present

## 2024-07-01 DIAGNOSIS — M6281 Muscle weakness (generalized): Secondary | ICD-10-CM | POA: Diagnosis not present

## 2024-07-01 DIAGNOSIS — M25551 Pain in right hip: Secondary | ICD-10-CM | POA: Diagnosis not present

## 2024-07-01 DIAGNOSIS — M5416 Radiculopathy, lumbar region: Secondary | ICD-10-CM | POA: Diagnosis not present

## 2024-07-05 DIAGNOSIS — M6281 Muscle weakness (generalized): Secondary | ICD-10-CM | POA: Diagnosis not present

## 2024-07-05 DIAGNOSIS — M25551 Pain in right hip: Secondary | ICD-10-CM | POA: Diagnosis not present

## 2024-07-05 DIAGNOSIS — M5416 Radiculopathy, lumbar region: Secondary | ICD-10-CM | POA: Diagnosis not present

## 2024-07-08 DIAGNOSIS — M5416 Radiculopathy, lumbar region: Secondary | ICD-10-CM | POA: Diagnosis not present

## 2024-07-08 DIAGNOSIS — M6281 Muscle weakness (generalized): Secondary | ICD-10-CM | POA: Diagnosis not present

## 2024-07-08 DIAGNOSIS — M25551 Pain in right hip: Secondary | ICD-10-CM | POA: Diagnosis not present

## 2024-07-11 DIAGNOSIS — M6281 Muscle weakness (generalized): Secondary | ICD-10-CM | POA: Diagnosis not present

## 2024-07-11 DIAGNOSIS — M5416 Radiculopathy, lumbar region: Secondary | ICD-10-CM | POA: Diagnosis not present

## 2024-07-11 DIAGNOSIS — M25551 Pain in right hip: Secondary | ICD-10-CM | POA: Diagnosis not present

## 2024-07-13 DIAGNOSIS — R350 Frequency of micturition: Secondary | ICD-10-CM | POA: Diagnosis not present

## 2024-07-13 DIAGNOSIS — R8271 Bacteriuria: Secondary | ICD-10-CM | POA: Diagnosis not present

## 2024-07-14 DIAGNOSIS — M6281 Muscle weakness (generalized): Secondary | ICD-10-CM | POA: Diagnosis not present

## 2024-07-14 DIAGNOSIS — M5416 Radiculopathy, lumbar region: Secondary | ICD-10-CM | POA: Diagnosis not present

## 2024-07-14 DIAGNOSIS — M25551 Pain in right hip: Secondary | ICD-10-CM | POA: Diagnosis not present

## 2024-07-18 DIAGNOSIS — M25551 Pain in right hip: Secondary | ICD-10-CM | POA: Diagnosis not present

## 2024-07-18 DIAGNOSIS — M6281 Muscle weakness (generalized): Secondary | ICD-10-CM | POA: Diagnosis not present

## 2024-07-18 DIAGNOSIS — M5416 Radiculopathy, lumbar region: Secondary | ICD-10-CM | POA: Diagnosis not present

## 2024-07-29 ENCOUNTER — Other Ambulatory Visit: Payer: Self-pay | Admitting: Cardiology

## 2024-07-30 NOTE — Progress Notes (Unsigned)
 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 is followed by Dr. Cave for the history outlined below.      Past medical history significant for: CAD. LHC 08/20/2022 (abnormal coronary CTA): Mid LM to ostial LAD 90%.  Ostial to proximal LCx 99%.  Proximal to mid LAD 30%.  Proximal RCA 85%.  Normal LV systolic function.  Mildly elevated LVEDP.  No significant gradient across aortic valve.  Recommend CABG for revascularization. CABG x 3 08/26/2022: LIMA to LAD, SVG to OM 2, SVG to RCA. Echo 09/09/2022: EF 55 to 60%.  No RWMA.  Grade I DD.  Normal RV size/function.  Normal PA pressure, RVSP 21.3 mmHg.  Mild MR.  Aortic valve sclerosis/calcification without stenosis. PAF. Onset postop CABG September 2023. 14-day ZIO 11/27/2022: HR 42 to 125 bpm, average 57 bpm.  Predominantly sinus rhythm.  Of SVT lasting 6 beats max rate 125 bpm.  Rare ectopy.  No evidence of A-fib. Hypertension. Hyperlipidemia.*** CVA. CKD.  In summary, patient underwent hospital admission November 2021 with acute left lacunar stroke.  Outpatient cardiac monitoring showed no evidence of A-fib or flutter.  Echo demonstrated normal LV function.  She was placed on Plavix  and Lipitor .    In early 2023 she developed an RSV infection which led to her being quite sedentary with associated fatigue, weakness, dyspnea with singing.  She was evaluated on 08/08/2022 for exertional dyspnea and chest discomfort.  She underwent coronary CTA which demonstrated calcium  score of 1329 (94th percentile), distal left main 25% stenosis, proximal LAD > 70% stenosis, proximal LCx 25 to 49% stenosis, aortic root/ascending aortic calcification.  FFR was positive within the distal left main, LAD and LCx.   She underwent LHC which demonstrated severe distal LM bifurcation disease as detailed above.  Given findings she was evaluated by CT surgery and underwent CABG x 3 on 08/26/2022.  Echo on 08/20/2022 demonstrated EF 60 to 65%, no RWMA, Grade I DD, normal RV size/function, mild MR, moderate MAC, aortic valve sclerosis without stenosis.  Pre-CABG vascular testing demonstrated normal ABIs bilaterally, 1 to 39% bilateral ICA stenosis.  Postop CABG she developed A-fib converted to sinus rhythm with amiodarone .  Patient was last seen in the office by by Dr. Cave on 06/05/2023 for preop risk assessment prior to back surgery.  She was felt to be an acceptable risk for surgery.     History of Present Illness    Today, patient ***  CAD S/p CABG x 24 August 2022.  Echo September 2023 showed normal LV/RV function, Grade I DD, normal PA pressure, mild MR, aortic valve sclerosis/calcification without stenosis.  Patient*** - Continue aspirin , atorvastatin , fenofibrate , metoprolol .  Postoperative A-fib Onset postop CABG September 2023.  14-day ZIO December 2023 with no evidence of A-fib.  Patient*** - Continue to monitor.  Hypertension BP today*** - Continue metoprolol .  Hyperlipidemia ***  ROS: All other systems reviewed and are otherwise negative except as noted in History of Present Illness.  EKGs/Labs Reviewed        02/26/2024: BUN 34; Creatinine, Ser 1.64; Potassium 4.2; Sodium 143   02/26/2024: Hemoglobin 11.8; WBC 6.4   No results found for requested labs within last 365 days.   No results found for requested  labs within last 365 days.  ***  Risk Assessment/Calculations    {Does this patient have ATRIAL FIBRILLATION?:[phone]} No BP recorded.  {Refresh Note OR Click here to enter BP  :1}***        Physical Exam    VS:  There were no vitals taken for this visit. , BMI There is no height or weight on file to calculate BMI.  GEN: Well nourished, well developed, in no acute  distress. Neck: No JVD or carotid bruits. Cardiac: *** RRR. *** No murmur. No rubs or gallops.   Respiratory:  Respirations regular and unlabored. Clear to auscultation without rales, wheezing or rhonchi. GI: Soft, nontender, nondistended. Extremities: Radials/DP/PT 2+ and equal bilaterally. No clubbing or cyanosis. No edema ***  Skin: Warm and dry, no rash. Neuro: Strength intact.  Assessment & Plan   ***  Disposition: ***     {Are you ordering a CV Procedure (e.g. stress test, cath, DCCV, TEE, etc)?   Press F2        :[id number]}   Signed, Barnie HERO. Almalik Weissberg, DNP, NP-C

## 2024-08-01 ENCOUNTER — Encounter: Payer: Self-pay | Admitting: Student

## 2024-08-01 ENCOUNTER — Ambulatory Visit: Attending: Student | Admitting: Student

## 2024-08-01 VITALS — BP 130/54 | HR 63 | Ht 62.0 in | Wt 180.2 lb

## 2024-08-01 DIAGNOSIS — R0609 Other forms of dyspnea: Secondary | ICD-10-CM | POA: Diagnosis not present

## 2024-08-01 DIAGNOSIS — E785 Hyperlipidemia, unspecified: Secondary | ICD-10-CM

## 2024-08-01 DIAGNOSIS — I4891 Unspecified atrial fibrillation: Secondary | ICD-10-CM

## 2024-08-01 DIAGNOSIS — R600 Localized edema: Secondary | ICD-10-CM

## 2024-08-01 DIAGNOSIS — I25810 Atherosclerosis of coronary artery bypass graft(s) without angina pectoris: Secondary | ICD-10-CM | POA: Diagnosis not present

## 2024-08-01 DIAGNOSIS — Z951 Presence of aortocoronary bypass graft: Secondary | ICD-10-CM

## 2024-08-01 DIAGNOSIS — I10 Essential (primary) hypertension: Secondary | ICD-10-CM | POA: Diagnosis not present

## 2024-08-01 DIAGNOSIS — I9789 Other postprocedural complications and disorders of the circulatory system, not elsewhere classified: Secondary | ICD-10-CM

## 2024-08-01 NOTE — Patient Instructions (Signed)
 Medication Instructions:  Your physician recommends that you continue on your current medications as directed. Please refer to the Current Medication list given to you today.   *If you need a refill on your cardiac medications before your next appointment, please call your pharmacy*  Lab Work: None ordered at this time  If you have labs (blood work) drawn today and your tests are completely normal, you will receive your results only by: MyChart Message (if you have MyChart) OR A paper copy in the mail If you have any lab test that is abnormal or we need to change your treatment, we will call you to review the results.  Testing/Procedures: Your physician has requested that you have an Echocardiogram. Echocardiography is a painless test that uses sound waves to create images of your heart. It provides your doctor with information about the size and shape of your heart and how well your heart's chambers and valves are working.   You may receive an ultrasound enhancing agent through an IV if needed to better visualize your heart during the echo. This procedure takes approximately one hour.  There are no restrictions for this procedure.  This will take place at [address] [facility] [address], [place] [address]  Please note: We ask at that you not bring children with you during ultrasound (echo/ vascular) testing. Due to room size and safety concerns, children are not allowed in the ultrasound rooms during exams. Our front office staff cannot provide observation of children in our lobby area while testing is being conducted. An adult accompanying a patient to their appointment will only be allowed in the ultrasound room at the discretion of the ultrasound technician under special circumstances. We apologize for any inconvenience.   Follow-Up: At [facility], you and your health needs are our priority.  As part of our continuing mission to provide you with exceptional heart  care, our providers are all part of one team.  This team includes your primary Cardiologist (physician) and Advanced Practice Providers or APPs (Physician Assistants and Nurse Practitioners) who all work together to provide you with the care you need, when you need it.  Your next appointment:   6 month(s)  Provider:   You may see Redell Cave, MD or one of the following Advanced Practice Providers on your designated Care Team:   Lonni Meager, NP Lesley Maffucci, PA-C Bernardino Bring, PA-C Cadence Galateo, PA-C Tylene Lunch, NP Barnie Hila, NP    We recommend signing up for the patient portal called MyChart.  Sign up information is provided on this After Visit Summary.  MyChart is used to connect with patients for Virtual Visits (Telemedicine).  Patients are able to view lab/test results, encounter notes, upcoming appointments, etc.  Non-urgent messages can be sent to your provider as well.   To learn more about what you can do with MyChart, go to [email or web address].

## 2024-08-23 ENCOUNTER — Ambulatory Visit

## 2024-08-23 DIAGNOSIS — R350 Frequency of micturition: Secondary | ICD-10-CM | POA: Diagnosis not present

## 2024-08-23 DIAGNOSIS — R3915 Urgency of urination: Secondary | ICD-10-CM | POA: Diagnosis not present

## 2024-08-25 DIAGNOSIS — I2510 Atherosclerotic heart disease of native coronary artery without angina pectoris: Secondary | ICD-10-CM | POA: Diagnosis not present

## 2024-08-25 DIAGNOSIS — N1832 Chronic kidney disease, stage 3b: Secondary | ICD-10-CM | POA: Diagnosis not present

## 2024-08-25 DIAGNOSIS — D509 Iron deficiency anemia, unspecified: Secondary | ICD-10-CM | POA: Diagnosis not present

## 2024-08-25 DIAGNOSIS — I129 Hypertensive chronic kidney disease with stage 1 through stage 4 chronic kidney disease, or unspecified chronic kidney disease: Secondary | ICD-10-CM | POA: Diagnosis not present

## 2024-08-25 DIAGNOSIS — E782 Mixed hyperlipidemia: Secondary | ICD-10-CM | POA: Diagnosis not present

## 2024-08-25 DIAGNOSIS — D229 Melanocytic nevi, unspecified: Secondary | ICD-10-CM | POA: Diagnosis not present

## 2024-08-26 ENCOUNTER — Encounter: Payer: Self-pay | Admitting: Student

## 2024-09-07 DIAGNOSIS — R350 Frequency of micturition: Secondary | ICD-10-CM | POA: Diagnosis not present

## 2024-09-23 DIAGNOSIS — L659 Nonscarring hair loss, unspecified: Secondary | ICD-10-CM | POA: Diagnosis not present

## 2024-09-23 DIAGNOSIS — Z23 Encounter for immunization: Secondary | ICD-10-CM | POA: Diagnosis not present

## 2024-09-29 ENCOUNTER — Other Ambulatory Visit

## 2024-10-26 ENCOUNTER — Ambulatory Visit
Admission: RE | Admit: 2024-10-26 | Discharge: 2024-10-26 | Disposition: A | Discharge status: Home / Self Care | Source: Ambulatory Visit | Attending: Internal Medicine | Admitting: Internal Medicine

## 2024-10-26 ENCOUNTER — Other Ambulatory Visit

## 2024-10-26 DIAGNOSIS — M8589 Other specified disorders of bone density and structure, multiple sites: Secondary | ICD-10-CM | POA: Insufficient documentation

## 2024-11-10 ENCOUNTER — Other Ambulatory Visit: Payer: Self-pay | Admitting: Student

## 2024-11-10 ENCOUNTER — Ambulatory Visit: Attending: Student

## 2024-11-10 DIAGNOSIS — I10 Essential (primary) hypertension: Secondary | ICD-10-CM

## 2024-11-10 DIAGNOSIS — R600 Localized edema: Secondary | ICD-10-CM

## 2024-11-10 DIAGNOSIS — Z951 Presence of aortocoronary bypass graft: Secondary | ICD-10-CM

## 2024-11-10 DIAGNOSIS — E785 Hyperlipidemia, unspecified: Secondary | ICD-10-CM

## 2024-11-10 DIAGNOSIS — I25810 Atherosclerosis of coronary artery bypass graft(s) without angina pectoris: Secondary | ICD-10-CM

## 2024-11-10 DIAGNOSIS — R0609 Other forms of dyspnea: Secondary | ICD-10-CM

## 2024-11-10 DIAGNOSIS — I4891 Unspecified atrial fibrillation: Secondary | ICD-10-CM

## 2024-11-10 LAB — ECHOCARDIOGRAM COMPLETE
AR max vel: 0.96 cm2
AV Area VTI: 1.07 cm2
AV Area mean vel: 0.97 cm2
AV Mean grad: 8 mmHg
AV Peak grad: 14.1 mmHg
Ao pk vel: 1.88 m/s
Area-P 1/2: 4.31 cm2
S' Lateral: 3.6 cm

## 2024-11-11 ENCOUNTER — Ambulatory Visit: Payer: Self-pay | Admitting: Student
